# Patient Record
Sex: Male | Born: 1950
Health system: Southern US, Community
[De-identification: ages and names within clinical notes are randomized; demographics above are authoritative.]

## PROBLEM LIST (undated history)

## (undated) ENCOUNTER — Emergency Department: Discharge status: Absent without leave | Payer: Medicare (Managed Care)

## (undated) DIAGNOSIS — N189 Chronic kidney disease, unspecified: Secondary | ICD-10-CM

## (undated) DIAGNOSIS — L84 Corns and callosities: Secondary | ICD-10-CM

## (undated) DIAGNOSIS — E278 Other specified disorders of adrenal gland: Secondary | ICD-10-CM

## (undated) DIAGNOSIS — C61 Malignant neoplasm of prostate: Secondary | ICD-10-CM

## (undated) DIAGNOSIS — Z8619 Personal history of other infectious and parasitic diseases: Secondary | ICD-10-CM

## (undated) DIAGNOSIS — I10 Essential (primary) hypertension: Secondary | ICD-10-CM

## (undated) DIAGNOSIS — N182 Chronic kidney disease, stage 2 (mild): Secondary | ICD-10-CM

## (undated) DIAGNOSIS — E785 Hyperlipidemia, unspecified: Secondary | ICD-10-CM

## (undated) DIAGNOSIS — E119 Type 2 diabetes mellitus without complications: Secondary | ICD-10-CM

## (undated) DIAGNOSIS — E669 Obesity, unspecified: Secondary | ICD-10-CM

## (undated) DIAGNOSIS — E279 Disorder of adrenal gland, unspecified: Secondary | ICD-10-CM

## (undated) DIAGNOSIS — R319 Hematuria, unspecified: Secondary | ICD-10-CM

## (undated) DIAGNOSIS — R7303 Prediabetes: Secondary | ICD-10-CM

## (undated) DIAGNOSIS — R972 Elevated prostate specific antigen [PSA]: Secondary | ICD-10-CM

## (undated) DIAGNOSIS — R310 Gross hematuria: Secondary | ICD-10-CM

## (undated) HISTORY — DX: Other specified disorders of adrenal gland: E27.8

## (undated) HISTORY — DX: Gross hematuria: R31.0

## (undated) HISTORY — DX: Hyperlipidemia, unspecified: E78.5

## (undated) HISTORY — DX: Malignant neoplasm of prostate: C61

## (undated) HISTORY — PX: TONSILLECTOMY: SUR1361

## (undated) HISTORY — PX: ADENOIDECTOMY: SUR15

## (undated) HISTORY — DX: Prediabetes: R73.03

## (undated) HISTORY — DX: Elevated prostate specific antigen (PSA): R97.20

## (undated) HISTORY — DX: Chronic kidney disease, unspecified: N18.9

## (undated) HISTORY — DX: Essential (primary) hypertension: I10

## (undated) HISTORY — DX: Disorder of adrenal gland, unspecified: E27.9

## (undated) HISTORY — DX: Type 2 diabetes mellitus without complications: E11.9

## (undated) HISTORY — DX: Personal history of other infectious and parasitic diseases: Z86.19

## (undated) HISTORY — DX: Obesity, unspecified: E66.9

## (undated) HISTORY — DX: Chronic kidney disease, stage 2 (mild): N18.2

---
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
## 2004-01-01 ENCOUNTER — Other Ambulatory Visit: Payer: Self-pay

## 2005-01-21 ENCOUNTER — Emergency Department: Payer: Self-pay | Admitting: General Practice

## 2006-08-16 ENCOUNTER — Ambulatory Visit: Payer: Self-pay | Admitting: Gastroenterology

## 2014-04-19 HISTORY — PX: PROCTOSCOPY: SHX2266

## 2014-04-28 ENCOUNTER — Ambulatory Visit: Payer: Self-pay | Admitting: Obstetrics and Gynecology

## 2014-04-28 IMAGING — CT CT ABDOMEN AND PELVIS WITHOUT AND WITH CONTRAST
3 of 10 series · 13 of 46 positions shown, 19 images · IV contrast (isovue)
Comparison: None.

CLINICAL DATA: Gross hematuria.

EXAM:
CT ABDOMEN AND PELVIS WITHOUT AND WITH CONTRAST
TECHNIQUE: Multidetector CT imaging of the abdomen and pelvis was performed
following the standard protocol before and following the bolus
administration of intravenous contrast.
CONTRAST:  125 cc Isovue 370.

[Series 2: hematuria > 45 wo · axial · 0.80mm/px · z∈[-1002,-642]mm · 8 of 94 slices shown, 13 images]
[im 11/94  soft-tissue]
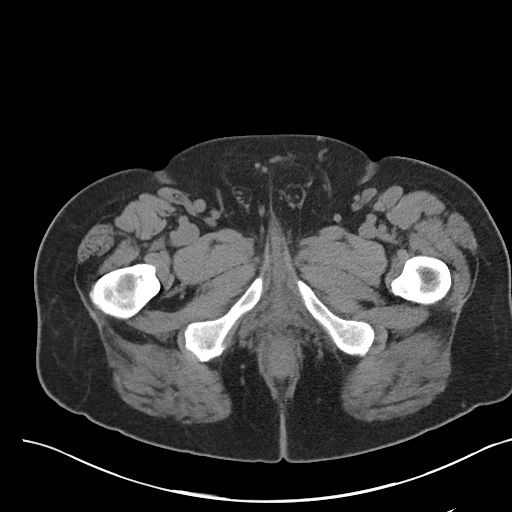
[im 11/94  bone]
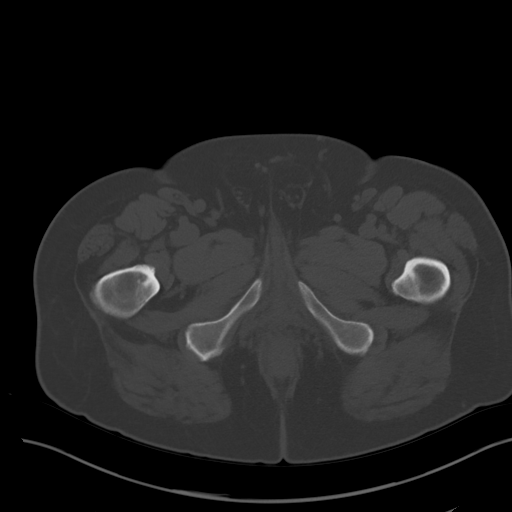
[im 21/94  soft-tissue]
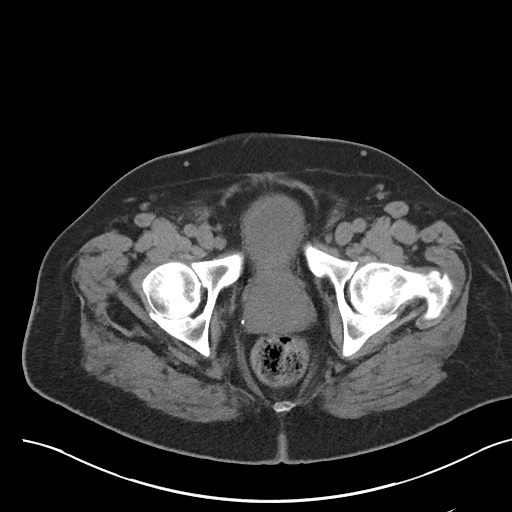
[im 32/94  soft-tissue]
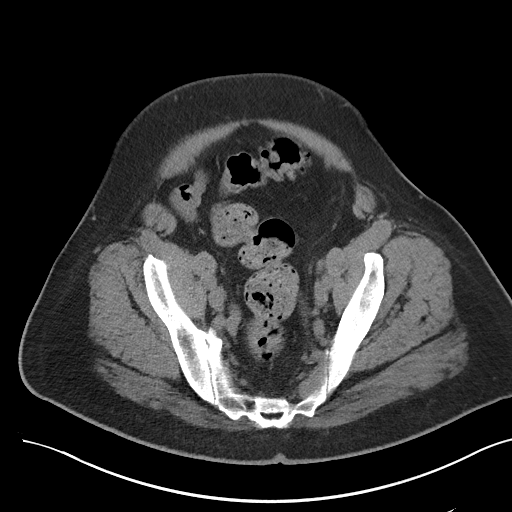
[im 42/94  soft-tissue]
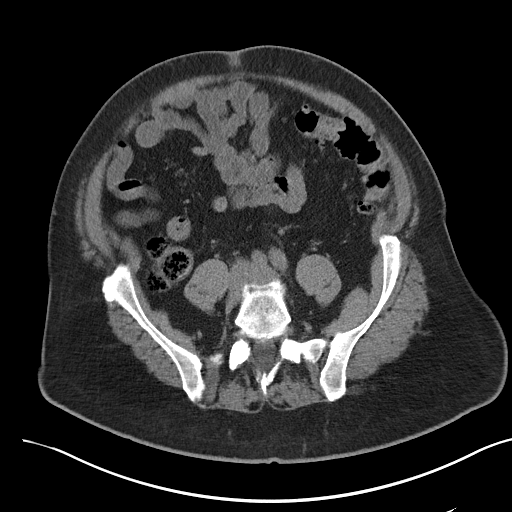
[im 52/94  soft-tissue]
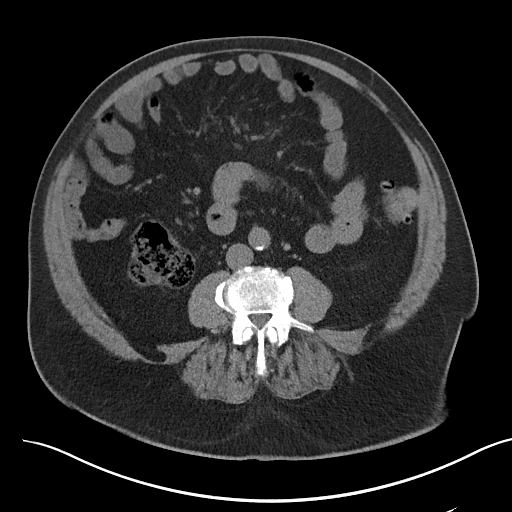
[im 52/94  lung]
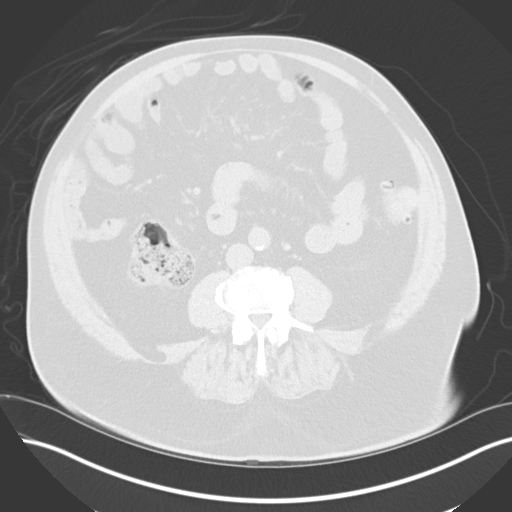
[im 63/94  soft-tissue]
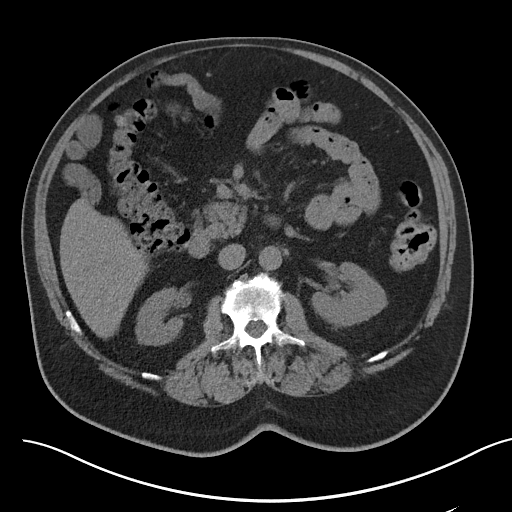
[im 63/94  lung]
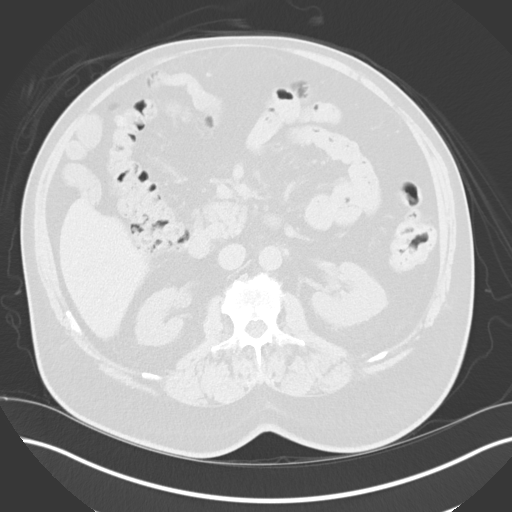
[im 73/94  soft-tissue]
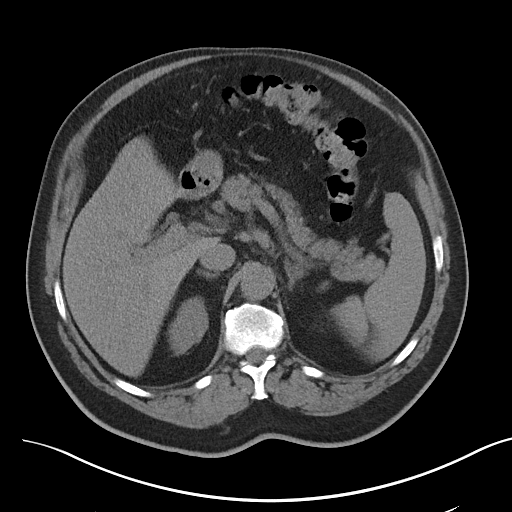
[im 73/94  lung]
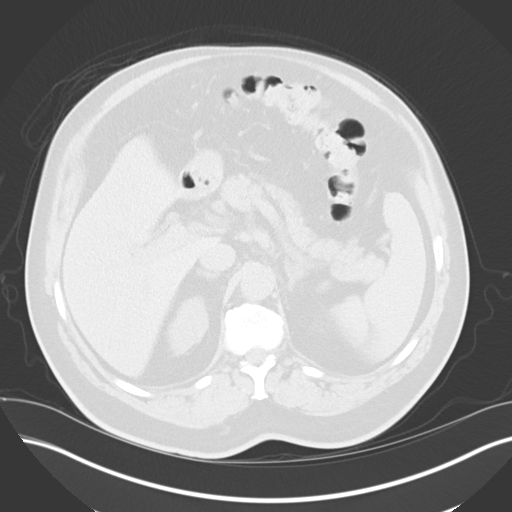
[im 83/94  soft-tissue]
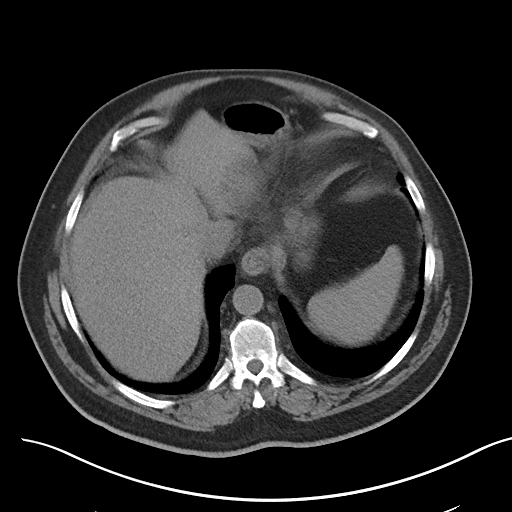
[im 83/94  lung]
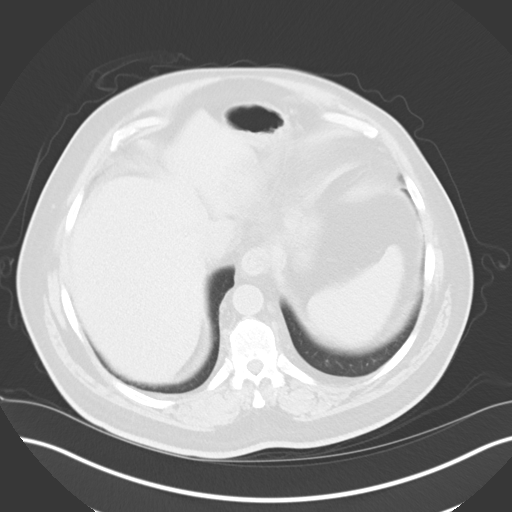

[Series 7: delay · axial · delayed · 0.98mm/px · z∈[-976,-866]mm · 3 of 100 slices shown]
[im 12/100  soft-tissue]
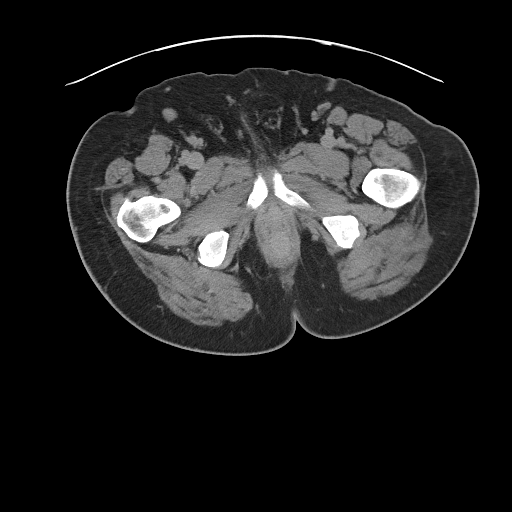
[im 23/100  soft-tissue]
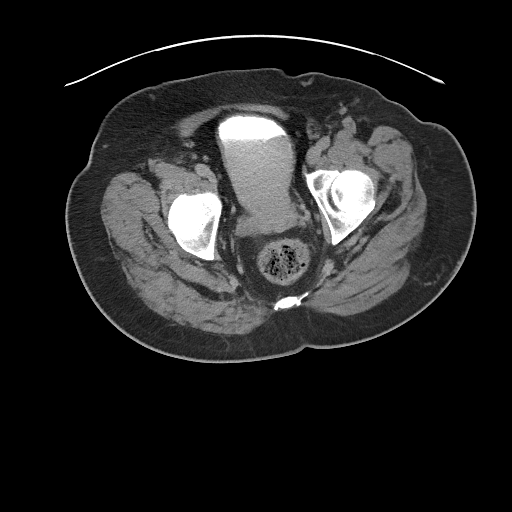
[im 34/100  soft-tissue]
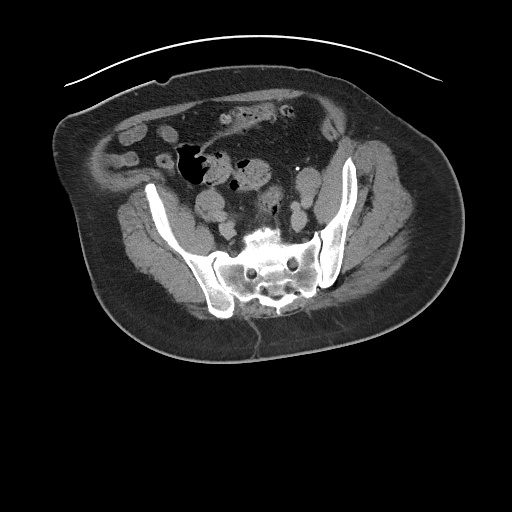

[Series 14: cor delay · coronal · delayed · 0.92mm/px · 2 of 191 slices shown, 3 images]
[im 64/191  soft-tissue]
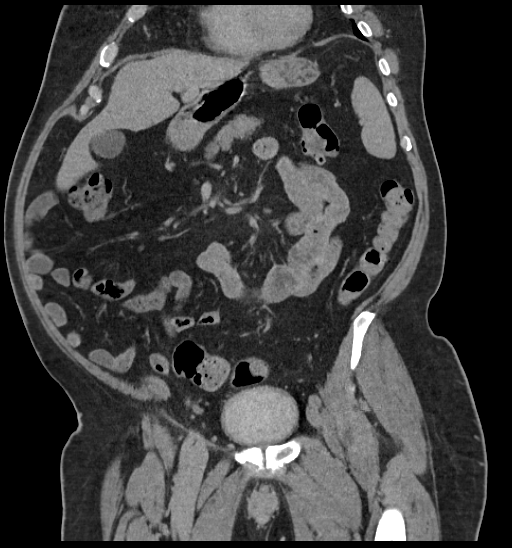
[im 64/191  bone]
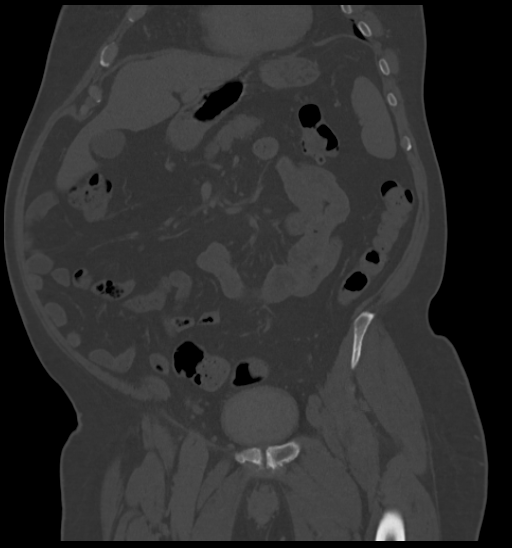
[im 127/191  soft-tissue]
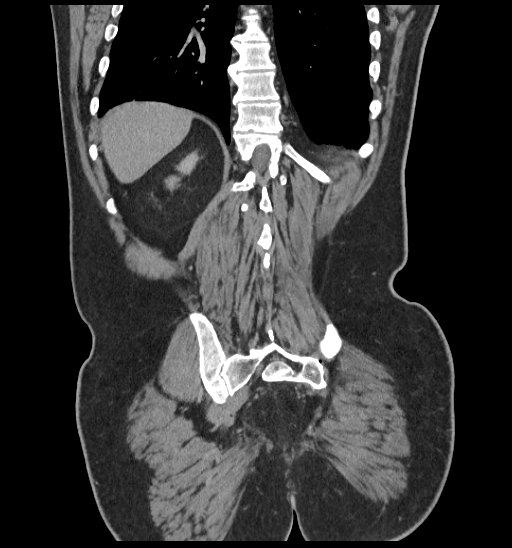

[13 of 46 positions shown; findings below may reference images not displayed]

FINDINGS: Lung bases: Lung bases show no acute findings. Heart size normal. No
pericardial or pleural effusion.

Kidneys / Ureters / Bladder: No urinary stones or hydronephrosis.
The majority of the right ureter and distal left ureter are poorly
opacified, limiting evaluation. Otherwise, no filling defects in the
renal collecting systems. Bladder is unremarkable.

Other: Millimetric low-attenuation lesions in the dome of the liver
too small to characterize but likely represent cysts. Liver is
otherwise unremarkable. Small stone layers in the gallbladder. Right
adrenal gland is unremarkable. A 0.8 x 1.2 cm nodule in the left
adrenal gland measures fluid in density on precontrast imaging.
Spleen, pancreas, stomach and bowel are unremarkable. Prostate is
mildly enlarged and indents the bladder base. Small bilateral
inguinal hernias contain fat. No pathologically enlarged lymph
nodes. No free fluid. Minimal scattered atherosclerotic
calcification of the arterial vasculature.

Bones / Musculoskeletal: No worrisome lytic or sclerotic lesions.
Degenerative changes are seen in the spine.
IMPRESSION: 1. Portions of the ureters are poorly opacified, limiting
evaluation. Otherwise, no findings to explain the patient's
hematuria.
2. Cholelithiasis.
3. Left adrenal adenoma.
4. Enlarged prostate.

## 2014-08-09 ENCOUNTER — Emergency Department: Payer: Self-pay | Admitting: Emergency Medicine

## 2014-08-09 LAB — CBC WITH DIFFERENTIAL/PLATELET
BASOS PCT: 0.4 %
Basophil #: 0.1 10*3/uL (ref 0.0–0.1)
Eosinophil #: 0.2 10*3/uL (ref 0.0–0.7)
Eosinophil %: 1.9 %
HCT: 41.7 % (ref 40.0–52.0)
HGB: 13.8 g/dL (ref 13.0–18.0)
Lymphocyte #: 2 10*3/uL (ref 1.0–3.6)
Lymphocyte %: 17.5 %
MCH: 29.5 pg (ref 26.0–34.0)
MCHC: 33.1 g/dL (ref 32.0–36.0)
MCV: 89 fL (ref 80–100)
MONOS PCT: 5.4 %
Monocyte #: 0.6 x10 3/mm (ref 0.2–1.0)
NEUTROS PCT: 74.8 %
Neutrophil #: 8.7 10*3/uL — ABNORMAL HIGH (ref 1.4–6.5)
PLATELETS: 222 10*3/uL (ref 150–440)
RBC: 4.68 10*6/uL (ref 4.40–5.90)
RDW: 13.5 % (ref 11.5–14.5)
WBC: 11.6 10*3/uL — AB (ref 3.8–10.6)

## 2014-08-09 LAB — URINALYSIS, COMPLETE
BACTERIA: NONE SEEN
RBC,UR: 84892 /HPF (ref 0–5)
SPECIFIC GRAVITY: 1.027 (ref 1.003–1.030)
SQUAMOUS EPITHELIAL: NONE SEEN
WBC UR: NONE SEEN /HPF (ref 0–5)

## 2014-11-18 ENCOUNTER — Ambulatory Visit: Payer: Self-pay | Admitting: Gastroenterology

## 2014-11-18 HISTORY — PX: COLONOSCOPY: SHX5424

## 2015-01-12 LAB — SURGICAL PATHOLOGY

## 2015-04-14 ENCOUNTER — Other Ambulatory Visit: Payer: Self-pay | Admitting: Family Medicine

## 2015-04-14 DIAGNOSIS — E119 Type 2 diabetes mellitus without complications: Secondary | ICD-10-CM

## 2015-05-06 ENCOUNTER — Encounter: Payer: Self-pay | Admitting: *Deleted

## 2015-05-08 ENCOUNTER — Ambulatory Visit (INDEPENDENT_AMBULATORY_CARE_PROVIDER_SITE_OTHER): Payer: Managed Care, Other (non HMO) | Admitting: Family Medicine

## 2015-05-08 ENCOUNTER — Encounter: Payer: Self-pay | Admitting: Family Medicine

## 2015-05-08 VITALS — BP 132/86 | HR 102 | Temp 97.8°F | Resp 18 | Ht 69.0 in | Wt 207.4 lb

## 2015-05-08 DIAGNOSIS — R972 Elevated prostate specific antigen [PSA]: Secondary | ICD-10-CM

## 2015-05-08 DIAGNOSIS — Z8619 Personal history of other infectious and parasitic diseases: Secondary | ICD-10-CM

## 2015-05-08 DIAGNOSIS — D3500 Benign neoplasm of unspecified adrenal gland: Secondary | ICD-10-CM | POA: Insufficient documentation

## 2015-05-08 DIAGNOSIS — N182 Chronic kidney disease, stage 2 (mild): Secondary | ICD-10-CM | POA: Insufficient documentation

## 2015-05-08 DIAGNOSIS — M25511 Pain in right shoulder: Secondary | ICD-10-CM | POA: Insufficient documentation

## 2015-05-08 DIAGNOSIS — R319 Hematuria, unspecified: Secondary | ICD-10-CM | POA: Insufficient documentation

## 2015-05-08 DIAGNOSIS — E785 Hyperlipidemia, unspecified: Secondary | ICD-10-CM

## 2015-05-08 DIAGNOSIS — E8881 Metabolic syndrome: Secondary | ICD-10-CM | POA: Diagnosis not present

## 2015-05-08 DIAGNOSIS — E669 Obesity, unspecified: Secondary | ICD-10-CM | POA: Diagnosis not present

## 2015-05-08 DIAGNOSIS — I10 Essential (primary) hypertension: Secondary | ICD-10-CM | POA: Diagnosis not present

## 2015-05-08 DIAGNOSIS — R809 Proteinuria, unspecified: Secondary | ICD-10-CM | POA: Insufficient documentation

## 2015-05-08 DIAGNOSIS — E663 Overweight: Secondary | ICD-10-CM | POA: Insufficient documentation

## 2015-05-08 DIAGNOSIS — E119 Type 2 diabetes mellitus without complications: Secondary | ICD-10-CM | POA: Diagnosis not present

## 2015-05-08 DIAGNOSIS — E66811 Obesity, class 1: Secondary | ICD-10-CM

## 2015-05-08 DIAGNOSIS — K625 Hemorrhage of anus and rectum: Secondary | ICD-10-CM | POA: Insufficient documentation

## 2015-05-08 HISTORY — DX: Hematuria, unspecified: R31.9

## 2015-05-08 HISTORY — DX: Personal history of other infectious and parasitic diseases: Z86.19

## 2015-05-08 LAB — POCT GLYCOSYLATED HEMOGLOBIN (HGB A1C): Hemoglobin A1C: 6.2

## 2015-05-08 MED ORDER — METFORMIN HCL 500 MG PO TABS: 500.0000 mg | ORAL_TABLET | Freq: Every day | ORAL | Status: DC

## 2015-05-08 NOTE — Progress Notes (Signed)
Name: SPIKE DESILETS   MRN: [id number]    DOB: 1951/02/28   Date:05/08/2015       Progress Note  Subjective  Chief Complaint  Chief Complaint  Patient presents with  . Diabetes    HPI   Patient is here for routine follow up of Diabetes Type II.  First diagnosed with DM Type II several years ago. Current diabetes medication regimen includes Metformin 500 mg one a day. Patient is taking medications as instructed. Overall the patient feels that their blood glucose is well controlled. Checking blood glucose zero times a day/week consistently/inconsistently. No symptoms of hypoglycemia are reported today. Fasting glucose range unknown. Meal time glucose range unknown. Lowest glucose result unknown. Related symptoms include none.  Related medical diagnosis include HLD, Obesity. The last retinal eye exam was unknown.  Hyperlipidemia: Patient presents with hyperlipidemia.  He was tested because co morbid conditions such as DM II, Obesity. There is a family history of hyperlipidemia. There is not a family history of early ischemia heart disease.  He is not on statin therapy as he is working on lifestyle changes.  Hypertension: Patient here for follow-up of elevated blood pressure. He is not exercising and is adherent to low salt diet.  Blood pressure is well controlled at home. Cardiac symptoms none. Patient denies chest pain, claudication, exertional chest pressure/discomfort, irregular heart beat, lower extremity edema and palpitations.  Cardiovascular risk factors: advanced age (older than 54 for men, 37 for women), diabetes mellitus, hypertension, male gender, microalbuminuria, obesity (BMI >= 30 kg/m2) and sedentary lifestyle. Use of agents associated with hypertension: NSAIDS. History of target organ damage: chronic kidney disease.   Patient is here for routine follow up of Obesity. The patient is appropriately concerned about their weight. Onset of weight issues started many years ago. Highest  recorded weight 210 lbs. Associated symptoms or diseases do not include pain in weight bearing joints, HLD, skin lesions, depression, poor self esteem, snoring, sleep apnea. Current efforts to reduce weight include low fat, low carbohydrate, low sugar diet along with daily exercise.  Patient is consistent with recommended lifestyle changes.     Past Medical History  Diagnosis Date  . Borderline diabetes   . Obesity   . HTN (hypertension)   . Elevated PSA   . Gross hematuria   . Prostate cancer   . Adrenal nodule   . Diabetes mellitus without complication   . Hyperlipidemia   . Chronic kidney disease   . History of hepatitis B   . CKD (chronic kidney disease), stage II   . HTN, goal below 150/90   . Hyperlipidemia with target LDL less than 100     Past Surgical History  Procedure Laterality Date  . Adenoidectomy    . Tonsillectomy    . Proctoscopy  04/2014  . Colonoscopy  11/18/2014    Family History  Problem Relation Age of Onset  . Parkinson's disease Brother   . Heart disease Mother   . Heart disease Father   . Diabetes Mellitus II Mother   . Coronary artery disease Mother     Social History   Social History  . Marital Status: Single    Spouse Name: N/A  . Number of Children: N/A  . Years of Education: N/A   Occupational History  . Not on file.   Social History Main Topics  . Smoking status: Never Smoker   . Smokeless tobacco: Not on file  . Alcohol Use: No  . Drug Use: Not  on file  . Sexual Activity: No   Other Topics Concern  . Not on file   Social History Narrative     Current outpatient prescriptions:  .  metFORMIN (GLUCOPHAGE) 500 MG tablet, Take 1 tablet (500 mg total) by mouth daily., Disp: 90 tablet, Rfl: 3  No Known Allergies   ROS  CONSTITUTIONAL: No significant weight changes, fever, chills, weakness or fatigue.  HEENT:  - Eyes: No visual changes.  - Ears: No auditory changes. No pain.  - Nose: No sneezing, congestion, runny  nose. - Throat: No sore throat. No changes in swallowing. SKIN: No rash or itching.  CARDIOVASCULAR: No chest pain, chest pressure or chest discomfort. No palpitations or edema.  RESPIRATORY: No shortness of breath, cough or sputum.  GASTROINTESTINAL: No anorexia, nausea, vomiting. No changes in bowel habits. No abdominal pain or blood with bowel movements.  GENITOURINARY: No dysuria. No frequency. No discharge.  NEUROLOGICAL: No headache, dizziness, syncope, paralysis, ataxia.  No numbness, pain or tingling in the extremities. No memory changes. No change in bowel or bladder control.  MUSCULOSKELETAL: No joint pain. No muscle pain. HEMATOLOGIC: No anemia, bleeding or bruising.  LYMPHATICS: No enlarged lymph nodes.  PSYCHIATRIC: No change in mood. No change in sleep pattern.  ENDOCRINOLOGIC: No reports of sweating, cold or heat intolerance. No polyuria or polydipsia.   Objective  Filed Vitals:   05/08/15 1553  BP: 132/86  Pulse: 102  Temp: 97.8 F (36.6 C)  TempSrc: Oral  Resp: 18  Height: _0  (1.753 m)  Weight: 207 lb 6.4 oz (94.076 kg)  SpO2: 97%   Body mass index is 30.61 kg/(m^2).  Physical Exam  Constitutional: Patient is obese and well-nourished. In no distress.  HEENT:  - Head: Normocephalic and atraumatic.  - Ears: Bilateral TMs gray, no erythema or effusion - Nose: Nasal mucosa moist - Mouth/Throat: Oropharynx is clear and moist. No tonsillar hypertrophy or erythema. No post nasal drainage. Poor dentition. - Eyes: Conjunctivae clear, EOM movements normal. PERRLA. No scleral icterus.  Neck: Normal range of motion. Neck supple. No JVD present. No thyromegaly present.  Cardiovascular: Normal rate, regular rhythm and normal heart sounds.  No murmur heard.  Pulmonary/Chest: Effort normal and breath sounds normal. No respiratory distress. Abdomen: Soft and obese with waist circumference over 40 inches (male), non tender, bowel sounds normal in all quadrants, difficult  examining internal organs due to body habitus Musculoskeletal: Normal range of motion bilateral UE and LE, no joint effusions. Peripheral vascular: Bilateral LE no edema. Neurological: CN II-XII grossly intact with no focal deficits. Alert and oriented to person, place, and time. Coordination, balance, strength, speech and gait are normal.  Skin: Skin is warm and dry. No rash noted. No erythema.  Psychiatric: Patient has a normal mood and affect. Behavior is normal in office today. Judgment and thought content normal in office today.   Diabetic Foot Exam - Simple   Simple Foot Form  Diabetic Foot exam was performed with the following findings:  Yes 05/08/2015  4:27 PM  Visual Inspection  See comments:  Yes  Sensation Testing  Intact to touch and monofilament testing bilaterally:  Yes  Pulse Check  Posterior Tibialis and Dorsalis pulse intact bilaterally:  Yes  Comments  Thick rigid long nails with lateral deviation of toes. No ulcerations or calluses.      Results for orders placed or performed in visit on 05/08/15 (from the past 48 hour(s))  POCT glycosylated hemoglobin (Hb A1C)  Status: Normal   Collection Time: 05/08/15  4:03 PM  Result Value Ref Range   Hemoglobin A1C 6.2     Assessment & Plan  1. Diabetes mellitus type 2, controlled, without complications Patient's EAT3V goal is <6.5% for stringent control, goal met today.  Encouraged patient to continue efforts on checking blood glucose on a daily basis. Fasting blood glucose control goal is 80-137m/dL and post prandial blood glucose control is <18107mdL.  Reviewed diet, exercise, lifestyle changes and current medication regimen pertaining to diabetes with the patient.   Reminded patient of the required annual dilated retinal exam. Unable to perform urine micro albumin today due to being out of stock.  - POCT glycosylated hemoglobin (Hb A1C) - Ambulatory referral to Ophthalmology - metFORMIN (GLUCOPHAGE) 500 MG  tablet; Take 1 tablet (500 mg total) by mouth daily.  Dispense: 90 tablet; Refill: 3  2. Hypertension, goal below 140/90 Clinically stable findings based on clinical exam and on review of any pertinent results. Recommended to patient that they continue their current regimen with regular follow ups.   3. Metabolic syndrome Metabolic syndrome as evidenced by meeting criteria outlined by NCEP ATP III definition, metabolic syndrome is present if three or more of the following five criteria are met: waist circumference over 40 inches (men) or 35 inches (women), blood pressure over 130/85 mmHg, fasting triglyceride (TG) level over 150 mg/dl, fasting high-density lipoprotein (HDL) cholesterol level less than 40 mg/dl (men) or 50 mg/dl (women) and fasting blood sugar over 100 mg/dl.   4. Abnormal prostate specific antigen Negative for malignancy thus far, due to financial restrictions patient has decided to not follow up with Urology but will surveillance PSA with PCP office and if there is a change will see specialist.   5. Obesity, Class I, BMI 30-34.9 The patient has been counseled on their higher than normal BMI.  They have verbally expressed understanding their increased risk for other diseases.  In efforts to meet a better target BMI goal the patient has been counseled on lifestyle, diet and exercise modification tactics. Start with moderate intensity aerobic exercise (walking, jogging, elliptical, swimming, group or individual sports, hiking) at least 306m a day at least 4 days a week and increase intensity, duration, frequency as tolerated. Diet should include well balance fresh fruits and vegetables avoiding processed foods, carbohydrates and sugars. Drink at least 8oz 10 glasses a day avoiding sodas, sugary fruit drinks, sweetened tea. Check weight on a reliable scale daily and monitor weight loss progress daily. Consider investing in mobile phone apps that will help keep track of weight loss  goals.   6. Hyperlipidemia LDL goal <100 Reviewed most recent fasting lipid profile blood work. Discussed cholesterol goals with patient. Discussed treatment options including diet, exercise, lifestyle changes, statin therapy, fenofibrate, bile acid resins, omega3 fatty acids. Patient educated on long term consequences of uncontrolled hyperlipidemia.

## 2015-05-08 NOTE — Progress Notes (Deleted)
Name: Scott Woods   MRN: [id number]    DOB: Feb 11, 1951   Date:05/08/2015       Progress Note  Subjective  Chief Complaint  No chief complaint on file.   HPI  ***  Patient Active Problem List   Diagnosis Date Noted  . Adrenal adenoma 05/08/2015  . Chronic kidney disease (CKD), stage II (mild) 05/08/2015  . Abnormal prostate specific antigen 05/08/2015  . Blood in the urine 05/08/2015  . H/O type B viral hepatitis 05/08/2015  . Hyperlipidemia LDL goal <100 05/08/2015  . Hypertension, goal below 140/90 05/08/2015  . Abnormal presence of protein in urine 05/08/2015  . Bleeding per rectum 05/08/2015  . Diabetes mellitus type 2, controlled, without complications [id number]  . Obesity, Class I, BMI 30-34.9 05/08/2015    Social History  Substance Use Topics  . Smoking status: Never Smoker   . Smokeless tobacco: Not on file  . Alcohol Use: No     Current outpatient prescriptions:  .  metFORMIN (GLUCOPHAGE) 500 MG tablet, TAKE 1 TABLET BY MOUTH EVERY DAY, Disp: 30 tablet, Rfl: 2  Past Surgical History  Procedure Laterality Date  . Adenoidectomy    . Tonsillectomy      Family History  Problem Relation Age of Onset  . Parkinson's disease Brother   . Heart disease Mother   . Heart disease Father   . Diabetes Mellitus II Mother   . Coronary artery disease Mother     Allergies no known allergies   Review of Systems  CONSTITUTIONAL: No significant weight changes, fever, chills, weakness or fatigue.  HEENT:  - Eyes: No visual changes.  - Ears: No auditory changes. No pain.  - Nose: No sneezing, congestion, runny nose. - Throat: No sore throat. No changes in swallowing. SKIN: No rash or itching.  CARDIOVASCULAR: No chest pain, chest pressure or chest discomfort. No palpitations or edema.  RESPIRATORY: No shortness of breath, cough or sputum.  GASTROINTESTINAL: No anorexia, nausea, vomiting. No changes in bowel habits. No abdominal pain or blood.  GENITOURINARY: No  dysuria. No frequency. No discharge. *** NEUROLOGICAL: No headache, dizziness, syncope, paralysis, ataxia, numbness or tingling in the extremities. No memory changes. No change in bowel or bladder control.  MUSCULOSKELETAL: No joint pain. No muscle pain. HEMATOLOGIC: No anemia, bleeding or bruising.  LYMPHATICS: No enlarged lymph nodes.  PSYCHIATRIC: No change in mood. No change in sleep pattern.  ENDOCRINOLOGIC: No reports of sweating, cold or heat intolerance. No polyuria or polydipsia.     Objective  There were no vitals taken for this visit. There is no height or weight on file to calculate BMI.  Physical Exam  Constitutional: Patient appears well-developed and well-nourished. In no distress.  HEENT:  - Head: Normocephalic and atraumatic.  - Ears: Bilateral TMs gray, no erythema or effusion - Nose: Nasal mucosa moist - Mouth/Throat: Oropharynx is clear and moist. No tonsillar hypertrophy or erythema. No post nasal drainage.  - Eyes: Conjunctivae clear, EOM movements normal. PERRLA. No scleral icterus.  Neck: Normal range of motion. Neck supple. No JVD present. No thyromegaly present.  Cardiovascular: Normal rate, regular rhythm and normal heart sounds.  No murmur heard.  Pulmonary/Chest: Effort normal and breath sounds normal. No respiratory distress. Musculoskeletal: Normal range of motion bilateral UE and LE, no joint effusions. Peripheral vascular: Bilateral LE no edema. Neurological: CN II-XII grossly intact with no focal deficits. Alert and oriented to person, place, and time. Coordination, balance, strength, speech and gait are normal.  Skin: Skin is warm and dry. No rash noted. No erythema.  Psychiatric: Patient has a normal mood and affect. Behavior is normal in office today. Judgment and thought content normal in office today.   No results found for this or any previous visit (from the past 2160 hour(s)).   Assessment & Plan

## 2015-05-11 ENCOUNTER — Other Ambulatory Visit: Payer: Self-pay

## 2015-05-14 ENCOUNTER — Ambulatory Visit: Payer: Self-pay | Admitting: Urology

## 2015-09-15 ENCOUNTER — Ambulatory Visit: Payer: Managed Care, Other (non HMO) | Admitting: Family Medicine

## 2015-10-09 ENCOUNTER — Ambulatory Visit (INDEPENDENT_AMBULATORY_CARE_PROVIDER_SITE_OTHER): Payer: Managed Care, Other (non HMO) | Admitting: Family Medicine

## 2015-10-09 ENCOUNTER — Encounter: Payer: Self-pay | Admitting: Family Medicine

## 2015-10-09 VITALS — BP 118/74 | HR 90 | Temp 98.3°F | Resp 16 | Ht 69.0 in | Wt 208.9 lb

## 2015-10-09 DIAGNOSIS — E8881 Metabolic syndrome: Secondary | ICD-10-CM | POA: Insufficient documentation

## 2015-10-09 DIAGNOSIS — E119 Type 2 diabetes mellitus without complications: Secondary | ICD-10-CM | POA: Diagnosis not present

## 2015-10-09 DIAGNOSIS — I10 Essential (primary) hypertension: Secondary | ICD-10-CM | POA: Diagnosis not present

## 2015-10-09 LAB — POCT UA - MICROALBUMIN: MICROALBUMIN (UR) POC: NEGATIVE mg/L

## 2015-10-09 LAB — POCT GLYCOSYLATED HEMOGLOBIN (HGB A1C): HEMOGLOBIN A1C: 6.2

## 2015-10-09 MED ORDER — METFORMIN HCL 500 MG PO TABS: 500.0000 mg | ORAL_TABLET | Freq: Every day | ORAL | Status: DC

## 2015-10-09 NOTE — Progress Notes (Signed)
Name: Scott Woods   MRN: [id number]    DOB: Feb 01, 1951   Date:10/09/2015       Progress Note  Subjective  Chief Complaint  Chief Complaint  Patient presents with  . Medication Refill  . Diabetes    HPI  Clell Schwaderer is a 65 year old male with HTN, HLD, DM II, elevated PSA, CKD stage II. Here for routine f/u of chronic conditions.  Patient is here for routine follow up of Diabetes Type II.  First diagnosed with DM Type II several years ago. Current diabetes medication regimen includes Metformin 500 mg one a day. Patient is taking medications as instructed. Overall the patient feels that their blood glucose is well controlled. Checking blood glucose zero times a day/week consistently/inconsistently. No symptoms of hypoglycemia are reported today. Fasting glucose range unknown. Meal time glucose range unknown. Lowest glucose result unknown. Related symptoms include none. Related medical diagnosis include HLD, Obesity. The last retinal eye exam was unknown.  Hyperlipidemia: Patient presents with hyperlipidemia. He was tested because co morbid conditions such as DM II, Obesity. There is a family history of hyperlipidemia. There is not a family history of early ischemia heart disease. He is not on statin therapy as he is working on lifestyle changes.  Hypertension: Patient here for follow-up of elevated blood pressure. He is not exercising and is adherent to low salt diet. Blood pressure is well controlled at home. Cardiac symptoms none. Patient denies chest pain, claudication, exertional chest pressure/discomfort, irregular heart beat, lower extremity edema and palpitations. Cardiovascular risk factors: advanced age (older than 74 for men, 65 for women), diabetes mellitus, hypertension, male gender, microalbuminuria, obesity (BMI >= 30 kg/m2) and sedentary lifestyle. Use of agents associated with hypertension: NSAIDS. History of target organ damage: chronic kidney disease.  Past Medical  History  Diagnosis Date  . Borderline diabetes   . Obesity   . HTN (hypertension)   . Elevated PSA   . Gross hematuria   . Prostate cancer ([place])   . Adrenal nodule ([place])   . Diabetes mellitus without complication ([place])   . Hyperlipidemia   . Chronic kidney disease   . History of hepatitis B   . CKD (chronic kidney disease), stage II   . HTN, goal below 150/90   . Hyperlipidemia with target LDL less than 100     Patient Active Problem List   Diagnosis Date Noted  . Adrenal adenoma 05/08/2015  . Chronic kidney disease (CKD), stage II (mild) 05/08/2015  . Abnormal prostate specific antigen 05/08/2015  . Blood in the urine 05/08/2015  . H/O type B viral hepatitis 05/08/2015  . Hyperlipidemia LDL goal <100 05/08/2015  . Hypertension, goal below 140/90 05/08/2015  . Abnormal presence of protein in urine 05/08/2015  . Bleeding per rectum 05/08/2015  . Diabetes mellitus type 2, controlled, without complications ([place]) [id number]  . Obesity, Class I, BMI 30-34.9 05/08/2015  . BP (high blood pressure) 05/08/2015  . Adiposity 05/08/2015  . Diabetes mellitus type II, controlled ([place]) 05/08/2015  . Right shoulder pain 05/08/2015    Social History  Substance Use Topics  . Smoking status: Never Smoker   . Smokeless tobacco: Not on file  . Alcohol Use: No     Current outpatient prescriptions:  .  metFORMIN (GLUCOPHAGE) 500 MG tablet, Take 1 tablet (500 mg total) by mouth daily., Disp: 90 tablet, Rfl: 3  Past Surgical History  Procedure Laterality Date  . Adenoidectomy    . Tonsillectomy    .  Proctoscopy  04/2014  . Colonoscopy  11/18/2014    Family History  Problem Relation Age of Onset  . Parkinson's disease Brother   . Heart disease Mother   . Heart disease Father   . Diabetes Mellitus II Mother   . Coronary artery disease Mother     No Known Allergies   Review of Systems  CONSTITUTIONAL: No significant weight changes, fever, chills, weakness or fatigue.   CARDIOVASCULAR: No chest pain, chest pressure or chest discomfort. No palpitations or edema.  RESPIRATORY: No shortness of breath, cough or sputum.  GASTROINTESTINAL: No anorexia, nausea, vomiting. No changes in bowel habits. No abdominal pain or blood.  GENITOURINARY: No dysuria. No frequency. No discharge.  NEUROLOGICAL: No headache, dizziness, syncope, paralysis, ataxia, numbness or tingling in the extremities. No memory changes. No change in bowel or bladder control.  MUSCULOSKELETAL: No joint pain. No muscle pain. HEMATOLOGIC: No anemia, bleeding or bruising.  LYMPHATICS: No enlarged lymph nodes.  PSYCHIATRIC: No change in mood. No change in sleep pattern.  ENDOCRINOLOGIC: No reports of sweating, cold or heat intolerance. No polyuria or polydipsia.     Objective  BP 118/74 mmHg  Pulse 90  Temp(Src) 98.3 F (36.8 C) (Oral)  Resp 16  Ht 5\' 9"  (1.753 m)  Wt 208 lb 14.4 oz (94.756 kg)  BMI 30.83 kg/m2  SpO2 97% Body mass index is 30.83 kg/(m^2).  Physical Exam  Constitutional: Patient appears overweight and well-nourished. In no distress.  HEENT:  - Head: Normocephalic and atraumatic.  - Ears: Bilateral TMs gray, no erythema or effusion - Nose: Nasal mucosa moist - Mouth/Throat: Oropharynx is clear and moist. No tonsillar hypertrophy or erythema. No post nasal drainage. Poor dentition.  - Eyes: Conjunctivae clear, EOM movements normal. PERRLA. No scleral icterus.  Neck: Normal range of motion. Neck supple. No JVD present. No thyromegaly present.  Cardiovascular: Normal rate, regular rhythm and normal heart sounds.  No murmur heard.  Pulmonary/Chest: Effort normal and breath sounds normal. No respiratory distress. Musculoskeletal: Normal range of motion bilateral UE and LE, no joint effusions. Peripheral vascular: Bilateral LE no edema. Neurological: CN II-XII grossly intact with no focal deficits. Alert and oriented to person, place, and time. Coordination, balance,  strength, speech and gait are normal.  Skin: Skin is warm and dry. No rash noted. No erythema.  Psychiatric: Patient has a normal mood and affect. Behavior is normal in office today. Judgment and thought content normal in office today.  Diabetic Foot Exam - Simple   Simple Foot Form  Diabetic Foot exam was performed with the following findings:  Yes 10/09/2015  4:32 PM  Visual Inspection  No deformities, no ulcerations, no other skin breakdown bilaterally:  Yes  Sensation Testing  Intact to touch and monofilament testing bilaterally:  Yes  Pulse Check  Posterior Tibialis and Dorsalis pulse intact bilaterally:  Yes  Comments       Results for orders placed or performed in visit on 10/09/15 (from the past 24 hour(s))  POCT UA - Microalbumin     Status: Normal   Collection Time: 10/09/15  4:20 PM  Result Value Ref Range   Microalbumin Ur, POC neg mg/L   Creatinine, POC  mg/dL   Albumin/Creatinine Ratio, Urine, POC    POCT HgB A1C     Status: Normal   Collection Time: 10/09/15  4:24 PM  Result Value Ref Range   Hemoglobin A1C 6.2     Assessment & Plan  1. Controlled type 2 diabetes  mellitus without complication, without long-term current use of insulin ([place]) At goal.   Patient's Hba1c goal is <6.5% for stringent control.  Encouraged patient to continue efforts with diet, exercise, weight management. Reviewed diet, exercise, lifestyle changes and current medication regimen pertaining to diabetes with the patient.   Reminded patient of the required annual dilated retinal exam.     - POCT UA - Microalbumin - POCT HgB A1C  2. Hypertension, goal below 140/90 Well controled with lifestyle changes.   3. Metabolic syndrome Continue working on weight loss.  - metFORMIN (GLUCOPHAGE) 500 MG tablet; Take 1 tablet (500 mg total) by mouth daily.  Dispense: 90 tablet; Refill: 3

## 2016-03-28 ENCOUNTER — Encounter: Payer: Self-pay | Admitting: Family Medicine

## 2016-03-28 ENCOUNTER — Ambulatory Visit (INDEPENDENT_AMBULATORY_CARE_PROVIDER_SITE_OTHER): Payer: Managed Care, Other (non HMO) | Admitting: Family Medicine

## 2016-03-28 VITALS — BP 122/72 | HR 92 | Temp 98.6°F | Resp 14 | Wt 213.4 lb

## 2016-03-28 DIAGNOSIS — E785 Hyperlipidemia, unspecified: Secondary | ICD-10-CM

## 2016-03-28 DIAGNOSIS — E669 Obesity, unspecified: Secondary | ICD-10-CM | POA: Diagnosis not present

## 2016-03-28 DIAGNOSIS — E119 Type 2 diabetes mellitus without complications: Secondary | ICD-10-CM

## 2016-03-28 DIAGNOSIS — Z5181 Encounter for therapeutic drug level monitoring: Secondary | ICD-10-CM | POA: Diagnosis not present

## 2016-03-28 DIAGNOSIS — L84 Corns and callosities: Secondary | ICD-10-CM

## 2016-03-28 DIAGNOSIS — N182 Chronic kidney disease, stage 2 (mild): Secondary | ICD-10-CM

## 2016-03-28 DIAGNOSIS — I10 Essential (primary) hypertension: Secondary | ICD-10-CM

## 2016-03-28 HISTORY — DX: Corns and callosities: L84

## 2016-03-28 NOTE — Patient Instructions (Addendum)
Return on Thursday afternoon for fasting labs Okay to have water and calorie-free beverages during the day on Thursday Try to limit saturated fats in your diet (bologna, hot dogs, barbeque, cheeseburgers, hamburgers, steak, bacon, sausage, cheese, etc.) and get more fresh fruits, vegetables, and whole grains Please do see your eye doctor regularly, and have your eyes examined every year (or more often per his or her recommendation) Check your feet every night and let me know right away of any sores, infections, numbness, etc. Try to limit sweets, white bread, white rice, white potatoes It is okay with me for you to not check your fingerstick blood sugars (per [employer] of Endocrinology Best Practices), unless you are interested and feel it would be helpful for you Start a baby 81 mg aspirin daily; make it says "coated" to help protect your stomach Try to lose 10 pounds over the next 3 months Check out the information at familydoctor.org entitled "Nutrition for Weight Loss: What You Need to Know about Fad Diets" Try to lose between 1-2 pounds per week by taking in fewer calories and burning off more calories You can succeed by limiting portions, limiting foods dense in calories and fat, becoming more active, and drinking 8 glasses of water a day (64 ounces) Don't skip meals, especially breakfast, as skipping meals may alter your metabolism Do not use over-the-counter weight loss pills or gimmicks that claim rapid weight loss A healthy BMI (or body mass index) is between 18.5 and 24.9 You can calculate your ideal BMI at the [employer] website [employer]

## 2016-03-28 NOTE — Progress Notes (Signed)
BP 122/72 mmHg  Pulse 92  Temp(Src) 98.6 F (37 C) (Oral)  Resp 14  Wt 213 lb 6.4 oz (96.798 kg)  SpO2 97%   Subjective:    Patient ID: Scott Woods, male    DOB: 1951-03-16, 66 y.o.   MRN: [id number]  HPI: Scott Woods is a 65 y.o. male  Chief Complaint  Patient presents with  . Follow-up    DM   Patient is here for follow-up today, but he is new to me; his usual provider left the practice He has Type 2 diabetes; taking metformin; not checking blood sugars; last eye exam was one year ago per his report; no reported problems with his feet except for calluses  He is not on a statin; eating bacon and sausage and egg and cheese biscuits for breakfast, not much oatmeal or cheerios  Obesity; highest weight was 232 pounds; not working out  Depression screen [facility] 2/9 03/28/2016 10/09/2015 05/08/2015  Decreased Interest 0 0 0  Down, Depressed, Hopeless 0 0 0  PHQ - 2 Score 0 0 0   Relevant past medical, surgical, family and social history reviewed Past Medical History  Diagnosis Date  . Borderline diabetes   . Obesity   . HTN (hypertension)   . Elevated PSA   . Gross hematuria   . Prostate cancer ([place])   . Adrenal nodule ([place])   . Diabetes mellitus without complication ([place])   . Hyperlipidemia   . Chronic kidney disease   . History of hepatitis B   . CKD (chronic kidney disease), stage II   . HTN, goal below 150/90   . Hyperlipidemia with target LDL less than 100    Past Surgical History  Procedure Laterality Date  . Adenoidectomy    . Tonsillectomy    . Proctoscopy  04/2014  . Colonoscopy  11/18/2014   Family History  Problem Relation Age of Onset  . Parkinson's disease Brother   . Heart disease Mother   . Heart disease Father   . Diabetes Mellitus II Mother   . Coronary artery disease Mother   Twin brother died of heart disease  Social History  Substance Use Topics  . Smoking status: Never Smoker   . Smokeless tobacco: None  . Alcohol Use: No    Interim medical history since last visit reviewed. Allergies and medications reviewed  Review of Systems Per HPI unless specifically indicated above     Objective:    BP 122/72 mmHg  Pulse 92  Temp(Src) 98.6 F (37 C) (Oral)  Resp 14  Wt 213 lb 6.4 oz (96.798 kg)  SpO2 97%  Wt Readings from Last 3 Encounters:  03/28/16 213 lb 6.4 oz (96.798 kg)  10/09/15 208 lb 14.4 oz (94.756 kg)  05/08/15 207 lb 6.4 oz (94.076 kg)   body mass index is 31.5 kg/(m^2).  Physical Exam  Constitutional: He appears well-developed and well-nourished. No distress.  HENT:  Head: Normocephalic and atraumatic.  Eyes: EOM are normal. No scleral icterus.  Neck: No thyromegaly present.  Cardiovascular: Normal rate and regular rhythm.   Pulmonary/Chest: Effort normal and breath sounds normal.  Abdominal: Soft. Bowel sounds are normal. He exhibits no distension.  Musculoskeletal: He exhibits no edema.  Neurological: Coordination normal.  Skin: Skin is warm and Woods. No pallor.  Thick hard callus left great toe, pared down with #10 blade  Psychiatric: He has a normal mood and affect. His behavior is normal. Judgment and thought content normal.  Diabetic Foot Form - Detailed   Diabetic Foot Exam - detailed  Diabetic Foot exam was performed with the following findings:  Yes 03/28/2016  4:17 PM  Visual Foot Exam completed.:  Yes    Pulse Foot Exam completed.:  Yes  Right Dorsalis Pedis:  Present Left Dorsalis Pedis:  Present  Semmes-Weinstein Monofilament Test  R Site 1-Great Toe:  Pos L Site 1-Great Toe:  Pos  R Site 4:  Pos L Site 4:  Pos  R Site 5:  Pos L Site 5:  Pos    Comments:  Callus great toe     Results for orders placed or performed in visit on 03/28/16  Hemoglobin A1c  Result Value Ref Range   Hgb A1c MFr Bld 6.4 (H) <5.7 %   Mean Plasma Glucose 137 mg/dL  Lipid panel  Result Value Ref Range   Cholesterol 179 125 - 200 mg/dL   Triglycerides 77 <150 mg/dL   HDL 50 >=40 mg/dL    Total CHOL/HDL Ratio 3.6 <=5.0 Ratio   VLDL 15 <30 mg/dL   LDL Cholesterol 114 <130 mg/dL  Comprehensive metabolic panel  Result Value Ref Range   Sodium 141 135 - 146 mmol/L   Potassium 4.7 3.5 - 5.3 mmol/L   Chloride 106 98 - 110 mmol/L   CO2 22 20 - 31 mmol/L   Glucose, Bld 111 (H) 65 - 99 mg/dL   BUN 26 (H) 7 - 25 mg/dL   Creat 1.39 (H) 0.70 - 1.25 mg/dL   Total Bilirubin 0.9 0.2 - 1.2 mg/dL   Alkaline Phosphatase 74 40 - 115 U/L   AST 18 10 - 35 U/L   ALT 16 9 - 46 U/L   Total Protein 6.6 6.1 - 8.1 g/dL   Albumin 4.0 3.6 - 5.1 g/dL   Calcium 8.8 8.6 - 10.3 mg/dL  CBC with Differential/Platelet  Result Value Ref Range   WBC 8.9 3.8 - 10.8 K/uL   RBC 4.68 4.20 - 5.80 MIL/uL   Hemoglobin 13.9 13.2 - 17.1 g/dL   HCT 41.0 38.5 - 50.0 %   MCV 87.6 80.0 - 100.0 fL   MCH 29.7 27.0 - 33.0 pg   MCHC 33.9 32.0 - 36.0 g/dL   RDW 13.9 11.0 - 15.0 %   Platelets 199 140 - 400 K/uL   MPV 9.5 7.5 - 12.5 fL   Neutro Abs 6052 1500 - 7800 cells/uL   Lymphs Abs 2136 850 - 3900 cells/uL   Monocytes Absolute 534 200 - 950 cells/uL   Eosinophils Absolute 178 15 - 500 cells/uL   Basophils Absolute 0 0 - 200 cells/uL   Neutrophils Relative % 68 %   Lymphocytes Relative 24 %   Monocytes Relative 6 %   Eosinophils Relative 2 %   Basophils Relative 0 %   Smear Review Criteria for review not met       Assessment & Plan:   Problem List Items Addressed This Visit      Cardiovascular and Mediastinum   Hypertension, goal below 140/90    Well-controlled today      Relevant Medications   aspirin EC 81 MG tablet   atorvastatin (LIPITOR) 10 MG tablet   lisinopril (ZESTRIL) 2.5 MG tablet     Endocrine   Diabetes mellitus type 2, controlled, without complications (HCC) - Primary    Start baby aspirin; check labs and then consider ACE-I and statin --> labs back, start both; foot exam by MD  Relevant Medications   aspirin EC 81 MG tablet   atorvastatin (LIPITOR) 10 MG tablet    lisinopril (ZESTRIL) 2.5 MG tablet   Other Relevant Orders   Hemoglobin A1c (Completed)   Lipid panel (Completed)     Musculoskeletal and Integument   Pre-ulcerative calluses    Pared down with #10 blade today; patient tolerated well        Genitourinary   Chronic kidney disease (CKD), stage II (mild)    Noted on previous labs; check labs today and then add ACE-I      Relevant Orders   BASIC METABOLIC PANEL WITH GFR     Other   Obesity, Class I, BMI 30-34.9    Work on weight loss      Hyperlipidemia LDL goal <100    Check labs and then start statin; limit saturated fats --> labs back, start statin      Relevant Medications   aspirin EC 81 MG tablet   atorvastatin (LIPITOR) 10 MG tablet   lisinopril (ZESTRIL) 2.5 MG tablet   Other Relevant Orders   Lipid panel    Other Visit Diagnoses    Medication monitoring encounter        Relevant Orders    Comprehensive metabolic panel (Completed)    CBC with Differential/Platelet (Completed)    BASIC METABOLIC PANEL WITH GFR    ALT       Follow up plan: Return in about 6 months (around 10/10/2016) for next visit with Dr. Sanda Klein, with fasting labs.  An after-visit summary was printed and given to the patient at [place].  Please see the patient instructions which may contain other information and recommendations beyond what is mentioned above in the assessment and plan.  Meds ordered this encounter  Medications  . aspirin EC 81 MG tablet    Sig: Take 1 tablet (81 mg total) by mouth daily.  Marland Kitchen atorvastatin (LIPITOR) 10 MG tablet    Sig: Take 1 tablet (10 mg total) by mouth at bedtime.    Dispense:  30 tablet    Refill:  1  . lisinopril (ZESTRIL) 2.5 MG tablet    Sig: Take 1 tablet (2.5 mg total) by mouth daily. (Check labs 2-3 weeks after starting this med)    Dispense:  30 tablet    Refill:  0   Orders Placed This Encounter  Procedures  . Hemoglobin A1c  . Lipid panel  . Comprehensive metabolic panel  . CBC with  Differential/Platelet  . BASIC METABOLIC PANEL WITH GFR  . Lipid panel  . ALT

## 2016-03-28 NOTE — Assessment & Plan Note (Addendum)
Start baby aspirin; check labs and then consider ACE-I and statin --> labs back, start both; foot exam by MD

## 2016-04-01 LAB — COMPREHENSIVE METABOLIC PANEL
ALBUMIN: 4 g/dL (ref 3.6–5.1)
ALK PHOS: 74 U/L (ref 40–115)
ALT: 16 U/L (ref 9–46)
AST: 18 U/L (ref 10–35)
BILIRUBIN TOTAL: 0.9 mg/dL (ref 0.2–1.2)
BUN: 26 mg/dL — ABNORMAL HIGH (ref 7–25)
CALCIUM: 8.8 mg/dL (ref 8.6–10.3)
CO2: 22 mmol/L (ref 20–31)
Chloride: 106 mmol/L (ref 98–110)
Creat: 1.39 mg/dL — ABNORMAL HIGH (ref 0.70–1.25)
GLUCOSE: 111 mg/dL — AB (ref 65–99)
POTASSIUM: 4.7 mmol/L (ref 3.5–5.3)
Sodium: 141 mmol/L (ref 135–146)
Total Protein: 6.6 g/dL (ref 6.1–8.1)

## 2016-04-01 LAB — CBC WITH DIFFERENTIAL/PLATELET
BASOS ABS: 0 {cells}/uL (ref 0–200)
BASOS PCT: 0 %
EOS ABS: 178 {cells}/uL (ref 15–500)
EOS PCT: 2 %
HCT: 41 % (ref 38.5–50.0)
HEMOGLOBIN: 13.9 g/dL (ref 13.2–17.1)
LYMPHS ABS: 2136 {cells}/uL (ref 850–3900)
Lymphocytes Relative: 24 %
MCH: 29.7 pg (ref 27.0–33.0)
MCHC: 33.9 g/dL (ref 32.0–36.0)
MCV: 87.6 fL (ref 80.0–100.0)
MONOS PCT: 6 %
MPV: 9.5 fL (ref 7.5–12.5)
Monocytes Absolute: 534 cells/uL (ref 200–950)
NEUTROS ABS: 6052 {cells}/uL (ref 1500–7800)
Neutrophils Relative %: 68 %
PLATELETS: 199 10*3/uL (ref 140–400)
RBC: 4.68 MIL/uL (ref 4.20–5.80)
RDW: 13.9 % (ref 11.0–15.0)
WBC: 8.9 10*3/uL (ref 3.8–10.8)

## 2016-04-01 LAB — LIPID PANEL
CHOL/HDL RATIO: 3.6 ratio (ref <=5.0)
CHOLESTEROL: 179 mg/dL (ref 125–200)
HDL: 50 mg/dL (ref >=40)
LDL Cholesterol: 114 mg/dL (ref <130)
Triglycerides: 77 mg/dL (ref <150)
VLDL: 15 mg/dL (ref <30)

## 2016-04-01 LAB — HEMOGLOBIN A1C
Hgb A1c MFr Bld: 6.4 % — ABNORMAL HIGH (ref <5.7)
MEAN PLASMA GLUCOSE: 137 mg/dL

## 2016-04-02 MED ORDER — ATORVASTATIN CALCIUM 10 MG PO TABS: 10.0000 mg | ORAL_TABLET | Freq: Every day | ORAL | Status: DC

## 2016-04-02 MED ORDER — LISINOPRIL 2.5 MG PO TABS: 2.5000 mg | ORAL_TABLET | Freq: Every day | ORAL | Status: DC

## 2016-04-02 NOTE — Assessment & Plan Note (Signed)
Well controlled today.

## 2016-04-02 NOTE — Assessment & Plan Note (Signed)
Noted on previous labs; check labs today and then add ACE-I

## 2016-04-02 NOTE — Assessment & Plan Note (Signed)
Work on weight loss.

## 2016-04-02 NOTE — Assessment & Plan Note (Addendum)
Pared down with #10 blade today; lesion larger than 1 cm x 1 cm x 5 mm; patient tolerated well

## 2016-04-02 NOTE — Assessment & Plan Note (Signed)
Check labs and then start statin; limit saturated fats --> labs back, start statin

## 2016-04-04 ENCOUNTER — Telehealth: Payer: Self-pay | Admitting: Family Medicine

## 2016-04-04 NOTE — Telephone Encounter (Signed)
Left voice mail

## 2016-04-04 NOTE — Telephone Encounter (Signed)
PT SAID THAT HE DOES NOT UNDERSTAND WHY HE WAS GIVEN A BLOOD PRESSURE MEDICATION AND HE IS NOT HAD OR HAVING ANY BLOOD RESSSURE PROBLEMS. HIS BP 122/77 LAST WEEK AT HIS APPT. PLEASE CALL AND EXPLAIN WHY HE IS HAVING TO DO THIS. HE HAS NOT HAD TO TAKE ANY BP MEDS IN 10 TO 15 YRS.

## 2016-05-02 ENCOUNTER — Other Ambulatory Visit: Payer: Self-pay | Admitting: Family Medicine

## 2016-05-03 NOTE — Telephone Encounter (Signed)
Labs overdue; gentle reminder with Rx

## 2016-06-01 ENCOUNTER — Other Ambulatory Visit: Payer: Self-pay

## 2016-06-01 DIAGNOSIS — Z5181 Encounter for therapeutic drug level monitoring: Secondary | ICD-10-CM

## 2016-06-01 DIAGNOSIS — E785 Hyperlipidemia, unspecified: Secondary | ICD-10-CM

## 2016-06-03 ENCOUNTER — Other Ambulatory Visit: Payer: Self-pay | Admitting: Family Medicine

## 2016-06-03 LAB — LIPID PANEL
Cholesterol: 148 mg/dL (ref 125–200)
HDL: 47 mg/dL (ref >=40)
LDL CALC: 80 mg/dL (ref <130)
TRIGLYCERIDES: 105 mg/dL (ref <150)
Total CHOL/HDL Ratio: 3.1 Ratio (ref <=5.0)
VLDL: 21 mg/dL (ref <30)

## 2016-06-03 LAB — ALT: ALT: 18 U/L (ref 9–46)

## 2016-06-03 MED ORDER — ATORVASTATIN CALCIUM 10 MG PO TABS: 10.0000 mg | ORAL_TABLET | Freq: Every day | ORAL | 5 refills | Status: DC

## 2016-10-10 ENCOUNTER — Encounter: Payer: Self-pay | Admitting: Family Medicine

## 2016-10-10 ENCOUNTER — Ambulatory Visit (INDEPENDENT_AMBULATORY_CARE_PROVIDER_SITE_OTHER): Payer: Managed Care, Other (non HMO) | Admitting: Family Medicine

## 2016-10-10 VITALS — BP 120/70 | HR 93 | Temp 98.2°F | Resp 14 | Wt 212.9 lb

## 2016-10-10 DIAGNOSIS — E119 Type 2 diabetes mellitus without complications: Secondary | ICD-10-CM | POA: Diagnosis not present

## 2016-10-10 DIAGNOSIS — R319 Hematuria, unspecified: Secondary | ICD-10-CM

## 2016-10-10 DIAGNOSIS — R972 Elevated prostate specific antigen [PSA]: Secondary | ICD-10-CM

## 2016-10-10 DIAGNOSIS — H6123 Impacted cerumen, bilateral: Secondary | ICD-10-CM | POA: Diagnosis not present

## 2016-10-10 DIAGNOSIS — Z5181 Encounter for therapeutic drug level monitoring: Secondary | ICD-10-CM

## 2016-10-10 DIAGNOSIS — E785 Hyperlipidemia, unspecified: Secondary | ICD-10-CM | POA: Diagnosis not present

## 2016-10-10 DIAGNOSIS — R809 Proteinuria, unspecified: Secondary | ICD-10-CM | POA: Diagnosis not present

## 2016-10-10 LAB — CBC WITH DIFFERENTIAL/PLATELET
BASOS ABS: 0 {cells}/uL (ref 0–200)
Basophils Relative: 0 %
EOS ABS: 261 {cells}/uL (ref 15–500)
Eosinophils Relative: 3 %
HEMATOCRIT: 40.5 % (ref 38.5–50.0)
Hemoglobin: 13.4 g/dL (ref 13.2–17.1)
LYMPHS PCT: 26 %
Lymphs Abs: 2262 cells/uL (ref 850–3900)
MCH: 29.2 pg (ref 27.0–33.0)
MCHC: 33.1 g/dL (ref 32.0–36.0)
MCV: 88.2 fL (ref 80.0–100.0)
MONO ABS: 609 {cells}/uL (ref 200–950)
MPV: 9.4 fL (ref 7.5–12.5)
Monocytes Relative: 7 %
NEUTROS PCT: 64 %
Neutro Abs: 5568 cells/uL (ref 1500–7800)
Platelets: 216 10*3/uL (ref 140–400)
RBC: 4.59 MIL/uL (ref 4.20–5.80)
RDW: 13.9 % (ref 11.0–15.0)
WBC: 8.7 10*3/uL (ref 3.8–10.8)

## 2016-10-10 LAB — URINALYSIS W MICROSCOPIC + REFLEX CULTURE
BACTERIA UA: NONE SEEN [HPF]
BILIRUBIN URINE: NEGATIVE
CRYSTALS: NONE SEEN [HPF]
Casts: NONE SEEN [LPF]
Glucose, UA: NEGATIVE
HGB URINE DIPSTICK: NEGATIVE
KETONES UR: NEGATIVE
Leukocytes, UA: NEGATIVE
Nitrite: NEGATIVE
Specific Gravity, Urine: 1.024 (ref 1.001–1.035)
Squamous Epithelial / LPF: NONE SEEN [HPF] (ref <=5)
Yeast: NONE SEEN [HPF]
pH: 6.5 (ref 5.0–8.0)

## 2016-10-10 LAB — COMPLETE METABOLIC PANEL WITH GFR
ALBUMIN: 3.9 g/dL (ref 3.6–5.1)
ALK PHOS: 75 U/L (ref 40–115)
ALT: 17 U/L (ref 9–46)
AST: 16 U/L (ref 10–35)
BILIRUBIN TOTAL: 0.5 mg/dL (ref 0.2–1.2)
BUN: 24 mg/dL (ref 7–25)
CO2: 25 mmol/L (ref 20–31)
CREATININE: 1.36 mg/dL — AB (ref 0.70–1.25)
Calcium: 9 mg/dL (ref 8.6–10.3)
Chloride: 107 mmol/L (ref 98–110)
GFR, EST AFRICAN AMERICAN: 63 mL/min (ref >=60)
GFR, EST NON AFRICAN AMERICAN: 55 mL/min — AB (ref >=60)
Glucose, Bld: 118 mg/dL — ABNORMAL HIGH (ref 65–99)
Potassium: 4.5 mmol/L (ref 3.5–5.3)
Sodium: 141 mmol/L (ref 135–146)
TOTAL PROTEIN: 6.6 g/dL (ref 6.1–8.1)

## 2016-10-10 LAB — LIPID PANEL
Cholesterol: 171 mg/dL (ref <200)
HDL: 36 mg/dL — AB (ref >40)
LDL Cholesterol: 101 mg/dL — ABNORMAL HIGH (ref <100)
Total CHOL/HDL Ratio: 4.8 Ratio (ref <5.0)
Triglycerides: 168 mg/dL — ABNORMAL HIGH (ref <150)
VLDL: 34 mg/dL — ABNORMAL HIGH (ref <30)

## 2016-10-10 LAB — HEMOGLOBIN A1C
Hgb A1c MFr Bld: 6.4 % — ABNORMAL HIGH (ref <5.7)
Mean Plasma Glucose: 137 mg/dL

## 2016-10-10 LAB — PSA: PSA: 8.6 ng/mL — AB (ref <=4.0)

## 2016-10-10 NOTE — Assessment & Plan Note (Signed)
Check urine.

## 2016-10-10 NOTE — Assessment & Plan Note (Addendum)
Labs; refer to eye doctor; foot exam by MD; urged patient to watch feet closely with his calluses; offered referral to podiatrist

## 2016-10-10 NOTE — Assessment & Plan Note (Signed)
Last lipids reviewed

## 2016-10-10 NOTE — Progress Notes (Signed)
BP 120/70   Pulse 93   Temp 98.2 F (36.8 C) (Oral)   Resp 14   Wt 212 lb 14.4 oz (96.6 kg)   SpO2 97%   BMI 31.44 kg/m    Subjective:    Patient ID: Scott [name], male    DOB: January 13, 1951, 66 y.o.   MRN: [id number]  HPI: Scott [name] is a 66 y.o. male  Chief Complaint  Patient presents with  . Follow-up   Type 2 diabetes; no problems with his feet; he get nails trimmed at place where he gets pedicures; taking metformin; no problems with stomach; last eye exam was a year ago  He has impacted cerumen in both ears; would like addressed today  Working [employer] a lot, long days; trying to eat well  Hypertension; not taking any medicine right now; no problems with it, just stopped taking it  High cholesterol; not taking the statin; they just stopped giving him the medicine at the pharmacy so he quit taking it; no problems with it  Previous problem list shows elevated PSA; he has not had this rechecked; note from August 2016 reviewed  Depression screen [facility] 2/9 10/10/2016 03/28/2016 10/09/2015 05/08/2015  Decreased Interest 0 0 0 0  Down, Depressed, Hopeless 0 0 0 0  PHQ - 2 Score 0 0 0 0   Relevant past medical, surgical, family and social history reviewed Past Medical History:  Diagnosis Date  . Adrenal nodule ([place])   . Borderline diabetes   . Chronic kidney disease   . CKD (chronic kidney disease), stage II   . Diabetes mellitus without complication ([place])   . Elevated PSA   . Gross hematuria   . History of hepatitis B   . HTN (hypertension)   . HTN, goal below 150/90   . Hyperlipidemia   . Hyperlipidemia with target LDL less than 100   . Obesity   . Prostate cancer [facility])    Past Surgical History:  Procedure Laterality Date  . ADENOIDECTOMY    . COLONOSCOPY  11/18/2014  . PROCTOSCOPY  04/2014  . TONSILLECTOMY     Family History  Problem Relation Age of Onset  . Parkinson's disease Brother   . Heart disease Mother   . Heart disease Father   . Diabetes  Mellitus II Mother   . Coronary artery disease Mother    Social History  Substance Use Topics  . Smoking status: Never Smoker  . Smokeless tobacco: Never Used  . Alcohol use No   Interim medical history since last visit reviewed. Allergies and medications reviewed  Review of Systems Per HPI unless specifically indicated above     Objective:    BP 120/70   Pulse 93   Temp 98.2 F (36.8 C) (Oral)   Resp 14   Wt 212 lb 14.4 oz (96.6 kg)   SpO2 97%   BMI 31.44 kg/m   Wt Readings from Last 3 Encounters:  10/10/16 212 lb 14.4 oz (96.6 kg)  03/28/16 213 lb 6.4 oz (96.8 kg)  10/09/15 208 lb 14.4 oz (94.8 kg)    Physical Exam  Constitutional: He appears well-developed and well-nourished. No distress.  HENT:  Head: Normocephalic and atraumatic.  Impacted cerumen bilaterally; large amounts of cerumen removed with irrigation with warm water, assisted by curette; patient tolerated well  Eyes: EOM are normal. No scleral icterus.  Neck: No thyromegaly present.  Cardiovascular: Normal rate and regular rhythm.   Pulmonary/Chest: Effort normal and breath  sounds normal.  Abdominal: Soft. Bowel sounds are normal. He exhibits no distension.  Musculoskeletal: He exhibits no edema.  Neurological: Coordination normal.  Skin: Skin is warm and [name]. No pallor.  Thick hard callus left great toe; no ulcer  Psychiatric: He has a normal mood and affect. His behavior is normal. Judgment and thought content normal. His mood appears not anxious. He does not exhibit a depressed mood.   Diabetic Foot Form - Detailed   Diabetic Foot Exam - detailed Diabetic Foot exam was performed with the following findings:  Yes 10/10/2016  8:27 AM  Visual Foot Exam completed.:  Yes  Is there a history of foot ulcer?:  No Are the toenails ingrown?:  No Normal Range of Motion:  Yes Pulse Foot Exam completed.:  Yes  Right Dorsalis Pedis:  Present Left Dorsalis Pedis:  Present  Sensory Foot Exam Completed.:   Yes Semmes-Weinstein Monofilament Test         Assessment & Plan:   Problem List Items Addressed This Visit      Endocrine   Diabetes mellitus type 2, controlled, without complications ([place]) - Primary    Labs; refer to eye doctor; foot exam by MD; urged patient to watch feet closely with his calluses; offered referral to podiatrist      Relevant Orders   Microalbumin / creatinine urine ratio (Completed)   Hemoglobin A1c (Completed)   Ambulatory referral to Ophthalmology     Other   Medication monitoring encounter    Check labs      Relevant Orders   CBC with Differential/Platelet (Completed)   COMPLETE METABOLIC PANEL WITH GFR (Completed)   Hyperlipidemia LDL goal <100    Last lipids reviewed      Relevant Orders   Lipid panel (Completed)   Blood in the urine    Check urine      Relevant Orders   Urinalysis w microscopic + reflex cultur (Completed)   Abnormal prostate specific antigen    Check PSA      Relevant Orders   PSA (Completed)   Abnormal presence of protein in urine    Check urine      Relevant Orders   Microalbumin / creatinine urine ratio (Completed)    Other Visit Diagnoses    Impacted cerumen of both ears       both ears irrigated, large amounts of impacted cerumen removed with good results, patient tolerated well      Follow up plan: Return in about 3 months (around 01/08/2017) for follow-up.  An after-visit summary was printed and given to the patient at [place].  Please see the patient instructions which may contain other information and recommendations beyond what is mentioned above in the assessment and plan.  No orders of the defined types were placed in this encounter.   Orders Placed This Encounter  Procedures  . Urinalysis w microscopic + reflex cultur  . Microalbumin / creatinine urine ratio  . CBC with Differential/Platelet  . Hemoglobin A1c  . COMPLETE METABOLIC PANEL WITH GFR  . Lipid panel  . PSA  . Ambulatory  referral to Ophthalmology

## 2016-10-10 NOTE — Assessment & Plan Note (Signed)
Check PSA. ?

## 2016-10-10 NOTE — Assessment & Plan Note (Signed)
Check labs 

## 2016-10-10 NOTE — Patient Instructions (Addendum)
You'll hear from [place] about your labs We'll have you see the eye doctor Please do see your eye doctor regularly, and have your eyes examined every year (or more often per his or her recommendation) Check your feet every night and let me know right away of any sores, infections, numbness, etc. Try to limit sweets, white bread, white rice, white potatoes It is okay with me for you to not check your fingerstick blood sugars (per [employer] of [employer]ocrinology Best Practices), unless you are interested and feel it would be helpful for you Just let me know if you'd like to see a podiatrist (foot doctor) If you have not heard anything from my staff in a week about any orders/referrals/studies from today, please contact us here to follow-up [phone] Try to lose five pounds before your next visit

## 2016-10-11 LAB — MICROALBUMIN / CREATININE URINE RATIO
CREATININE, URINE: 176 mg/dL (ref 20–370)
Microalb Creat Ratio: 77 mcg/mg creat — ABNORMAL HIGH (ref <30)
Microalb, Ur: 13.6 mg/dL

## 2016-10-17 ENCOUNTER — Other Ambulatory Visit: Payer: Self-pay | Admitting: Family Medicine

## 2016-10-17 ENCOUNTER — Telehealth: Payer: Self-pay

## 2016-10-17 DIAGNOSIS — R972 Elevated prostate specific antigen [PSA]: Secondary | ICD-10-CM

## 2016-10-17 DIAGNOSIS — N182 Chronic kidney disease, stage 2 (mild): Secondary | ICD-10-CM

## 2016-10-17 DIAGNOSIS — E8881 Metabolic syndrome: Secondary | ICD-10-CM

## 2016-10-17 NOTE — Telephone Encounter (Signed)
I left message; calling about lab results; sorry to have missed him; I'll try him tomorrow

## 2016-10-17 NOTE — Telephone Encounter (Signed)
Patient came in and asked about his lab results. Results are in but I see you haven't not reviewed them yet.

## 2016-10-18 MED ORDER — ATORVASTATIN CALCIUM 10 MG PO TABS: 10.0000 mg | ORAL_TABLET | Freq: Every day | ORAL | 5 refills | Status: DC

## 2016-10-18 MED ORDER — LISINOPRIL 2.5 MG PO TABS: 2.5000 mg | ORAL_TABLET | Freq: Every day | ORAL | 0 refills | Status: DC

## 2016-10-18 NOTE — Assessment & Plan Note (Signed)
Refer back to [facility]

## 2016-10-18 NOTE — Telephone Encounter (Signed)
Talked to patient in person about labns Avoid NSAIDs Encouraged hydration Refer to urologist Back on statin A1c 6.4, good control CBC normal Urine microalb:Cr elevated; eat less red meat Restart lisinopril b/c of slightly elev Cr and urine; recheck BMP in 2 weeks

## 2016-10-18 NOTE — Assessment & Plan Note (Signed)
Check BMP after ACE-I x 2 weeks

## 2016-10-26 ENCOUNTER — Other Ambulatory Visit: Payer: Self-pay

## 2016-10-31 ENCOUNTER — Other Ambulatory Visit: Payer: Self-pay | Admitting: Family Medicine

## 2016-10-31 DIAGNOSIS — E8881 Metabolic syndrome: Secondary | ICD-10-CM

## 2016-10-31 NOTE — Telephone Encounter (Signed)
Pt only have 4 pills left on metformin. Please send refill to [facility]

## 2016-11-01 MED ORDER — METFORMIN HCL 500 MG PO TABS: 500.0000 mg | ORAL_TABLET | Freq: Every day | ORAL | 0 refills | Status: DC

## 2016-11-01 NOTE — Telephone Encounter (Signed)
rx sent

## 2016-11-02 ENCOUNTER — Encounter: Payer: Self-pay | Admitting: Urology

## 2016-11-02 ENCOUNTER — Ambulatory Visit: Payer: Managed Care, Other (non HMO) | Admitting: Urology

## 2016-11-02 VITALS — BP 139/82 | HR 94 | Ht 69.0 in | Wt 207.2 lb

## 2016-11-02 DIAGNOSIS — R972 Elevated prostate specific antigen [PSA]: Secondary | ICD-10-CM

## 2016-11-02 DIAGNOSIS — R3129 Other microscopic hematuria: Secondary | ICD-10-CM | POA: Diagnosis not present

## 2016-11-02 DIAGNOSIS — N401 Enlarged prostate with lower urinary tract symptoms: Secondary | ICD-10-CM | POA: Diagnosis not present

## 2016-11-02 DIAGNOSIS — N529 Male erectile dysfunction, unspecified: Secondary | ICD-10-CM

## 2016-11-02 DIAGNOSIS — N138 Other obstructive and reflux uropathy: Secondary | ICD-10-CM

## 2016-11-02 LAB — URINALYSIS, COMPLETE
Bilirubin, UA: NEGATIVE
GLUCOSE, UA: NEGATIVE
Ketones, UA: NEGATIVE
LEUKOCYTES UA: NEGATIVE
Nitrite, UA: NEGATIVE
PH UA: 5.5 (ref 5.0–7.5)
Specific Gravity, UA: >1.03 — ABNORMAL HIGH (ref 1.005–1.030)
UUROB: 0.2 mg/dL (ref 0.2–1.0)

## 2016-11-02 LAB — MICROSCOPIC EXAMINATION: Bacteria, UA: NONE SEEN

## 2016-11-02 LAB — BLADDER SCAN AMB NON-IMAGING: SCAN RESULT: 53

## 2016-11-02 MED ORDER — FINASTERIDE 5 MG PO TABS: 5.0000 mg | ORAL_TABLET | Freq: Every day | ORAL | 3 refills | Status: DC

## 2016-11-02 NOTE — Progress Notes (Signed)
11/02/2016 10:37 AM   Scott Woods 10/06/1950 [id number]  Referring provider: Arnetha Courser, MD [address] [place], [place] [address]  Chief Complaint  Patient presents with  . Elevated PSA    last seen 11/15/2015    HPI: 66 yo WM who is referred by Dr. Sanda Klein for an elevated PSA.    PSA was found to be 8.6 ng/mL on 10/10/2016 during a routine screening.  Patient was seen by Carlyle Lipa, NP in 2015 for a PSA of 9.5.  He did undergo a biopsy at that time and the biopsy was negative.  His repeated PSA returned at 8.7 six months later.  He was to be followed on a biannual basis, but he did not return for follow up.    His IPSS score today is 12, which is moderate lower urinary tract symptomatology. He is mostly dissatisfied with his quality life due to his urinary symptoms.  His PVR is 53 mL.      His major complaint today frequency, nocturia and intermittency.  He has had these symptoms for the few weeks.  He denies any dysuria, hematuria or suprapubic pain.   He also denies any recent fevers, chills, nausea or vomiting.  He does not have a family history of PCa.      IPSS    Row Name 11/02/16 1000         International Prostate Symptom Score   How often have you had the sensation of not emptying your bladder? Less than half the time     How often have you had to urinate less than every two hours? More than half the time     How often have you found you stopped and started again several times when you urinated? Less than 1 in 5 times     How often have you found it difficult to postpone urination? Less than 1 in 5 times     How often have you had a weak urinary stream? Not at All     How often have you had to strain to start urination? Not at All     How many times did you typically get up at night to urinate? 4 Times     Total IPSS Score 12       Quality of Life due to urinary symptoms   If you were to spend the rest of your life with your urinary  condition just the way it is now how would you feel about that? Mostly Disatisfied        Score:  1-7 Mild 8-19 Moderate 20-35 Severe   He also had a hematuria workup for gross hematuria in 2015 with CT Urogram demonstrating a 1.2 cm left adrenal nodule.  Cystoscopy was negative.  His UA today 3-10 RBC's.     Erectile dysfunction His SHIM score is 7, which is severe ED.   He has been having difficulty with erections for several years.   His major complaint is not achieving an erection.  His libido is diminished.   His risk factors for ED are age, BPH, DM, HTN, HLD and blood pressure medications. He denies any painful erections or curvatures with his erections.   He is not interested in treatment at this time.      SHIM    Row Name 11/02/16 1003         SHIM: Over the last 6 months:   How do you rate your confidence that  you could get and keep an erection? Moderate     When you had erections with sexual stimulation, how often were your erections hard enough for penetration (entering your partner)? Almost Never or Never     During sexual intercourse, how often were you able to maintain your erection after you had penetrated (entered) your partner? Extremely Difficult     During sexual intercourse, how difficult was it to maintain your erection to completion of intercourse? Extremely Difficult     When you attempted sexual intercourse, how often was it satisfactory for you? Extremely Difficult       SHIM Total Score   SHIM 7        Score: 1-7 Severe ED 8-11 Moderate ED 12-16 Mild-Moderate ED 17-21 Mild ED 22-25 No ED    PMH: Past Medical History:  Diagnosis Date  . Adrenal nodule ([place])   . Borderline diabetes   . Chronic kidney disease   . CKD (chronic kidney disease), stage II   . Diabetes mellitus without complication ([place])   . Elevated PSA   . Gross hematuria   . History of hepatitis B   . HTN (hypertension)   . HTN, goal below 150/90   . Hyperlipidemia   .  Hyperlipidemia with target LDL less than 100   . Obesity   . Prostate cancer [facility])     Surgical History: Past Surgical History:  Procedure Laterality Date  . ADENOIDECTOMY    . COLONOSCOPY  11/18/2014  . PROCTOSCOPY  04/2014  . TONSILLECTOMY      Home Medications:  Allergies as of 11/02/2016   No Known Allergies     Medication List       Accurate as of 11/02/16 10:37 AM. Always use your most recent med list.          aspirin EC 81 MG tablet Take 1 tablet (81 mg total) by mouth daily.   atorvastatin 10 MG tablet Commonly known as:  LIPITOR Take 1 tablet (10 mg total) by mouth at bedtime.   finasteride 5 MG tablet Commonly known as:  PROSCAR Take 1 tablet (5 mg total) by mouth daily.   lisinopril 2.5 MG tablet Commonly known as:  PRINIVIL,ZESTRIL Take 1 tablet (2.5 mg total) by mouth daily. (labs due now please)   metFORMIN 500 MG tablet Commonly known as:  GLUCOPHAGE Take 1 tablet (500 mg total) by mouth daily.       Allergies: No Known Allergies  Family History: Family History  Problem Relation Age of Onset  . Parkinson's disease Brother   . Heart disease Mother   . Diabetes Mellitus II Mother   . Coronary artery disease Mother   . Heart disease Father   . Prostate cancer Neg Hx   . Kidney cancer Neg Hx   . Bladder Cancer Neg Hx     Social History:  reports that he has never smoked. He has never used smokeless tobacco. He reports that he does not drink alcohol or use drugs.  ROS: UROLOGY Frequent Urination?: Yes Hard to postpone urination?: No Burning/pain with urination?: No Get up at night to urinate?: Yes Leakage of urine?: No Urine stream starts and stops?: Yes Trouble starting stream?: No Do you have to strain to urinate?: No Blood in urine?: No Urinary tract infection?: No Sexually transmitted disease?: No Injury to kidneys or bladder?: No Painful intercourse?: No Weak stream?: No Erection problems?: No Penile pain?:  No  Gastrointestinal Nausea?: No Vomiting?: No Indigestion/heartburn?: No Diarrhea?:  No Constipation?: No  Constitutional Fever: No Night sweats?: No Weight loss?: No Fatigue?: No  Skin Skin rash/lesions?: No Itching?: No  Eyes Blurred vision?: No Double vision?: No  Ears/Nose/Throat Sore throat?: No Sinus problems?: No  Hematologic/Lymphatic Swollen glands?: No Easy bruising?: No  Cardiovascular Leg swelling?: No Chest pain?: No  Respiratory Cough?: Yes Shortness of breath?: No  Endocrine Excessive thirst?: No  Musculoskeletal Back pain?: No Joint pain?: No  Neurological Headaches?: No Dizziness?: No  Psychologic Depression?: No Anxiety?: No  Physical Exam: BP 139/82   Pulse 94   Ht 5\' 9"  (1.753 m)   Wt 207 lb 3.2 oz (94 kg)   BMI 30.60 kg/m   Constitutional: Well nourished. Alert and oriented, No acute distress. HEENT: [place] AT, moist mucus membranes. Trachea midline, no masses. Cardiovascular: No clubbing, cyanosis, or edema. Respiratory: Normal respiratory effort, no increased work of breathing. GI: Abdomen is soft, non tender, non distended, no abdominal masses. Liver and spleen not palpable.  No hernias appreciated.  Stool sample for occult testing is not indicated.   GU: No CVA tenderness.  No bladder fullness or masses.  Patient with circumcised phallus.  Urethral meatus is patent.  No penile discharge. No penile lesions or rashes. Scrotum without lesions, cysts, rashes and/or edema.  Testicles are located scrotally bilaterally. No masses are appreciated in the testicles. Left and right epididymis are normal. Rectal: Patient with  normal sphincter tone. Anus and perineum without scarring or rashes. No rectal masses are appreciated. Prostate is approximately 60 grams, no nodules are appreciated. Seminal vesicles are normal. Skin: No rashes, bruises or suspicious lesions. Lymph: No cervical or inguinal adenopathy. Neurologic: Grossly intact,  no focal deficits, moving all 4 extremities. Psychiatric: Normal mood and affect.  Laboratory Data: Lab Results  Component Value Date   WBC 8.7 10/10/2016   HGB 13.4 10/10/2016   HCT 40.5 10/10/2016   MCV 88.2 10/10/2016   PLT 216 10/10/2016    Lab Results  Component Value Date   CREATININE 1.36 (H) 10/10/2016    Lab Results  Component Value Date   PSA 8.6 (H) 10/10/2016    Lab Results  Component Value Date   HGBA1C 6.4 (H) 10/10/2016       Component Value Date/Time   CHOL 171 10/10/2016 1632   HDL 36 (L) 10/10/2016 1632   CHOLHDL 4.8 10/10/2016 1632   VLDL 34 (H) 10/10/2016 1632   LDLCALC 101 (H) 10/10/2016 1632    Lab Results  Component Value Date   AST 16 10/10/2016   Lab Results  Component Value Date   ALT 17 10/10/2016    Urinalysis 3-10 RBC's.  See EPIC.    Pertinent Imaging: Results for WENCESLAO, LOPER (MRN [id number]) as of 11/02/2016 10:25  Ref. Range 11/02/2016 10:05  Scan Result Unknown 53    Assessment & Plan:    1. Elevated PSA  - PSA appears to be stable at this time  - will obtain free and total PSA  - will contact patient with those results  2. Microhematuria  - hematuria workup in 2015 - no worrisome lesions discovered  - UA today demonstrates 3-10 RBC's - sent for culture  - BUN + creatinine  - repeat hematuria work up with CT Urogram and cystoscopy  3. BPH with LUTS  - IPSS score is 12/4  - Continue conservative management, avoiding bladder irritants and timed voiding's  - Initiate 5 alpha reductase inhibitor (finasteride 5 mg daily), discussed side effects  -  RTC pending free and total PSA results   4. Erectile dysfunction  - SHIM score is 7  - Not interested in sexual activity at this time  Return for CT Urogram report and cystoscopy.  These notes generated with voice recognition software. I apologize for typographical errors.  Zara Council, [facility] [address], [place], [place] [address] [phone]

## 2016-11-04 ENCOUNTER — Telehealth: Payer: Self-pay

## 2016-11-04 LAB — BUN+CREAT
BUN / CREAT RATIO: 19 (ref 10–24)
BUN: 24 mg/dL (ref 8–27)
CREATININE: 1.26 mg/dL (ref 0.76–1.27)
GFR calc Af Amer: 69 mL/min/{1.73_m2} (ref >59)
GFR calc non Af Amer: 60 mL/min/{1.73_m2} (ref >59)

## 2016-11-04 LAB — PLEASE NOTE

## 2016-11-04 LAB — PSA, TOTAL AND FREE
PROSTATE SPECIFIC AG, SERUM: 9.2 ng/mL — AB (ref 0.0–4.0)
PSA, Free Pct: 27.3 %
PSA, Free: 2.51 ng/mL

## 2016-11-04 NOTE — Telephone Encounter (Signed)
-----   Message from Nori Riis, PA-C sent at 11/04/2016  8:09 AM EST ----- Please notify the patient that his free and total PSA indicated a 5% probability of having prostate cancer.  I do not suggest a prostate biopsy at this time, but I do suggest he proceed with his CT Urogram and cystoscopy.

## 2016-11-04 NOTE — Telephone Encounter (Signed)
LMOM

## 2016-11-04 NOTE — Telephone Encounter (Signed)
I read message to pt from [facility].  He knows to get CT and come back in March for cysto.

## 2016-11-10 LAB — HM DIABETES EYE EXAM

## 2016-11-15 ENCOUNTER — Other Ambulatory Visit: Payer: Self-pay

## 2016-11-15 NOTE — Telephone Encounter (Signed)
error 

## 2016-11-16 ENCOUNTER — Telehealth: Payer: Self-pay

## 2016-11-16 ENCOUNTER — Ambulatory Visit
Admission: RE | Admit: 2016-11-16 | Discharge: 2016-11-16 | Disposition: A | Discharge status: Home / Self Care | Payer: 59 | Source: Ambulatory Visit | Attending: Urology | Admitting: Urology

## 2016-11-16 DIAGNOSIS — R3129 Other microscopic hematuria: Secondary | ICD-10-CM

## 2016-11-16 DIAGNOSIS — D3502 Benign neoplasm of left adrenal gland: Secondary | ICD-10-CM | POA: Insufficient documentation

## 2016-11-16 DIAGNOSIS — K8020 Calculus of gallbladder without cholecystitis without obstruction: Secondary | ICD-10-CM | POA: Diagnosis not present

## 2016-11-16 DIAGNOSIS — N400 Enlarged prostate without lower urinary tract symptoms: Secondary | ICD-10-CM | POA: Diagnosis not present

## 2016-11-16 DIAGNOSIS — K5730 Diverticulosis of large intestine without perforation or abscess without bleeding: Secondary | ICD-10-CM | POA: Insufficient documentation

## 2016-11-16 DIAGNOSIS — N320 Bladder-neck obstruction: Secondary | ICD-10-CM | POA: Insufficient documentation

## 2016-11-16 IMAGING — CT CT ABD-PEL WO/W CM
2 of 6 series · 13 of 32 positions shown, 18 images · IV contrast (APPLIED)
Comparison: [DATE]

CLINICAL DATA: Three-week history of urinary frequency and
microscopic hematuria. Intermittent episodes of gross hematuria.

EXAM:
CT ABDOMEN AND PELVIS WITHOUT AND WITH CONTRAST
TECHNIQUE: Multidetector CT imaging of the abdomen and pelvis was performed
following the standard protocol before and following the bolus
administration of intravenous contrast.
CONTRAST:  125mL [8F] IOPAMIDOL ([8F]) INJECTION 61%

[Series 2: axial pre · axial · non-contrast · 0.79mm/px · z∈[-834,-509]mm · 6 of 93 slices shown]
[im 14/93  soft-tissue]
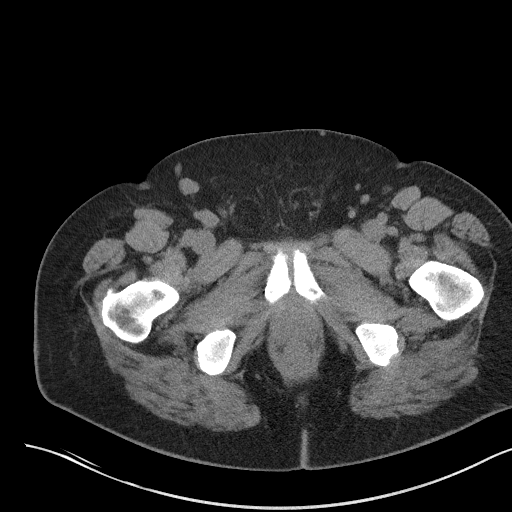
[im 27/93  soft-tissue]
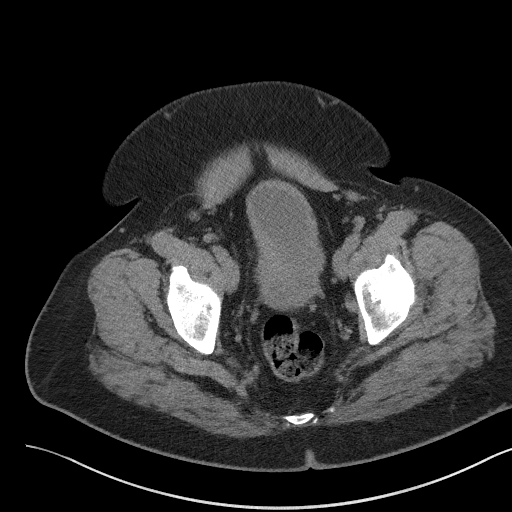
[im 40/93  soft-tissue]
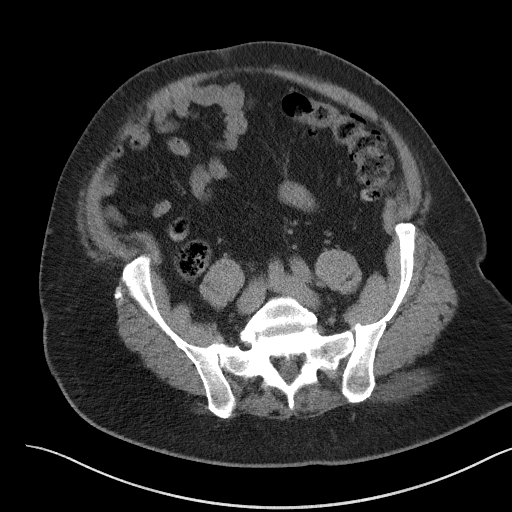
[im 53/93  soft-tissue]
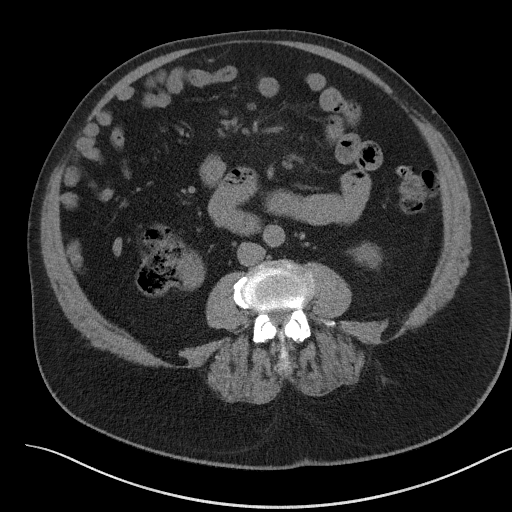
[im 66/93  soft-tissue]
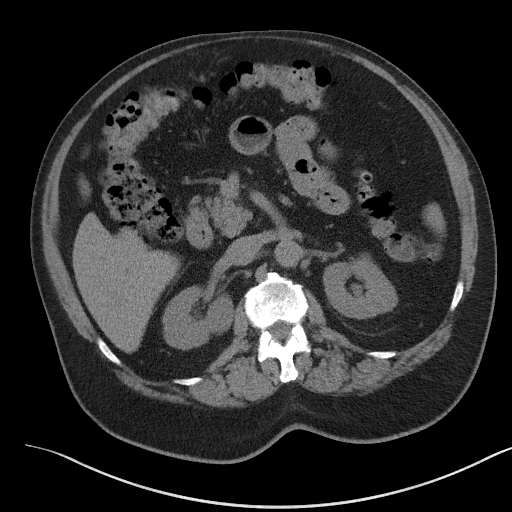
[im 79/93  soft-tissue]
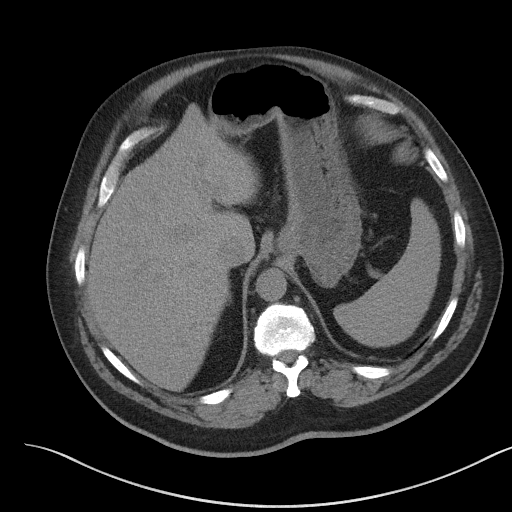

[Series 13: axial delay · axial · delayed · 0.79mm/px · z∈[-857,-482]mm · 7 of 101 slices shown, 12 images]
[im 13/101  soft-tissue]
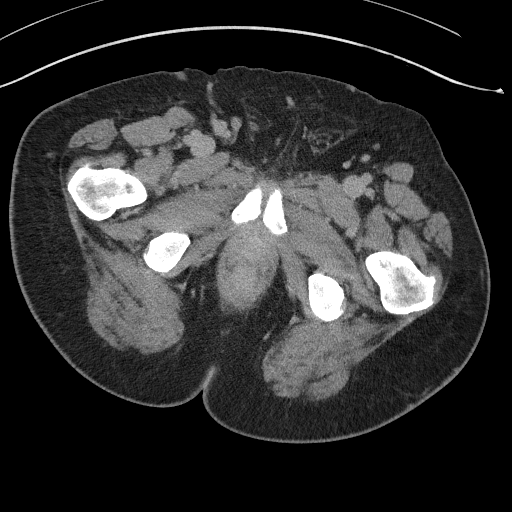
[im 13/101  bone]
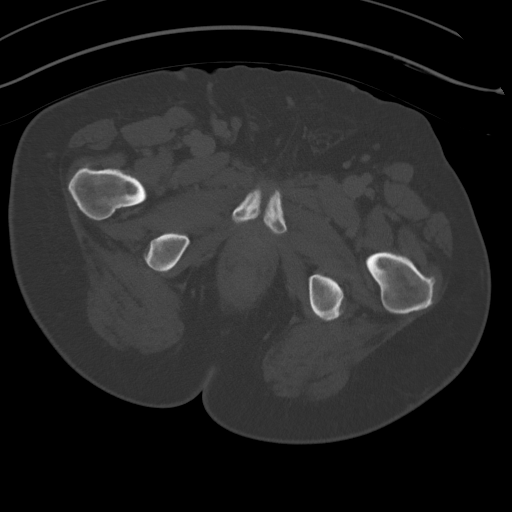
[im 26/101  soft-tissue]
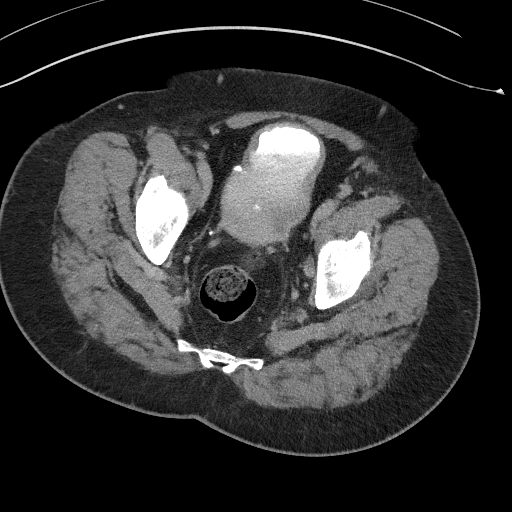
[im 38/101  soft-tissue]
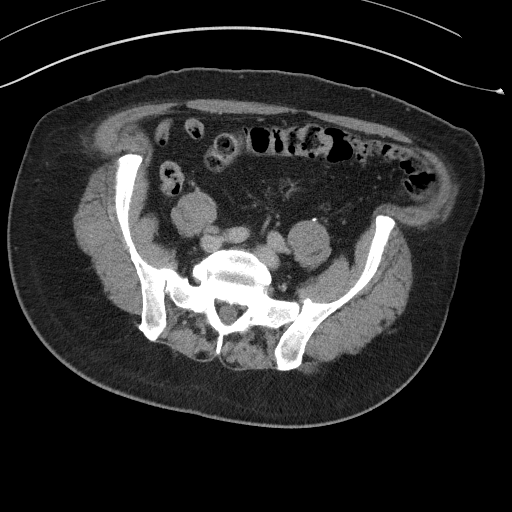
[im 51/101  soft-tissue]
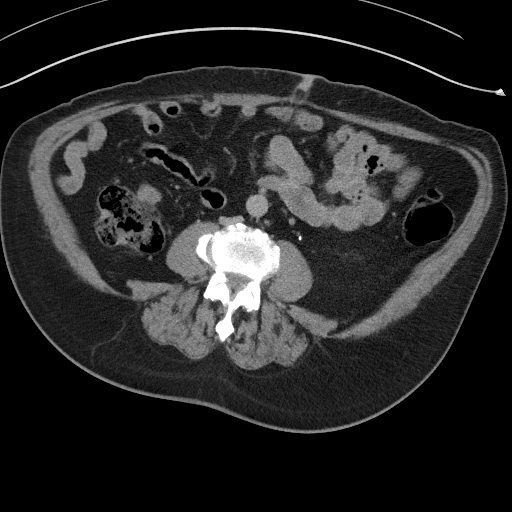
[im 51/101  lung]
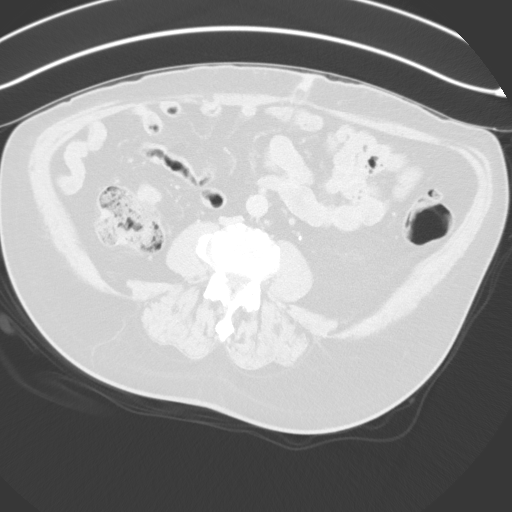
[im 63/101  soft-tissue]
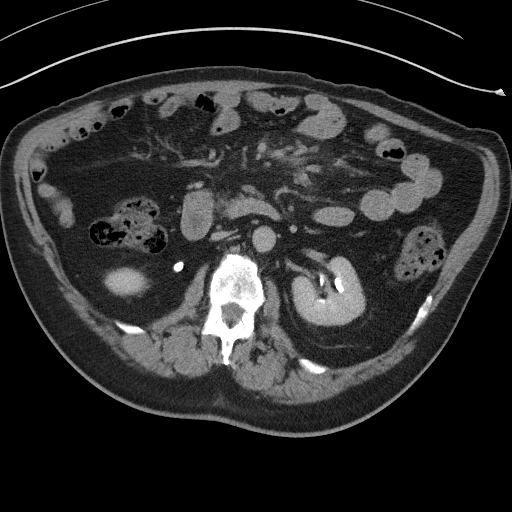
[im 63/101  lung]
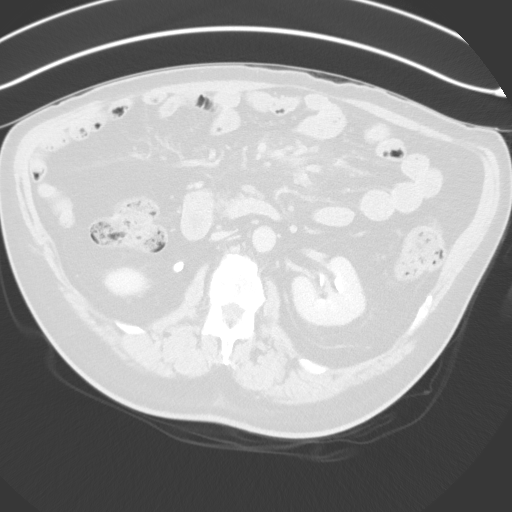
[im 76/101  soft-tissue]
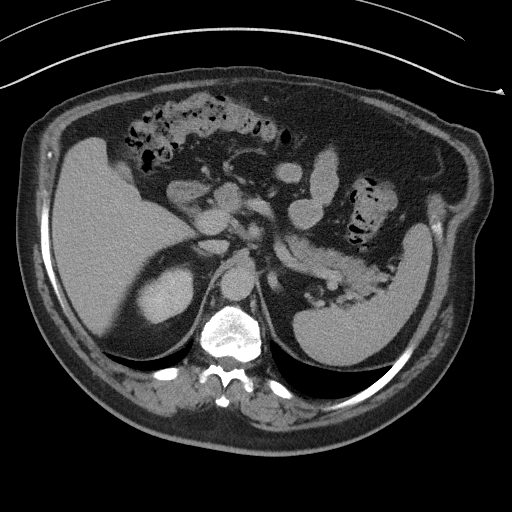
[im 76/101  lung]
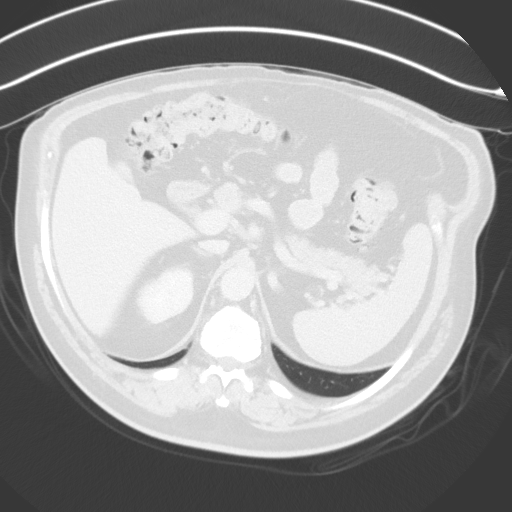
[im 88/101  soft-tissue]
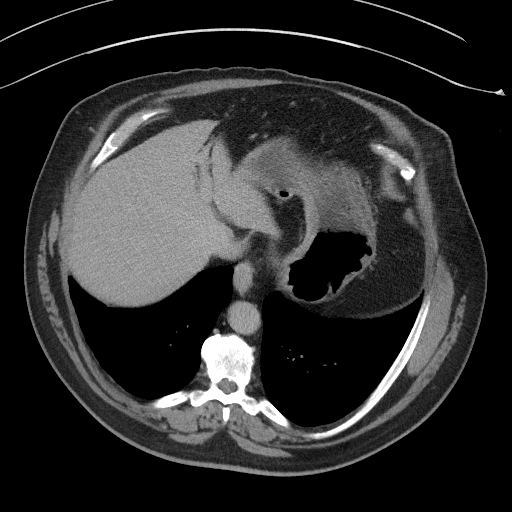
[im 88/101  lung]
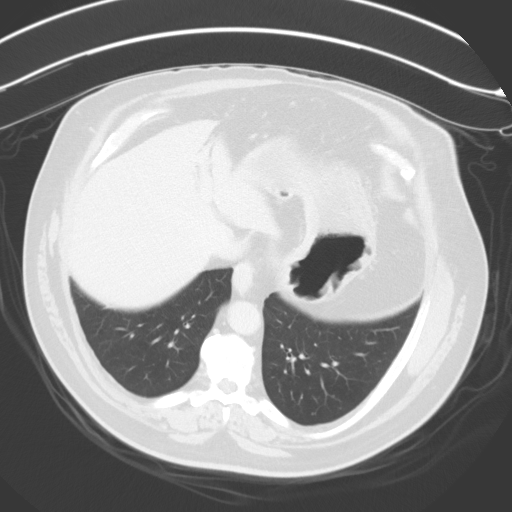

[13 of 32 positions shown; findings below may reference images not displayed]

FINDINGS: Lower chest: The lung bases are clear of acute process. No pleural
effusion or pulmonary lesions. The heart is normal in size. No
pericardial effusion. The distal esophagus and aorta are
unremarkable.

Hepatobiliary: No focal hepatic lesions or intrahepatic biliary
dilatation. Stable area of focal fatty change near the falciform
ligament. Stable calcified gallstone and fundal contraction which
could be a fundal adenoma myeloma. No common bile duct dilatation.

Pancreas: No mass, inflammation or ductal dilatation.

Spleen: Normal size.  No focal lesions.

Adrenals/Urinary Tract: Stable left adrenal gland nodule measuring
less than 10 Hounsfield units and consistent with a benign adenoma.
The right adrenal gland is normal.

No renal, ureteral or bladder calculi.

After contrast administration both kidneys demonstrate normal
enhancement/ perfusion. No worrisome renal lesions. The delayed
images do not demonstrate any significant collecting system
abnormalities. Both ureters are normal.

Mild diffuse bladder wall thickening, trabeculation and cellules
suggesting partial bladder outlet obstruction. Prostate gland is
enlarged with median lobe hypertrophy impressing on the base of
bladder. No bladder mass.

Stomach/Bowel: The stomach, duodenum, small bowel and colon are
grossly normal without oral contrast. No inflammatory changes, mass
lesions or obstructive findings. The terminal ileum and appendix are
normal. Significant descending and upper sigmoid colon
diverticulosis without definite findings for acute diverticulitis.

Vascular/Lymphatic: No significant vascular findings are present. No
enlarged abdominal or pelvic lymph nodes.

Reproductive: Enlarged prostate gland. The seminal vesicles are
normal.

Other: No pelvic mass or adenopathy. No free pelvic fluid
collections. No inguinal mass or adenopathy. No abdominal wall
hernia or subcutaneous lesions.

Musculoskeletal: No significant bony findings.
IMPRESSION: 1. No CT findings to account for the patient's hematuria. No renal,
ureteral or bladder calculi or mass.
2. Enlarged prostate gland with median lobe hypertrophy. There is
likely partial bladder outlet obstruction with a mildly thickened
bladder wall, trabeculation and cellules. No bladder mass.
3. Cholelithiasis.
4. No acute abdominal/pelvic findings, mass lesions or
lymphadenopathy.
5. Advanced descending and sigmoid diverticulosis.
6. Stable left adrenal gland adenoma.

## 2016-11-16 MED ORDER — IOPAMIDOL (ISOVUE-300) INJECTION 61%
125.0000 mL | Freq: Once | INTRAVENOUS | Status: AC | PRN
  Administered 2016-11-16: 125 mL via INTRAVENOUS

## 2016-11-16 NOTE — Telephone Encounter (Signed)
Pass due on labs. Left detail voicemail asking pt to come in for labs.

## 2016-11-24 ENCOUNTER — Encounter: Payer: Self-pay | Admitting: Urology

## 2016-11-24 ENCOUNTER — Ambulatory Visit (INDEPENDENT_AMBULATORY_CARE_PROVIDER_SITE_OTHER): Payer: 59 | Admitting: Urology

## 2016-11-24 VITALS — BP 159/82 | HR 89 | Ht 70.0 in | Wt 211.6 lb

## 2016-11-24 DIAGNOSIS — R3129 Other microscopic hematuria: Secondary | ICD-10-CM | POA: Diagnosis not present

## 2016-11-24 DIAGNOSIS — R972 Elevated prostate specific antigen [PSA]: Secondary | ICD-10-CM

## 2016-11-24 LAB — MICROSCOPIC EXAMINATION: BACTERIA UA: NONE SEEN

## 2016-11-24 LAB — URINALYSIS, COMPLETE
BILIRUBIN UA: NEGATIVE
KETONES UA: NEGATIVE
LEUKOCYTES UA: NEGATIVE
Nitrite, UA: NEGATIVE
PH UA: 7 (ref 5.0–7.5)
RBC UA: NEGATIVE
SPEC GRAV UA: 1.02 (ref 1.005–1.030)
UUROB: 0.2 mg/dL (ref 0.2–1.0)

## 2016-11-24 MED ORDER — CIPROFLOXACIN HCL 500 MG PO TABS
500.0000 mg | ORAL_TABLET | Freq: Once | ORAL | Status: AC
  Administered 2016-11-24: 500 mg via ORAL

## 2016-11-24 MED ORDER — LIDOCAINE HCL 2 % EX GEL
1.0000 "application " | Freq: Once | CUTANEOUS | Status: AC
  Administered 2016-11-24: 1 via URETHRAL

## 2016-11-24 NOTE — Progress Notes (Signed)
   11/24/16  CC: No chief complaint on file.   HPI:  The patient is a 66 year old gentleman presents today for follow-up of his microscopic hematuria.  1. Microscopic hematuria Incidental finding on urinalysis. CT urogram is normal. Due for cystoscopy today. Had previous negative hematuria work up in 2015  2. Elevated PSA PSA was found to be 8.6 ng/mL on 10/10/2016 during a routine screening.  Patient was seen by Carlyle Lipa, NP in 2015 for a PSA of 9.5.  He did undergo a biopsy at that time and the biopsy was negative.  His repeated PSA returned at 8.7 six months later. PSA was 9.2 wit hfree PSA of 27.3% in February 2018.  3. BPH Recent IPSS was 12/4. He was started on finasteride in February 2018.   There were no vitals taken for this visit. NED. A&Ox3.   No respiratory distress   Abd soft, NT, ND Normal phallus with bilateral descended testicles  Cystoscopy Procedure Note  Patient identification was confirmed, informed consent was obtained, and patient was prepped using Betadine solution.  Lidocaine jelly was administered per urethral meatus.    Preoperative abx where received prior to procedure.     Pre-Procedure: - Inspection reveals a normal caliber ureteral meatus.  Procedure: The flexible cystoscope was introduced without difficulty - No urethral strictures/lesions are present. - Enlarged prostate Visually obstructive, 6 cm in length, and hypervascular - Normal bladder neck - Bilateral ureteral orifices identified - Bladder mucosa  reveals no ulcers, tumors, or lesions - No bladder stones - Mild trabeculation. One small diverticulum noted in posterior wall of bladder  Retroflexion shows intravesical lobe   Post-Procedure: - Patient tolerated the procedure well  Assessment/ Plan:  1. Microscopic hematuria -likely from hypervascular prostate. No evidence of malignancy or stones. Finasteride for his BPH should help improve his microscopic  hematuria  2. Elevated PSA -Stable. Repeat PSA in 6 months. Will need to take into account that the patient was started on finasteride after his last PSA  3. BPH -continue finasteride

## 2016-11-27 ENCOUNTER — Encounter: Payer: Self-pay | Admitting: Family Medicine

## 2016-11-27 NOTE — Progress Notes (Signed)
Eye exam updated

## 2017-01-05 ENCOUNTER — Ambulatory Visit (INDEPENDENT_AMBULATORY_CARE_PROVIDER_SITE_OTHER): Payer: 59 | Admitting: Family Medicine

## 2017-01-05 ENCOUNTER — Encounter: Payer: Self-pay | Admitting: Family Medicine

## 2017-01-05 DIAGNOSIS — E66811 Obesity, class 1: Secondary | ICD-10-CM

## 2017-01-05 DIAGNOSIS — E669 Obesity, unspecified: Secondary | ICD-10-CM

## 2017-01-05 DIAGNOSIS — R809 Proteinuria, unspecified: Secondary | ICD-10-CM

## 2017-01-05 DIAGNOSIS — E119 Type 2 diabetes mellitus without complications: Secondary | ICD-10-CM | POA: Diagnosis not present

## 2017-01-05 DIAGNOSIS — E785 Hyperlipidemia, unspecified: Secondary | ICD-10-CM | POA: Diagnosis not present

## 2017-01-05 DIAGNOSIS — R3121 Asymptomatic microscopic hematuria: Secondary | ICD-10-CM

## 2017-01-05 DIAGNOSIS — L84 Corns and callosities: Secondary | ICD-10-CM

## 2017-01-05 DIAGNOSIS — E1129 Type 2 diabetes mellitus with other diabetic kidney complication: Secondary | ICD-10-CM | POA: Diagnosis not present

## 2017-01-05 DIAGNOSIS — I10 Essential (primary) hypertension: Secondary | ICD-10-CM | POA: Diagnosis not present

## 2017-01-05 MED ORDER — LISINOPRIL 2.5 MG PO TABS: 2.5000 mg | ORAL_TABLET | Freq: Every day | ORAL | 2 refills | Status: DC

## 2017-01-05 NOTE — Progress Notes (Signed)
BP (!) 146/88   Pulse 83   Temp 98.1 F (36.7 C) (Oral)   Resp 16   Wt 211 lb (95.7 kg)   SpO2 96%   BMI 30.28 kg/m   Today's Vitals   01/05/17 1525 01/05/17 1526 01/05/17 1548  BP: (!) 146/88    Pulse: (!) 120 (!) 112 83  Resp: 16    Temp: 98.1 F (36.7 C)    TempSrc: Oral    SpO2: 96%    Weight: 211 lb (95.7 kg)    Note: recheck pulse by MD, normal rate and rhythm; patient was asymptomatic, suspect the pulse ox machine not reliable  Subjective:    Patient ID: Scott Woods, male    DOB: 09/19/1951, 66 y.o.   MRN: [id number]  HPI: Scott Woods is a 66 y.o. male  Chief Complaint  Patient presents with  . Follow-up    3 months     HPI Patient is here for routine follow-up He has type 2 diabetes mellitus; on metformin; not checking sugars with my blessings; avoids sugary drinks Last a1c was 6.4 in January; prior A1c was July 2017  High cholesterol; on statin; not many eggs, maybe a biscuit with scrambled eggs on a weekend; eats hamburgers and hot dogs, baked chicken and fish; last steak was Christmas  He has seen the urologist about his elevated PSA; patient says it isn't cancer; on finasteride; had blood in urine and was evaluated  Upon review of his problem list, he says that he had blood in stools two years ago, went to see the specialist; they did colonoscopy (he has actually had colonoscopy x 2); no problems since, no further blood  Depression screen [facility] 2/9 01/05/2017 10/10/2016 03/28/2016 10/09/2015 05/08/2015  Decreased Interest 0 0 0 0 0  Down, Depressed, Hopeless 0 0 0 0 0  PHQ - 2 Score 0 0 0 0 0   Relevant past medical, surgical, family and social history reviewed Past Medical History:  Diagnosis Date  . Adrenal nodule ([place])   . Borderline diabetes   . Chronic kidney disease   . CKD (chronic kidney disease), stage II   . Diabetes mellitus without complication ([place])   . Elevated PSA   . Gross hematuria   . History of hepatitis B   . HTN  (hypertension)   . HTN, goal below 150/90   . Hyperlipidemia   . Hyperlipidemia with target LDL less than 100   . Obesity   . Prostate cancer [facility])    Past Surgical History:  Procedure Laterality Date  . ADENOIDECTOMY    . COLONOSCOPY  11/18/2014  . PROCTOSCOPY  04/2014  . TONSILLECTOMY     Family History  Problem Relation Age of Onset  . Parkinson's disease Brother   . Heart disease Mother   . Diabetes Mellitus II Mother   . Coronary artery disease Mother   . Heart disease Father   . Prostate cancer Neg Hx   . Kidney cancer Neg Hx   . Bladder Cancer Neg Hx    Social History  Substance Use Topics  . Smoking status: Never Smoker  . Smokeless tobacco: Never Used  . Alcohol use No   Interim medical history since last visit reviewed. Allergies and medications reviewed  Review of Systems  Constitutional: Negative for unexpected weight change.  Respiratory: Negative for shortness of breath.   Cardiovascular: Negative for chest pain and leg swelling.   Per HPI unless specifically indicated above  Objective:    BP (!) 146/88   Pulse 83   Temp 98.1 F (36.7 C) (Oral)   Resp 16   Wt 211 lb (95.7 kg)   SpO2 96%   BMI 30.28 kg/m   Wt Readings from Last 3 Encounters:  01/05/17 211 lb (95.7 kg)  11/24/16 211 lb 9.6 oz (96 kg)  11/02/16 207 lb 3.2 oz (94 kg)    Physical Exam  Constitutional: He appears well-developed and well-nourished. No distress.  HENT:  Head: Normocephalic and atraumatic.  Eyes: EOM are normal. No scleral icterus.  Neck: No thyromegaly present.  Cardiovascular: Normal rate and regular rhythm.   Pulmonary/Chest: Effort normal and breath sounds normal.  Abdominal: Soft. Bowel sounds are normal. He exhibits no distension.  Musculoskeletal: He exhibits no edema.  Neurological: Coordination normal.  Skin: Skin is warm and Woods. No pallor.  Psychiatric: He has a normal mood and affect. His behavior is normal. Judgment and thought content normal.    Diabetic Foot Form - Detailed   Diabetic Foot Exam - detailed Diabetic Foot exam was performed with the following findings:  Yes 01/05/2017  3:53 PM  Are the toenails long?:  Yes Are the toenails ingrown?:  No Normal Range of Motion:  Yes Pulse Foot Exam completed.:  Yes  Right Dorsalis Pedis:  Present Left Dorsalis Pedis:  Present  Sensory Foot Exam Completed.:  Yes Swelling:  No Semmes-Weinstein Monofilament Test R Site 1-Great Toe:  Pos L Site 1-Great Toe:  Pos  R Site 4:  Pos L Site 4:  Pos  R Site 5:  Pos L Site 5:  Pos    Comments:  Calluses on the flexor surface of the RIGHT great toe, LEFT great toe, sole of foot proximal to base of 4th toe on LEFT foot; pared calluses down with #10 blade with good results     Results for orders placed or performed in visit on 11/27/16  HM DIABETES EYE EXAM  Result Value Ref Range   HM Diabetic Eye Exam No Retinopathy No Retinopathy      Assessment & Plan:   Problem List Items Addressed This Visit      Cardiovascular and Mediastinum   Hypertension, goal below 140/90    Not controlled; start ACE-I; return in 2 weeks for recheck BP      Relevant Medications   lisinopril (PRINIVIL,ZESTRIL) 2.5 MG tablet     Endocrine   Microalbuminuria due to type 2 diabetes mellitus ([place])    Start back on ACE-I; recheck labs in 2 weeks      Relevant Medications   lisinopril (PRINIVIL,ZESTRIL) 2.5 MG tablet   Diabetes mellitus type 2, controlled, without complications ([facility])    Check labs; foot exam by MD today; avoid sugary drinks and white bread; eye exams every year, UTD; start back on ACE-I for renal protection      Relevant Medications   lisinopril (PRINIVIL,ZESTRIL) 2.5 MG tablet     Musculoskeletal and Integument   Pre-ulcerative calluses    Pared down both great toes and ball of foot left foot        Other   Obesity, Class I, BMI 30-34.9    Encouraged weight loss; see AVS      Hyperlipidemia LDL goal <100    Try to limit  saturated fats      Relevant Medications   lisinopril (PRINIVIL,ZESTRIL) 2.5 MG tablet   Blood in the urine    Resolved and evaluated by urologist  Follow up plan: Return in about 2 weeks (around 01/20/2017) for blood pressure recheck with CMA, fasting labs.  An after-visit summary was printed and given to the patient at [facility].  Please see the patient instructions which may contain other information and recommendations beyond what is mentioned above in the assessment and plan.  Meds ordered this encounter  Medications  . lisinopril (PRINIVIL,ZESTRIL) 2.5 MG tablet    Sig: Take 1 tablet (2.5 mg total) by mouth daily. (labs due now please)    Dispense:  30 tablet    Refill:  2    No orders of the defined types were placed in this encounter.

## 2017-01-05 NOTE — Assessment & Plan Note (Signed)
Pared down both great toes and ball of foot left foot

## 2017-01-05 NOTE — Assessment & Plan Note (Addendum)
Not controlled; start ACE-I; return in 2 weeks for recheck BP

## 2017-01-05 NOTE — Patient Instructions (Signed)
Stay well-hydrated to keep your urine really pale yellow Start the lisinopril Return in about two weeks for blood pressure recheck and fasting labs Try to limit saturated fats in your diet (bologna, hot dogs, barbeque, cheeseburgers, hamburgers, steak, bacon, sausage, cheese, etc.) and get more fresh fruits, vegetables, and whole grains Check out the information at familydoctor.org entitled "Nutrition for Weight Loss: What You Need to Know about Fad Diets" Try to lose between 1-2 pounds per week by taking in fewer calories and burning off more calories You can succeed by limiting portions, limiting foods dense in calories and fat, becoming more active, and drinking 8 glasses of water a day (64 ounces) Don't skip meals, especially breakfast, as skipping meals may alter your metabolism Do not use over-the-counter weight loss pills or gimmicks that claim rapid weight loss A healthy BMI (or body mass index) is between 18.5 and 24.9 You can calculate your ideal BMI at the [place] website [employer]fr Please do see your eye doctor regularly, and have your eyes examined every year (or more often per his or her recommendation) Check your feet every night and let me know right away of any sores, infections, numbness, etc. Try to limit sweets, white bread, white rice, white potatoes It is okay with me for you to not check your fingerstick blood sugars (per [employer] of Endocrinology Best Practices), unless you are interested and feel it would be helpful for you

## 2017-01-05 NOTE — Assessment & Plan Note (Signed)
Check labs; foot exam by MD today; avoid sugary drinks and white bread; eye exams every year, UTD; start back on ACE-I for renal protection

## 2017-01-05 NOTE — Assessment & Plan Note (Signed)
Resolved and evaluated by urologist

## 2017-01-05 NOTE — Assessment & Plan Note (Signed)
Try to limit saturated fats 

## 2017-01-05 NOTE — Assessment & Plan Note (Signed)
Start back on ACE-I; recheck labs in 2 weeks

## 2017-01-05 NOTE — Assessment & Plan Note (Signed)
Encouraged weight loss; see AVS 

## 2017-01-20 ENCOUNTER — Ambulatory Visit (INDEPENDENT_AMBULATORY_CARE_PROVIDER_SITE_OTHER): Payer: 59

## 2017-01-20 ENCOUNTER — Other Ambulatory Visit: Payer: Self-pay | Admitting: Family Medicine

## 2017-01-20 VITALS — BP 126/74 | HR 84

## 2017-01-20 DIAGNOSIS — E119 Type 2 diabetes mellitus without complications: Secondary | ICD-10-CM

## 2017-01-20 DIAGNOSIS — I10 Essential (primary) hypertension: Secondary | ICD-10-CM

## 2017-01-20 DIAGNOSIS — R809 Proteinuria, unspecified: Secondary | ICD-10-CM

## 2017-01-20 DIAGNOSIS — N182 Chronic kidney disease, stage 2 (mild): Secondary | ICD-10-CM

## 2017-01-20 DIAGNOSIS — E1129 Type 2 diabetes mellitus with other diabetic kidney complication: Secondary | ICD-10-CM

## 2017-01-20 DIAGNOSIS — Z5181 Encounter for therapeutic drug level monitoring: Secondary | ICD-10-CM

## 2017-01-20 NOTE — Progress Notes (Signed)
Order's released

## 2017-01-21 LAB — LIPID PANEL
CHOL/HDL RATIO: 3.1 ratio (ref <5.0)
CHOLESTEROL: 148 mg/dL (ref <200)
HDL: 47 mg/dL (ref >40)
LDL Cholesterol: 83 mg/dL (ref <100)
Triglycerides: 91 mg/dL (ref <150)
VLDL: 18 mg/dL (ref <30)

## 2017-01-21 LAB — MICROALBUMIN / CREATININE URINE RATIO
Creatinine, Urine: 166 mg/dL (ref 20–370)
MICROALB UR: 9.8 mg/dL
Microalb Creat Ratio: 59 mcg/mg creat — ABNORMAL HIGH (ref <30)

## 2017-01-21 LAB — COMPLETE METABOLIC PANEL WITH GFR
ALT: 18 U/L (ref 9–46)
AST: 15 U/L (ref 10–35)
Albumin: 4.1 g/dL (ref 3.6–5.1)
Alkaline Phosphatase: 76 U/L (ref 40–115)
BUN: 27 mg/dL — AB (ref 7–25)
CHLORIDE: 107 mmol/L (ref 98–110)
CO2: 28 mmol/L (ref 20–31)
Calcium: 9 mg/dL (ref 8.6–10.3)
Creat: 1.15 mg/dL (ref 0.70–1.25)
GFR, Est African American: 77 mL/min (ref >=60)
GFR, Est Non African American: 67 mL/min (ref >=60)
GLUCOSE: 110 mg/dL — AB (ref 65–99)
POTASSIUM: 4.9 mmol/L (ref 3.5–5.3)
SODIUM: 141 mmol/L (ref 135–146)
Total Bilirubin: 0.6 mg/dL (ref 0.2–1.2)
Total Protein: 6.7 g/dL (ref 6.1–8.1)

## 2017-01-21 LAB — HEMOGLOBIN A1C
HEMOGLOBIN A1C: 6.4 % — AB (ref <5.7)
Mean Plasma Glucose: 137 mg/dL

## 2017-01-30 ENCOUNTER — Other Ambulatory Visit: Payer: Self-pay

## 2017-01-30 MED ORDER — ATORVASTATIN CALCIUM 10 MG PO TABS: 10.0000 mg | ORAL_TABLET | Freq: Every day | ORAL | 1 refills | Status: DC

## 2017-01-30 NOTE — Telephone Encounter (Signed)
Requesting 90 day

## 2017-01-30 NOTE — Telephone Encounter (Signed)
Sgpt and lipids reviewed; Rx approved

## 2017-02-03 ENCOUNTER — Other Ambulatory Visit: Payer: Self-pay | Admitting: Family Medicine

## 2017-02-03 ENCOUNTER — Other Ambulatory Visit: Payer: Self-pay

## 2017-02-03 DIAGNOSIS — E8881 Metabolic syndrome: Secondary | ICD-10-CM

## 2017-02-03 DIAGNOSIS — N401 Enlarged prostate with lower urinary tract symptoms: Secondary | ICD-10-CM

## 2017-02-03 MED ORDER — FINASTERIDE 5 MG PO TABS: 5.0000 mg | ORAL_TABLET | Freq: Every day | ORAL | 3 refills | Status: DC

## 2017-02-03 NOTE — Telephone Encounter (Signed)
Cr reviewed Rx approved

## 2017-02-08 ENCOUNTER — Telehealth: Payer: Self-pay

## 2017-02-08 NOTE — Telephone Encounter (Addendum)
Patient called to check status of finasteride Rx.  Advised showing sent to pharmacy on 05/18.  Pt was unable to p/u on 05/21.  Called pharmacy med requires pre-cert.  Pt would like to pay for med out of pocket for this month Advised pt pharmacy aware. Pt would like to proceed w/ Prior auth for next month.

## 2017-02-09 NOTE — Telephone Encounter (Signed)
PA was completed and APPROVED!

## 2017-02-20 ENCOUNTER — Encounter: Payer: Self-pay | Admitting: Family Medicine

## 2017-02-20 ENCOUNTER — Ambulatory Visit (INDEPENDENT_AMBULATORY_CARE_PROVIDER_SITE_OTHER): Payer: 59 | Admitting: Family Medicine

## 2017-02-20 DIAGNOSIS — R350 Frequency of micturition: Secondary | ICD-10-CM | POA: Diagnosis not present

## 2017-02-20 DIAGNOSIS — M25552 Pain in left hip: Secondary | ICD-10-CM | POA: Diagnosis not present

## 2017-02-20 DIAGNOSIS — R351 Nocturia: Secondary | ICD-10-CM

## 2017-02-20 LAB — POCT URINALYSIS DIPSTICK
BILIRUBIN UA: NEGATIVE
Glucose, UA: NEGATIVE
KETONES UA: NEGATIVE
LEUKOCYTES UA: NEGATIVE
NITRITE UA: NEGATIVE
RBC UA: NEGATIVE
Spec Grav, UA: 1.015 (ref 1.010–1.025)
Urobilinogen, UA: 0.2 E.U./dL
pH, UA: 5 (ref 5.0–8.0)

## 2017-02-20 MED ORDER — MELOXICAM 15 MG PO TABS: 15.0000 mg | ORAL_TABLET | Freq: Every day | ORAL | 0 refills | Status: DC

## 2017-02-20 NOTE — Progress Notes (Addendum)
Name: Scott Woods   MRN: [id number]    DOB: 1951-04-10   Date:02/20/2017       Progress Note  Subjective  Chief Complaint  Chief Complaint  Patient presents with  . Back Pain    for 2 weeks radiates into groin area    HPI  Pt presents with 2 week history of left groin/quadricep muscle strain - saw urgent care 2 weeks ago and was given Rx's for Naproxen and Vicodin, only filled the Naproxen and is now out of this medication. Naproxen was helping his pain, but pain would worsen if he didn't take the Naproxen. Pain radiates into left anterior thigh from the groin and is worse when he wakes up in the morning and after standing for several hours on the job.  He works two jobs and does a lot of standing/lifting, pivoting, and bending over for his job which irritates the pain. No bulging or masses in groin area. No testicular pain or swelling, no pain on urination or blood in pee. Does endorse some increased frequency of urination x2-3 days.  Pt takes finasteride for enlarged prostate, and this has been helping otherwise.  No fevers/chills, no redness/swelling of the area.  Patient Active Problem List   Diagnosis Date Noted  . Microalbuminuria due to type 2 diabetes mellitus ([place]) 01/05/2017  . Medication monitoring encounter 10/10/2016  . Pre-ulcerative calluses 03/28/2016  . Metabolic syndrome 53/97/6734  . Adrenal adenoma 05/08/2015  . Chronic kidney disease (CKD), stage II (mild) 05/08/2015  . Abnormal prostate specific antigen 05/08/2015  . Blood in the urine 05/08/2015  . H/O type B viral hepatitis 05/08/2015  . Hyperlipidemia LDL goal <100 05/08/2015  . Hypertension, goal below 140/90 05/08/2015  . Abnormal presence of protein in urine 05/08/2015  . Diabetes mellitus type 2, controlled, without complications ([place]) 19/37/9024  . Obesity, Class I, BMI 30-34.9 05/08/2015  . Adiposity 05/08/2015  . Right shoulder pain 05/08/2015    Social History  Substance Use Topics  . Smoking  status: Never Smoker  . Smokeless tobacco: Never Used  . Alcohol use No     Current Outpatient Prescriptions:  .  aspirin EC 81 MG tablet, Take 1 tablet (81 mg total) by mouth daily., Disp: , Rfl:  .  atorvastatin (LIPITOR) 10 MG tablet, Take 1 tablet (10 mg total) by mouth at bedtime., Disp: 90 tablet, Rfl: 1 .  finasteride (PROSCAR) 5 MG tablet, Take 1 tablet (5 mg total) by mouth daily., Disp: 90 tablet, Rfl: 3 .  lisinopril (PRINIVIL,ZESTRIL) 2.5 MG tablet, Take 1 tablet (2.5 mg total) by mouth daily. (labs due now please), Disp: 30 tablet, Rfl: 2 .  metFORMIN (GLUCOPHAGE) 500 MG tablet, TAKE 1 TABLET (500 MG TOTAL) BY MOUTH DAILY., Disp: 90 tablet, Rfl: 1 .  meloxicam (MOBIC) 15 MG tablet, Take 1 tablet (15 mg total) by mouth daily., Disp: 30 tablet, Rfl: 0  No Known Allergies  ROS  Ten systems reviewed and is negative except as mentioned in HPI  Objective  There were no vitals filed for this visit.  There is no height or weight on file to calculate BMI.  Nursing Note and Vital Signs reviewed.  Physical Exam  Constitutional: Patient appears well-developed and well-nourished. Obese, No distress.  HEENT: head atraumatic, normocephalic Cardiovascular: Normal rate, regular rhythm, S1/S2 present.  No murmur or rub heard. No BLE edema. Pulmonary/Chest: Effort normal and breath sounds clear. No respiratory distress or retractions. Abdominal: Soft and non-tender, bowel sounds present x4  quadrants. Psychiatric: Patient has a normal mood and affect. behavior is normal. Judgment and thought content normal. Musculoskeletal: No joint effusions. No gross deformities. Neurological: he is alert and oriented to person, place, and time. No cranial nerve deficit. Coordination, balance, strength, speech and gait are normal.   Constitutional: Patient appears well-developed and well-nourished. No distress.  HENT: Head: Normocephalic and atraumatic. Ears: B TMs ok, no erythema or effusion;  Nose: Nose normal. Mouth/Throat: Oropharynx is clear and moist. No oropharyngeal exudate.  Eyes: Conjunctivae and EOM are normal. Pupils are equal, round, and reactive to light. No scleral icterus.  Neck: Normal range of motion. Neck supple. No JVD present. No thyromegaly present.  Cardiovascular: Normal rate, regular rhythm and normal heart sounds.  No murmur heard. No BLE edema. MSK: Unable to reproduce pain with palpation. Full AROM of left hip, femoral and popliteal pulses intact, limb is warm and dry, no erythema, no rash. No crepitus to hip on movement.  MALE GENITALIA: Normal descended testes bilaterally, no masses palpated, no hernias, no lesions, no discharge  Recent Results (from the past 2160 hour(s))  Urinalysis, Complete     Status: Abnormal   Collection Time: 11/24/16  9:23 AM  Result Value Ref Range   Specific Gravity, UA 1.020 1.005 - 1.030   pH, UA 7.0 5.0 - 7.5   Color, UA Yellow Yellow   Appearance Ur Clear Clear   Leukocytes, UA Negative Negative   Protein, UA 1+ (A) Negative/Trace   Glucose, UA 1+ (A) Negative   Ketones, UA Negative Negative   RBC, UA Negative Negative   Bilirubin, UA Negative Negative   Urobilinogen, Ur 0.2 0.2 - 1.0 mg/dL   Nitrite, UA Negative Negative   Microscopic Examination See below:   Microscopic Examination     Status: None   Collection Time: 11/24/16  9:23 AM  Result Value Ref Range   WBC, UA 0-5 0 - 5 /hpf   RBC, UA 0-2 0 - 2 /hpf   Epithelial Cells (non renal) 0-10 0 - 10 /hpf   Bacteria, UA None seen None seen/Few  COMPLETE METABOLIC PANEL WITH GFR     Status: Abnormal   Collection Time: 01/20/17  3:30 PM  Result Value Ref Range   Sodium 141 135 - 146 mmol/L   Potassium 4.9 3.5 - 5.3 mmol/L   Chloride 107 98 - 110 mmol/L   CO2 28 20 - 31 mmol/L   Glucose, Bld 110 (H) 65 - 99 mg/dL   BUN 27 (H) 7 - 25 mg/dL   Creat 1.15 0.70 - 1.25 mg/dL    Comment:   For patients > or = 65 years of age: The upper reference limit  for Creatinine is approximately 13% higher for people identified as African-American.      Total Bilirubin 0.6 0.2 - 1.2 mg/dL   Alkaline Phosphatase 76 40 - 115 U/L   AST 15 10 - 35 U/L   ALT 18 9 - 46 U/L   Total Protein 6.7 6.1 - 8.1 g/dL   Albumin 4.1 3.6 - 5.1 g/dL   Calcium 9.0 8.6 - 10.3 mg/dL   GFR, Est African American 77 >=60 mL/min   GFR, Est Non African American 67 >=60 mL/min  Hemoglobin A1c     Status: Abnormal   Collection Time: 01/20/17  3:30 PM  Result Value Ref Range   Hgb A1c MFr Bld 6.4 (H) <5.7 %    Comment:   For someone without known diabetes, a hemoglobin  A1c value between 5.7% and 6.4% is consistent with prediabetes and should be confirmed with a follow-up test.   For someone with known diabetes, a value <7% indicates that their diabetes is well controlled. A1c targets should be individualized based on duration of diabetes, age, co-morbid conditions and other considerations.   This assay result is consistent with an increased risk of diabetes.   Currently, no consensus exists regarding use of hemoglobin A1c for diagnosis of diabetes in children.      Mean Plasma Glucose 137 mg/dL  Lipid panel     Status: None   Collection Time: 01/20/17  3:30 PM  Result Value Ref Range   Cholesterol 148 <200 mg/dL   Triglycerides 91 <150 mg/dL   HDL 47 >40 mg/dL   Total CHOL/HDL Ratio 3.1 <5.0 Ratio   VLDL 18 <30 mg/dL   LDL Cholesterol 83 <100 mg/dL  Microalbumin / creatinine urine ratio     Status: Abnormal   Collection Time: 01/20/17  3:30 PM  Result Value Ref Range   Creatinine, Urine 166 20 - 370 mg/dL   Microalb, Ur 9.8 Not estab mg/dL   Microalb Creat Ratio 59 (H) <30 mcg/mg creat    Comment: The ADA has defined abnormalities in albumin excretion as follows:           Category           Result                            (mcg/mg creatinine)                 Normal:    <30       Microalbuminuria:    30 - 299   Clinical albuminuria:    > or = 300    The ADA recommends that at least two of three specimens collected within a 3 - 6 month period be abnormal before considering a patient to be within a diagnostic category.     POCT urinalysis dipstick     Status: None   Collection Time: 02/20/17  4:15 PM  Result Value Ref Range   Color, UA yellow    Clarity, UA clear    Glucose, UA negative    Bilirubin, UA negative    Ketones, UA negative    Spec Grav, UA 1.015 1.010 - 1.025   Blood, UA negative    pH, UA 5.0 5.0 - 8.0   Protein, UA trace    Urobilinogen, UA 0.2 0.2 or 1.0 E.U./dL   Nitrite, UA negative    Leukocytes, UA Negative Negative     Assessment & Plan  1. Frequent urination - POCT urinalysis dipstick - negative  2. Nocturia - POCT urinalysis dipstick - negative and this result is shared with patient in office today  3. Left hip pain - meloxicam (MOBIC) 15 MG tablet; Take 1 tablet (15 mg total) by mouth daily.  Dispense: 30 tablet; Refill: 0 -Advised to take Mobic As needed and always with food. - Discussed conservative measures including rest, ice and NSAID treatment. - Ambulatory referral to Orthopedic Surgery due to unclear etiology and ongoing symptoms  - Note for work provided for 2 days for patient to rest, take Mobic as prescribed, and ice as needed. -Red flags and when to present for emergency care or RTC including fever >101.23F, chest pain, shortness of breath, new/worsening/un-resolving symptoms, weakness, saddle anesthesia reviewed with patient at time of visit.  Follow up and care instructions discussed and provided in AVS.  --------------------------------------- I have reviewed this encounter including the documentation in this note and/or discussed this patient with the Johney Maine, FNP, NP-C. I am certifying that I agree with the content of this note as supervising physician.  Enid Derry, [facility] 02/22/2017, 2:33 PM

## 2017-03-19 ENCOUNTER — Other Ambulatory Visit: Payer: Self-pay | Admitting: Family Medicine

## 2017-03-19 DIAGNOSIS — M25552 Pain in left hip: Secondary | ICD-10-CM

## 2017-03-19 NOTE — Telephone Encounter (Signed)
Please call and clarify with patient - has he seen ortho already? If not, when is his appointment?  I can refill only up to his appointment as this is not a medication I want him on long-term. If he has seen ortho already, I cannot refill and he needs to ask them what they want him to do for pain. Thanks!

## 2017-04-04 ENCOUNTER — Other Ambulatory Visit: Payer: Self-pay | Admitting: Family Medicine

## 2017-05-24 ENCOUNTER — Other Ambulatory Visit: Payer: 59

## 2017-05-24 DIAGNOSIS — R972 Elevated prostate specific antigen [PSA]: Secondary | ICD-10-CM

## 2017-05-25 LAB — FPSA% REFLEX
% FREE PSA: 22.4 %
PSA, FREE: 1.14 ng/mL

## 2017-05-25 LAB — PSA TOTAL (REFLEX TO FREE): Prostate Specific Ag, Serum: 5.1 ng/mL — ABNORMAL HIGH (ref 0.0–4.0)

## 2017-05-26 ENCOUNTER — Ambulatory Visit: Payer: Managed Care, Other (non HMO)

## 2017-05-28 NOTE — Progress Notes (Signed)
05/29/2017 4:54 PM   Scott Woods Nov 04, 1950 [id number]  Referring provider: Arnetha Courser, MD [address] [place], [place] [address]  Chief Complaint  Patient presents with  . Elevated PSA    6 month follow up    HPI: 66 yo WM with a history of hematuria, elevated PSA and BPH with LUTS who presents today for 6 month follow-up.  Elevated PSA 66 yo WM who is referred by Scott Woods for an elevated PSA.  PSA was found to be 8.6 ng/mL on 10/10/2016 during a routine screening.  Patient was seen by Scott Lipa, NP in 2015 for a PSA of 9.5.  He did undergo a biopsy at that time and the biopsy was negative.  His repeated PSA returned at 8.7 six months later.  He was to be followed on a biannual basis, but he did not return for follow up.  Repeated PSA in 02/18 was 9.2.  Now it is 5.1.  His IPSS score today is 4, which is mild lower urinary tract symptomatology. He is pleased with his quality life due to his urinary symptoms.  His previous I PSS score was 12/4.  His previous PVR was 53 mL.  His major complaint today frequency, nocturia and intermittency.  He has had these symptoms for the few weeks.  He denies any dysuria, hematuria or suprapubic pain.   He also denies any recent fevers, chills, nausea or vomiting.   He does not have a family history of PCa.      IPSS    Row Name 05/29/17 1600         International Prostate Symptom Score   How often have you had the sensation of not emptying your bladder? Not at All     How often have you had to urinate less than every two hours? Not at All     How often have you found you stopped and started again several times when you urinated? Less than 1 in 5 times     How often have you found it difficult to postpone urination? Less than 1 in 5 times     How often have you had a weak urinary stream? Less than 1 in 5 times     How often have you had to strain to start urination? Not at All     How many times did you typically  get up at night to urinate? 1 Time     Total IPSS Score 4       Quality of Life due to urinary symptoms   If you were to spend the rest of your life with your urinary condition just the way it is now how would you feel about that? Pleased        Score:  1-7 Mild 8-19 Moderate 20-35 Severe  History of hematuria He also had a hematuria workup for gross hematuria in 2015 with CT Urogram demonstrating a 1.2 cm left adrenal nodule.  Cystoscopy was negative.  Repeated hematuria workup with CTU and cystoscopy noted Enlarged prostate Visually obstructive, 6 cm in length, and hypervascular and Mild trabeculation. One small diverticulum noted in posterior wall of bladder.     PMH: Past Medical History:  Diagnosis Date  . Adrenal nodule ([place])   . Borderline diabetes   . Chronic kidney disease   . CKD (chronic kidney disease), stage II   . Diabetes mellitus without complication ([place])   . Elevated PSA   . Johney Woods  hematuria   . History of hepatitis B   . HTN (hypertension)   . HTN, goal below 150/90   . Hyperlipidemia   . Hyperlipidemia with target LDL less than 100   . Obesity   . Prostate cancer [facility])     Surgical History: Past Surgical History:  Procedure Laterality Date  . ADENOIDECTOMY    . COLONOSCOPY  11/18/2014  . PROCTOSCOPY  04/2014  . TONSILLECTOMY      Home Medications:  Allergies as of 05/29/2017   No Known Allergies     Medication List       Accurate as of 05/29/17  4:54 PM. Always use your most recent med list.          aspirin EC 81 MG tablet Take 1 tablet (81 mg total) by mouth daily.   atorvastatin 10 MG tablet Commonly known as:  LIPITOR Take 1 tablet (10 mg total) by mouth at bedtime.   finasteride 5 MG tablet Commonly known as:  PROSCAR Take 1 tablet (5 mg total) by mouth daily.   HYDROcodone-acetaminophen 5-325 MG tablet Commonly known as:  NORCO/VICODIN Take by mouth.   lisinopril 2.5 MG tablet Commonly known as:  PRINIVIL,ZESTRIL Take 1  tablet (2.5 mg total) by mouth daily.   meloxicam 15 MG tablet Commonly known as:  MOBIC Take 1 tablet (15 mg total) by mouth daily.   metFORMIN 500 MG tablet Commonly known as:  GLUCOPHAGE TAKE 1 TABLET (500 MG TOTAL) BY MOUTH DAILY.            Discharge Care Instructions        Start     Ordered   05/29/17 0000  finasteride (PROSCAR) 5 MG tablet  Daily    Question:  Supervising Provider  Answer:  Scott Woods   05/29/17 1654      Allergies: No Known Allergies  Family History: Family History  Problem Relation Age of Onset  . Parkinson's disease Brother   . Heart disease Mother   . Diabetes Mellitus II Mother   . Coronary artery disease Mother   . Heart disease Father   . Prostate cancer Neg Hx   . Kidney cancer Neg Hx   . Bladder Cancer Neg Hx     Social History:  reports that he has never smoked. He has never used smokeless tobacco. He reports that he does not drink alcohol or use drugs.  ROS: UROLOGY Frequent Urination?: No Hard to postpone urination?: No Burning/pain with urination?: No Get up at night to urinate?: No Leakage of urine?: No Urine stream starts and stops?: No Trouble starting stream?: No Do you have to strain to urinate?: No Blood in urine?: No Urinary tract infection?: No Sexually transmitted disease?: No Injury to kidneys or bladder?: No Painful intercourse?: No Weak stream?: No Erection problems?: No Penile pain?: No  Gastrointestinal Nausea?: No Vomiting?: No Indigestion/heartburn?: No Diarrhea?: No Constipation?: No  Constitutional Fever: No Night sweats?: No Weight loss?: No Fatigue?: No  Skin Skin rash/lesions?: No Itching?: No  Eyes Blurred vision?: No Double vision?: No  Ears/Nose/Throat Sore throat?: No Sinus problems?: No  Hematologic/Lymphatic Swollen glands?: No Easy bruising?: No  Cardiovascular Leg swelling?: No Chest pain?: No  Respiratory Cough?: Yes Shortness of breath?:  No  Endocrine Excessive thirst?: No  Musculoskeletal Back pain?: No Joint pain?: No  Neurological Headaches?: No Dizziness?: No  Psychologic Depression?: No Anxiety?: No  Physical Exam: BP (!) 141/80   Pulse 91   Ht 5\' 9"  (  1.753 m)   Wt 211 lb 11.2 oz (96 kg)   BMI 31.26 kg/m   Constitutional: Well nourished. Alert and oriented, No acute distress. HEENT: [place] AT, moist mucus membranes. Trachea midline, no masses. Cardiovascular: No clubbing, cyanosis, or edema. Respiratory: Normal respiratory effort, no increased work of breathing. GI: Abdomen is soft, non tender, non distended, no abdominal masses. Liver and spleen not palpable.  No hernias appreciated.  Stool sample for occult testing is not indicated.   GU: No CVA tenderness.  No bladder fullness or masses.  Patient with circumcised phallus.  Urethral meatus is patent.  No penile discharge. No penile lesions or rashes. Scrotum without lesions, cysts, rashes and/or edema.  Testicles are located scrotally bilaterally. No masses are appreciated in the testicles. Left and right epididymis are normal. Rectal: Patient with  normal sphincter tone. Anus and perineum without scarring or rashes. No rectal masses are appreciated. Prostate is approximately 60 grams, no nodules are appreciated. Seminal vesicles are normal. Skin: No rashes, bruises or suspicious lesions. Lymph: No cervical or inguinal adenopathy. Neurologic: Grossly intact, no focal deficits, moving all 4 extremities. Psychiatric: Normal mood and affect.  Laboratory Data: Lab Results  Component Value Date   WBC 8.7 10/10/2016   HGB 13.4 10/10/2016   HCT 40.5 10/10/2016   MCV 88.2 10/10/2016   PLT 216 10/10/2016    Lab Results  Component Value Date   CREATININE 1.15 01/20/2017    Lab Results  Component Value Date   PSA 8.6 (H) 10/10/2016       PSA                        5.1                                                                   05/24/2017  Lab  Results  Component Value Date   HGBA1C 6.4 (H) 01/20/2017       Component Value Date/Time   CHOL 148 01/20/2017 1530   HDL 47 01/20/2017 1530   CHOLHDL 3.1 01/20/2017 1530   VLDL 18 01/20/2017 1530   LDLCALC 83 01/20/2017 1530    Lab Results  Component Value Date   AST 15 01/20/2017   Lab Results  Component Value Date   ALT 18 01/20/2017    I reviewed the labs   Assessment & Plan:    1. Elevated PSA  - PSA is reducing with finasteride  - RTC in 6 months with PSA and exam  2. History of hematuria  - hematuria workup in 2015 and 2018 - no worrisome lesions discovered  - RTC in one year for UA  3. BPH with LUTS  - IPSS score is 4/1, it is improving  - Continue conservative management, avoiding bladder irritants and timed voiding's  - continue finasteride 5 mg daily; refills given  - RTC in 6 months for I PSS, PSA and exam    Return in about 6 months (around 11/26/2017) for IPSS, PSA and exam.  These notes generated with voice recognition software. I apologize for typographical errors.  Zara Council, [facility] [address], [place], [place] [address] [phone]

## 2017-05-29 ENCOUNTER — Ambulatory Visit: Payer: 59 | Admitting: Urology

## 2017-05-29 ENCOUNTER — Encounter: Payer: Self-pay | Admitting: Urology

## 2017-05-29 VITALS — BP 141/80 | HR 91 | Ht 69.0 in | Wt 211.7 lb

## 2017-05-29 DIAGNOSIS — N401 Enlarged prostate with lower urinary tract symptoms: Secondary | ICD-10-CM | POA: Diagnosis not present

## 2017-05-29 DIAGNOSIS — R972 Elevated prostate specific antigen [PSA]: Secondary | ICD-10-CM | POA: Diagnosis not present

## 2017-05-29 DIAGNOSIS — Z87448 Personal history of other diseases of urinary system: Secondary | ICD-10-CM | POA: Diagnosis not present

## 2017-05-29 DIAGNOSIS — N138 Other obstructive and reflux uropathy: Secondary | ICD-10-CM

## 2017-05-29 MED ORDER — FINASTERIDE 5 MG PO TABS: 5.0000 mg | ORAL_TABLET | Freq: Every day | ORAL | 3 refills | Status: DC

## 2017-08-07 ENCOUNTER — Telehealth: Payer: Self-pay | Admitting: Family Medicine

## 2017-08-07 DIAGNOSIS — E8881 Metabolic syndrome: Secondary | ICD-10-CM

## 2017-08-07 NOTE — Telephone Encounter (Signed)
Last Cr reviewed; Rx approved ---------------------------------------- Please ask patient to schedule a visit in the next two weeks or so for his diabetes and cholesterol; please have him come fasting when he sees me I'll refill med Thank you

## 2017-08-08 NOTE — Telephone Encounter (Signed)
lvm informing that prescription has been sent to pharmacy and for him to give the office a call to schedule an appointment.

## 2017-08-14 ENCOUNTER — Other Ambulatory Visit: Payer: Self-pay

## 2017-08-14 DIAGNOSIS — E8881 Metabolic syndrome: Secondary | ICD-10-CM

## 2017-08-14 MED ORDER — METFORMIN HCL 500 MG PO TABS: 500.0000 mg | ORAL_TABLET | Freq: Every day | ORAL | 0 refills | Status: DC

## 2017-08-16 ENCOUNTER — Ambulatory Visit: Payer: 59 | Admitting: Family Medicine

## 2017-08-16 ENCOUNTER — Encounter: Payer: Self-pay | Admitting: Family Medicine

## 2017-08-16 VITALS — BP 138/82 | HR 113 | Temp 97.6°F | Ht 69.0 in | Wt 213.6 lb

## 2017-08-16 DIAGNOSIS — L84 Corns and callosities: Secondary | ICD-10-CM

## 2017-08-16 DIAGNOSIS — R000 Tachycardia, unspecified: Secondary | ICD-10-CM | POA: Diagnosis not present

## 2017-08-16 DIAGNOSIS — R809 Proteinuria, unspecified: Secondary | ICD-10-CM

## 2017-08-16 DIAGNOSIS — E785 Hyperlipidemia, unspecified: Secondary | ICD-10-CM

## 2017-08-16 DIAGNOSIS — E1129 Type 2 diabetes mellitus with other diabetic kidney complication: Secondary | ICD-10-CM

## 2017-08-16 DIAGNOSIS — E119 Type 2 diabetes mellitus without complications: Secondary | ICD-10-CM

## 2017-08-16 DIAGNOSIS — Z5181 Encounter for therapeutic drug level monitoring: Secondary | ICD-10-CM | POA: Diagnosis not present

## 2017-08-16 DIAGNOSIS — N182 Chronic kidney disease, stage 2 (mild): Secondary | ICD-10-CM | POA: Diagnosis not present

## 2017-08-16 DIAGNOSIS — E669 Obesity, unspecified: Secondary | ICD-10-CM

## 2017-08-16 DIAGNOSIS — E66811 Obesity, class 1: Secondary | ICD-10-CM

## 2017-08-16 DIAGNOSIS — I10 Essential (primary) hypertension: Secondary | ICD-10-CM

## 2017-08-16 NOTE — Assessment & Plan Note (Signed)
Left great toe; encouraged patient to keep an eye on this

## 2017-08-16 NOTE — Assessment & Plan Note (Signed)
Check liver and kidneys 

## 2017-08-16 NOTE — Assessment & Plan Note (Signed)
Try to avoid NSAIDs, stay well-hydrated

## 2017-08-16 NOTE — Assessment & Plan Note (Signed)
Check A1c today and urine microalbumin; try to limit sugars

## 2017-08-16 NOTE — Progress Notes (Signed)
BP 138/82 (BP Location: Left Arm, Patient Position: Sitting, Cuff Size: Normal)   Pulse (!) 113   Temp 97.6 F (36.4 C) (Oral)   Ht 5\' 9"  (1.753 m)   Wt 213 lb 9.6 oz (96.9 kg)   SpO2 95%   BMI 31.54 kg/m    Subjective:    Patient ID: Scott Woods, male    DOB: 01-Jul-1951, 66 y.o.   MRN: [id number]  HPI: Scott Woods is a 66 y.o. male  Chief Complaint  Patient presents with  . Medication Refill    HPI He is here for follow-up of several chronic issues  He has type 2 diabetes Lab Results  Component Value Date   HGBA1C 6.4 (H) 01/20/2017  not checking FSBS with my blessing No problems with his feet He has positive urine microalbumin and was started on an ACE-I  High PSA; he went to urologist; being followed and it came down the last check; he refuses to have another biopsy he tells me today  High cholesterol; does eat fatty meats, would be hard for him to stop that he says  He is obese; he does not think he can lose weight  He declined hepatitis C or HIV testing when offered He does not want a flu shot or pneumonia shot  Depression screen [facility] 2/9 08/16/2017 01/05/2017 10/10/2016 03/28/2016 10/09/2015  Decreased Interest 0 0 0 0 0  Down, Depressed, Hopeless 0 0 0 0 0  PHQ - 2 Score 0 0 0 0 0    Relevant past medical, surgical, family and social history reviewed Past Medical History:  Diagnosis Date  . Adrenal nodule ([place])   . Borderline diabetes   . Chronic kidney disease   . CKD (chronic kidney disease), stage II   . Diabetes mellitus without complication ([place])   . Elevated PSA   . Gross hematuria   . History of hepatitis B   . HTN (hypertension)   . Hyperlipidemia with target LDL less than 100   . Obesity   . Prostate cancer [facility])    Past Surgical History:  Procedure Laterality Date  . ADENOIDECTOMY    . COLONOSCOPY  11/18/2014  . PROCTOSCOPY  04/2014  . TONSILLECTOMY     Family History  Problem Relation Age of Onset  . Parkinson's disease  Brother   . Heart disease Mother   . Diabetes Mellitus II Mother   . Coronary artery disease Mother   . Heart disease Father   . Prostate cancer Neg Hx   . Kidney cancer Neg Hx   . Bladder Cancer Neg Hx    Social History   Tobacco Use  . Smoking status: Never Smoker  . Smokeless tobacco: Never Used  Substance Use Topics  . Alcohol use: No    Alcohol/week: 0.0 oz  . Drug use: No    Interim medical history since last visit reviewed. Allergies and medications reviewed  Review of Systems Per HPI unless specifically indicated above     Objective:    BP 138/82 (BP Location: Left Arm, Patient Position: Sitting, Cuff Size: Normal)   Pulse (!) 113   Temp 97.6 F (36.4 C) (Oral)   Ht 5\' 9"  (1.753 m)   Wt 213 lb 9.6 oz (96.9 kg)   SpO2 95%   BMI 31.54 kg/m   Wt Readings from Last 3 Encounters:  08/16/17 213 lb 9.6 oz (96.9 kg)  05/29/17 211 lb 11.2 oz (96 kg)  01/05/17 211 lb (95.7  kg)    Physical Exam  Constitutional: He appears well-developed and well-nourished. No distress.  HENT:  Head: Normocephalic and atraumatic.  Eyes: EOM are normal. No scleral icterus.  Neck: No thyromegaly present.  Cardiovascular: Normal rate and regular rhythm.  Pulmonary/Chest: Effort normal and breath sounds normal.  Abdominal: Soft. Bowel sounds are normal. He exhibits no distension.  Musculoskeletal: He exhibits no edema.  Neurological: He is alert.  Skin: Skin is warm and Woods. No pallor.  Psychiatric: He has a normal mood and affect. His behavior is normal. Judgment and thought content normal.   Diabetic Foot Form - Detailed   Diabetic Foot Exam - detailed Diabetic Foot exam was performed with the following findings:  Yes 08/16/2017  2:39 PM  Visual Foot Exam completed.:  Yes  Pulse Foot Exam completed.:  Yes  Right Dorsalis Pedis:  Present Left Dorsalis Pedis:  Present  Sensory Foot Exam Completed.:  Yes Semmes-Weinstein Monofilament Test R Site 1-Great Toe:  Pos L Site 1-Great  Toe:  Pos    Comments:  Callus LEFT great toe medially     Results for orders placed or performed in visit on 05/24/17  PSA Total (Reflex To Free)  Result Value Ref Range   Prostate Specific Ag, Serum 5.1 (H) 0.0 - 4.0 ng/mL   Reflex Criteria Comment   %fPSA Reflex  Result Value Ref Range   PSA, FREE 1.14 N/A ng/mL   % FREE PSA 22.4 %      Assessment & Plan:   Problem List Items Addressed This Visit      Cardiovascular and Mediastinum   Hypertension, goal below 140/90    Controlled with systolic under 124, as AAFP guidelines stand with old JNC-8 guidelines, not newer recommendations from 2017 by [facility]        Endocrine   Microalbuminuria due to type 2 diabetes mellitus ([place])    Check urine      Relevant Orders   Microalbumin / creatinine urine ratio   Diabetes mellitus type 2, controlled, without complications (HCC)    Check A1c today and urine microalbumin; try to limit sugars      Relevant Orders   Microalbumin / creatinine urine ratio   Hemoglobin A1c     Musculoskeletal and Integument   Pre-ulcerative calluses    Left great toe; encouraged patient to keep an eye on this        Genitourinary   Chronic kidney disease (CKD), stage II (mild)    Try to avoid NSAIDs, stay well-hydrated        Other   Obesity, Class I, BMI 30-34.9    Patient will try to lose some weight before next visit      Medication monitoring encounter    Check liver and kidneys      Relevant Orders   COMPLETE METABOLIC PANEL WITH GFR   Hyperlipidemia LDL goal <100    Check lipids (fasting); try to limit saturated fats      Relevant Orders   Lipid panel    Other Visit Diagnoses    Type 2 diabetes mellitus without complication, without long-term current use of insulin ([place])    -  Primary   Relevant Orders   Hemoglobin A1c   Lipid panel   Hemoglobin A1c   Tachycardia       Relevant Orders   CBC with Differential/Platelet   TSH       Follow up plan: Return in about 3  months (around 11/16/2017) for twenty  minute follow-up with fasting labs.  An after-visit summary was printed and given to the patient at [place].  Please see the patient instructions which may contain other information and recommendations beyond what is mentioned above in the assessment and plan.  No orders of the defined types were placed in this encounter.   Orders Placed This Encounter  Procedures  . Hemoglobin A1c  . Microalbumin / creatinine urine ratio  . Lipid panel  . Hemoglobin A1c  . COMPLETE METABOLIC PANEL WITH GFR  . CBC with Differential/Platelet  . TSH

## 2017-08-16 NOTE — Assessment & Plan Note (Signed)
Check urine.

## 2017-08-16 NOTE — Patient Instructions (Addendum)
I do recommend yearly flu shots; for individuals who don't want flu shots, try to practice excellent hand hygiene, and avoid nursing homes, day cares, and hospitals during peak flu season; taking additional vitamin C daily during flu/cold season may help boost your immune system too  We'll get labs today Keep an eye on the callus on your foot and let me know right away of any redness or infection or problem Try to lose 10-15 pounds before your next visit Try to follow the DASH guidelines  DASH Eating Plan DASH stands for "Dietary Approaches to Stop Hypertension." The DASH eating plan is a healthy eating plan that has been shown to reduce high blood pressure (hypertension). It may also reduce your risk for type 2 diabetes, heart disease, and stroke. The DASH eating plan may also help with weight loss. What are tips for following this plan? General guidelines  Avoid eating more than 2,300 mg (milligrams) of salt (sodium) a day. If you have hypertension, you may need to reduce your sodium intake to 1,500 mg a day.  Limit alcohol intake to no more than 1 drink a day for nonpregnant women and 2 drinks a day for men. One drink equals 12 oz of beer, 5 oz of wine, or 1 oz of hard liquor.  Work with your health care provider to maintain a healthy body weight or to lose weight. Ask what an ideal weight is for you.  Get at least 30 minutes of exercise that causes your heart to beat faster (aerobic exercise) most days of the week. Activities may include walking, swimming, or biking.  Work with your health care provider or diet and nutrition specialist (dietitian) to adjust your eating plan to your individual calorie needs. Reading food labels  Check food labels for the amount of sodium per serving. Choose foods with less than 5 percent of the Daily Value of sodium. Generally, foods with less than 300 mg of sodium per serving fit into this eating plan.  To find whole grains, look for the word "whole" as  the first word in the ingredient list. Shopping  Buy products labeled as "low-sodium" or "no salt added."  Buy fresh foods. Avoid canned foods and premade or frozen meals. Cooking  Avoid adding salt when cooking. Use salt-free seasonings or herbs instead of table salt or sea salt. Check with your health care provider or pharmacist before using salt substitutes.  Do not fry foods. Cook foods using healthy methods such as baking, boiling, grilling, and broiling instead.  Cook with heart-healthy oils, such as olive, canola, soybean, or sunflower oil. Meal planning   Eat a balanced diet that includes: ? 5 or more servings of fruits and vegetables each day. At each meal, try to fill half of your plate with fruits and vegetables. ? Up to 6-8 servings of whole grains each day. ? Less than 6 oz of lean meat, poultry, or fish each day. A 3-oz serving of meat is about the same size as a deck of cards. One egg equals 1 oz. ? 2 servings of low-fat dairy each day. ? A serving of nuts, seeds, or beans 5 times each week. ? Heart-healthy fats. Healthy fats called Omega-3 fatty acids are found in foods such as flaxseeds and coldwater fish, like sardines, salmon, and mackerel.  Limit how much you eat of the following: ? Canned or prepackaged foods. ? Food that is high in trans fat, such as fried foods. ? Food that is high in saturated  fat, such as fatty meat. ? Sweets, desserts, sugary drinks, and other foods with added sugar. ? Full-fat dairy products.  Do not salt foods before eating.  Try to eat at least 2 vegetarian meals each week.  Eat more home-cooked food and less restaurant, buffet, and fast food.  When eating at a restaurant, ask that your food be prepared with less salt or no salt, if possible. What foods are recommended? The items listed may not be a complete list. Talk with your dietitian about what dietary choices are best for you. Grains Whole-grain or whole-wheat bread.  Whole-grain or whole-wheat pasta. Brown rice. [place]. Bulgur. Whole-grain and low-sodium cereals. Pita bread. Low-fat, low-sodium crackers. Whole-wheat flour tortillas. Vegetables Fresh or frozen vegetables (raw, steamed, roasted, or grilled). Low-sodium or reduced-sodium tomato and vegetable juice. Low-sodium or reduced-sodium tomato sauce and tomato paste. Low-sodium or reduced-sodium canned vegetables. Fruits All fresh, dried, or frozen fruit. Canned fruit in natural juice (without added sugar). Meat and other protein foods Skinless chicken or [place]. Ground chicken or [place]. Pork with fat trimmed off. Fish and seafood. Egg whites. Dried beans, peas, or lentils. Unsalted nuts, nut butters, and seeds. Unsalted canned beans. Lean cuts of beef with fat trimmed off. Low-sodium, lean deli meat. Dairy Low-fat (1%) or fat-free (skim) milk. Fat-free, low-fat, or reduced-fat cheeses. Nonfat, low-sodium ricotta or cottage cheese. Low-fat or nonfat yogurt. Low-fat, low-sodium cheese. Fats and oils Soft margarine without trans fats. Vegetable oil. Low-fat, reduced-fat, or light mayonnaise and salad dressings (reduced-sodium). Canola, safflower, olive, soybean, and sunflower oils. Avocado. Seasoning and other foods Herbs. Spices. Seasoning mixes without salt. Unsalted popcorn and pretzels. Fat-free sweets. What foods are not recommended? The items listed may not be a complete list. Talk with your dietitian about what dietary choices are best for you. Grains Baked goods made with fat, such as croissants, muffins, or some breads. Dry pasta or rice meal packs. Vegetables Creamed or fried vegetables. Vegetables in a cheese sauce. Regular canned vegetables (not low-sodium or reduced-sodium). Regular canned tomato sauce and paste (not low-sodium or reduced-sodium). Regular tomato and vegetable juice (not low-sodium or reduced-sodium). Angie Fava. Olives. Fruits Canned fruit in a light or heavy syrup.  Fried fruit. Fruit in cream or butter sauce. Meat and other protein foods Fatty cuts of meat. Ribs. Fried meat. Berniece Salines. Sausage. Bologna and other processed lunch meats. Salami. Fatback. Hotdogs. Bratwurst. Salted nuts and seeds. Canned beans with added salt. Canned or smoked fish. Whole eggs or egg yolks. Chicken or [place] with skin. Dairy Whole or 2% milk, cream, and half-and-half. Whole or full-fat cream cheese. Whole-fat or sweetened yogurt. Full-fat cheese. Nondairy creamers. Whipped toppings. Processed cheese and cheese spreads. Fats and oils Butter. Stick margarine. Lard. Shortening. Ghee. Bacon fat. Tropical oils, such as coconut, palm kernel, or palm oil. Seasoning and other foods Salted popcorn and pretzels. Onion salt, garlic salt, seasoned salt, table salt, and sea salt. Worcestershire sauce. Tartar sauce. Barbecue sauce. Teriyaki sauce. Soy sauce, including reduced-sodium. Steak sauce. Canned and packaged gravies. Fish sauce. Oyster sauce. Cocktail sauce. Horseradish that you find on the shelf. Ketchup. Mustard. Meat flavorings and tenderizers. Bouillon cubes. Hot sauce and Tabasco sauce. Premade or packaged marinades. Premade or packaged taco seasonings. Relishes. Regular salad dressings. Where to find more information:  National Heart, Lung, and [place]: https://wilson-eaton.com/  American Heart Association: www.heart.org Summary  The DASH eating plan is a healthy eating plan that has been shown to reduce high blood pressure (hypertension). It may also reduce  your risk for type 2 diabetes, heart disease, and stroke.  With the DASH eating plan, you should limit salt (sodium) intake to 2,300 mg a day. If you have hypertension, you may need to reduce your sodium intake to 1,500 mg a day.  When on the DASH eating plan, aim to eat more fresh fruits and vegetables, whole grains, lean proteins, low-fat dairy, and heart-healthy fats.  Work with your health care provider or diet and  nutrition specialist (dietitian) to adjust your eating plan to your individual calorie needs. This information is not intended to replace advice given to you by your health care provider. Make sure you discuss any questions you have with your health care provider. Document Released: 08/25/2011 Document Revised: 08/29/2016 Document Reviewed: 08/29/2016 Elsevier Interactive Patient Education  2017 [place] are small areas of thickened skin that occur on the top, sides, or tip of a toe. They contain a cone-shaped core with a point that can press on a nerve below. This causes pain. Calluses are areas of thickened skin that can occur anywhere on the body including hands, fingers, palms, soles of the feet, and heels.Calluses are usually larger than corns. What are the causes? Corns and calluses are caused by rubbing (friction) or pressure, such as from shoes that are too tight or do not fit properly. What increases the risk? Corns are more likely to develop in people who have toe deformities, such as hammer toes. Since calluses can occur with friction to any area of the skin, calluses are more likely to develop in people who:  Work with their hands.  Wear shoes that fit poorly, shoes that are too tight, or shoes that are high-heeled.  Have toes deformities.  What are the signs or symptoms? Symptoms of a corn or callus include:  A hard growth on the skin.  Pain or tenderness under the skin.  Redness and swelling.  Increased discomfort while wearing tight-fitting shoes.  How is this diagnosed? Corns and calluses may be diagnosed with a medical history and physical exam. How is this treated? Corns and calluses may be treated with:  Removing the cause of the friction or pressure. This may include: ? Changing your shoes. ? Wearing shoe inserts (orthotics) or other protective layers in your shoes, such as a corn pad. ? Wearing gloves.  Medicines to help  soften skin in the hardened, thickened areas.  Reducing the size of the corn or callus by removing the dead layers of skin.  Antibiotic medicines to treat infection.  Surgery, if a toe deformity is the cause.  Follow these instructions at home:  Take medicines only as directed by your health care provider.  If you were prescribed an antibiotic, finish all of it even if you start to feel better.  Wear shoes that fit well. Avoid wearing high-heeled shoes and shoes that are too tight or too loose.  Wear any padding, protective layers, gloves, or orthotics as directed by your health care provider.  Soak your hands or feet and then use a file or pumice stone to soften your corn or callus. Do this as directed by your health care provider.  Check your corn or callus every day for signs of infection. Watch for: ? Redness, swelling, or pain. ? Fluid, blood, or pus. Contact a health care provider if:  Your symptoms do not improve with treatment.  You have increased redness, swelling, or pain at the site of your corn or callus.  You have fluid, blood, or pus coming from your corn or callus.  You have new symptoms. This information is not intended to replace advice given to you by your health care provider. Make sure you discuss any questions you have with your health care provider. Document Released: 06/11/2004 Document Revised: 03/25/2016 Document Reviewed: 09/01/2014 Elsevier Interactive Patient Education  2018 [employer].  Cholesterol Cholesterol is a fat. Your body needs a small amount of cholesterol. Cholesterol (plaque) may build up in your blood vessels (arteries). That makes you more likely to have a heart attack or stroke. You cannot feel your cholesterol level. Having a blood test is the only way to find out if your level is high. Keep your test results. Work with your doctor to keep your cholesterol at a good level. What do the results mean?  Total cholesterol is how much  cholesterol is in your blood.  LDL is bad cholesterol. This is the type that can build up. Try to have low LDL.  HDL is good cholesterol. It cleans your blood vessels and carries LDL away. Try to have high HDL.  Triglycerides are fat that the body can store or burn for energy. What are good levels of cholesterol?  Total cholesterol below 200.  LDL below 100 is good for people who have health risks. LDL below 70 is good for people who have very high risks.  HDL above 40 is good. It is best to have HDL of 60 or higher.  Triglycerides below 150. How can I lower my cholesterol? Diet Follow your diet program as told by your doctor.  Choose fish, white meat chicken, or [place] that is roasted or baked. Try not to eat red meat, fried foods, sausage, or lunch meats.  Eat lots of fresh fruits and vegetables.  Choose whole grains, beans, pasta, potatoes, and cereals.  Choose olive oil, corn oil, or canola oil. Only use small amounts.  Try not to eat butter, mayonnaise, shortening, or palm kernel oils.  Try not to eat foods with trans fats.  Choose low-fat or nonfat dairy foods. ? Drink skim or nonfat milk. ? Eat low-fat or nonfat yogurt and cheeses. ? Try not to drink whole milk or cream. ? Try not to eat ice cream, egg yolks, or full-fat cheeses.  Healthy desserts include angel food cake, ginger snaps, animal crackers, hard candy, popsicles, and low-fat or nonfat frozen yogurt. Try not to eat pastries, cakes, pies, and cookies.  Exercise Follow your exercise program as told by your doctor.  Be more active. Try gardening, walking, and taking the stairs.  Ask your doctor about ways that you can be more active.  Medicine  Take over-the-counter and prescription medicines only as told by your doctor.  This information is not intended to replace advice given to you by your health care provider. Make sure you discuss any questions you have with your health care provider. Document  Released: 12/02/2008 Document Revised: 04/06/2016 Document Reviewed: 03/17/2016 Elsevier Interactive Patient Education  2017 [employer].

## 2017-08-16 NOTE — Assessment & Plan Note (Signed)
Check lipids (fasting); try to limit saturated fats

## 2017-08-16 NOTE — Assessment & Plan Note (Signed)
Controlled with systolic under 312, as AAFP guidelines stand with old JNC-8 guidelines, not newer recommendations from 2017 by [facility]

## 2017-08-16 NOTE — Assessment & Plan Note (Signed)
Patient will try to lose some weight before next visit

## 2017-08-17 LAB — COMPLETE METABOLIC PANEL WITH GFR
AG Ratio: 1.3 (calc) (ref 1.0–2.5)
ALKALINE PHOSPHATASE (APISO): 102 U/L (ref 40–115)
ALT: 14 U/L (ref 9–46)
AST: 13 U/L (ref 10–35)
Albumin: 3.8 g/dL (ref 3.6–5.1)
BUN / CREAT RATIO: 23 (calc) — AB (ref 6–22)
BUN: 26 mg/dL — AB (ref 7–25)
CO2: 24 mmol/L (ref 20–32)
CREATININE: 1.11 mg/dL (ref 0.70–1.25)
Calcium: 8.7 mg/dL (ref 8.6–10.3)
Chloride: 105 mmol/L (ref 98–110)
GFR, EST NON AFRICAN AMERICAN: 69 mL/min/{1.73_m2} (ref >=60)
GFR, Est African American: 80 mL/min/{1.73_m2} (ref >=60)
GLOBULIN: 2.9 g/dL (ref 1.9–3.7)
Glucose, Bld: 203 mg/dL — ABNORMAL HIGH (ref 65–99)
Potassium: 4.4 mmol/L (ref 3.5–5.3)
SODIUM: 137 mmol/L (ref 135–146)
Total Bilirubin: 0.5 mg/dL (ref 0.2–1.2)
Total Protein: 6.7 g/dL (ref 6.1–8.1)

## 2017-08-17 LAB — CBC WITH DIFFERENTIAL/PLATELET
BASOS PCT: 0.3 %
Basophils Absolute: 27 cells/uL (ref 0–200)
EOS ABS: 198 {cells}/uL (ref 15–500)
Eosinophils Relative: 2.2 %
HEMATOCRIT: 38.4 % — AB (ref 38.5–50.0)
Hemoglobin: 13.4 g/dL (ref 13.2–17.1)
LYMPHS ABS: 1746 {cells}/uL (ref 850–3900)
MCH: 30.9 pg (ref 27.0–33.0)
MCHC: 34.9 g/dL (ref 32.0–36.0)
MCV: 88.7 fL (ref 80.0–100.0)
MPV: 10 fL (ref 7.5–12.5)
Monocytes Relative: 5.6 %
NEUTROS PCT: 72.5 %
Neutro Abs: 6525 cells/uL (ref 1500–7800)
Platelets: 215 10*3/uL (ref 140–400)
RBC: 4.33 10*6/uL (ref 4.20–5.80)
RDW: 12.3 % (ref 11.0–15.0)
TOTAL LYMPHOCYTE: 19.4 %
WBC: 9 10*3/uL (ref 3.8–10.8)
WBCMIX: 504 {cells}/uL (ref 200–950)

## 2017-08-17 LAB — MICROALBUMIN / CREATININE URINE RATIO
CREATININE, URINE: 153 mg/dL (ref 20–320)
Microalb Creat Ratio: 31 mcg/mg creat — ABNORMAL HIGH (ref <30)
Microalb, Ur: 4.8 mg/dL

## 2017-08-17 LAB — HEMOGLOBIN A1C
Hgb A1c MFr Bld: 6.8 % of total Hgb — ABNORMAL HIGH (ref <5.7)
Mean Plasma Glucose: 148 (calc)
eAG (mmol/L): 8.2 (calc)

## 2017-08-17 LAB — LIPID PANEL
CHOL/HDL RATIO: 3.9 (calc) (ref <5.0)
CHOLESTEROL: 151 mg/dL (ref <200)
HDL: 39 mg/dL — AB (ref >40)
LDL CHOLESTEROL (CALC): 75 mg/dL
Non-HDL Cholesterol (Calc): 112 mg/dL (calc) (ref <130)
TRIGLYCERIDES: 279 mg/dL — AB (ref <150)

## 2017-08-17 LAB — TSH: TSH: 2.09 mIU/L (ref 0.40–4.50)

## 2017-08-27 ENCOUNTER — Other Ambulatory Visit: Payer: Self-pay | Admitting: Family Medicine

## 2017-08-27 NOTE — Telephone Encounter (Signed)
Last LDL and sgpt reviewed; Rx approved

## 2017-09-09 ENCOUNTER — Other Ambulatory Visit: Payer: Self-pay | Admitting: Family Medicine

## 2017-09-09 NOTE — Telephone Encounter (Signed)
Reviewed last urine microalb:Cr and K+ and Cr Rx approved

## 2017-11-16 ENCOUNTER — Encounter: Payer: Self-pay | Admitting: Family Medicine

## 2017-11-16 ENCOUNTER — Ambulatory Visit: Payer: 59 | Admitting: Family Medicine

## 2017-11-16 DIAGNOSIS — E663 Overweight: Secondary | ICD-10-CM | POA: Diagnosis not present

## 2017-11-16 DIAGNOSIS — N182 Chronic kidney disease, stage 2 (mild): Secondary | ICD-10-CM | POA: Diagnosis not present

## 2017-11-16 DIAGNOSIS — I10 Essential (primary) hypertension: Secondary | ICD-10-CM | POA: Diagnosis not present

## 2017-11-16 DIAGNOSIS — E1129 Type 2 diabetes mellitus with other diabetic kidney complication: Secondary | ICD-10-CM | POA: Diagnosis not present

## 2017-11-16 DIAGNOSIS — R809 Proteinuria, unspecified: Secondary | ICD-10-CM | POA: Diagnosis not present

## 2017-11-16 DIAGNOSIS — E785 Hyperlipidemia, unspecified: Secondary | ICD-10-CM | POA: Diagnosis not present

## 2017-11-16 NOTE — Assessment & Plan Note (Addendum)
Foot exam; eye exam recommended and I showed him pictures of diabetic eye changes; check A1c every 6 months if A1c less than 7; patient did not want labs today anyway; reviewed his last 3 urine microalbumin:Creatinine

## 2017-11-16 NOTE — Assessment & Plan Note (Addendum)
Check lipids at next visit (patient did not want labs today); praise given for weight loss and healthier eating

## 2017-11-16 NOTE — Assessment & Plan Note (Signed)
Check Cr next visit; patient did not want any labs done today

## 2017-11-16 NOTE — Assessment & Plan Note (Signed)
Praise given for his hard work

## 2017-11-16 NOTE — Progress Notes (Signed)
BP 126/82 (BP Location: Right Arm, Patient Position: Sitting, Cuff Size: Normal)   Pulse 89   Temp (!) 97.5 F (36.4 C) (Oral)   Ht 5\' 9"  (1.753 m)   Wt 193 lb 14.4 oz (88 kg)   SpO2 99%   BMI 28.63 kg/m    Subjective:    Patient ID: Scott Woods, male    DOB: Sep 25, 1950, 67 y.o.   MRN: [id number]  HPI: Scott Woods is a 67 y.o. male  Chief Complaint  Patient presents with  . 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, will get to slow down a little in the coming months  Type 2 diabetes; no problems with his feet; eye exam UTD Taking metformin; last 3 urine microalbumin to creatinine reviewed and went from 77 to 59 to 31 Lab Results  Component Value Date   HGBA1C 6.8 (H) 08/16/2017   High cholesterol; limiting fatty meats; taking atorvastatin; no problems with that medicine Lab Results  Component Value Date   CHOL 151 08/16/2017   HDL 39 (L) 08/16/2017   LDLCALC 83 01/20/2017   TRIG 279 (H) 08/16/2017   CHOLHDL 3.9 08/16/2017   Hypertension; not checking BP away from the doctor; never adds salt  Prostate; seeing urologist Zara Council; "everything is wonderful"; urinating okay  Depression screen [facility] 2/9 11/16/2017 08/16/2017 01/05/2017 10/10/2016 03/28/2016  Decreased Interest 0 0 0 0 0  Down, Depressed, Hopeless 0 0 0 0 0  PHQ - 2 Score 0 0 0 0 0    Relevant past medical, surgical, family and social history reviewed Past Medical History:  Diagnosis Date  . Adrenal nodule ([place])   . Borderline diabetes   . Chronic kidney disease   . CKD (chronic kidney disease), stage II   . Diabetes mellitus without complication ([place])   . Elevated PSA   . Gross hematuria   . History of hepatitis B   . HTN  (hypertension)   . Hyperlipidemia with target LDL less than 100   . Obesity   . Prostate cancer [facility])    Past Surgical History:  Procedure Laterality Date  . ADENOIDECTOMY    . COLONOSCOPY  11/18/2014  . PROCTOSCOPY  04/2014  . TONSILLECTOMY     Family History  Problem Relation Age of Onset  . Parkinson's disease Brother   . Heart disease Mother   . Diabetes Mellitus II Mother   . Coronary artery disease Mother   . Heart disease Father   . Prostate cancer Neg Hx   . Kidney cancer Neg Hx   . Bladder Cancer Neg Hx    Social History   Tobacco Use  . Smoking status: Never Smoker  . Smokeless tobacco: Never Used  Substance Use Topics  . Alcohol use: No    Alcohol/week: 0.0 oz  . Drug use: No    Interim medical history since last visit reviewed. Allergies and medications reviewed  Review of Systems Per HPI unless specifically indicated above     Objective:    BP 126/82 (BP Location: Right Arm, Patient Position: Sitting, Cuff Size: Normal)   Pulse 89   Temp (!) 97.5 F (36.4 C) (Oral)   Ht  5\' 9"  (1.753 m)   Wt 193 lb 14.4 oz (88 kg)   SpO2 99%   BMI 28.63 kg/m   Wt Readings from Last 3 Encounters:  11/16/17 193 lb 14.4 oz (88 kg)  08/16/17 213 lb 9.6 oz (96.9 kg)  05/29/17 211 lb 11.2 oz (96 kg)    Physical Exam  Constitutional: He appears well-developed and well-nourished. No distress.  Intentional weight loss acknowledged  HENT:  Head: Normocephalic and atraumatic.  Eyes: EOM are normal. No scleral icterus.  Neck: No thyromegaly present.  Cardiovascular: Normal rate and regular rhythm.  Pulmonary/Chest: Effort normal and breath sounds normal.  Abdominal: Soft. Bowel sounds are normal. He exhibits no distension.  Musculoskeletal: He exhibits no edema.  Neurological: He is alert.  Skin: Skin is warm and Woods. No pallor.  Psychiatric: He has a normal mood and affect. His mood appears not anxious. He does not exhibit a depressed mood.   Diabetic Foot Form  - Detailed   Diabetic Foot Exam - detailed Diabetic Foot exam was performed with the following findings:  Yes 11/16/2017  1:45 PM  Visual Foot Exam completed.:  Yes  Pulse Foot Exam completed.:  Yes  Right Dorsalis Pedis:  Present Left Dorsalis Pedis:  Present  Sensory Foot Exam Completed.:  Yes Semmes-Weinstein Monofilament Test R Site 1-Great Toe:  Pos L Site 1-Great Toe:  Pos        Results for orders placed or performed in visit on 08/16/17  Microalbumin / creatinine urine ratio  Result Value Ref Range   Creatinine, Urine 153 20 - 320 mg/dL   Microalb, Ur 4.8 mg/dL   Microalb Creat Ratio 31 (H) <30 mcg/mg creat  Lipid panel  Result Value Ref Range   Cholesterol 151 <200 mg/dL   HDL 39 (L) >40 mg/dL   Triglycerides 279 (H) <150 mg/dL   LDL Cholesterol (Calc) 75 mg/dL (calc)   Total CHOL/HDL Ratio 3.9 <5.0 (calc)   Non-HDL Cholesterol (Calc) 112 <130 mg/dL (calc)  Hemoglobin A1c  Result Value Ref Range   Hgb A1c MFr Bld 6.8 (H) <5.7 % of total Hgb   Mean Plasma Glucose 148 (calc)   eAG (mmol/L) 8.2 (calc)  COMPLETE METABOLIC PANEL WITH GFR  Result Value Ref Range   Glucose, Bld 203 (H) 65 - 99 mg/dL   BUN 26 (H) 7 - 25 mg/dL   Creat 1.11 0.70 - 1.25 mg/dL   GFR, Est Non African American 69 > OR = 60 mL/min/1.66m2   GFR, Est African American 80 > OR = 60 mL/min/1.42m2   BUN/Creatinine Ratio 23 (H) 6 - 22 (calc)   Sodium 137 135 - 146 mmol/L   Potassium 4.4 3.5 - 5.3 mmol/L   Chloride 105 98 - 110 mmol/L   CO2 24 20 - 32 mmol/L   Calcium 8.7 8.6 - 10.3 mg/dL   Total Protein 6.7 6.1 - 8.1 g/dL   Albumin 3.8 3.6 - 5.1 g/dL   Globulin 2.9 1.9 - 3.7 g/dL (calc)   AG Ratio 1.3 1.0 - 2.5 (calc)   Total Bilirubin 0.5 0.2 - 1.2 mg/dL   Alkaline phosphatase (APISO) 102 40 - 115 U/L   AST 13 10 - 35 U/L   ALT 14 9 - 46 U/L  CBC with Differential/Platelet  Result Value Ref Range   WBC 9.0 3.8 - 10.8 Thousand/uL   RBC 4.33 4.20 - 5.80 Million/uL   Hemoglobin 13.4 13.2 -  17.1 g/dL   HCT 38.4 (L) 38.5 -  50.0 %   MCV 88.7 80.0 - 100.0 fL   MCH 30.9 27.0 - 33.0 pg   MCHC 34.9 32.0 - 36.0 g/dL   RDW 12.3 11.0 - 15.0 %   Platelets 215 140 - 400 Thousand/uL   MPV 10.0 7.5 - 12.5 fL   Neutro Abs 6,525 1,500 - 7,800 cells/uL   Lymphs Abs 1,746 850 - 3,900 cells/uL   WBC mixed population 504 200 - 950 cells/uL   Eosinophils Absolute 198 15 - 500 cells/uL   Basophils Absolute 27 0 - 200 cells/uL   Neutrophils Relative % 72.5 %   Total Lymphocyte 19.4 %   Monocytes Relative 5.6 %   Eosinophils Relative 2.2 %   Basophils Relative 0.3 %  TSH  Result Value Ref Range   TSH 2.09 0.40 - 4.50 mIU/L      Assessment & Plan:   Problem List Items Addressed This Visit      Cardiovascular and Mediastinum   Hypertension, goal below 140/90    Well-controlled; praise given for weight loss        Endocrine   Type 2 diabetes mellitus with microalbuminuria, without long-term current use of insulin (HCC)    Foot exam; eye exam recommended and I showed him pictures of diabetic eye changes; check A1c every 6 months if A1c less than 7; patient did not want labs today anyway; reviewed his last 3 urine microalbumin:Creatinine      Relevant Orders   Ambulatory referral to Ophthalmology     Genitourinary   Chronic kidney disease (CKD), stage II (mild)    Check Cr next visit; patient did not want any labs done today        Other   Overweight (BMI 25.0-29.9)    Praise given for his hard work      Hyperlipidemia LDL goal <100    Check lipids at next visit (patient did not want labs today); praise given for weight loss and healthier eating          Follow up plan: Return in about 3 months (around 02/15/2018) for twenty minute follow-up with fasting labs.  An after-visit summary was printed and given to the patient at [facility].  Please see the patient instructions which may contain other information and recommendations beyond what is mentioned above in the  assessment and plan.  No orders of the defined types were placed in this encounter.   Orders Placed This Encounter  Procedures  . Ambulatory referral to Ophthalmology

## 2017-11-16 NOTE — Assessment & Plan Note (Signed)
Well-controlled; praise given for weight loss

## 2017-11-16 NOTE — Patient Instructions (Addendum)
Please do see your eye doctor regularly, and have your eyes examined every year (or more often per his or her recommendation) Check your feet every night and let me know right away of any sores, infections, numbness, etc. Try to limit sweets, white bread, white rice, white potatoes It is okay with me for you to not check your fingerstick blood sugars (per [employer] of Endocrinology Best Practices), unless you are interested and feel it would be helpful for you We'll get labs the next time so please come fasting

## 2017-11-27 ENCOUNTER — Other Ambulatory Visit: Payer: Self-pay

## 2017-11-27 ENCOUNTER — Other Ambulatory Visit: Payer: 59

## 2017-11-27 DIAGNOSIS — R972 Elevated prostate specific antigen [PSA]: Secondary | ICD-10-CM

## 2017-11-28 LAB — PSA: Prostate Specific Ag, Serum: 5.7 ng/mL — ABNORMAL HIGH (ref 0.0–4.0)

## 2017-11-29 NOTE — Progress Notes (Signed)
11/30/2017 4:45 PM   Scott Woods 05-01-1951 [id number]  Referring provider: Arnetha Courser, MD [address] [place], [place] [address]  Chief Complaint  Patient presents with  . Benign Prostatic Hypertrophy  . Elevated PSA  . Hematuria    HPI: 67 yo WM with a history of hematuria, elevated PSA and BPH with LUTS who presents today for 6 month follow-up.  Elevated PSA 67 yo WM who is referred by Dr. Sanda Klein for an elevated PSA.  PSA was found to be 8.6 ng/mL on 10/10/2016 during a routine screening.  Patient was seen by Carlyle Lipa, NP in 2015 for a PSA of 9.5.  He did undergo a biopsy at that time and the biopsy was negative.  His repeated PSA returned at 8.7 six months later.  He was to be followed on a biannual basis, but he did not return for follow up.  Repeated PSA in 02/18 was 9.2.  Now it is 5.7. (11/2017)  His IPSS score today is 2, which is mild lower urinary tract symptomatology. He is delighted with his quality life due to his urinary symptoms.  His previous I PSS score was 4/1.  His previous PVR was 53 mL.  His has no major complaints at this time.  He denies any dysuria, hematuria or suprapubic pain.   He also denies any recent fevers, chills, nausea or vomiting.   He does not have a family history of PCa.  IPSS    Row Name 11/30/17 1600         International Prostate Symptom Score   How often have you had the sensation of not emptying your bladder?  Less than 1 in 5     How often have you had to urinate less than every two hours?  Not at All     How often have you found you stopped and started again several times when you urinated?  Not at All     How often have you found it difficult to postpone urination?  Not at All     How often have you had a weak urinary stream?  Not at All     How often have you had to strain to start urination?  Less than 1 in 5 times     How many times did you typically get up at night to urinate?  None     Total IPSS  Score  2       Quality of Life due to urinary symptoms   If you were to spend the rest of your life with your urinary condition just the way it is now how would you feel about that?  Delighted        Score:  1-7 Mild 8-19 Moderate 20-35 Severe  History of hematuria He also had a hematuria workup for gross hematuria in 2015 with CT Urogram demonstrating a 1.2 cm left adrenal nodule.  Cystoscopy was negative.  Repeated hematuria workup with CTU and cystoscopy noted enlarged prostate Visually obstructive, 6 cm in length, and hypervascular and Mild trabeculation. One small diverticulum noted in posterior wall of bladder in 11/24/2016.   He does not report any gross hematuria.  His UA today is positive for 0-5 WBC's and 0-2 RBC's.     PMH: Past Medical History:  Diagnosis Date  . Adrenal nodule ([place])   . Borderline diabetes   . Chronic kidney disease   . CKD (chronic kidney disease), stage II   .  Diabetes mellitus without complication ([place])   . Elevated PSA   . Gross hematuria   . History of hepatitis B   . HTN (hypertension)   . Hyperlipidemia with target LDL less than 100   . Obesity   . Prostate cancer [facility])     Surgical History: Past Surgical History:  Procedure Laterality Date  . ADENOIDECTOMY    . COLONOSCOPY  11/18/2014  . PROCTOSCOPY  04/2014  . TONSILLECTOMY      Home Medications:  Allergies as of 11/30/2017   No Known Allergies     Medication List        Accurate as of 11/30/17  4:45 PM. Always use your most recent med list.          aspirin EC 81 MG tablet Take 1 tablet (81 mg total) by mouth daily.   atorvastatin 10 MG tablet Commonly known as:  LIPITOR TAKE 1 TABLET BY MOUTH EVERYDAY AT BEDTIME   finasteride 5 MG tablet Commonly known as:  PROSCAR Take 1 tablet (5 mg total) by mouth daily.   lisinopril 2.5 MG tablet Commonly known as:  PRINIVIL,ZESTRIL TAKE 1 TABLET BY MOUTH EVERY DAY   metFORMIN 500 MG tablet Commonly known as:   GLUCOPHAGE Take 1 tablet (500 mg total) by mouth daily.       Allergies: No Known Allergies  Family History: Family History  Problem Relation Age of Onset  . Parkinson's disease Brother   . Heart disease Mother   . Diabetes Mellitus II Mother   . Coronary artery disease Mother   . Heart disease Father   . Prostate cancer Neg Hx   . Kidney cancer Neg Hx   . Bladder Cancer Neg Hx     Social History:  reports that  has never smoked. he has never used smokeless tobacco. He reports that he does not drink alcohol or use drugs.  ROS: UROLOGY Frequent Urination?: No Hard to postpone urination?: No Burning/pain with urination?: No Get up at night to urinate?: No Leakage of urine?: No Urine stream starts and stops?: No Trouble starting stream?: No Do you have to strain to urinate?: No Blood in urine?: No Urinary tract infection?: No Sexually transmitted disease?: No Injury to kidneys or bladder?: No Painful intercourse?: No Weak stream?: No Erection problems?: No Penile pain?: No  Gastrointestinal Nausea?: No Vomiting?: No Indigestion/heartburn?: No Diarrhea?: No Constipation?: No  Constitutional Fever: No Night sweats?: No Weight loss?: No Fatigue?: No  Skin Skin rash/lesions?: No Itching?: No  Eyes Blurred vision?: No Double vision?: No  Ears/Nose/Throat Sore throat?: No Sinus problems?: No  Hematologic/Lymphatic Swollen glands?: No Easy bruising?: No  Cardiovascular Leg swelling?: No Chest pain?: No  Respiratory Cough?: No Shortness of breath?: No  Endocrine Excessive thirst?: No  Musculoskeletal Back pain?: No Joint pain?: No  Neurological Headaches?: No Dizziness?: No  Psychologic Depression?: No Anxiety?: No  Physical Exam: BP 125/82 (BP Location: Right Arm, Patient Position: Sitting, Cuff Size: Normal)   Pulse 88   Ht 5\' 9"  (1.753 m)   Wt 192 lb 8 oz (87.3 kg)   BMI 28.43 kg/m   Constitutional: Well nourished. Alert  and oriented, No acute distress. HEENT: [place] AT, moist mucus membranes. Trachea midline, no masses. Cardiovascular: No clubbing, cyanosis, or edema. Respiratory: Normal respiratory effort, no increased work of breathing. GI: Abdomen is soft, non tender, non distended, no abdominal masses. Liver and spleen not palpable.  No hernias appreciated.  Stool sample for occult testing is  not indicated.   GU: No CVA tenderness.  No bladder fullness or masses.  Patient with circumcised phallus.  Urethral meatus is patent.  No penile discharge. No penile lesions or rashes. Scrotum without lesions, cysts, rashes and/or edema.  Testicles are located scrotally bilaterally. No masses are appreciated in the testicles. Left and right epididymis are normal. Rectal: Patient with  normal sphincter tone. Anus and perineum without scarring or rashes. No rectal masses are appreciated. Prostate is approximately 60 grams, no nodules are appreciated. Seminal vesicles are normal. Skin: No rashes, bruises or suspicious lesions. Lymph: No cervical or inguinal adenopathy. Neurologic: Grossly intact, no focal deficits, moving all 4 extremities. Psychiatric: Normal mood and affect.  Laboratory Data: Lab Results  Component Value Date   WBC 9.0 08/16/2017   HGB 13.4 08/16/2017   HCT 38.4 (L) 08/16/2017   MCV 88.7 08/16/2017   PLT 215 08/16/2017    Lab Results  Component Value Date   CREATININE 1.11 08/16/2017    Lab Results  Component Value Date   PSA 8.6 (H) 10/10/2016       PSA                        5.1                                                                   05/24/2017  Lab Results  Component Value Date   HGBA1C 6.8 (H) 08/16/2017       Component Value Date/Time   CHOL 151 08/16/2017 1429   HDL 39 (L) 08/16/2017 1429   CHOLHDL 3.9 08/16/2017 1429   VLDL 18 01/20/2017 1530   LDLCALC 75 08/16/2017 1429    Lab Results  Component Value Date   AST 13 08/16/2017   Lab Results  Component Value  Date   ALT 14 08/16/2017    I reviewed the labs   Assessment & Plan:    1. Elevated PSA  - PSA is stable with finasteride  - RTC in 6 months with PSA and exam  2. History of hematuria  - hematuria workup in 2015 and 2018 - no worrisome lesions discovered  - no report of gross hematuria  - UA today is negative for AMH  - RTC in one year for UA  3. BPH with LUTS  - IPSS score is 2/0, it is improving  - Continue conservative management, avoiding bladder irritants and timed voiding's  - continue finasteride 5 mg daily; refills given  - RTC in 6 months for I PSS, PSA and exam    Return in about 6 months (around 06/02/2018) for I PSS, PSA and exam.  These notes generated with voice recognition software. I apologize for typographical errors.  Zara Council, [facility] [address], [place], [place] [address] [phone]

## 2017-11-30 ENCOUNTER — Ambulatory Visit: Payer: 59 | Admitting: Urology

## 2017-11-30 ENCOUNTER — Encounter: Payer: Self-pay | Admitting: Urology

## 2017-11-30 VITALS — BP 125/82 | HR 88 | Ht 69.0 in | Wt 192.5 lb

## 2017-11-30 DIAGNOSIS — R972 Elevated prostate specific antigen [PSA]: Secondary | ICD-10-CM | POA: Diagnosis not present

## 2017-11-30 DIAGNOSIS — N401 Enlarged prostate with lower urinary tract symptoms: Secondary | ICD-10-CM | POA: Diagnosis not present

## 2017-11-30 DIAGNOSIS — Z87448 Personal history of other diseases of urinary system: Secondary | ICD-10-CM

## 2017-11-30 DIAGNOSIS — N138 Other obstructive and reflux uropathy: Secondary | ICD-10-CM | POA: Diagnosis not present

## 2017-11-30 LAB — URINALYSIS, COMPLETE
BILIRUBIN UA: NEGATIVE
GLUCOSE, UA: NEGATIVE
Ketones, UA: NEGATIVE
Leukocytes, UA: NEGATIVE
Nitrite, UA: NEGATIVE
PH UA: 6 (ref 5.0–7.5)
RBC UA: NEGATIVE
Specific Gravity, UA: >1.03 — ABNORMAL HIGH (ref 1.005–1.030)
UUROB: 1 mg/dL (ref 0.2–1.0)

## 2017-11-30 LAB — MICROSCOPIC EXAMINATION
Bacteria, UA: NONE SEEN
Epithelial Cells (non renal): NONE SEEN /hpf (ref 0–10)

## 2017-11-30 MED ORDER — FINASTERIDE 5 MG PO TABS: 5.0000 mg | ORAL_TABLET | Freq: Every day | ORAL | 3 refills | Status: DC

## 2018-01-07 ENCOUNTER — Other Ambulatory Visit: Payer: Self-pay | Admitting: Family Medicine

## 2018-02-04 ENCOUNTER — Other Ambulatory Visit: Payer: Self-pay | Admitting: Family Medicine

## 2018-02-04 DIAGNOSIS — E8881 Metabolic syndrome: Secondary | ICD-10-CM

## 2018-02-13 ENCOUNTER — Encounter: Payer: Self-pay | Admitting: Family Medicine

## 2018-02-13 ENCOUNTER — Ambulatory Visit: Payer: 59 | Admitting: Family Medicine

## 2018-02-13 VITALS — BP 116/72 | HR 98 | Temp 98.2°F | Resp 12 | Ht 69.0 in | Wt 186.8 lb

## 2018-02-13 DIAGNOSIS — R972 Elevated prostate specific antigen [PSA]: Secondary | ICD-10-CM | POA: Diagnosis not present

## 2018-02-13 DIAGNOSIS — Z5181 Encounter for therapeutic drug level monitoring: Secondary | ICD-10-CM

## 2018-02-13 DIAGNOSIS — E785 Hyperlipidemia, unspecified: Secondary | ICD-10-CM | POA: Diagnosis not present

## 2018-02-13 DIAGNOSIS — R809 Proteinuria, unspecified: Secondary | ICD-10-CM | POA: Diagnosis not present

## 2018-02-13 DIAGNOSIS — I10 Essential (primary) hypertension: Secondary | ICD-10-CM

## 2018-02-13 DIAGNOSIS — Z23 Encounter for immunization: Secondary | ICD-10-CM | POA: Diagnosis not present

## 2018-02-13 DIAGNOSIS — E1129 Type 2 diabetes mellitus with other diabetic kidney complication: Secondary | ICD-10-CM

## 2018-02-13 DIAGNOSIS — E8881 Metabolic syndrome: Secondary | ICD-10-CM | POA: Diagnosis not present

## 2018-02-13 DIAGNOSIS — E663 Overweight: Secondary | ICD-10-CM

## 2018-02-13 DIAGNOSIS — N182 Chronic kidney disease, stage 2 (mild): Secondary | ICD-10-CM

## 2018-02-13 NOTE — Assessment & Plan Note (Signed)
Encouraged ongoing weight loss

## 2018-02-13 NOTE — Patient Instructions (Addendum)
I strongly encourage you to consider getting the pneumonia vaccine If you decide that you want the pneumonia vaccine, please schedule a visit here with Roselyn Reef or another CMA to get that vaccine as soon as possible Please do make an appointment to see the eye doctor as soon as possible Keep up the great job with eating better and weight loss

## 2018-02-13 NOTE — Assessment & Plan Note (Signed)
Check renal function; avoid NSAIDs; stay  hydrated

## 2018-02-13 NOTE — Assessment & Plan Note (Signed)
Check liver and kidneys 

## 2018-02-13 NOTE — Assessment & Plan Note (Signed)
Check lipids; try to avoid bacon, sausage, bologna, cheese; continue statin

## 2018-02-13 NOTE — Assessment & Plan Note (Signed)
Will follow-up with urologist

## 2018-02-13 NOTE — Assessment & Plan Note (Signed)
Keep up the good work with weight loss

## 2018-02-13 NOTE — Assessment & Plan Note (Signed)
Urged patient to get the PCV-13 and he agrees today; explained citing risk of drug resistant organisms

## 2018-02-13 NOTE — Progress Notes (Signed)
BP 116/72   Pulse 98   Temp 98.2 F (36.8 C) (Oral)   Resp 12   Ht 5\' 9"  (1.753 m)   Wt 186 lb 12.8 oz (84.7 kg)   SpO2 99%   BMI 27.59 kg/m    Subjective:    Patient ID: Scott Woods, male    DOB: 21-Jan-1951, 67 y.o.   MRN: [id number]  HPI: Scott Woods is a 67 y.o. male  Chief Complaint  Patient presents with  . Follow-up    HPI Patient is here for follow-up  Type 2 diabetes; not checking sugars; no Woods mouth, no blurred vision; eye exam over a year ago; Dr. Thomasene Ripple Lab Results  Component Value Date   HGBA1C 6.6 (H) 02/13/2018    HTN; controlled  CKD; avoiding NSAIDs; does drink water, does drink diet powdered drinks, diet Mt Dew  [facility] today  High cholesterol; does eat a little fatty meat once in a while; no muscle aches  Metabolic syndrome  Patient talked several times about his new girlfriend; at one point, he offhandedly said that if he had a gun, he would get rid of him; he is talking about Donnie, her ex-boyfriend; he does not have a gun; he does not have any means of harming this man; he says he was just mad; he would never shoot anybody he says; if there is an event in which they are confronted, he will call 911 and not confront him personally  Hx of elevated PSA; saw urologist; everything is "fine"; goes back to f/u; urinating just fine; good stream most times  Depression screen [facility] 2/9 02/13/2018 11/16/2017 08/16/2017 01/05/2017 10/10/2016  Decreased Interest 0 0 0 0 0  Down, Depressed, Hopeless 0 0 0 0 0  PHQ - 2 Score 0 0 0 0 0    Relevant past medical, surgical, family and social history reviewed Past Medical History:  Diagnosis Date  . Adrenal nodule ([place])   . Borderline diabetes   . Chronic kidney disease   . CKD (chronic kidney disease), stage II   . Diabetes mellitus without complication ([place])   . Elevated PSA   . Gross hematuria   . History of hepatitis B   . HTN (hypertension)     . Hyperlipidemia with target LDL less than 100   . Obesity   . Prostate cancer [facility])    Past Surgical History:  Procedure Laterality Date  . ADENOIDECTOMY    . COLONOSCOPY  11/18/2014  . PROCTOSCOPY  04/2014  . TONSILLECTOMY     Family History  Problem Relation Age of Onset  . Parkinson's disease Brother   . Heart disease Mother   . Diabetes Mellitus II Mother   . Coronary artery disease Mother   . Heart disease Father   . Prostate cancer Neg Hx   . Kidney cancer Neg Hx   . Bladder Cancer Neg Hx    Social History   Tobacco Use  . Smoking status: Never Smoker  . Smokeless tobacco: Never Used  Substance Use Topics  . Alcohol use: No    Alcohol/week: 0.0 oz  . Drug use: No    Interim medical history since last visit reviewed. Allergies and medications reviewed  Review of Systems Per HPI unless specifically indicated above     Objective:    BP 116/72   Pulse 98   Temp 98.2 F (36.8 C) (Oral)   Resp 12  Ht 5\' 9"  (1.753 m)   Wt 186 lb 12.8 oz (84.7 kg)   SpO2 99%   BMI 27.59 kg/m   Wt Readings from Last 3 Encounters:  02/13/18 186 lb 12.8 oz (84.7 kg)  11/30/17 192 lb 8 oz (87.3 kg)  11/16/17 193 lb 14.4 oz (88 kg)    Physical Exam  Constitutional: He appears well-developed and well-nourished. No distress.  HENT:  Head: Normocephalic and atraumatic.  Eyes: EOM are normal. No scleral icterus.  Neck: No thyromegaly present.  Cardiovascular: Normal rate and regular rhythm.  Pulmonary/Chest: Effort normal and breath sounds normal.  Abdominal: Soft. Bowel sounds are normal. He exhibits no distension.  Musculoskeletal: He exhibits no edema.  Neurological: Coordination normal.  Skin: Skin is warm and Woods. No pallor.  Psychiatric: He has a normal mood and affect. His behavior is normal. Judgment and thought content normal.   Diabetic Foot Form - Detailed   Diabetic Foot Exam - detailed Diabetic Foot exam was performed with the following findings:  Yes  02/13/2018  3:07 PM  Visual Foot Exam completed.:  Yes  Pulse Foot Exam completed.:  Yes  Sensory Foot Exam Completed.:  Yes Semmes-Weinstein Monofilament Test         Assessment & Plan:   Problem List Items Addressed This Visit      Cardiovascular and Mediastinum   Hypertension, goal below 140/90    Well-controlled        Endocrine   Type 2 diabetes mellitus with microalbuminuria, without long-term current use of insulin (HCC) - Primary   Relevant Orders   Hemoglobin A1C (Completed)   Microalbumin / creatinine urine ratio (Completed)   Microalbuminuria due to type 2 diabetes mellitus (HCC)    Check urine today; limit red meats      Relevant Orders   Microalbumin / creatinine urine ratio (Completed)     Genitourinary   Chronic kidney disease (CKD), stage II (mild)    Check renal function; avoid NSAIDs; stay  hydrated        Other   Overweight (BMI 25.0-29.9)    Keep up the good work with weight loss      Need for vaccination with 13-polyvalent pneumococcal conjugate vaccine    Urged patient to get the PCV-13 and he agrees today; explained citing risk of drug resistant organisms      Relevant Orders   Pneumococcal conjugate vaccine 13-valent IM (Completed)   Metabolic syndrome    Encouraged ongoing weight loss      Medication monitoring encounter    Check liver and kidneys      Relevant Orders   COMPLETE METABOLIC PANEL WITH GFR (Completed)   Hyperlipidemia LDL goal <100    Check lipids; try to avoid bacon, sausage, bologna, cheese; continue statin      Relevant Orders   Lipid panel (Completed)   Abnormal prostate specific antigen    Will follow-up with urologist          Follow up plan: Return in about 3 months (around 05/16/2018).  An after-visit summary was printed and given to the patient at [facility].  Please see the patient instructions which may contain other information and recommendations beyond what is mentioned above in the assessment  and plan.  No orders of the defined types were placed in this encounter.   Orders Placed This Encounter  Procedures  . Pneumococcal conjugate vaccine 13-valent IM  . Hemoglobin A1C  . Microalbumin / creatinine urine ratio  . COMPLETE METABOLIC  PANEL WITH GFR  . Lipid panel   I spoke with patient and the office manager; I do not think patient has intent to hurt the individual he brought up; I think he was upset and does not have the means or desire to harm this person; I expressed my concern to the patient that he needs to watch what he thinks, what he posts on social media, and what he says; he verifies that this was not an actual threat

## 2018-02-13 NOTE — Assessment & Plan Note (Signed)
Check urine today; limit red meats

## 2018-02-13 NOTE — Assessment & Plan Note (Signed)
Well controlled 

## 2018-02-14 LAB — COMPLETE METABOLIC PANEL WITH GFR
AG RATIO: 1.5 (calc) (ref 1.0–2.5)
ALT: 12 U/L (ref 9–46)
AST: 15 U/L (ref 10–35)
Albumin: 4.1 g/dL (ref 3.6–5.1)
Alkaline phosphatase (APISO): 74 U/L (ref 40–115)
BUN/Creatinine Ratio: 25 (calc) — ABNORMAL HIGH (ref 6–22)
BUN: 30 mg/dL — AB (ref 7–25)
CALCIUM: 9.1 mg/dL (ref 8.6–10.3)
CO2: 23 mmol/L (ref 20–32)
CREATININE: 1.18 mg/dL (ref 0.70–1.25)
Chloride: 108 mmol/L (ref 98–110)
GFR, EST AFRICAN AMERICAN: 75 mL/min/{1.73_m2} (ref >=60)
GFR, EST NON AFRICAN AMERICAN: 64 mL/min/{1.73_m2} (ref >=60)
GLUCOSE: 114 mg/dL — AB (ref 65–99)
Globulin: 2.7 g/dL (calc) (ref 1.9–3.7)
POTASSIUM: 4.2 mmol/L (ref 3.5–5.3)
Sodium: 141 mmol/L (ref 135–146)
Total Bilirubin: 0.8 mg/dL (ref 0.2–1.2)
Total Protein: 6.8 g/dL (ref 6.1–8.1)

## 2018-02-14 LAB — HEMOGLOBIN A1C
Hgb A1c MFr Bld: 6.6 % of total Hgb — ABNORMAL HIGH (ref <5.7)
MEAN PLASMA GLUCOSE: 143 (calc)
eAG (mmol/L): 7.9 (calc)

## 2018-02-14 LAB — LIPID PANEL
CHOL/HDL RATIO: 3.2 (calc) (ref <5.0)
Cholesterol: 148 mg/dL (ref <200)
HDL: 46 mg/dL (ref >40)
LDL CHOLESTEROL (CALC): 83 mg/dL
NON-HDL CHOLESTEROL (CALC): 102 mg/dL (ref <130)
TRIGLYCERIDES: 94 mg/dL (ref <150)

## 2018-02-14 LAB — MICROALBUMIN / CREATININE URINE RATIO
Creatinine, Urine: 181 mg/dL (ref 20–320)
MICROALB UR: 5.5 mg/dL
MICROALB/CREAT RATIO: 30 ug/mg{creat} — AB (ref <30)

## 2018-04-08 ENCOUNTER — Other Ambulatory Visit: Payer: Self-pay | Admitting: Family Medicine

## 2018-04-09 NOTE — Telephone Encounter (Signed)
Last Cr and K+ reviewed; Rx approved 

## 2018-05-31 ENCOUNTER — Other Ambulatory Visit: Payer: 59

## 2018-05-31 ENCOUNTER — Other Ambulatory Visit: Payer: Self-pay | Admitting: Family Medicine

## 2018-05-31 DIAGNOSIS — R972 Elevated prostate specific antigen [PSA]: Secondary | ICD-10-CM

## 2018-06-01 LAB — PSA: Prostate Specific Ag, Serum: 4.9 ng/mL — ABNORMAL HIGH (ref 0.0–4.0)

## 2018-06-07 ENCOUNTER — Ambulatory Visit: Payer: 59 | Admitting: Urology

## 2018-06-11 ENCOUNTER — Encounter: Payer: Self-pay | Admitting: Urology

## 2018-06-11 ENCOUNTER — Ambulatory Visit (INDEPENDENT_AMBULATORY_CARE_PROVIDER_SITE_OTHER): Payer: 59 | Admitting: Urology

## 2018-06-11 VITALS — BP 124/74 | HR 125 | Ht 70.0 in | Wt 190.9 lb

## 2018-06-11 DIAGNOSIS — R972 Elevated prostate specific antigen [PSA]: Secondary | ICD-10-CM

## 2018-06-11 NOTE — Progress Notes (Signed)
06/11/2018 4:21 PM   Scott Woods 01/12/1951 [id number]  Referring provider: Arnetha Courser, MD [address] [place], [place] [address]  Chief Complaint  Patient presents with  . Follow-up    HPI: 67 yo WM with a history of hematuria, elevated PSA and BPH with LUTS who presents today for 6 month follow-up.  Elevated PSA 67 yo WM who is referred by Dr. Sanda Klein for an elevated PSA.  PSA was found to be 8.6 ng/mL on 10/10/2016 during a routine screening.  Patient was seen by Carlyle Lipa, NP in 2015 for a PSA of 9.5.  He did undergo a biopsy at that time and the biopsy was negative.  His repeated PSA returned at 8.7 six months later.  He was to be followed on a biannual basis, but he did not return for follow up.  Repeated PSA in 02/18 was 9.2.  Now it is 4.9 (9.8) 05/2018.    His IPSS score today is 3, which is mild lower urinary tract symptomatology. He is pleased  with his quality life due to his urinary symptoms.  His previous I PSS score was 2/1.  His previous PVR was 53 mL.  His has no major complaints at this time.  He denies any dysuria, hematuria or suprapubic pain.   He also denies any recent fevers, chills, nausea or vomiting.   He does not have a family history of PCa.  IPSS    Row Name 06/11/18 1500         International Prostate Symptom Score   How often have you had the sensation of not emptying your bladder?  Not at All     How often have you had to urinate less than every two hours?  Less than 1 in 5 times     How often have you found you stopped and started again several times when you urinated?  Not at All     How often have you found it difficult to postpone urination?  Not at All     How often have you had a weak urinary stream?  Not at All     How often have you had to strain to start urination?  Less than 1 in 5 times     How many times did you typically get up at night to urinate?  1 Time     Total IPSS Score  3       Quality of Life due to  urinary symptoms   If you were to spend the rest of your life with your urinary condition just the way it is now how would you feel about that?  Pleased        Score:  1-7 Mild 8-19 Moderate 20-35 Severe  History of hematuria He also had a hematuria workup for gross hematuria in 2015 with CT Urogram demonstrating a 1.2 cm left adrenal nodule.  Cystoscopy was negative.  Repeated hematuria workup with CTU and cystoscopy noted enlarged prostate Visually obstructive, 6 cm in length, and hypervascular and Mild trabeculation. One small diverticulum noted in posterior wall of bladder in 11/24/2016.   He does not report any gross hematuria.  His UA today is negative for hematuria.     PMH: Past Medical History:  Diagnosis Date  . Adrenal nodule ([place])   . Borderline diabetes   . Chronic kidney disease   . CKD (chronic kidney disease), stage II   . Diabetes mellitus without complication ([place])   .  Elevated PSA   . Gross hematuria   . History of hepatitis B   . HTN (hypertension)   . Hyperlipidemia with target LDL less than 100   . Obesity   . Prostate cancer [facility])     Surgical History: Past Surgical History:  Procedure Laterality Date  . ADENOIDECTOMY    . COLONOSCOPY  11/18/2014  . PROCTOSCOPY  04/2014  . TONSILLECTOMY      Home Medications:  Allergies as of 06/11/2018   No Known Allergies     Medication List        Accurate as of 06/11/18  4:21 PM. Always use your most recent med list.          aspirin EC 81 MG tablet Take 1 tablet (81 mg total) by mouth daily.   atorvastatin 10 MG tablet Commonly known as:  LIPITOR TAKE 1 TABLET BY MOUTH EVERYDAY AT BEDTIME   finasteride 5 MG tablet Commonly known as:  PROSCAR Take 1 tablet (5 mg total) by mouth daily.   lisinopril 2.5 MG tablet Commonly known as:  PRINIVIL,ZESTRIL TAKE 1 TABLET BY MOUTH EVERY DAY   metFORMIN 500 MG tablet Commonly known as:  GLUCOPHAGE TAKE 1 TABLET (500 MG TOTAL) BY MOUTH DAILY.        Allergies: No Known Allergies  Family History: Family History  Problem Relation Age of Onset  . Parkinson's disease Brother   . Heart disease Mother   . Diabetes Mellitus II Mother   . Coronary artery disease Mother   . Heart disease Father   . Prostate cancer Neg Hx   . Kidney cancer Neg Hx   . Bladder Cancer Neg Hx     Social History:  reports that he has never smoked. He has never used smokeless tobacco. He reports that he does not drink alcohol or use drugs.  ROS: UROLOGY Frequent Urination?: No Hard to postpone urination?: No Burning/pain with urination?: No Get up at night to urinate?: No Leakage of urine?: No Urine stream starts and stops?: No Trouble starting stream?: No Do you have to strain to urinate?: No Blood in urine?: No Urinary tract infection?: No Sexually transmitted disease?: No Injury to kidneys or bladder?: No Painful intercourse?: No Weak stream?: No Erection problems?: No Penile pain?: No  Gastrointestinal Nausea?: No Vomiting?: No Indigestion/heartburn?: No Diarrhea?: No Constipation?: No  Constitutional Fever: No Night sweats?: No Weight loss?: No Fatigue?: No  Skin Skin rash/lesions?: No Itching?: No  Eyes Blurred vision?: No Double vision?: No  Ears/Nose/Throat Sore throat?: No Sinus problems?: No  Hematologic/Lymphatic Swollen glands?: No Easy bruising?: No  Cardiovascular Leg swelling?: No Chest pain?: No  Respiratory Cough?: No Shortness of breath?: No  Endocrine Excessive thirst?: No  Musculoskeletal Back pain?: No Joint pain?: Yes  Neurological Headaches?: No Dizziness?: No  Psychologic Depression?: No Anxiety?: No  Physical Exam: BP 124/74 (BP Location: Left Arm, Patient Position: Sitting, Cuff Size: Normal)   Pulse (!) 125   Ht 5\' 10"  (1.778 m)   Wt 190 lb 14.4 oz (86.6 kg)   BMI 27.39 kg/m   Constitutional: Well nourished. Alert and oriented, No acute distress. HEENT: [place] AT, moist  mucus membranes. Trachea midline, no masses. Cardiovascular: No clubbing, cyanosis, or edema. Respiratory: Normal respiratory effort, no increased work of breathing. GI: Abdomen is soft, non tender, non distended, no abdominal masses. Liver and spleen not palpable.  No hernias appreciated.  Stool sample for occult testing is not indicated.   GU: No CVA  tenderness.  No bladder fullness or masses.  Patient with circumcised phallus.  Urethral meatus is patent.  No penile discharge. No penile lesions or rashes. Scrotum without lesions, cysts, rashes and/or edema.  Testicles are located scrotally bilaterally. No masses are appreciated in the testicles. Left and right epididymis are normal. Rectal: Patient with  normal sphincter tone. Anus and perineum without scarring or rashes. No rectal masses are appreciated. Prostate is approximately 60 grams, no nodules are appreciated. Seminal vesicles are normal. Skin: No rashes, bruises or suspicious lesions. Lymph: No cervical or inguinal adenopathy. Neurologic: Grossly intact, no focal deficits, moving all 4 extremities. Psychiatric: Normal mood and affect.  Laboratory Data: Lab Results  Component Value Date   WBC 9.0 08/16/2017   HGB 13.4 08/16/2017   HCT 38.4 (L) 08/16/2017   MCV 88.7 08/16/2017   PLT 215 08/16/2017    Lab Results  Component Value Date   CREATININE 1.18 02/13/2018    Lab Results  Component Value Date   PSA 8.6 (H) 10/10/2016       PSA                        5.1                                                                   05/24/2017  Lab Results  Component Value Date   HGBA1C 6.6 (H) 02/13/2018       Component Value Date/Time   CHOL 148 02/13/2018 1631   HDL 46 02/13/2018 1631   CHOLHDL 3.2 02/13/2018 1631   VLDL 18 01/20/2017 1530   LDLCALC 83 02/13/2018 1631    Lab Results  Component Value Date   AST 15 02/13/2018   Lab Results  Component Value Date   ALT 12 02/13/2018    I reviewed the  labs   Assessment & Plan:    1. Elevated PSA PSA is stable with finasteride RTC in 6 months with PSA and exam  2. History of hematuria  - hematuria workup in 2015 and 2018 - no worrisome lesions discovered  - no report of gross hematuria  - UA today is negative for AMH  - RTC in one year for UA  3. BPH with LUTS  - IPSS score is 3/1, it is slightly worse  - Continue conservative management, avoiding bladder irritants and timed voiding's  - continue finasteride 5 mg daily; refills given  - RTC in 6 months for I PSS, PSA and exam    Return in about 6 months (around 12/10/2018) for IPSS, PSA, UA and exam.  These notes generated with voice recognition software. I apologize for typographical errors.  Zara Council, PA-C  [facility] [address] [place], [place] [address] [phone]

## 2018-06-12 ENCOUNTER — Telehealth: Payer: Self-pay

## 2018-06-12 LAB — URINALYSIS, COMPLETE
BILIRUBIN UA: NEGATIVE
Glucose, UA: NEGATIVE
Ketones, UA: NEGATIVE
Leukocytes, UA: NEGATIVE
Nitrite, UA: NEGATIVE
PH UA: 6 (ref 5.0–7.5)
Protein, UA: NEGATIVE
RBC UA: NEGATIVE
Specific Gravity, UA: 1.025 (ref 1.005–1.030)
UUROB: 0.2 mg/dL (ref 0.2–1.0)

## 2018-06-12 LAB — MICROSCOPIC EXAMINATION
BACTERIA UA: NONE SEEN
RBC, UA: NONE SEEN /hpf (ref 0–2)

## 2018-06-12 NOTE — Telephone Encounter (Signed)
-----   Message from Nori Riis, PA-C sent at 06/12/2018  7:19 AM EDT ----- Please let Mr. Balla know that his urine did not have blood.  We will see him next year.

## 2018-06-12 NOTE — Telephone Encounter (Signed)
Called pt, no answer. LM for pt informing him of the information below.

## 2018-08-06 ENCOUNTER — Telehealth: Payer: Self-pay | Admitting: Family Medicine

## 2018-08-06 DIAGNOSIS — E8881 Metabolic syndrome: Secondary | ICD-10-CM

## 2018-08-07 NOTE — Telephone Encounter (Signed)
lvm [phone] @ 3:45 informing pt that prescription has been sent to pharmacy. Also informed him to schedule appt for the end of November or beginning of December. If Dr Sanda Klein is booked pt has the option of seeing Benjamine Mola

## 2018-08-07 NOTE — Telephone Encounter (Signed)
Last Cr reviewed; Rx approved However, patient's last visit for diabetes was late May He is due for an appt late November, or early December is okay Please call patient, schedule appt We look forward to seeing him soon

## 2018-09-12 ENCOUNTER — Telehealth: Payer: Self-pay | Admitting: Family Medicine

## 2018-09-12 NOTE — Telephone Encounter (Signed)
Please see note from November Patient needs an appointment First available for diabetes

## 2018-09-13 NOTE — Telephone Encounter (Signed)
Called [phone] lvm informing pt that a limited supply of his prescription has been called in however he is needing to schedule appt ASAP in order to receive additional refills

## 2018-09-28 ENCOUNTER — Encounter: Payer: Self-pay | Admitting: Family Medicine

## 2018-09-28 ENCOUNTER — Ambulatory Visit: Payer: 59 | Admitting: Family Medicine

## 2018-09-28 VITALS — BP 118/76 | HR 99 | Temp 98.0°F | Ht 70.0 in | Wt 206.3 lb

## 2018-09-28 DIAGNOSIS — E663 Overweight: Secondary | ICD-10-CM

## 2018-09-28 DIAGNOSIS — Z5181 Encounter for therapeutic drug level monitoring: Secondary | ICD-10-CM

## 2018-09-28 DIAGNOSIS — E1129 Type 2 diabetes mellitus with other diabetic kidney complication: Secondary | ICD-10-CM

## 2018-09-28 DIAGNOSIS — E785 Hyperlipidemia, unspecified: Secondary | ICD-10-CM | POA: Diagnosis not present

## 2018-09-28 DIAGNOSIS — I10 Essential (primary) hypertension: Secondary | ICD-10-CM | POA: Diagnosis not present

## 2018-09-28 DIAGNOSIS — R0989 Other specified symptoms and signs involving the circulatory and respiratory systems: Secondary | ICD-10-CM | POA: Diagnosis not present

## 2018-09-28 DIAGNOSIS — R635 Abnormal weight gain: Secondary | ICD-10-CM

## 2018-09-28 DIAGNOSIS — R972 Elevated prostate specific antigen [PSA]: Secondary | ICD-10-CM

## 2018-09-28 DIAGNOSIS — R809 Proteinuria, unspecified: Secondary | ICD-10-CM

## 2018-09-28 DIAGNOSIS — N182 Chronic kidney disease, stage 2 (mild): Secondary | ICD-10-CM

## 2018-09-28 NOTE — Patient Instructions (Addendum)
Check out the information at familydoctor.org entitled "Nutrition for Weight Loss: What You Need to Know about Fad Diets" Try to lose between 1-2 pounds per week by taking in fewer calories and burning off more calories You can succeed by limiting portions, limiting foods dense in calories and fat, becoming more active, and drinking 8 glasses of water a day (64 ounces) Don't skip meals, especially breakfast, as skipping meals may alter your metabolism Do not use over-the-counter weight loss pills or gimmicks that claim rapid weight loss A healthy BMI (or body mass index) is between 18.5 and 24.9 You can calculate your ideal BMI at the NIH website ClubMonetize.fr Try to limit saturated fats in your diet (bologna, hot dogs, barbeque, cheeseburgers, hamburgers, steak, bacon, sausage, cheese, etc.) and get more fresh fruits, vegetables, and whole grains  Obesity, Adult Obesity is the condition of having too much total body fat. Being overweight or obese means that your weight is greater than what is considered healthy for your body size. Obesity is determined by a measurement called BMI. BMI is an estimate of body fat and is calculated from height and weight. For adults, a BMI of 30 or higher is considered obese. Obesity can eventually lead to other health concerns and major illnesses, including:  Stroke.  Coronary artery disease (CAD).  Type 2 diabetes.  Some types of cancer, including cancers of the colon, breast, uterus, and gallbladder.  Osteoarthritis.  High blood pressure (hypertension).  High cholesterol.  Sleep apnea.  Gallbladder stones.  Infertility problems. What are the causes? The main cause of obesity is taking in (consuming) more calories than your body uses for energy. Other factors that contribute to this condition may include:  Being born with genes that make you more likely to become obese.  Having a medical  condition that causes obesity. These conditions include: ? Hypothyroidism. ? Polycystic ovarian syndrome (PCOS). ? Binge-eating disorder. ? Cushing syndrome.  Taking certain medicines, such as steroids, antidepressants, and seizure medicines.  Not being physically active (sedentary lifestyle).  Living where there are limited places to exercise safely or buy healthy foods.  Not getting enough sleep. What increases the risk? The following factors may increase your risk of this condition:  Having a family history of obesity.  Being a woman of African-American descent.  Being a man of Hispanic descent. What are the signs or symptoms? Having excessive body fat is the main symptom of this condition. How is this diagnosed? This condition may be diagnosed based on:  Your symptoms.  Your medical history.  A physical exam. Your health care provider may measure: ? Your BMI. If you are an adult with a BMI between 25 and less than 30, you are considered overweight. If you are an adult with a BMI of 30 or higher, you are considered obese. ? The distances around your hips and your waist (circumferences). These may be compared to each other to help diagnose your condition. ? Your skinfold thickness. Your health care provider may gently pinch a fold of your skin and measure it. How is this treated? Treatment for this condition often includes changing your lifestyle. Treatment may include some or all of the following:  Dietary changes. Work with your health care provider and a dietitian to set a weight-loss goal that is healthy and reasonable for you. Dietary changes may include eating: ? Smaller portions. A portion size is the amount of a particular food that is healthy for you to eat at one time. This  varies from person to person. ? Low-calorie or low-fat options. ? More whole grains, fruits, and vegetables.  Regular physical activity. This may include aerobic activity (cardio) and strength  training.  Medicine to help you lose weight. Your health care provider may prescribe medicine if you are unable to lose 1 pound a week after 6 weeks of eating more healthily and doing more physical activity.  Surgery. Surgical options may include gastric banding and gastric bypass. Surgery may be done if: ? Other treatments have not helped to improve your condition. ? You have a BMI of 40 or higher. ? You have life-threatening health problems related to obesity. Follow these instructions at home:  Eating and drinking   Follow recommendations from your health care provider about what you eat and drink. Your health care provider may advise you to: ? Limit fast foods, sweets, and processed snack foods. ? Choose low-fat options, such as low-fat milk instead of whole milk. ? Eat 5 or more servings of fruits or vegetables every day. ? Eat at home more often. This gives you more control over what you eat. ? Choose healthy foods when you eat out. ? Learn what a healthy portion size is. ? Keep low-fat snacks on hand. ? Avoid sugary drinks, such as soda, fruit juice, iced tea sweetened with sugar, and flavored milk. ? Eat a healthy breakfast.  Drink enough water to keep your urine clear or pale yellow.  Do not go without eating for long periods of time (do not fast) or follow a fad diet. Fasting and fad diets can be unhealthy and even dangerous. Physical Activity  Exercise regularly, as told by your health care provider. Ask your health care provider what types of exercise are safe for you and how often you should exercise.  Warm up and stretch before being active.  Cool down and stretch after being active.  Rest between periods of activity. Lifestyle  Limit the time that you spend in front of your TV, computer, or video game system.  Find ways to reward yourself that do not involve food.  Limit alcohol intake to no more than 1 drink a day for nonpregnant women and 2 drinks a day for  men. One drink equals 12 oz of beer, 5 oz of wine, or 1 oz of hard liquor. General instructions  Keep a weight loss journal to keep track of the food you eat and how much you exercise you get.  Take over-the-counter and prescription medicines only as told by your health care provider.  Take vitamins and supplements only as told by your health care provider.  Consider joining a support group. Your health care provider may be able to recommend a support group.  Keep all follow-up visits as told by your health care provider. This is important. Contact a health care provider if:  You are unable to meet your weight loss goal after 6 weeks of dietary and lifestyle changes. This information is not intended to replace advice given to you by your health care provider. Make sure you discuss any questions you have with your health care provider. Document Released: 10/13/2004 Document Revised: 02/08/2016 Document Reviewed: 06/24/2015 Elsevier Interactive Patient Education  2019 Elsevier Inc.  Preventing Unhealthy [employer], Adult Staying at a healthy weight is important to your overall health. When fat builds up in your body, you may become overweight or obese. Being overweight or obese increases your risk of developing certain health problems, such as heart disease, diabetes, sleeping problems,  joint problems, and some types of cancer. Unhealthy weight gain is often the result of making unhealthy food choices or not getting enough exercise. You can make changes to your lifestyle to prevent obesity and stay as healthy as possible. What nutrition changes can be made?   Eat only as much as your body needs. To do this: ? Pay attention to signs that you are hungry or full. Stop eating as soon as you feel full. ? If you feel hungry, try drinking water first before eating. Drink enough water so your urine is clear or pale yellow. ? Eat smaller portions. Pay attention to portion sizes when eating  out. ? Look at serving sizes on food labels. Most foods contain more than one serving per container. ? Eat the recommended number of calories for your gender and activity level. For most active people, a daily total of 2,000 calories is appropriate. If you are trying to lose weight or are not very active, you may need to eat fewer calories. Talk with your health care provider or a diet and nutrition specialist (dietitian) about how many calories you need each day.  Choose healthy foods, such as: ? Fruits and vegetables. At each meal, try to fill at least half of your plate with fruits and vegetables. ? Whole grains, such as whole-wheat bread, brown rice, and quinoa. ? Lean meats, such as chicken or fish. ? Other healthy proteins, such as beans, eggs, or tofu. ? Healthy fats, such as nuts, seeds, fatty fish, and olive oil. ? Low-fat or fat-free dairy products.  Check food labels, and avoid food and drinks that: ? Are high in calories. ? Have added sugar. ? Are high in sodium. ? Have saturated fats or trans fats.  Cook foods in healthier ways, such as by baking, broiling, or grilling.  Make a meal plan for the week, and shop with a grocery list to help you stay on track with your purchases. Try to avoid going to the grocery store when you are hungry.  When grocery shopping, try to shop around the outside of the store first, where the fresh foods are. Doing this helps you to avoid prepackaged foods, which can be high in sugar, salt (sodium), and fat. What lifestyle changes can be made?   Exercise for 30 or more minutes on 5 or more days each week. Exercising may include brisk walking, yard work, biking, running, swimming, and team sports like basketball and soccer. Ask your health care provider which exercises are safe for you.  Do muscle-strengthening activities, such as lifting weights or using resistance bands, on 2 or more days a week.  Do not use any products that contain nicotine or  tobacco, such as cigarettes and e-cigarettes. If you need help quitting, ask your health care provider.  Limit alcohol intake to no more than 1 drink a day for nonpregnant women and 2 drinks a day for men. One drink equals 12 oz of beer, 5 oz of wine, or 1 oz of hard liquor.  Try to get 7-9 hours of sleep each night. What other changes can be made?  Keep a food and activity journal to keep track of: ? What you ate and how many calories you had. Remember to count the calories in sauces, dressings, and side dishes. ? Whether you were active, and what exercises you did. ? Your calorie, weight, and activity goals.  Check your weight regularly. Track any changes. If you notice you have gained weight, make changes  to your diet or activity routine.  Avoid taking weight-loss medicines or supplements. Talk to your health care provider before starting any new medicine or supplement.  Talk to your health care provider before trying any new diet or exercise plan. Why are these changes important? Eating healthy, staying active, and having healthy habits can help you to prevent obesity. Those changes also:  Help you manage stress and emotions.  Help you connect with friends and family.  Improve your self-esteem.  Improve your sleep.  Prevent long-term health problems. What can happen if changes are not made? Being obese or overweight can cause you to develop joint or bone problems, which can make it hard for you to stay active or do activities you enjoy. Being obese or overweight also puts stress on your heart and lungs and can lead to health problems like diabetes, heart disease, and some cancers. Where to find more information Talk with your health care provider or a dietitian about healthy eating and healthy lifestyle choices. You may also find information from:  U.S. Department of Agriculture, MyPlate: FormerBoss.no  American Heart Association: www.heart.org  Centers for Disease  Control and Prevention: http://www.wolf.info/ Summary  Staying at a healthy weight is important to your overall health. It helps you to prevent certain diseases and health problems, such as heart disease, diabetes, joint problems, sleep disorders, and some types of cancer.  Being obese or overweight can cause you to develop joint or bone problems, which can make it hard for you to stay active or do activities you enjoy.  You can prevent unhealthy weight gain by eating a healthy diet, exercising regularly, not smoking, limiting alcohol, and getting enough sleep.  Talk with your health care provider or a dietitian for guidance about healthy eating and healthy lifestyle choices. This information is not intended to replace advice given to you by your health care provider. Make sure you discuss any questions you have with your health care provider. Document Released: 09/06/2016 Document Revised: 06/16/2017 Document Reviewed: 10/12/2016 Elsevier Interactive Patient Education  2019 [employer].

## 2018-09-28 NOTE — Assessment & Plan Note (Signed)
Check A1c and urine microalb:Cr; work on weight loss; foot exam by MD today; patient will go see the eye doctor soon after retirement

## 2018-09-28 NOTE — Assessment & Plan Note (Signed)
Check kidneys and liver 

## 2018-09-28 NOTE — Assessment & Plan Note (Signed)
Avoid NSAIDs, stay well hydrated

## 2018-09-28 NOTE — Assessment & Plan Note (Signed)
Ideally under 70 but right now under 100; check lipids today; limit saturated fats; continue statin

## 2018-09-28 NOTE — Assessment & Plan Note (Signed)
Seeing urologist.

## 2018-09-28 NOTE — Assessment & Plan Note (Signed)
Encouraged a little bit more weight loss; start activity and build up slowly and gradually

## 2018-09-28 NOTE — Assessment & Plan Note (Signed)
Excellent control; limit salt

## 2018-09-28 NOTE — Progress Notes (Signed)
BP 118/76   Pulse 99   Temp 98 F (36.7 C) (Oral)   Ht 5\' 10"  (1.778 m)   Wt 206 lb 4.8 oz (93.6 kg)   SpO2 99%   BMI 29.60 kg/m    Subjective:    Patient ID: Scott Woods, male    DOB: 12-29-1950, 68 y.o.   MRN: [id number]  HPI: Scott Woods is a 68 y.o. male  Chief Complaint  Patient presents with  . Follow-up    HPI  Here for f/u Since last visit, no medical excitement  Type 2 diabetes; not checking sugars with my blessing; some calluses on feet Wheat bread instead of white; not much rice or pasta He'll go see the eye doctor after he retires, just been too busy  High cholesterol; eats hot dogs; doesn't cook, goes to [employer] and gets some sausage biscuits, occasional cheese  Lab Results  Component Value Date   CHOL 148 02/13/2018   HDL 46 02/13/2018   LDLCALC 83 02/13/2018   TRIG 94 02/13/2018   CHOLHDL 3.2 02/13/2018   CKD; good drink more water; avoiding NSAIDs  He does not want the flu shot  Going to retire in two weeks; he is excited about this; working long days  Depression screen [facility] 2/9 09/28/2018 02/13/2018 11/16/2017 08/16/2017 01/05/2017  Decreased Interest 0 0 0 0 0  Down, Depressed, Hopeless 0 0 0 0 0  PHQ - 2 Score 0 0 0 0 0  Altered sleeping 0 - - - -  Tired, decreased energy 0 - - - -  Change in appetite 0 - - - -  Feeling bad or failure about yourself  0 - - - -  Trouble concentrating 0 - - - -  Moving slowly or fidgety/restless 0 - - - -  Suicidal thoughts 0 - - - -  PHQ-9 Score 0 - - - -  Difficult doing work/chores Not difficult at all - - - -   Fall Risk  09/28/2018 02/13/2018 11/16/2017 08/16/2017 01/05/2017  Falls in the past year? 0 No No No No  Number falls in past yr: 0 - - - -  Injury with Fall? 0 - - - -    Relevant past medical, surgical, family and social history reviewed Past Medical History:  Diagnosis Date  . Adrenal nodule ([place])   . Borderline diabetes   . Chronic kidney disease   . CKD (chronic kidney  disease), stage II   . Diabetes mellitus without complication ([place])   . Elevated PSA   . Gross hematuria   . History of hepatitis B   . HTN (hypertension)   . Hyperlipidemia with target LDL less than 100   . Obesity   . Prostate cancer [facility])    Past Surgical History:  Procedure Laterality Date  . ADENOIDECTOMY    . COLONOSCOPY  11/18/2014  . PROCTOSCOPY  04/2014  . TONSILLECTOMY     Family History  Problem Relation Age of Onset  . Parkinson's disease Brother   . Heart disease Mother   . Diabetes Mellitus II Mother   . Coronary artery disease Mother   . Heart disease Father   . Prostate cancer Neg Hx   . Kidney cancer Neg Hx   . Bladder Cancer Neg Hx    Social History   Tobacco Use  . Smoking status: Never Smoker  . Smokeless tobacco: Never Used  Substance Use Topics  . Alcohol use: No    Alcohol/week:  0.0 standard drinks  . Drug use: No     Office Visit from 09/28/2018 in [facility]  AUDIT-C Score  0      Interim medical history since last visit reviewed. Allergies and medications reviewed  Review of Systems Per HPI unless specifically indicated above     Objective:    BP 118/76   Pulse 99   Temp 98 F (36.7 C) (Oral)   Ht 5\' 10"  (1.778 m)   Wt 206 lb 4.8 oz (93.6 kg)   SpO2 99%   BMI 29.60 kg/m   Wt Readings from Last 3 Encounters:  09/28/18 206 lb 4.8 oz (93.6 kg)  06/11/18 190 lb 14.4 oz (86.6 kg)  02/13/18 186 lb 12.8 oz (84.7 kg)    Physical Exam Constitutional:      General: He is not in acute distress.    Appearance: He is well-developed.     Comments: Weight gain  HENT:     Head: Normocephalic and atraumatic.  Eyes:     General: No scleral icterus. Neck:     Thyroid: No thyromegaly.  Cardiovascular:     Rate and Rhythm: Normal rate and regular rhythm.  Pulmonary:     Effort: Pulmonary effort is normal.     Breath sounds: Normal breath sounds.  Abdominal:     General: Bowel sounds are normal. There is no  distension.     Palpations: Abdomen is soft.  Skin:    General: Skin is warm and Woods.     Coloration: Skin is not pale.  Neurological:     Mental Status: He is alert.     Coordination: Coordination normal.  Psychiatric:        Behavior: Behavior normal.        Thought Content: Thought content normal.        Judgment: Judgment normal.    Diabetic Foot Form - Detailed   Diabetic Foot Exam - detailed Diabetic Foot exam was performed with the following findings:  Yes 09/28/2018  4:50 PM  Visual Foot Exam completed.:  Yes  Pulse Foot Exam completed.:  Yes  Right Dorsalis Pedis:  Diminished, Present Left Dorsalis Pedis:  Present, Diminished  Sensory Foot Exam Completed.:  Yes Semmes-Weinstein Monofilament Test R Site 1-Great Toe:  Pos L Site 1-Great Toe:  Pos    Comments:  Few calluses; toes are cool and violaceous     Results for orders placed or performed in visit on 06/11/18  Microscopic Examination  Result Value Ref Range   WBC, UA 0-5 0 - 5 /hpf   RBC, UA None seen 0 - 2 /hpf   Epithelial Cells (non renal) 0-10 0 - 10 /hpf   Bacteria, UA None seen None seen/Few  Urinalysis, Complete  Result Value Ref Range   Specific Gravity, UA 1.025 1.005 - 1.030   pH, UA 6.0 5.0 - 7.5   Color, UA Yellow Yellow   Appearance Ur Clear Clear   Leukocytes, UA Negative Negative   Protein, UA Negative Negative/Trace   Glucose, UA Negative Negative   Ketones, UA Negative Negative   RBC, UA Negative Negative   Bilirubin, UA Negative Negative   Urobilinogen, Ur 0.2 0.2 - 1.0 mg/dL   Nitrite, UA Negative Negative   Microscopic Examination See below:       Assessment & Plan:   Problem List Items Addressed This Visit      Cardiovascular and Mediastinum   Hypertension, goal below 140/90  Excellent control; limit salt        Endocrine   Type 2 diabetes mellitus with microalbuminuria, without long-term current use of insulin (HCC) - Primary    Check A1c and urine microalb:Cr; work  on weight loss; foot exam by MD today; patient will go see the eye doctor soon after retirement      Relevant Orders   Hemoglobin A1C   US ARTERIAL SEG MULTIPLE LE (ABI, SEGMENTAL PRESSURES, PVR'S)   Microalbumin / creatinine urine ratio     Genitourinary   Chronic kidney disease (CKD), stage II (mild)    Avoid NSAIDs, stay well hydrated        Other   Overweight (BMI 25.0-29.9)    Encouraged a little bit more weight loss; start activity and build up slowly and gradually      Medication monitoring encounter    Check kidneys and liver      Relevant Orders   COMPLETE METABOLIC PANEL WITH GFR   Hyperlipidemia LDL goal <100    Ideally under 70 but right now under 100; check lipids today; limit saturated fats; continue statin      Relevant Orders   Lipid panel   Abnormal prostate specific antigen    Seeing urologist       Other Visit Diagnoses    Decreased circulation in fingers or toes       Relevant Orders   US ARTERIAL SEG MULTIPLE LE (ABI, SEGMENTAL PRESSURES, PVR'S)   Weight gain       Relevant Orders   TSH       Follow up plan: Return in about 6 months (around 03/29/2019) for follow-up visit with Dr. Sanda Klein (or just after).  An after-visit summary was printed and given to the patient at [place].  Please see the patient instructions which may contain other information and recommendations beyond what is mentioned above in the assessment and plan.  No orders of the defined types were placed in this encounter.   Orders Placed This Encounter  Procedures  . US ARTERIAL SEG MULTIPLE LE (ABI, SEGMENTAL PRESSURES, PVR'S)  . Hemoglobin A1C  . COMPLETE METABOLIC PANEL WITH GFR  . Lipid panel  . Microalbumin / creatinine urine ratio  . TSH

## 2018-10-01 ENCOUNTER — Other Ambulatory Visit: Payer: Self-pay | Admitting: Family Medicine

## 2018-10-01 DIAGNOSIS — R799 Abnormal finding of blood chemistry, unspecified: Secondary | ICD-10-CM

## 2018-10-01 DIAGNOSIS — E785 Hyperlipidemia, unspecified: Secondary | ICD-10-CM

## 2018-10-01 LAB — MICROALBUMIN / CREATININE URINE RATIO
CREATININE, URINE: 104 mg/dL (ref 20–320)
MICROALB UR: 2.7 mg/dL
Microalb Creat Ratio: 26 mcg/mg creat (ref <30)

## 2018-10-01 LAB — LIPID PANEL
Cholesterol: 151 mg/dL (ref <200)
HDL: 50 mg/dL (ref >40)
LDL Cholesterol (Calc): 78 mg/dL (calc)
NON-HDL CHOLESTEROL (CALC): 101 mg/dL (ref <130)
Total CHOL/HDL Ratio: 3 (calc) (ref <5.0)
Triglycerides: 126 mg/dL (ref <150)

## 2018-10-01 LAB — COMPLETE METABOLIC PANEL WITH GFR
AG RATIO: 1.5 (calc) (ref 1.0–2.5)
ALT: 16 U/L (ref 9–46)
AST: 13 U/L (ref 10–35)
Albumin: 4.1 g/dL (ref 3.6–5.1)
Alkaline phosphatase (APISO): 100 U/L (ref 40–115)
BILIRUBIN TOTAL: 0.4 mg/dL (ref 0.2–1.2)
BUN / CREAT RATIO: 25 (calc) — AB (ref 6–22)
BUN: 31 mg/dL — ABNORMAL HIGH (ref 7–25)
CALCIUM: 9.1 mg/dL (ref 8.6–10.3)
CHLORIDE: 107 mmol/L (ref 98–110)
CO2: 26 mmol/L (ref 20–32)
Creat: 1.22 mg/dL (ref 0.70–1.25)
GFR, EST AFRICAN AMERICAN: 71 mL/min/{1.73_m2} (ref >=60)
GFR, EST NON AFRICAN AMERICAN: 61 mL/min/{1.73_m2} (ref >=60)
GLOBULIN: 2.8 g/dL (ref 1.9–3.7)
Glucose, Bld: 128 mg/dL — ABNORMAL HIGH (ref 65–99)
POTASSIUM: 4.7 mmol/L (ref 3.5–5.3)
SODIUM: 140 mmol/L (ref 135–146)
TOTAL PROTEIN: 6.9 g/dL (ref 6.1–8.1)

## 2018-10-01 LAB — TEST AUTHORIZATION

## 2018-10-01 LAB — TSH: TSH: 2.85 mIU/L (ref 0.40–4.50)

## 2018-10-01 LAB — HEMOGLOBIN A1C
EAG (MMOL/L): 8.4 (calc)
HEMOGLOBIN A1C: 6.9 %{Hb} — AB (ref <5.7)
MEAN PLASMA GLUCOSE: 151 (calc)

## 2018-10-01 MED ORDER — ATORVASTATIN CALCIUM 20 MG PO TABS: 20.0000 mg | ORAL_TABLET | Freq: Every day | ORAL | 1 refills | Status: DC

## 2018-10-01 NOTE — Progress Notes (Signed)
Scott Woods, please let the patient know that his 3 month blood sugar average went up (got a little worse). Encourage him to lose weight and try to eat healthier. Hopefully, retirement will be a good opportunity for him to take good care of himself. Encourage him to stay well-hydrated. His BUN climbed so please ask him to do stool cards x 3 (he can pick those up or you can mail them). Please ADD ON CBC to blood in lab (I ordered it, in order review to release). His LDL is close to goal (less than 70), but he's not quite there. Let's increase statin to 20 mg and recheck lipids in 6 weeks (I ordered that). Urine microalbumin to creatinine ratio has improved -- good news. Thyroid test is normal.

## 2018-10-01 NOTE — Progress Notes (Signed)
Please get stool cards x 3 Add on CBC Increase statin Check lipids in 6 weeks

## 2018-10-25 ENCOUNTER — Other Ambulatory Visit: Payer: Self-pay | Admitting: Family Medicine

## 2018-11-04 ENCOUNTER — Other Ambulatory Visit: Payer: Self-pay | Admitting: Family Medicine

## 2018-11-04 DIAGNOSIS — E8881 Metabolic syndrome: Secondary | ICD-10-CM

## 2018-11-04 DIAGNOSIS — R799 Abnormal finding of blood chemistry, unspecified: Secondary | ICD-10-CM

## 2018-11-05 NOTE — Telephone Encounter (Signed)
Scott Woods, please see note from January labs: "Encourage him to stay well-hydrated. His BUN climbed so please ask him to do stool cards x 3 (he can pick those up or you can mail them). Please ADD ON CBC to blood in lab (I ordered it, in order review to release)."   I don't see the CBC results  Please ask him now to come in for the CBC since the blood would have been discarded by now Please make sure he gets set of stool cards x 3, diagnosis will be elevated BUN, [place] [phone], diagnostic hemoccult testing

## 2018-11-06 ENCOUNTER — Telehealth: Payer: Self-pay | Admitting: Family Medicine

## 2018-11-06 ENCOUNTER — Other Ambulatory Visit: Payer: Self-pay

## 2018-11-06 DIAGNOSIS — R635 Abnormal weight gain: Secondary | ICD-10-CM | POA: Diagnosis not present

## 2018-11-06 DIAGNOSIS — E785 Hyperlipidemia, unspecified: Secondary | ICD-10-CM | POA: Diagnosis not present

## 2018-11-06 DIAGNOSIS — R799 Abnormal finding of blood chemistry, unspecified: Secondary | ICD-10-CM

## 2018-11-06 LAB — CBC WITH DIFFERENTIAL/PLATELET
Absolute Monocytes: 555 cells/uL (ref 200–950)
Basophils Absolute: 38 cells/uL (ref 0–200)
Basophils Relative: 0.5 %
EOS PCT: 3.2 %
Eosinophils Absolute: 240 cells/uL (ref 15–500)
HEMATOCRIT: 40.1 % (ref 38.5–50.0)
Hemoglobin: 13.8 g/dL (ref 13.2–17.1)
Lymphs Abs: 1995 cells/uL (ref 850–3900)
MCH: 30.6 pg (ref 27.0–33.0)
MCHC: 34.4 g/dL (ref 32.0–36.0)
MCV: 88.9 fL (ref 80.0–100.0)
MPV: 9.9 fL (ref 7.5–12.5)
Monocytes Relative: 7.4 %
Neutro Abs: 4673 cells/uL (ref 1500–7800)
Neutrophils Relative %: 62.3 %
Platelets: 202 10*3/uL (ref 140–400)
RBC: 4.51 10*6/uL (ref 4.20–5.80)
RDW: 12.5 % (ref 11.0–15.0)
Total Lymphocyte: 26.6 %
WBC: 7.5 10*3/uL (ref 3.8–10.8)

## 2018-11-06 LAB — LIPID PANEL
Cholesterol: 139 mg/dL (ref <200)
HDL: 49 mg/dL (ref >=40)
LDL Cholesterol (Calc): 74 mg/dL (calc)
Non-HDL Cholesterol (Calc): 90 mg/dL (calc) (ref <130)
Total CHOL/HDL Ratio: 2.8 (calc) (ref <5.0)
Triglycerides: 75 mg/dL (ref <150)

## 2018-11-06 LAB — TSH: TSH: 3.16 mIU/L (ref 0.40–4.50)

## 2018-11-06 NOTE — Telephone Encounter (Signed)
Pt came in today for labs. I talked with him about the stool cards. He stated he already had some at home and would get to them when he had time.

## 2018-11-06 NOTE — Telephone Encounter (Signed)
Already done

## 2018-11-06 NOTE — Telephone Encounter (Signed)
Pt needs refill on Atorvastatin and Metformin. [facility] [address].

## 2018-11-07 ENCOUNTER — Other Ambulatory Visit: Payer: Self-pay | Admitting: Family Medicine

## 2018-11-07 MED ORDER — ATORVASTATIN CALCIUM 40 MG PO TABS: 40.0000 mg | ORAL_TABLET | Freq: Every day | ORAL | 1 refills | Status: DC

## 2018-11-07 NOTE — Progress Notes (Signed)
Scott Woods, please let the patient know that his cholesterol is a little better than last time, but we are still not quite where we want him to be. Increase atorvastatin from 20 mg to 40 mg and we'll recheck his labs at his next visit. Please make sure he is scheduled for an appointment on or just after July 10th. Other labs are normal.  Lab Results      Component                Value               Date                      HGBA1C                   6.9 (H)             09/28/2018

## 2018-11-29 ENCOUNTER — Other Ambulatory Visit: Payer: Self-pay | Admitting: Family Medicine

## 2018-12-11 ENCOUNTER — Other Ambulatory Visit: Payer: 59

## 2018-12-13 ENCOUNTER — Ambulatory Visit: Payer: 59 | Admitting: Urology

## 2019-01-07 ENCOUNTER — Other Ambulatory Visit: Payer: Self-pay

## 2019-01-07 DIAGNOSIS — R972 Elevated prostate specific antigen [PSA]: Secondary | ICD-10-CM

## 2019-01-08 ENCOUNTER — Other Ambulatory Visit: Payer: Medicare Other

## 2019-01-08 ENCOUNTER — Other Ambulatory Visit: Payer: Self-pay

## 2019-01-08 DIAGNOSIS — R972 Elevated prostate specific antigen [PSA]: Secondary | ICD-10-CM

## 2019-01-09 LAB — PSA: Prostate Specific Ag, Serum: 5 ng/mL — ABNORMAL HIGH (ref 0.0–4.0)

## 2019-01-10 ENCOUNTER — Encounter: Payer: Self-pay | Admitting: Family Medicine

## 2019-01-13 NOTE — Progress Notes (Signed)
01/14/2019 10:32 AM   Scott Woods [birth year] [id number]  Referring provider: Arnetha Courser, MD [address] [place], [place] [address]  Chief Complaint  Patient presents with  . Follow-up    HPI: 68 yo WM with an elevated PSA, history of hematuria and BPH with LUTS who presents today for 6 month follow-up.  Elevated PSA 68 yo WM who is referred by Dr. Sanda Woods for an elevated PSA.  PSA was found to be 8.6 ng/mL on 10/10/2016 during a routine screening.  Patient was seen by Scott Lipa, NP in 2015 for a PSA of 9.5.  He did undergo a biopsy at that time and the biopsy was negative.  He was started on finasteride 5 mg daily in 10/2016.    PSA Trend  9.5 in 2015 - bx negative  8.6 on 10/10/16  9.2 on 11/02/2016 - started on finasteride   5.1 (10.2) on 05/24/2017  5.7 (11.4) on 11/27/2017  4.9 (9.8) on 05/31/2018  5.0 (10.0) on 01/08/2019   BPH with LUTS His IPSS score today is 4, which is mild lower urinary tract symptomatology.   He is pleased with his quality life due to his urinary symptoms.  His previous I PSS score was 3/1.  His previous PVR was 53 mL.  He is complaining of nocturia x 2 occasionally at today's visit.  Patient denies any gross hematuria, dysuria or suprapubic/flank pain.  Patient denies any fevers, chills, nausea or vomiting.   IPSS    Row Name 01/14/19 1000         International Prostate Symptom Score   How often have you had the sensation of not emptying your bladder?  Not at All     How often have you had to urinate less than every two hours?  Less than 1 in 5 times     How often have you found you stopped and started again several times when you urinated?  Not at All     How often have you found it difficult to postpone urination?  Not at All     How often have you had a weak urinary stream?  Not at All     How often have you had to strain to start urination?  Less than 1 in 5 times     How many times did you typically get up at  night to urinate?  2 Times     Total IPSS Score  4       Quality of Life due to urinary symptoms   If you were to spend the rest of your life with your urinary condition just the way it is now how would you feel about that?  Pleased        Score:  1-7 Mild 8-19 Moderate 20-35 Severe  History of hematuria He also had a hematuria workup for gross hematuria in 2015 with CT Urogram demonstrating a 1.2 cm left adrenal nodule.  Cystoscopy was negative.  Repeated hematuria workup with CTU and cystoscopy noted enlarged prostate Visually obstructive, 6 cm in length, and hypervascular and Mild trabeculation. One small diverticulum noted in posterior wall of bladder in 11/24/2016.   Today's UA is negative.  No reports of gross hematuria.    Left adrenal adenoma 1.2 cm adrenal nodule seen on CTU in 04/2014 and seen again on CTU in 11/2016.  He has not seen an endocrinologist.     PMH: Past Medical History:  Diagnosis Date  .  Adrenal nodule ([place])   . Borderline diabetes   . Chronic kidney disease   . CKD (chronic kidney disease), stage II   . Diabetes mellitus without complication ([facility])   . Elevated PSA   . Gross hematuria   . History of hepatitis B   . HTN (hypertension)   . Hyperlipidemia with target LDL less than 100   . Obesity   . Prostate cancer [facility])     Surgical History: Past Surgical History:  Procedure Laterality Date  . ADENOIDECTOMY    . COLONOSCOPY  11/18/2014  . PROCTOSCOPY  04/2014  . TONSILLECTOMY      Home Medications:  Allergies as of 01/14/2019   No Known Allergies     Medication List       Accurate as of January 14, 2019 10:32 AM. Always use your most recent med list.        aspirin EC 81 MG tablet Take 1 tablet (81 mg total) by mouth daily.   atorvastatin 40 MG tablet Commonly known as:  LIPITOR TAKE 1 TABLET BY MOUTH EVERYDAY AT BEDTIME   finasteride 5 MG tablet Commonly known as:  Proscar Take 1 tablet (5 mg total) by mouth daily.    lisinopril 2.5 MG tablet Commonly known as:  ZESTRIL TAKE 1 TABLET BY MOUTH EVERY DAY   metFORMIN 500 MG tablet Commonly known as:  GLUCOPHAGE Take 1 tablet (500 mg total) by mouth daily.       Allergies: No Known Allergies  Family History: Family History  Problem Relation Age of Onset  . Parkinson's disease Brother   . Heart disease Mother   . Diabetes Mellitus II Mother   . Coronary artery disease Mother   . Heart disease Father   . Prostate cancer Neg Hx   . Kidney cancer Neg Hx   . Bladder Cancer Neg Hx     Social History:  reports that he has never smoked. He has never used smokeless tobacco. He reports that he does not drink alcohol or use drugs.  ROS: UROLOGY Frequent Urination?: No Hard to postpone urination?: No Burning/pain with urination?: No Get up at night to urinate?: Yes Leakage of urine?: No Urine stream starts and stops?: No Trouble starting stream?: No Do you have to strain to urinate?: No Blood in urine?: No Urinary tract infection?: No Sexually transmitted disease?: No Injury to kidneys or bladder?: No Painful intercourse?: No Weak stream?: No Erection problems?: No Penile pain?: No  Gastrointestinal Nausea?: No Vomiting?: No Indigestion/heartburn?: No Diarrhea?: No Constipation?: No  Constitutional Fever: No Night sweats?: No Weight loss?: No Fatigue?: No  Skin Skin rash/lesions?: No Itching?: No  Eyes Blurred vision?: No Double vision?: No  Ears/Nose/Throat Sore throat?: No Sinus problems?: No  Hematologic/Lymphatic Swollen glands?: No Easy bruising?: No  Cardiovascular Leg swelling?: No Chest pain?: No  Respiratory Cough?: Yes Shortness of breath?: No  Endocrine Excessive thirst?: No  Musculoskeletal Back pain?: No Joint pain?: No  Neurological Headaches?: No Dizziness?: No  Psychologic Depression?: No Anxiety?: No  Physical Exam: BP (!) 150/79 (BP Location: Left Arm, Patient Position: Sitting,  Cuff Size: Normal)   Pulse (!) 114   Ht 5\' 10"  (1.778 m)   Wt 213 lb 3.2 oz (96.7 kg)   BMI 30.59 kg/m   Constitutional:  Well nourished. Alert and oriented, No acute distress. HEENT: [place] AT, moist mucus membranes.  Trachea midline, no masses. Cardiovascular: No clubbing, cyanosis, or edema. Respiratory: Normal respiratory effort, no increased work of  breathing. GI: Abdomen is soft, non tender, non distended, no abdominal masses. Liver and spleen not palpable.  No hernias appreciated.  Stool sample for occult testing is not indicated.   GU: No CVA tenderness.  No bladder fullness or masses.  Patient with uncircumcised phallus.  Foreskin easily retracted Urethral meatus is patent.  No penile discharge. No penile lesions or rashes. Scaling plaques on inguinal folds with areas of clearing.   A 10 mm x 6 mm mole is seen in the left inguinal fold with assymetrical borders, mixture of brown and tan in color, with a smooth surface and new finding to patient.   Scrotum without lesions, cysts, rashes and/or edema.  Testicles are located scrotally bilaterally. No masses are appreciated in the testicles. Left and right epididymis are normal. Rectal: Patient with  normal sphincter tone. Anus and perineum without scarring or rashes. No rectal masses are appreciated. Prostate is approximately 50 grams, no nodules are appreciated. Seminal vesicles are normal. Skin: No rashes, bruises or suspicious lesions. Lymph: No cervical or inguinal adenopathy. Neurologic: Grossly intact, no focal deficits, moving all 4 extremities. Psychiatric: Normal mood and affect.  Laboratory Data: Urinalysis Negative.  See Epic.   Lab Results  Component Value Date   WBC 7.5 11/06/2018   HGB 13.8 11/06/2018   HCT 40.1 11/06/2018   MCV 88.9 11/06/2018   PLT 202 11/06/2018    Lab Results  Component Value Date   CREATININE 1.22 09/28/2018    Lab Results  Component Value Date   HGBA1C 6.9 (H) 09/28/2018        Component Value Date/Time   CHOL 139 11/06/2018 0928   HDL 49 11/06/2018 0928   CHOLHDL 2.8 11/06/2018 0928   VLDL 18 01/20/2017 1530   LDLCALC 74 11/06/2018 0928    Lab Results  Component Value Date   AST 13 09/28/2018   Lab Results  Component Value Date   ALT 16 09/28/2018    I reviewed the labs   Assessment & Plan:    1. Elevated PSA PSA is stable with finasteride although it is abnormal for his age group, we discussed the possibility of a prostate cancer being present and I recommended a prostate MRI.  He deferred and understands the risk of an undiagnosed prostate cancer.   RTC in 6 months with PSA and exam  2. History of hematuria Hematuria workup in 2015 and 2018 - no worrisome lesions discovered No report of gross hematuria UA today is negative for [facility] RTC in one year for UA  3. BPH with LUTS IPSS score is 4/1, it is slightly worse Continue conservative management, avoiding bladder irritants and timed voiding's Nocturia x 2 occasionally  Continue finasteride 5 mg daily; refills given RTC in 6 months for I PSS, PSA and exam  4. Adrenal adenoma Explained that adrenal adenomas are sometime functioning and that cannot be appreciated on imaging studies and offered referral to endocronology - patient deferred understanding that a functioning adenoma may be present which can cause endocrine issues and the gland may need to be removed  Explained that adrenal adenomas can increase in size and/or become cancerous over time and it is recommended to undergo imaging periodically for surveillance - he defers at this time understanding the risk of missing a cancer  5. Atypical mole Seen in the left groin area and new to patient Referred to dermatology to rule out melanoma  Return in about 6 months (around 07/16/2019) for IPSS, PSA, UA and exam.  These notes generated with voice recognition software. I apologize for typographical errors.  Zara Council, PA-C   [facility] [address] [place], [place] [address] [phone]

## 2019-01-14 ENCOUNTER — Encounter: Payer: Self-pay | Admitting: Urology

## 2019-01-14 ENCOUNTER — Other Ambulatory Visit: Payer: Self-pay

## 2019-01-14 ENCOUNTER — Ambulatory Visit (INDEPENDENT_AMBULATORY_CARE_PROVIDER_SITE_OTHER): Payer: Medicare Other | Admitting: Urology

## 2019-01-14 VITALS — BP 150/79 | HR 114 | Ht 70.0 in | Wt 213.2 lb

## 2019-01-14 DIAGNOSIS — N401 Enlarged prostate with lower urinary tract symptoms: Secondary | ICD-10-CM | POA: Diagnosis not present

## 2019-01-14 DIAGNOSIS — D229 Melanocytic nevi, unspecified: Secondary | ICD-10-CM

## 2019-01-14 DIAGNOSIS — Z87448 Personal history of other diseases of urinary system: Secondary | ICD-10-CM | POA: Diagnosis not present

## 2019-01-14 DIAGNOSIS — R972 Elevated prostate specific antigen [PSA]: Secondary | ICD-10-CM | POA: Diagnosis not present

## 2019-01-14 DIAGNOSIS — D3502 Benign neoplasm of left adrenal gland: Secondary | ICD-10-CM | POA: Diagnosis not present

## 2019-01-14 LAB — MICROSCOPIC EXAMINATION
Bacteria, UA: NONE SEEN
RBC, Urine: NONE SEEN /hpf (ref 0–2)

## 2019-01-14 LAB — URINALYSIS, COMPLETE
Bilirubin, UA: NEGATIVE
Glucose, UA: NEGATIVE
Ketones, UA: NEGATIVE
Leukocytes,UA: NEGATIVE
Nitrite, UA: NEGATIVE
Protein,UA: NEGATIVE
RBC, UA: NEGATIVE
Specific Gravity, UA: 1.025 (ref 1.005–1.030)
Urobilinogen, Ur: 0.2 mg/dL (ref 0.2–1.0)
pH, UA: 6 (ref 5.0–7.5)

## 2019-01-14 MED ORDER — FINASTERIDE 5 MG PO TABS: 5.0000 mg | ORAL_TABLET | Freq: Every day | ORAL | 3 refills | Status: DC

## 2019-01-15 ENCOUNTER — Ambulatory Visit: Payer: Self-pay | Admitting: Urology

## 2019-01-29 ENCOUNTER — Other Ambulatory Visit: Payer: Self-pay | Admitting: Family Medicine

## 2019-01-29 DIAGNOSIS — E8881 Metabolic syndrome: Secondary | ICD-10-CM

## 2019-03-25 ENCOUNTER — Telehealth: Payer: Self-pay | Admitting: Urology

## 2019-03-25 NOTE — Telephone Encounter (Signed)
Would you check on my referral to dermatology for Mr. Scott Woods?  If he has been seen, I would like the notes.

## 2019-03-29 ENCOUNTER — Ambulatory Visit: Payer: 59 | Admitting: Family Medicine

## 2019-04-01 ENCOUNTER — Ambulatory Visit (INDEPENDENT_AMBULATORY_CARE_PROVIDER_SITE_OTHER): Payer: Medicare Other | Admitting: Nurse Practitioner

## 2019-04-01 ENCOUNTER — Other Ambulatory Visit: Payer: Self-pay

## 2019-04-01 ENCOUNTER — Encounter: Payer: Self-pay | Admitting: Nurse Practitioner

## 2019-04-01 VITALS — BP 130/76 | HR 99 | Temp 97.3°F | Resp 14 | Ht 70.0 in | Wt 217.9 lb

## 2019-04-01 DIAGNOSIS — N182 Chronic kidney disease, stage 2 (mild): Secondary | ICD-10-CM

## 2019-04-01 DIAGNOSIS — N401 Enlarged prostate with lower urinary tract symptoms: Secondary | ICD-10-CM

## 2019-04-01 DIAGNOSIS — E1129 Type 2 diabetes mellitus with other diabetic kidney complication: Secondary | ICD-10-CM

## 2019-04-01 DIAGNOSIS — I129 Hypertensive chronic kidney disease with stage 1 through stage 4 chronic kidney disease, or unspecified chronic kidney disease: Secondary | ICD-10-CM | POA: Diagnosis not present

## 2019-04-01 DIAGNOSIS — I10 Essential (primary) hypertension: Secondary | ICD-10-CM | POA: Diagnosis not present

## 2019-04-01 DIAGNOSIS — Z23 Encounter for immunization: Secondary | ICD-10-CM

## 2019-04-01 DIAGNOSIS — E785 Hyperlipidemia, unspecified: Secondary | ICD-10-CM | POA: Diagnosis not present

## 2019-04-01 DIAGNOSIS — R3911 Hesitancy of micturition: Secondary | ICD-10-CM

## 2019-04-01 DIAGNOSIS — Z5181 Encounter for therapeutic drug level monitoring: Secondary | ICD-10-CM | POA: Diagnosis not present

## 2019-04-01 DIAGNOSIS — R809 Proteinuria, unspecified: Secondary | ICD-10-CM | POA: Diagnosis not present

## 2019-04-01 DIAGNOSIS — R972 Elevated prostate specific antigen [PSA]: Secondary | ICD-10-CM | POA: Diagnosis not present

## 2019-04-01 DIAGNOSIS — Z1159 Encounter for screening for other viral diseases: Secondary | ICD-10-CM | POA: Diagnosis not present

## 2019-04-01 DIAGNOSIS — N138 Other obstructive and reflux uropathy: Secondary | ICD-10-CM | POA: Insufficient documentation

## 2019-04-01 MED ORDER — METFORMIN HCL 500 MG PO TABS: 500.0000 mg | ORAL_TABLET | Freq: Every day | ORAL | 1 refills | Status: DC

## 2019-04-01 MED ORDER — LISINOPRIL 2.5 MG PO TABS: 2.5000 mg | ORAL_TABLET | Freq: Every day | ORAL | 1 refills | Status: DC

## 2019-04-01 MED ORDER — ATORVASTATIN CALCIUM 40 MG PO TABS: ORAL_TABLET | ORAL | 1 refills | Status: DC

## 2019-04-01 NOTE — Patient Instructions (Signed)
If you have not heard anything from my staff in a week about any orders/referrals/studies from today, please contact us here to follow-up [phone]  Foods and drinks to limit include  . fried foods and other foods high in saturated fat and trans fat  . foods high in salt, also called sodium  . sweets, such as baked goods, candy, and ice cream  . beverages with added sugars, such as juice, regular soda, and regular sports or energy drinks  Drink water instead of sweetened beverages. Consider using a sugar substitute in your coffee or tea.   Instead, eat carbohydrates from fruit, vegetables, whole grains, beans, and low-fat or nonfat milk. Choose healthy carbohydrates, such as fruit, vegetables, whole grains, beans, and low-fat milk, as part of your diabetes meal plan.

## 2019-04-01 NOTE — Progress Notes (Signed)
Name: Scott Woods   MRN: [id number]    DOB: [birth year]   Date:04/01/2019       Progress Note  Subjective  Chief Complaint  Chief Complaint  Patient presents with  . Follow-up    HPI  Diabetes Mellitus Patient is rx metformin. Takes medications as prescribed with no missed doses a month.  Diet: 2- 3 meals a day, usually egg biscuit, dinner is pasta, fish, chicken. Eats vegetables daily 1-2 servings.  Drinks water mostly. Plans to set up annual eye exam. Denies blurry vision or vision changes Takes Statin and ACEi.  Checks feet routinely.  Does not check sugars.  Denies polyphagia, polydipsia, polyuria.  Lab Results  Component Value Date   HGBA1C 6.9 (H) 09/28/2018   Hypertension Patient is on lisinopril 2.5mg  daily.  Takes medications as prescribed with no missed doses a month.  No compliant with low-salt diet.  Denies chest pain, headaches, blurry vision.   Hyperlipidemia Patient rx atorvastatin 40mg  nightly. Takes medications as prescribed with no missed doses a month.  Denies myalgias Lab Results  Component Value Date   CHOL 139 11/06/2018   HDL 49 11/06/2018   LDLCALC 74 11/06/2018   TRIG 75 11/06/2018   CHOLHDL 2.8 11/06/2018    BPH and elevated PSA  Follows up with urology- Ernestine Conrad for monitoring of elevated PSA levels and takes finasteride for BPH  PHQ2/9: Depression screen [facility] 2/9 04/01/2019 09/28/2018 02/13/2018 11/16/2017 08/16/2017  Decreased Interest 0 0 0 0 0  Down, Depressed, Hopeless 0 0 0 0 0  PHQ - 2 Score 0 0 0 0 0  Altered sleeping 0 0 - - -  Tired, decreased energy 0 0 - - -  Change in appetite 0 0 - - -  Feeling bad or failure about yourself  0 0 - - -  Trouble concentrating 0 0 - - -  Moving slowly or fidgety/restless 0 0 - - -  Suicidal thoughts 0 0 - - -  PHQ-9 Score 0 0 - - -  Difficult doing work/chores Not difficult at all Not difficult at all - - -     PHQ reviewed. Negative  Patient Active Problem List   Diagnosis Date  Noted  . Type 2 diabetes mellitus with microalbuminuria, without long-term current use of insulin ([place]) 11/16/2017  . Microalbuminuria due to type 2 diabetes mellitus ([place]) 01/05/2017  . Medication monitoring encounter 10/10/2016  . Pre-ulcerative calluses 03/28/2016  . Metabolic syndrome 97/98/9211  . Adrenal adenoma 05/08/2015  . Chronic kidney disease (CKD), stage II (mild) 05/08/2015  . Abnormal prostate specific antigen 05/08/2015  . Blood in the urine 05/08/2015  . H/O type B viral hepatitis 05/08/2015  . Hyperlipidemia LDL goal <100 05/08/2015  . Hypertension, goal below 140/90 05/08/2015  . Overweight (BMI 25.0-29.9) 05/08/2015  . Right shoulder pain 05/08/2015    Past Medical History:  Diagnosis Date  . Adrenal nodule ([place])   . Borderline diabetes   . Chronic kidney disease   . CKD (chronic kidney disease), stage II   . Diabetes mellitus without complication ([place])   . Elevated PSA   . Gross hematuria   . History of hepatitis B   . HTN (hypertension)   . Hyperlipidemia with target LDL less than 100   . Obesity   . Prostate cancer [facility])     Past Surgical History:  Procedure Laterality Date  . ADENOIDECTOMY    . COLONOSCOPY  11/18/2014  . PROCTOSCOPY  04/2014  . TONSILLECTOMY  Social History   Tobacco Use  . Smoking status: Never Smoker  . Smokeless tobacco: Never Used  Substance Use Topics  . Alcohol use: No    Alcohol/week: 0.0 standard drinks     Current Outpatient Medications:  .  aspirin EC 81 MG tablet, Take 1 tablet (81 mg total) by mouth daily., Disp: , Rfl:  .  atorvastatin (LIPITOR) 40 MG tablet, TAKE 1 TABLET BY MOUTH EVERYDAY AT BEDTIME (Patient taking differently: Take 40 mg by mouth daily. ), Disp: 90 tablet, Rfl: 1 .  finasteride (PROSCAR) 5 MG tablet, Take 1 tablet (5 mg total) by mouth daily., Disp: 90 tablet, Rfl: 3 .  lisinopril (PRINIVIL,ZESTRIL) 2.5 MG tablet, TAKE 1 TABLET BY MOUTH EVERY DAY, Disp: 90 tablet, Rfl: 3 .  metFORMIN  (GLUCOPHAGE) 500 MG tablet, TAKE 1 TABLET BY MOUTH EVERY DAY, Disp: 90 tablet, Rfl: 0  No Known Allergies  Review of Systems  Constitutional: Negative for chills, fever and malaise/fatigue.  HENT: Negative for congestion, sinus pain and sore throat.   Eyes: Negative for blurred vision.  Respiratory: Negative for cough and shortness of breath.   Cardiovascular: Negative for chest pain, palpitations and leg swelling.  Gastrointestinal: Negative for abdominal pain, constipation, diarrhea and nausea.  Genitourinary: Positive for urgency. Negative for dysuria and frequency.  Musculoskeletal: Negative for falls and joint pain.  Skin: Negative for rash.  Neurological: Negative for dizziness and headaches.  Endo/Heme/Allergies: Negative for polydipsia.  Psychiatric/Behavioral: The patient is not nervous/anxious and does not have insomnia.      No other specific complaints in a complete review of systems (except as listed in HPI above).  Objective  Vitals:   04/01/19 0951  BP: 130/76  Pulse: 99  Resp: 14  Temp: (!) 97.3 F (36.3 C)  TempSrc: Temporal  SpO2: 99%  Weight: 217 lb 14.4 oz (98.8 kg)  Height: 5\' 10"  (1.778 m)     Body mass index is 31.27 kg/m.  Nursing Note and Vital Signs reviewed.  Physical Exam Vitals signs reviewed.  Constitutional:      Appearance: He is well-developed.  HENT:     Head: Normocephalic and atraumatic.  Neck:     Musculoskeletal: Normal range of motion and neck supple.     Vascular: No carotid bruit.  Cardiovascular:     Heart sounds: Normal heart sounds.  Pulmonary:     Effort: Pulmonary effort is normal.     Breath sounds: Normal breath sounds.  Abdominal:     General: Bowel sounds are normal.     Palpations: Abdomen is soft.     Tenderness: There is no abdominal tenderness.  Musculoskeletal: Normal range of motion.  Skin:    General: Skin is warm and dry.     Capillary Refill: Capillary refill takes less than 2 seconds.   Neurological:     Mental Status: He is alert and oriented to person, place, and time.     GCS: GCS eye subscore is 4. GCS verbal subscore is 5. GCS motor subscore is 6.     Sensory: No sensory deficit.  Psychiatric:        Speech: Speech normal.        Behavior: Behavior normal.        Thought Content: Thought content normal.        Judgment: Judgment normal.      Diabetic Foot Exam - Simple   Simple Foot Form Visual Inspection No deformities, no ulcerations, no other skin breakdown bilaterally:  Yes Sensation Testing Intact to touch and monofilament testing bilaterally: Yes Pulse Check Posterior Tibialis and Dorsalis pulse intact bilaterally: Yes Comments Cap refill in bilateral toes is about 3 seconds.       No results found for this or any previous visit (from the past 48 hour(s)).  Assessment & Plan 1. Hypertension, goal below 140/90 Stable  - COMPLETE METABOLIC PANEL WITH GFR - lisinopril (ZESTRIL) 2.5 MG tablet; Take 1 tablet (2.5 mg total) by mouth daily.  Dispense: 90 tablet; Refill: 1  2. Type 2 diabetes mellitus with microalbuminuria, without long-term current use of insulin ([facility]) - HgB A1c - lisinopril (ZESTRIL) 2.5 MG tablet; Take 1 tablet (2.5 mg total) by mouth daily.  Dispense: 90 tablet; Refill: 1 - metFORMIN (GLUCOPHAGE) 500 MG tablet; Take 1 tablet (500 mg total) by mouth daily.  Dispense: 90 tablet; Refill: 1  3. Chronic kidney disease (CKD), stage II (mild) - COMPLETE METABOLIC PANEL WITH GFR - lisinopril (ZESTRIL) 2.5 MG tablet; Take 1 tablet (2.5 mg total) by mouth daily.  Dispense: 90 tablet; Refill: 1  4. Hyperlipidemia LDL goal <100 - Lipid Profile - atorvastatin (LIPITOR) 40 MG tablet; TAKE 1 TABLET BY MOUTH EVERYDAY AT BEDTIME  Dispense: 90 tablet; Refill: 1  5. Abnormal prostate specific antigen Follows up with urology   6. Medication monitoring encounter - COMPLETE METABOLIC PANEL WITH GFR  7. Need for pneumococcal vaccination -  Pneumococcal polysaccharide vaccine 23-valent greater than or equal to 2yo subcutaneous/IM  8. Encounter for hepatitis C screening test for low risk patient - Hepatitis C Antibody  9. Benign prostatic hyperplasia with urinary hesitancy Sees urology     -Red flags and when to present for emergency care or RTC including fever >101.42F, chest pain, shortness of breath, new/worsening/un-resolving symptoms,  reviewed with patient at time of visit. Follow up and care instructions discussed and provided in AVS. -Reviewed Health Maintenance: pneumo vaccine

## 2019-04-02 ENCOUNTER — Other Ambulatory Visit: Payer: Self-pay | Admitting: Nurse Practitioner

## 2019-04-02 DIAGNOSIS — E1129 Type 2 diabetes mellitus with other diabetic kidney complication: Secondary | ICD-10-CM

## 2019-04-02 LAB — LIPID PANEL
Cholesterol: 136 mg/dL (ref <200)
HDL: 41 mg/dL (ref >=40)
LDL Cholesterol (Calc): 68 mg/dL (calc)
Non-HDL Cholesterol (Calc): 95 mg/dL (calc) (ref <130)
Total CHOL/HDL Ratio: 3.3 (calc) (ref <5.0)
Triglycerides: 200 mg/dL — ABNORMAL HIGH (ref <150)

## 2019-04-02 LAB — COMPLETE METABOLIC PANEL WITH GFR
AG Ratio: 1.6 (calc) (ref 1.0–2.5)
ALT: 15 U/L (ref 9–46)
AST: 14 U/L (ref 10–35)
Albumin: 4.2 g/dL (ref 3.6–5.1)
Alkaline phosphatase (APISO): 99 U/L (ref 35–144)
BUN: 22 mg/dL (ref 7–25)
CO2: 21 mmol/L (ref 20–32)
Calcium: 9.5 mg/dL (ref 8.6–10.3)
Chloride: 104 mmol/L (ref 98–110)
Creat: 1.02 mg/dL (ref 0.70–1.25)
GFR, Est African American: 88 mL/min/{1.73_m2} (ref >=60)
GFR, Est Non African American: 76 mL/min/{1.73_m2} (ref >=60)
Globulin: 2.7 g/dL (calc) (ref 1.9–3.7)
Glucose, Bld: 188 mg/dL — ABNORMAL HIGH (ref 65–99)
Potassium: 4.3 mmol/L (ref 3.5–5.3)
Sodium: 138 mmol/L (ref 135–146)
Total Bilirubin: 0.7 mg/dL (ref 0.2–1.2)
Total Protein: 6.9 g/dL (ref 6.1–8.1)

## 2019-04-02 LAB — HEMOGLOBIN A1C
Hgb A1c MFr Bld: 7.5 % of total Hgb — ABNORMAL HIGH (ref <5.7)
Mean Plasma Glucose: 169 (calc)
eAG (mmol/L): 9.3 (calc)

## 2019-04-02 LAB — HEPATITIS C ANTIBODY
Hepatitis C Ab: NONREACTIVE
SIGNAL TO CUT-OFF: 0.06 (ref <1.00)

## 2019-04-02 MED ORDER — METFORMIN HCL 500 MG PO TABS: ORAL_TABLET | ORAL | 0 refills | Status: DC

## 2019-04-02 NOTE — Telephone Encounter (Signed)
Called the patient and he stated that he did not need an app with them and he was fine and was not going to go.   Scott Woods

## 2019-04-02 NOTE — Telephone Encounter (Signed)
The patient said he was going to call and schedule his own appointment. The number was given to him. I will check and see if he called.   Sharyn Lull

## 2019-05-13 ENCOUNTER — Other Ambulatory Visit: Payer: Self-pay

## 2019-05-13 DIAGNOSIS — E1129 Type 2 diabetes mellitus with other diabetic kidney complication: Secondary | ICD-10-CM

## 2019-05-13 MED ORDER — METFORMIN HCL 500 MG PO TABS: ORAL_TABLET | ORAL | 0 refills | Status: DC

## 2019-06-25 ENCOUNTER — Other Ambulatory Visit: Payer: Self-pay

## 2019-06-25 DIAGNOSIS — E1129 Type 2 diabetes mellitus with other diabetic kidney complication: Secondary | ICD-10-CM

## 2019-06-26 MED ORDER — METFORMIN HCL 500 MG PO TABS: ORAL_TABLET | ORAL | 0 refills | Status: DC

## 2019-07-04 ENCOUNTER — Ambulatory Visit: Payer: Self-pay | Admitting: Nurse Practitioner

## 2019-07-11 ENCOUNTER — Other Ambulatory Visit: Payer: Self-pay

## 2019-07-11 DIAGNOSIS — R972 Elevated prostate specific antigen [PSA]: Secondary | ICD-10-CM

## 2019-07-12 ENCOUNTER — Other Ambulatory Visit: Payer: Medicare Other

## 2019-07-15 NOTE — Progress Notes (Signed)
07/16/2019 2:52 PM   Scott Woods 05-10-1951 [id number]  Referring provider: Arnetha Courser, MD [address] [place],  [place] [address]  Chief Complaint  Patient presents with  . Follow-up    HPI: 68 yo male with an elevated PSA, history of hematuria and BPH with LUTS who presents today for 6 month follow-up.  Elevated PSA PSA was found to be 8.6 ng/mL on 10/10/2016 during a routine screening.  Patient was seen by Carlyle Lipa, NP in 2015 for a PSA of 9.5.  He did undergo a biopsy at that time and the biopsy was negative.  He was started on finasteride 5 mg daily in 10/2016.    PSA Trend  9.5 in 2015 - bx negative  8.6 on 10/10/16  9.2 on 11/02/2016 - started on finasteride   5.1 (10.2) on 05/24/2017  5.7 (11.4) on 11/27/2017  4.9 (9.8) on 05/31/2018  5.0 (10.0) on 01/08/2019   Today's PSA pending   BPH with LUTS His IPSS score today is 3, which is mild lower urinary tract symptomatology.  He is pleased with his quality life due to his urinary symptoms.  His previous I PSS score was 4/1.  His previous PVR was 53 mL.  He has no complaints at this time.  Patient denies any gross hematuria, dysuria or suprapubic/flank pain.  Patient denies any fevers, chills, nausea or vomiting.  Taking finasteride 5 mg daily.  No family history of prostate cancer.   IPSS    Row Name 07/16/19 0900         International Prostate Symptom Score   How often have you had the sensation of not emptying your bladder?  Less than 1 in 5     How often have you had to urinate less than every two hours?  Not at All     How often have you found you stopped and started again several times when you urinated?  Not at All     How often have you found it difficult to postpone urination?  Not at All     How often have you had a weak urinary stream?  Not at All     How often have you had to strain to start urination?  Less than 1 in 5 times     How many times did you typically get up at  night to urinate?  1 Time     Total IPSS Score  3       Quality of Life due to urinary symptoms   If you were to spend the rest of your life with your urinary condition just the way it is now how would you feel about that?  Pleased        Score:  1-7 Mild 8-19 Moderate 20-35 Severe  History of hematuria He also had a hematuria workup for gross hematuria in 2015 with CT Urogram demonstrating a 1.2 cm left adrenal nodule.  Cystoscopy was negative.  Repeated hematuria workup with CTU and cystoscopy noted enlarged prostate Visually obstructive, 6 cm in length, and hypervascular and Mild trabeculation. One small diverticulum noted in posterior wall of bladder in 11/24/2016.   He denies any gross hematuria.  His UA today is negative for microscopic hematuria.    Left adrenal adenoma 1.2 cm adrenal nodule seen on CTU in 04/2014 and seen again on CTU in 11/2016.  He has not seen an endocrinologist.     PMH: Past Medical History:  Diagnosis  Date  . Adrenal nodule ([place])   . Blood in the urine 05/08/2015  . Borderline diabetes   . Chronic kidney disease   . CKD (chronic kidney disease), stage II   . Diabetes mellitus without complication ([place])   . Elevated PSA   . Gross hematuria   . H/O type B viral hepatitis 05/08/2015   When he was a child   . History of hepatitis B   . HTN (hypertension)   . Hyperlipidemia with target LDL less than 100   . Obesity   . Pre-ulcerative calluses 03/28/2016   great toe   . Prostate cancer [facility])     Surgical History: Past Surgical History:  Procedure Laterality Date  . ADENOIDECTOMY    . COLONOSCOPY  11/18/2014  . PROCTOSCOPY  04/2014  . TONSILLECTOMY      Home Medications:  Allergies as of 07/16/2019   No Known Allergies     Medication List       Accurate as of July 16, 2019 11:59 PM. If you have any questions, ask your nurse or doctor.        aspirin EC 81 MG tablet Take 1 tablet (81 mg total) by mouth daily.   atorvastatin 40 MG  tablet Commonly known as: LIPITOR TAKE 1 TABLET BY MOUTH EVERYDAY AT BEDTIME   finasteride 5 MG tablet Commonly known as: Proscar Take 1 tablet (5 mg total) by mouth daily.   lisinopril 2.5 MG tablet Commonly known as: ZESTRIL Take 1 tablet (2.5 mg total) by mouth daily.   metFORMIN 500 MG tablet Commonly known as: GLUCOPHAGE Take 1 tablet with breakfast and 1 tablet with dinner for 2 weeks then increase to 2 tablets with breakfast and 1 tablet with dinner daily after thereafter.       Allergies: No Known Allergies  Family History: Family History  Problem Relation Age of Onset  . Parkinson's disease Brother   . Heart disease Mother   . Diabetes Mellitus II Mother   . Coronary artery disease Mother   . Heart disease Father   . Prostate cancer Neg Hx   . Kidney cancer Neg Hx   . Bladder Cancer Neg Hx     Social History:  reports that he has never smoked. He has never used smokeless tobacco. He reports that he does not drink alcohol or use drugs.  ROS: UROLOGY Frequent Urination?: No Hard to postpone urination?: No Burning/pain with urination?: No Get up at night to urinate?: No Leakage of urine?: No Urine stream starts and stops?: No Trouble starting stream?: No Do you have to strain to urinate?: No Blood in urine?: No Urinary tract infection?: No Sexually transmitted disease?: No Injury to kidneys or bladder?: No Painful intercourse?: No Weak stream?: No Erection problems?: No Penile pain?: No  Gastrointestinal Nausea?: No Vomiting?: No Indigestion/heartburn?: No Diarrhea?: No Constipation?: No  Constitutional Fever: No Night sweats?: No Weight loss?: No Fatigue?: No  Skin Skin rash/lesions?: No Itching?: No  Eyes Blurred vision?: No Double vision?: No  Ears/Nose/Throat Sore throat?: No Sinus problems?: No  Hematologic/Lymphatic Swollen glands?: No Easy bruising?: No  Cardiovascular Leg swelling?: No Chest pain?: No  Respiratory  Cough?: No Shortness of breath?: No  Endocrine Excessive thirst?: No  Musculoskeletal Back pain?: No Joint pain?: No  Neurological Headaches?: No Dizziness?: No  Psychologic Depression?: No Anxiety?: No  Physical Exam: BP (!) 148/80   Pulse 96   Ht 5\' 10"  (1.778 m)   Wt 213 lb (96.6  kg)   BMI 30.56 kg/m   Constitutional:  Well nourished. Alert and oriented, No acute distress. HEENT: [place] AT, moist mucus membranes.  Trachea midline, no masses. Cardiovascular: No clubbing, cyanosis, or edema. Respiratory: Normal respiratory effort, no increased work of breathing. GI: Abdomen is soft, non tender, non distended, no abdominal masses. Liver and spleen not palpable.  No hernias appreciated.  Stool sample for occult testing is not indicated.   GU: No CVA tenderness.  No bladder fullness or masses.  Patient with uncircumcised phallus.  Foreskin easily retracted.  Urethral meatus is patent.  No penile discharge. No penile lesions or rashes. A 10 mm x 6 mm mole is seen in the left inguinal fold with asymmetrical borders, mixture of brown and tan in color, with a smooth surface.  Scrotum without lesions, cysts, rashes and/or edema.  Testicles are located scrotally bilaterally. No masses are appreciated in the testicles. Left and right epididymis are normal. Rectal: Patient with  normal sphincter tone. Anus and perineum without scarring or rashes. No rectal masses are appreciated. Prostate is approximately 50 grams, no nodules are appreciated. Seminal vesicles could be palpated.   Skin: No rashes, bruises or suspicious lesions. Lymph: No inguinal adenopathy. Neurologic: Grossly intact, no focal deficits, moving all 4 extremities. Psychiatric: Normal mood and affect.  Laboratory Data: Urinalysis Component     Latest Ref Rng & Units 07/16/2019  Specific Gravity, UA     1.005 - 1.030 >1.030 (H)  pH, UA     5.0 - 7.5 5.5  Color, UA     Yellow Yellow  Appearance Ur     Clear Clear   Leukocytes,UA     Negative Negative  Protein,UA     Negative/Trace Trace (A)  Glucose, UA     Negative Negative  Ketones, UA     Negative Negative  RBC, UA     Negative Negative  Bilirubin, UA     Negative Negative  Urobilinogen, Ur     0.2 - 1.0 mg/dL 0.2  Nitrite, UA     Negative Negative  Microscopic Examination      See below:   Component     Latest Ref Rng & Units 07/16/2019  WBC, UA     0 - 5 /hpf 0-5  RBC     0 - 2 /hpf None seen  Epithelial Cells (non renal)     0 - 10 /hpf 0-10  Casts     None seen /lpf Present (A)  Cast Type     N/A Hyaline casts  Mucus, UA     Not Estab. Present (A)  Bacteria, UA     None seen/Few None seen    Lab Results  Component Value Date   WBC 7.5 11/06/2018   HGB 13.8 11/06/2018   HCT 40.1 11/06/2018   MCV 88.9 11/06/2018   PLT 202 11/06/2018    Lab Results  Component Value Date   CREATININE 1.02 04/01/2019    Lab Results  Component Value Date   HGBA1C 7.5 (H) 04/01/2019       Component Value Date/Time   CHOL 136 04/01/2019 1050   HDL 41 04/01/2019 1050   CHOLHDL 3.3 04/01/2019 1050   VLDL 18 01/20/2017 1530   LDLCALC 68 04/01/2019 1050    Lab Results  Component Value Date   AST 14 04/01/2019   Lab Results  Component Value Date   ALT 15 04/01/2019    I reviewed the labs   Assessment &  Plan:    1. Elevated PSA Today's PSA pending  2. History of hematuria Hematuria workup in 2015 and 2018 - no worrisome lesions discovered No report of gross hematuria UA today is negative for [facility] RTC in one year for UA  3. BPH with LUTS IPSS score is 3/1, it is slightly improved  Continue conservative management, avoiding bladder irritants and timed voiding's Continue finasteride 5 mg daily; refills given RTC in 6 months for I PSS, PSA and exam  4. Adrenal adenoma Offered a referral to endocrinology again and patient is now willing to go for the consultation Referral placed   5. Atypical mole Willing to  see dermatology at this time - stressed the importance of following through with the referral to rule out melanoma as this type of cancer is very aggressive   Return in about 6 months (around 01/14/2020) for IPSS, PSA, UA and exam.  These notes generated with voice recognition software. I apologize for typographical errors.  Zara Council, PA-C  [facility] [address] [place], [place] [address] [phone]

## 2019-07-16 ENCOUNTER — Encounter: Payer: Self-pay | Admitting: Urology

## 2019-07-16 ENCOUNTER — Other Ambulatory Visit: Payer: Self-pay

## 2019-07-16 ENCOUNTER — Other Ambulatory Visit: Payer: Self-pay | Admitting: Urology

## 2019-07-16 ENCOUNTER — Ambulatory Visit (INDEPENDENT_AMBULATORY_CARE_PROVIDER_SITE_OTHER): Payer: Medicare HMO | Admitting: Urology

## 2019-07-16 VITALS — BP 148/80 | HR 96 | Ht 70.0 in | Wt 213.0 lb

## 2019-07-16 DIAGNOSIS — Z87448 Personal history of other diseases of urinary system: Secondary | ICD-10-CM

## 2019-07-16 DIAGNOSIS — R972 Elevated prostate specific antigen [PSA]: Secondary | ICD-10-CM

## 2019-07-16 DIAGNOSIS — D3502 Benign neoplasm of left adrenal gland: Secondary | ICD-10-CM

## 2019-07-16 DIAGNOSIS — N401 Enlarged prostate with lower urinary tract symptoms: Secondary | ICD-10-CM

## 2019-07-16 DIAGNOSIS — D229 Melanocytic nevi, unspecified: Secondary | ICD-10-CM | POA: Diagnosis not present

## 2019-07-16 LAB — URINALYSIS, COMPLETE
Bilirubin, UA: NEGATIVE
Glucose, UA: NEGATIVE
Ketones, UA: NEGATIVE
Leukocytes,UA: NEGATIVE
Nitrite, UA: NEGATIVE
RBC, UA: NEGATIVE
Specific Gravity, UA: >1.03 — ABNORMAL HIGH (ref 1.005–1.030)
Urobilinogen, Ur: 0.2 mg/dL (ref 0.2–1.0)
pH, UA: 5.5 (ref 5.0–7.5)

## 2019-07-16 LAB — MICROSCOPIC EXAMINATION
Bacteria, UA: NONE SEEN
RBC, Urine: NONE SEEN /hpf (ref 0–2)

## 2019-07-16 MED ORDER — FINASTERIDE 5 MG PO TABS: 5.0000 mg | ORAL_TABLET | Freq: Every day | ORAL | 3 refills | Status: DC

## 2019-07-17 ENCOUNTER — Telehealth: Payer: Self-pay

## 2019-07-17 LAB — PSA: Prostate Specific Ag, Serum: 4.1 ng/mL — ABNORMAL HIGH (ref 0.0–4.0)

## 2019-07-17 NOTE — Telephone Encounter (Signed)
-----   Message from Nori Riis, PA-C sent at 07/17/2019  8:22 AM EDT ----- Please let Mr. Tootle know that his PSA remains stable at 4.1.  We will see him in 6 months.

## 2019-07-17 NOTE — Telephone Encounter (Signed)
Left detailed message with results and recommendations.

## 2019-07-31 DIAGNOSIS — K644 Residual hemorrhoidal skin tags: Secondary | ICD-10-CM | POA: Diagnosis not present

## 2019-10-03 ENCOUNTER — Ambulatory Visit (INDEPENDENT_AMBULATORY_CARE_PROVIDER_SITE_OTHER): Payer: Medicare HMO | Admitting: Family Medicine

## 2019-10-03 ENCOUNTER — Encounter: Payer: Self-pay | Admitting: Family Medicine

## 2019-10-03 ENCOUNTER — Other Ambulatory Visit: Payer: Self-pay

## 2019-10-03 VITALS — BP 110/72 | HR 77 | Temp 97.6°F | Resp 18 | Ht 70.0 in | Wt 207.8 lb

## 2019-10-03 DIAGNOSIS — I10 Essential (primary) hypertension: Secondary | ICD-10-CM | POA: Diagnosis not present

## 2019-10-03 DIAGNOSIS — E785 Hyperlipidemia, unspecified: Secondary | ICD-10-CM

## 2019-10-03 DIAGNOSIS — N182 Chronic kidney disease, stage 2 (mild): Secondary | ICD-10-CM

## 2019-10-03 DIAGNOSIS — N401 Enlarged prostate with lower urinary tract symptoms: Secondary | ICD-10-CM | POA: Diagnosis not present

## 2019-10-03 DIAGNOSIS — R809 Proteinuria, unspecified: Secondary | ICD-10-CM | POA: Diagnosis not present

## 2019-10-03 DIAGNOSIS — E1129 Type 2 diabetes mellitus with other diabetic kidney complication: Secondary | ICD-10-CM | POA: Diagnosis not present

## 2019-10-03 DIAGNOSIS — R3911 Hesitancy of micturition: Secondary | ICD-10-CM | POA: Diagnosis not present

## 2019-10-03 MED ORDER — METFORMIN HCL 500 MG PO TABS: ORAL_TABLET | ORAL | 0 refills | Status: DC

## 2019-10-03 MED ORDER — ATORVASTATIN CALCIUM 40 MG PO TABS: ORAL_TABLET | ORAL | 1 refills | Status: DC

## 2019-10-03 MED ORDER — LISINOPRIL 2.5 MG PO TABS: 2.5000 mg | ORAL_TABLET | Freq: Every day | ORAL | 1 refills | Status: DC

## 2019-10-03 NOTE — Progress Notes (Signed)
Name: Scott Woods   MRN: [id number]    DOB: July 13, 1951   Date:10/03/2019       Progress Note  Subjective  Chief Complaint  Chief Complaint  Patient presents with  . Diabetes    6 month follow up  . Hypertension  . Hyperlipidemia    HPI  Diabetes Mellitus Patient is rx metformin. Takes medications as prescribed with no missed doses a month.  Diet: Low fat and somewhat low carbohydrate diet. Plans to set up annual eye exam. Denies blurry vision or vision changes. Takes Statin and ACEi.  Checks feet routinely.  Does not check sugars at home. Denies polyphagia, polydipsia, polyuria.   Hypertension w/ CKD Patient is on lisinopril 2.5mg  daily.  Due for labs Takes medications as prescribed with no missed doses a month.  No compliant with low-salt diet.  Denies chest pain, headaches, blurry vision, lightheadedness/dizziness, BLE edema, shortness of breath.  Hyperlipidemia Patient rx atorvastatin 40mg  nightly and ASA 81mg  daily. Takes medications as prescribed with no missed doses a month.  Denies myalgias or chest pain. Diet: Does not eat pork, very rarely has red meat.   BPH and elevated PSA  Follows up with urology- [facility] for monitoring of elevated PSA levels and takes finasteride for BPH.  No changes today.  Patient Active Problem List   Diagnosis Date Noted  . Benign prostatic hyperplasia with urinary hesitancy 04/01/2019  . Type 2 diabetes mellitus with microalbuminuria, without long-term current use of insulin ([place]) 11/16/2017  . Adrenal adenoma 05/08/2015  . Chronic kidney disease (CKD), stage II (mild) 05/08/2015  . Abnormal prostate specific antigen 05/08/2015  . Hyperlipidemia LDL goal <100 05/08/2015  . Hypertension, goal below 140/90 05/08/2015  . Overweight (BMI 25.0-29.9) 05/08/2015    Past Surgical History:  Procedure Laterality Date  . ADENOIDECTOMY    . COLONOSCOPY  11/18/2014  . PROCTOSCOPY  04/2014  . TONSILLECTOMY      Family History   Problem Relation Age of Onset  . Parkinson's disease Brother   . Heart disease Mother   . Diabetes Mellitus II Mother   . Coronary artery disease Mother   . Heart disease Father   . Prostate cancer Neg Hx   . Kidney cancer Neg Hx   . Bladder Cancer Neg Hx     Social History   Socioeconomic History  . Marital status: Single    Spouse name: Not on file  . Number of children: Not on file  . Years of education: Not on file  . Highest education level: Not on file  Occupational History  . Not on file  Tobacco Use  . Smoking status: Never Smoker  . Smokeless tobacco: Never Used  Substance and Sexual Activity  . Alcohol use: No    Alcohol/week: 0.0 standard drinks  . Drug use: No  . Sexual activity: Not Currently  Other Topics Concern  . Not on file  Social History Narrative  . Not on file   Social Determinants of Health   Financial Resource Strain:   . Difficulty of Paying Living Expenses: Not on file  Food Insecurity:   . Worried About Charity fundraiser in the Last Year: Not on file  . Ran Out of Food in the Last Year: Not on file  Transportation Needs:   . Lack of Transportation (Medical): Not on file  . Lack of Transportation (Non-Medical): Not on file  Physical Activity:   . Days of Exercise per Week: Not on file  .  Minutes of Exercise per Session: Not on file  Stress:   . Feeling of Stress : Not on file  Social Connections:   . Frequency of Communication with Friends and Family: Not on file  . Frequency of Social Gatherings with Friends and Family: Not on file  . Attends Religious Services: Not on file  . Active Member of Clubs or Organizations: Not on file  . Attends Archivist Meetings: Not on file  . Marital Status: Not on file  Intimate Partner Violence:   . Fear of Current or Ex-Partner: Not on file  . Emotionally Abused: Not on file  . Physically Abused: Not on file  . Sexually Abused: Not on file     Current Outpatient Medications:  .   aspirin EC 81 MG tablet, Take 1 tablet (81 mg total) by mouth daily., Disp: , Rfl:  .  atorvastatin (LIPITOR) 40 MG tablet, TAKE 1 TABLET BY MOUTH EVERYDAY AT BEDTIME, Disp: 90 tablet, Rfl: 1 .  finasteride (PROSCAR) 5 MG tablet, Take 1 tablet (5 mg total) by mouth daily., Disp: 90 tablet, Rfl: 3 .  lisinopril (ZESTRIL) 2.5 MG tablet, Take 1 tablet (2.5 mg total) by mouth daily., Disp: 90 tablet, Rfl: 1 .  metFORMIN (GLUCOPHAGE) 500 MG tablet, Take 1 tablet with breakfast and 1 tablet with dinner for 2 weeks then increase to 2 tablets with breakfast and 1 tablet with dinner daily after thereafter., Disp: 270 tablet, Rfl: 0  No Known Allergies  I personally reviewed active problem list, medication list, allergies, health maintenance, notes from last encounter, lab results with the patient/caregiver today.   ROS  Constitutional: Negative for fever or weight change.  Respiratory: Negative for cough and shortness of breath.   Cardiovascular: Negative for chest pain or palpitations.  Gastrointestinal: Negative for abdominal pain, no bowel changes.  Musculoskeletal: Negative for gait problem or joint swelling.  Skin: Negative for rash.  Neurological: Negative for dizziness or headache.  No other specific complaints in a complete review of systems (except as listed in HPI above).  Objective  Vitals:   10/03/19 0928  BP: 110/72  Pulse: 77  Resp: 18  Temp: 97.6 F (36.4 C)  TempSrc: Temporal  SpO2: 97%  Weight: 207 lb 12.8 oz (94.3 kg)  Height: 5\' 10"  (1.778 m)    Body mass index is 29.82 kg/m.  Physical Exam  Constitutional: Patient appears well-developed and well-nourished. No distress.  HENT: Head: Normocephalic and atraumatic.  Eyes: Conjunctivae and EOM are normal. No scleral icterus. Neck: Normal range of motion. Neck supple. No JVD present.  Cardiovascular: Normal rate, regular rhythm and normal heart sounds.  No murmur heard. No BLE edema. Pulmonary/Chest: Effort normal  and breath sounds normal. No respiratory distress. Musculoskeletal: Normal range of motion, no joint effusions. No gross deformities Neurological: Pt is alert and oriented to person, place, and time. No cranial nerve deficit. Coordination, balance, strength, speech and gait are normal.  Skin: Skin is warm and dry. No rash noted. No erythema.  Psychiatric: Patient has a normal mood and affect. behavior is normal. Judgment and thought content normal.   No results found for this or any previous visit (from the past 72 hour(s)).  Diabetic Foot Exam: Diabetic Foot Exam - Simple   Simple Foot Form Diabetic Foot exam was performed with the following findings: Yes 10/03/2019 10:00 AM  Visual Inspection No deformities, no ulcerations, no other skin breakdown bilaterally: Yes Sensation Testing Intact to touch and monofilament testing bilaterally:  Yes Pulse Check Posterior Tibialis and Dorsalis pulse intact bilaterally: Yes Comments     PHQ2/9: Depression screen [facility] 2/9 10/03/2019 04/01/2019 09/28/2018 02/13/2018 11/16/2017  Decreased Interest 0 0 0 0 0  Down, Depressed, Hopeless 0 0 0 0 0  PHQ - 2 Score 0 0 0 0 0  Altered sleeping 0 0 0 - -  Tired, decreased energy 0 0 0 - -  Change in appetite 0 0 0 - -  Feeling bad or failure about yourself  0 0 0 - -  Trouble concentrating 0 0 0 - -  Moving slowly or fidgety/restless 0 0 0 - -  Suicidal thoughts 0 0 0 - -  PHQ-9 Score 0 0 0 - -  Difficult doing work/chores Not difficult at all Not difficult at all Not difficult at all - -   PHQ-2/9 Result is negative.    Fall Risk: Fall Risk  10/03/2019 04/01/2019 09/28/2018 02/13/2018 11/16/2017  Falls in the past year? 0 0 0 No No  Number falls in past yr: 0 0 0 - -  Injury with Fall? 0 0 0 - -  Follow up Falls evaluation completed - - - -   Assessment & Plan  1. Type 2 diabetes mellitus with microalbuminuria, without long-term current use of insulin ([facility]) - metFORMIN (GLUCOPHAGE) 500 MG tablet;  Take 1 tablet with breakfast and 1 tablet with dinner for 2 weeks then increase to 2 tablets with breakfast and 1 tablet with dinner daily after thereafter.  Dispense: 270 tablet; Refill: 0 - Ambulatory referral to Ophthalmology - lisinopril (ZESTRIL) 2.5 MG tablet; Take 1 tablet (2.5 mg total) by mouth daily.  Dispense: 90 tablet; Refill: 1 - Hemoglobin A1c - COMPLETE METABOLIC PANEL WITH GFR  2. Hypertension, goal below 140/90 - At goal today; discussed DASH diet - lisinopril (ZESTRIL) 2.5 MG tablet; Take 1 tablet (2.5 mg total) by mouth daily.  Dispense: 90 tablet; Refill: 1 - COMPLETE METABOLIC PANEL WITH GFR  3. Hyperlipidemia LDL goal <10 - atorvastatin (LIPITOR) 40 MG tablet; TAKE 1 TABLET BY MOUTH EVERYDAY AT BEDTIME  Dispense: 90 tablet; Refill: 1 - Lipid panel  4. Benign prostatic hyperplasia with urinary hesitancy - Seeing urology  5. Chronic kidney disease (CKD), stage II (mild) - CMP is stable - lisinopril (ZESTRIL) 2.5 MG tablet; Take 1 tablet (2.5 mg total) by mouth daily.  Dispense: 90 tablet; Refill: 1

## 2019-10-04 ENCOUNTER — Other Ambulatory Visit: Payer: Self-pay | Admitting: Family Medicine

## 2019-10-04 DIAGNOSIS — E1129 Type 2 diabetes mellitus with other diabetic kidney complication: Secondary | ICD-10-CM

## 2019-10-04 LAB — LIPID PANEL
Cholesterol: 140 mg/dL (ref <200)
HDL: 40 mg/dL (ref >=40)
LDL Cholesterol (Calc): 76 mg/dL (calc)
Non-HDL Cholesterol (Calc): 100 mg/dL (calc) (ref <130)
Total CHOL/HDL Ratio: 3.5 (calc) (ref <5.0)
Triglycerides: 138 mg/dL (ref <150)

## 2019-10-04 LAB — COMPLETE METABOLIC PANEL WITH GFR
AG Ratio: 1.5 (calc) (ref 1.0–2.5)
ALT: 20 U/L (ref 9–46)
AST: 17 U/L (ref 10–35)
Albumin: 4.1 g/dL (ref 3.6–5.1)
Alkaline phosphatase (APISO): 87 U/L (ref 35–144)
BUN/Creatinine Ratio: 19 (calc) (ref 6–22)
BUN: 24 mg/dL (ref 7–25)
CO2: 27 mmol/L (ref 20–32)
Calcium: 9.1 mg/dL (ref 8.6–10.3)
Chloride: 104 mmol/L (ref 98–110)
Creat: 1.27 mg/dL — ABNORMAL HIGH (ref 0.70–1.25)
GFR, Est African American: 67 mL/min/{1.73_m2} (ref >=60)
GFR, Est Non African American: 58 mL/min/{1.73_m2} — ABNORMAL LOW (ref >=60)
Globulin: 2.7 g/dL (calc) (ref 1.9–3.7)
Glucose, Bld: 154 mg/dL — ABNORMAL HIGH (ref 65–99)
Potassium: 4.7 mmol/L (ref 3.5–5.3)
Sodium: 139 mmol/L (ref 135–146)
Total Bilirubin: 0.7 mg/dL (ref 0.2–1.2)
Total Protein: 6.8 g/dL (ref 6.1–8.1)

## 2019-10-04 LAB — HEMOGLOBIN A1C
Hgb A1c MFr Bld: 7.1 % of total Hgb — ABNORMAL HIGH (ref <5.7)
Mean Plasma Glucose: 157 (calc)
eAG (mmol/L): 8.7 (calc)

## 2019-10-04 MED ORDER — METFORMIN HCL 500 MG PO TABS: ORAL_TABLET | ORAL | 1 refills | Status: DC

## 2019-10-10 ENCOUNTER — Encounter: Payer: Self-pay | Admitting: Emergency Medicine

## 2019-10-10 ENCOUNTER — Emergency Department
Admission: EM | Admit: 2019-10-10 | Discharge: 2019-10-10 | Disposition: A | Discharge status: Home / Self Care | Payer: Medicare HMO | Attending: Student in an Organized Health Care Education/Training Program | Admitting: Student in an Organized Health Care Education/Training Program

## 2019-10-10 ENCOUNTER — Emergency Department: Payer: Medicare HMO

## 2019-10-10 ENCOUNTER — Other Ambulatory Visit: Payer: Self-pay

## 2019-10-10 DIAGNOSIS — S20212A Contusion of left front wall of thorax, initial encounter: Secondary | ICD-10-CM | POA: Insufficient documentation

## 2019-10-10 DIAGNOSIS — R0781 Pleurodynia: Secondary | ICD-10-CM | POA: Diagnosis not present

## 2019-10-10 DIAGNOSIS — N182 Chronic kidney disease, stage 2 (mild): Secondary | ICD-10-CM | POA: Diagnosis not present

## 2019-10-10 DIAGNOSIS — W01198A Fall on same level from slipping, tripping and stumbling with subsequent striking against other object, initial encounter: Secondary | ICD-10-CM | POA: Diagnosis not present

## 2019-10-10 DIAGNOSIS — Y9389 Activity, other specified: Secondary | ICD-10-CM | POA: Diagnosis not present

## 2019-10-10 DIAGNOSIS — I129 Hypertensive chronic kidney disease with stage 1 through stage 4 chronic kidney disease, or unspecified chronic kidney disease: Secondary | ICD-10-CM | POA: Insufficient documentation

## 2019-10-10 DIAGNOSIS — Z7984 Long term (current) use of oral hypoglycemic drugs: Secondary | ICD-10-CM | POA: Diagnosis not present

## 2019-10-10 DIAGNOSIS — S0181XA Laceration without foreign body of other part of head, initial encounter: Secondary | ICD-10-CM

## 2019-10-10 DIAGNOSIS — Z7982 Long term (current) use of aspirin: Secondary | ICD-10-CM | POA: Insufficient documentation

## 2019-10-10 DIAGNOSIS — Z79899 Other long term (current) drug therapy: Secondary | ICD-10-CM | POA: Insufficient documentation

## 2019-10-10 DIAGNOSIS — E1122 Type 2 diabetes mellitus with diabetic chronic kidney disease: Secondary | ICD-10-CM | POA: Diagnosis not present

## 2019-10-10 DIAGNOSIS — S299XXA Unspecified injury of thorax, initial encounter: Secondary | ICD-10-CM | POA: Insufficient documentation

## 2019-10-10 DIAGNOSIS — M542 Cervicalgia: Secondary | ICD-10-CM | POA: Diagnosis not present

## 2019-10-10 DIAGNOSIS — Y92480 Sidewalk as the place of occurrence of the external cause: Secondary | ICD-10-CM | POA: Insufficient documentation

## 2019-10-10 DIAGNOSIS — S0990XA Unspecified injury of head, initial encounter: Secondary | ICD-10-CM | POA: Diagnosis not present

## 2019-10-10 DIAGNOSIS — Y999 Unspecified external cause status: Secondary | ICD-10-CM | POA: Insufficient documentation

## 2019-10-10 DIAGNOSIS — S0083XA Contusion of other part of head, initial encounter: Secondary | ICD-10-CM

## 2019-10-10 DIAGNOSIS — S01112A Laceration without foreign body of left eyelid and periocular area, initial encounter: Secondary | ICD-10-CM | POA: Insufficient documentation

## 2019-10-10 DIAGNOSIS — W19XXXA Unspecified fall, initial encounter: Secondary | ICD-10-CM

## 2019-10-10 IMAGING — CT CT HEAD W/O CM
4 series · 16 of 47 positions shown, 18 images · non-contrast
Comparison: None.

CLINICAL DATA: Fall, left eyebrow laceration

EXAM:
CT HEAD WITHOUT CONTRAST
TECHNIQUE: Contiguous axial images were obtained from the base of the skull
through the vertex without intravenous contrast.

[Series 2: head wo · axial · 0.42mm/px · z∈[-86,+14]mm · 7 of 28 slices shown, 9 images]
[im 4/28  brain]
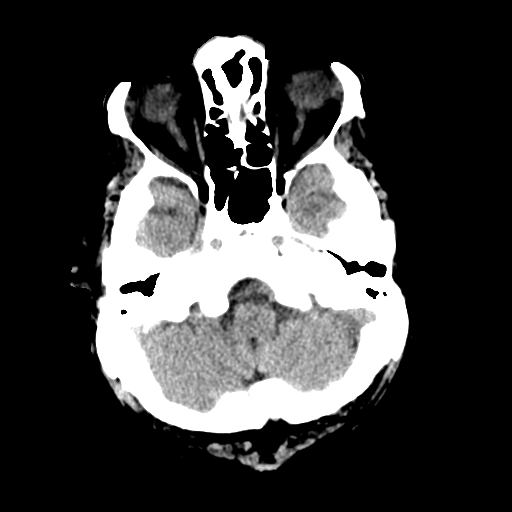
[im 4/28  bone]
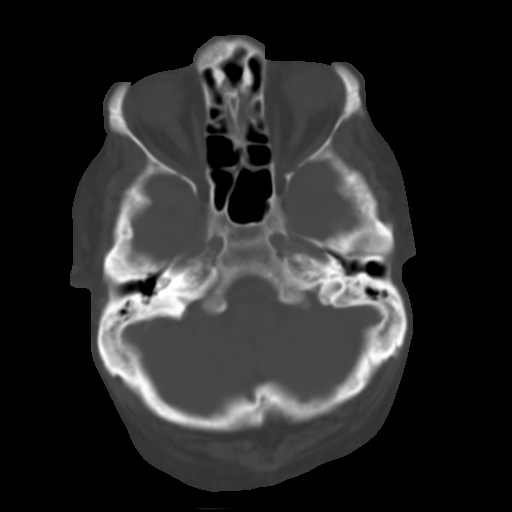
[im 7/28  brain]
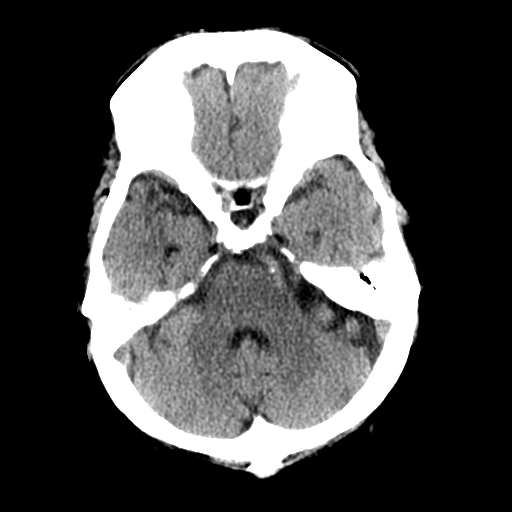
[im 11/28  brain]
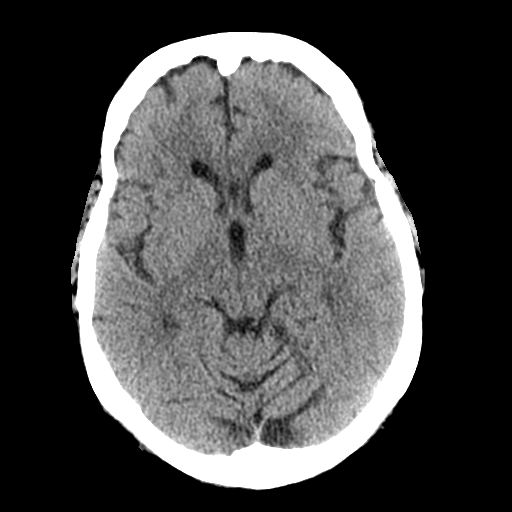
[im 14/28  brain]
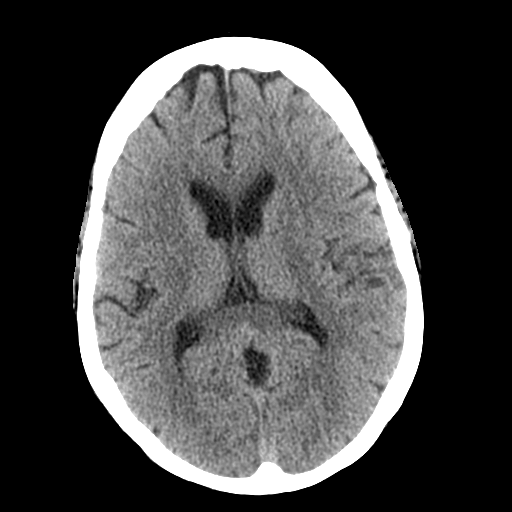
[im 17/28  brain]
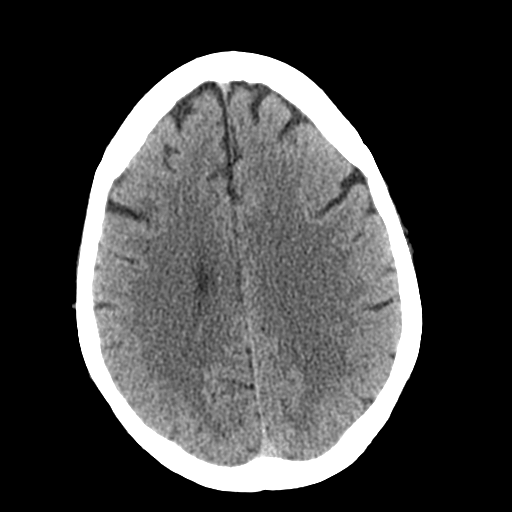
[im 17/28  bone]
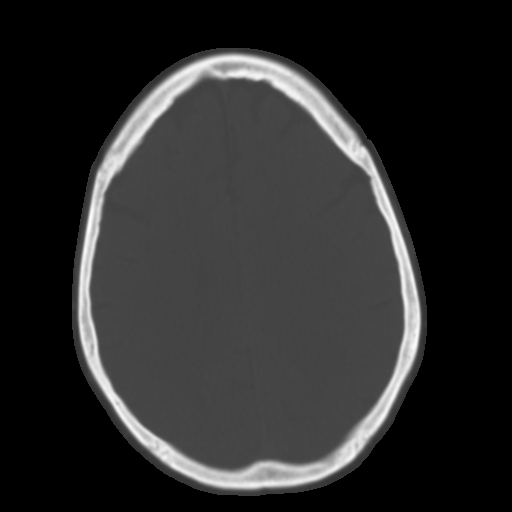
[im 21/28  brain]
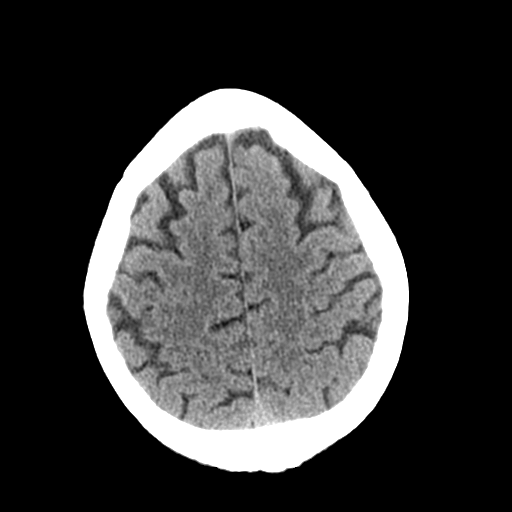
[im 24/28  brain]
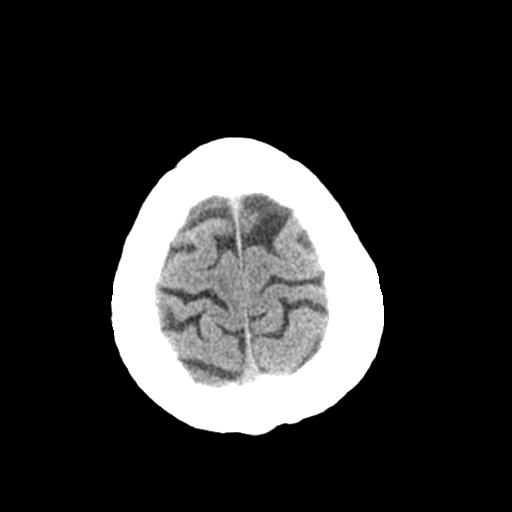

[Series 3: head bone · axial · 0.42mm/px · z∈[-89,-61]mm · 3 of 70 slices shown]
[im 7/70  bone]
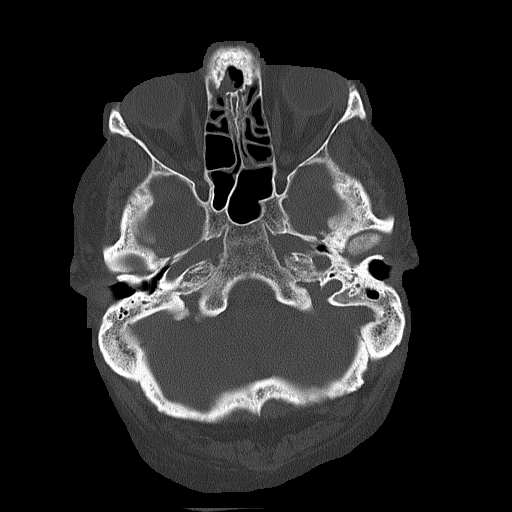
[im 14/70  bone]
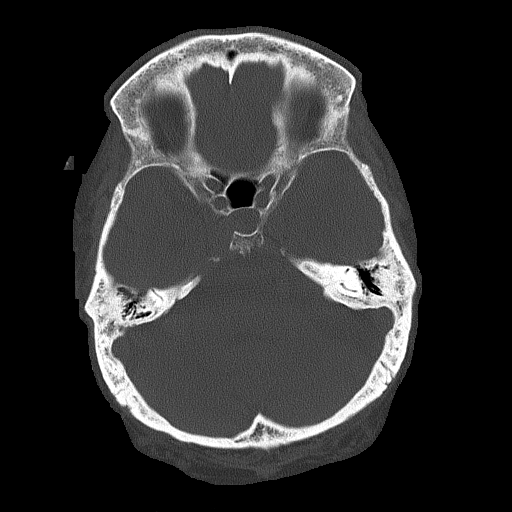
[im 21/70  bone]
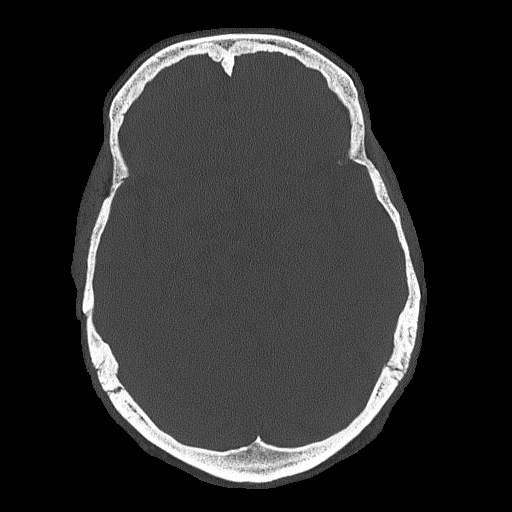

[Series 4: coronal soft tissue · coronal · 0.29mm/px · 3 of 68 slices shown]
[im 23/68  brain]
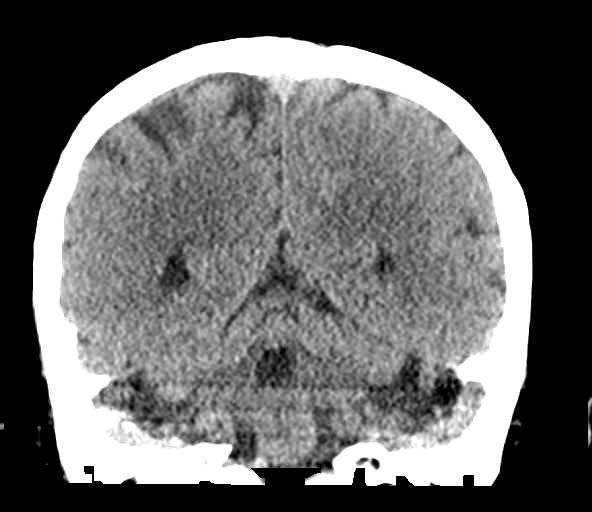
[im 30/68  brain]
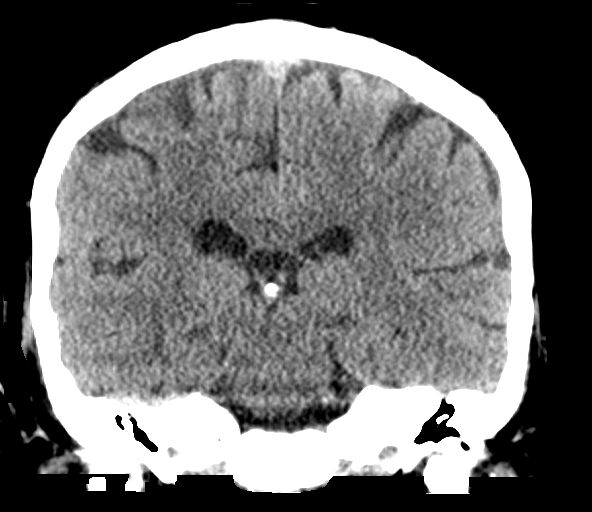
[im 38/68  brain]
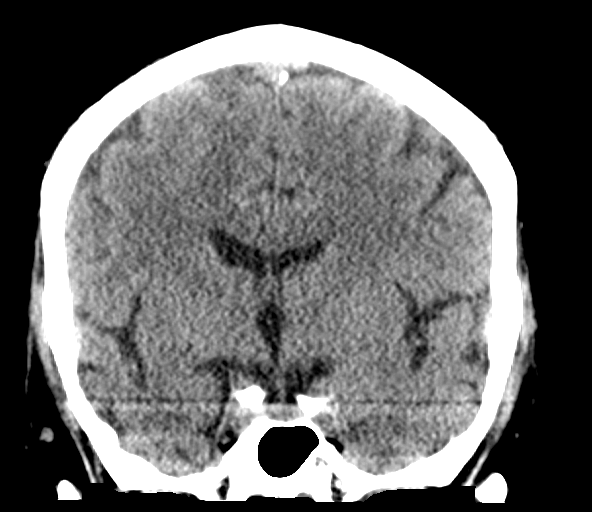

[Series 5: sagittal soft tissue · sagittal · 0.29mm/px · 3 of 57 slices shown]
[im 19/57  brain]
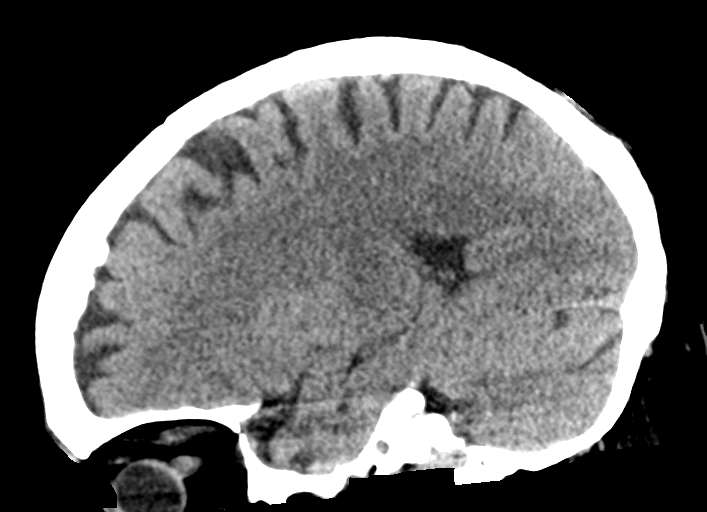
[im 29/57  brain]
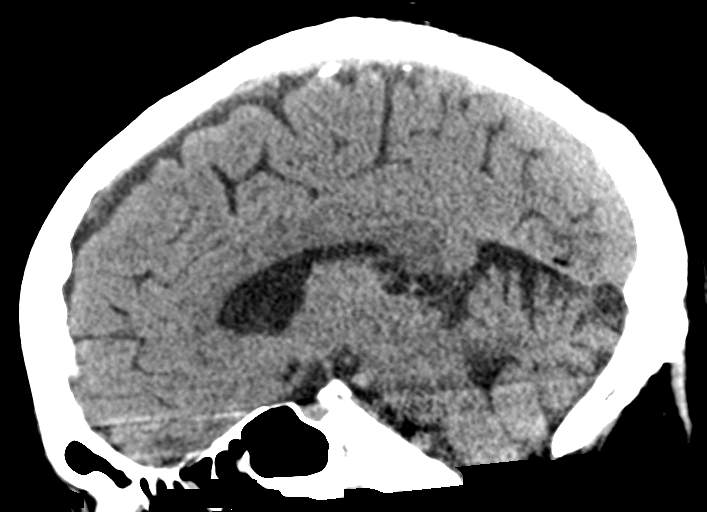
[im 38/57  brain]
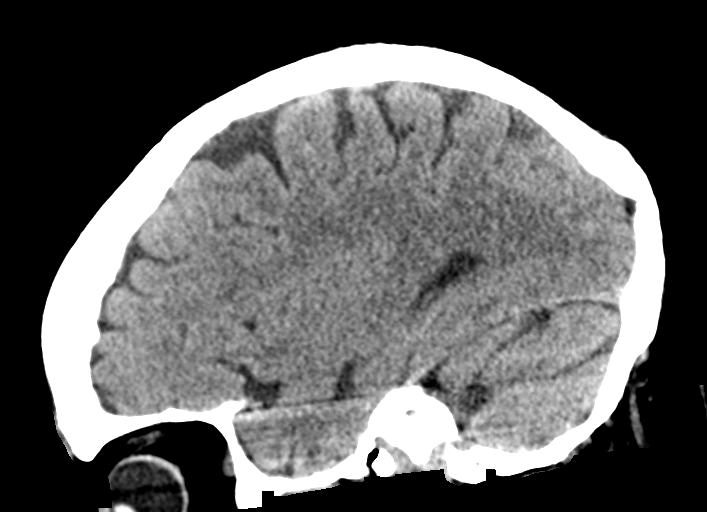

[16 of 47 positions shown; findings below may reference images not displayed]

FINDINGS: Brain: No evidence of acute infarction, hemorrhage, hydrocephalus,
extra-axial collection or mass lesion/mass effect.

Vascular: No hyperdense vessel or unexpected calcification.

Skull: Normal. Negative for fracture or focal lesion.

Sinuses/Orbits: No acute finding.

Other: There are a few small 1-2 mm hyperdense foci within the
cutaneous and subcutaneous soft tissues overlying the glabella,
which could reflect nonspecific soft tissue calcifications or small
foreign bodies. No focal soft tissue hematoma.
IMPRESSION: 1. No acute intracranial abnormality.
2. Few small 1-2 mm hyperdense foci within the cutaneous and
subcutaneous soft tissues overlying the glabella, which could
reflect nonspecific soft tissue calcifications or small foreign
bodies. No focal soft tissue hematoma.

## 2019-10-10 IMAGING — CR DG RIBS W/ CHEST 3+V*L*
1 series · 3 of 3 positions shown · non-contrast
Comparison: None.

CLINICAL DATA: Left-sided rib pain after fall

EXAM:
LEFT RIBS AND CHEST - 3+ VIEW

[Series 1: dg ribs unilateral w/chest left · 0.14mm/px · 3 of 3 slices shown]
[im 1/3]
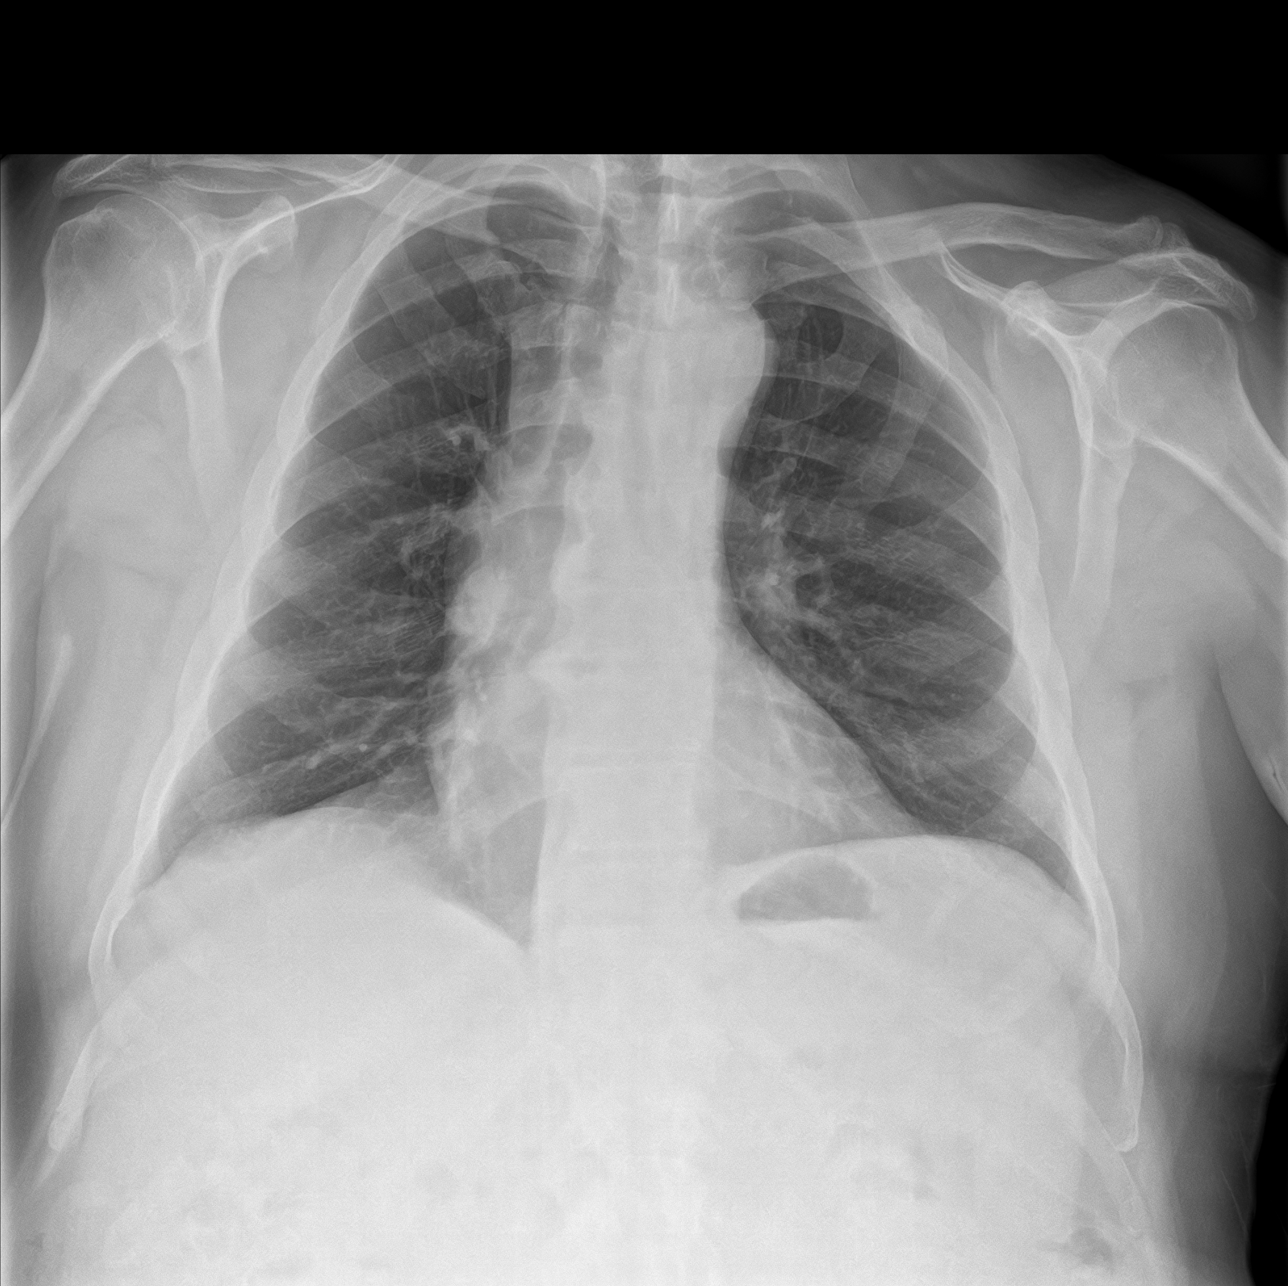
[im 2/3]
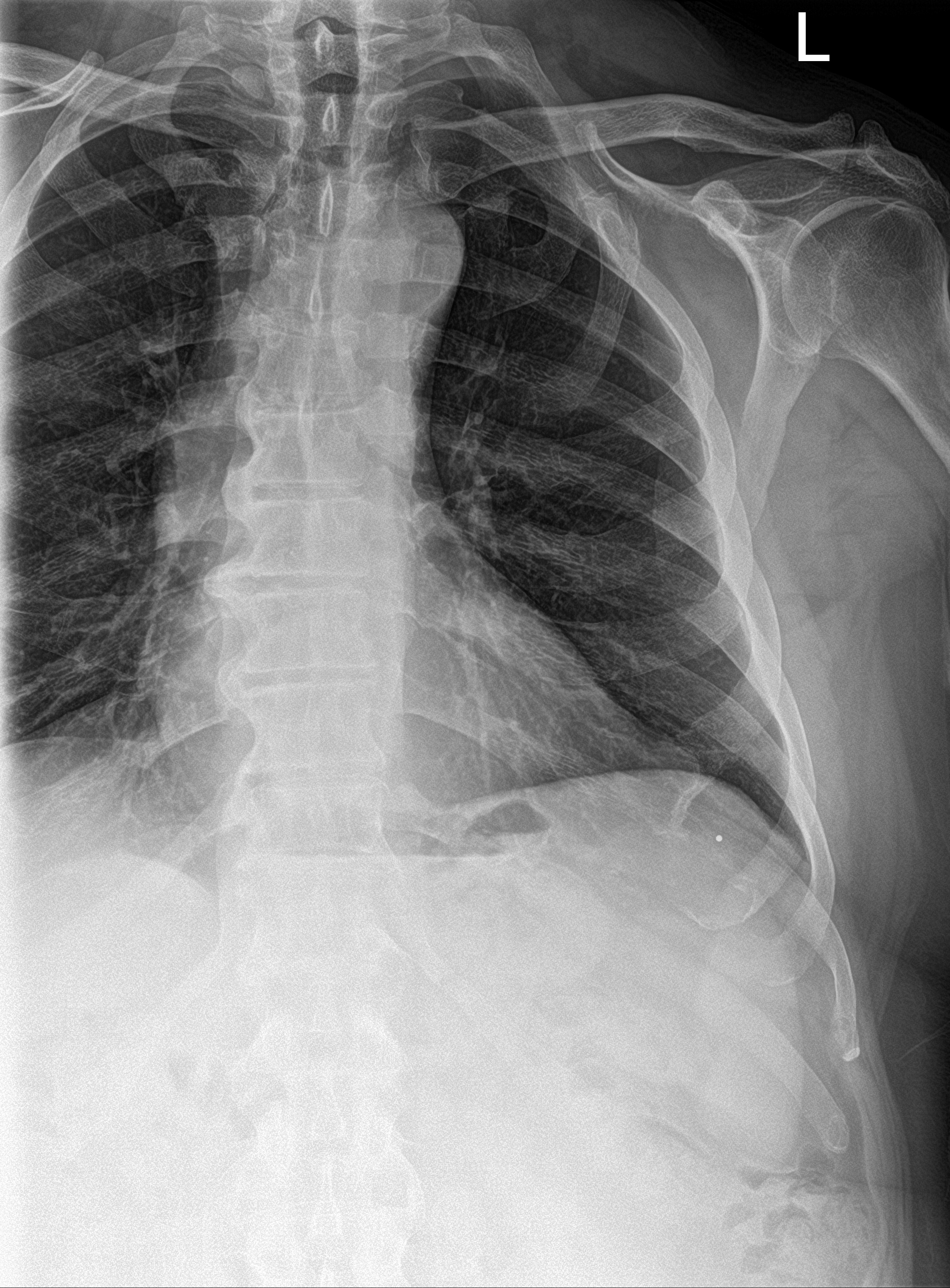
[im 3/3]
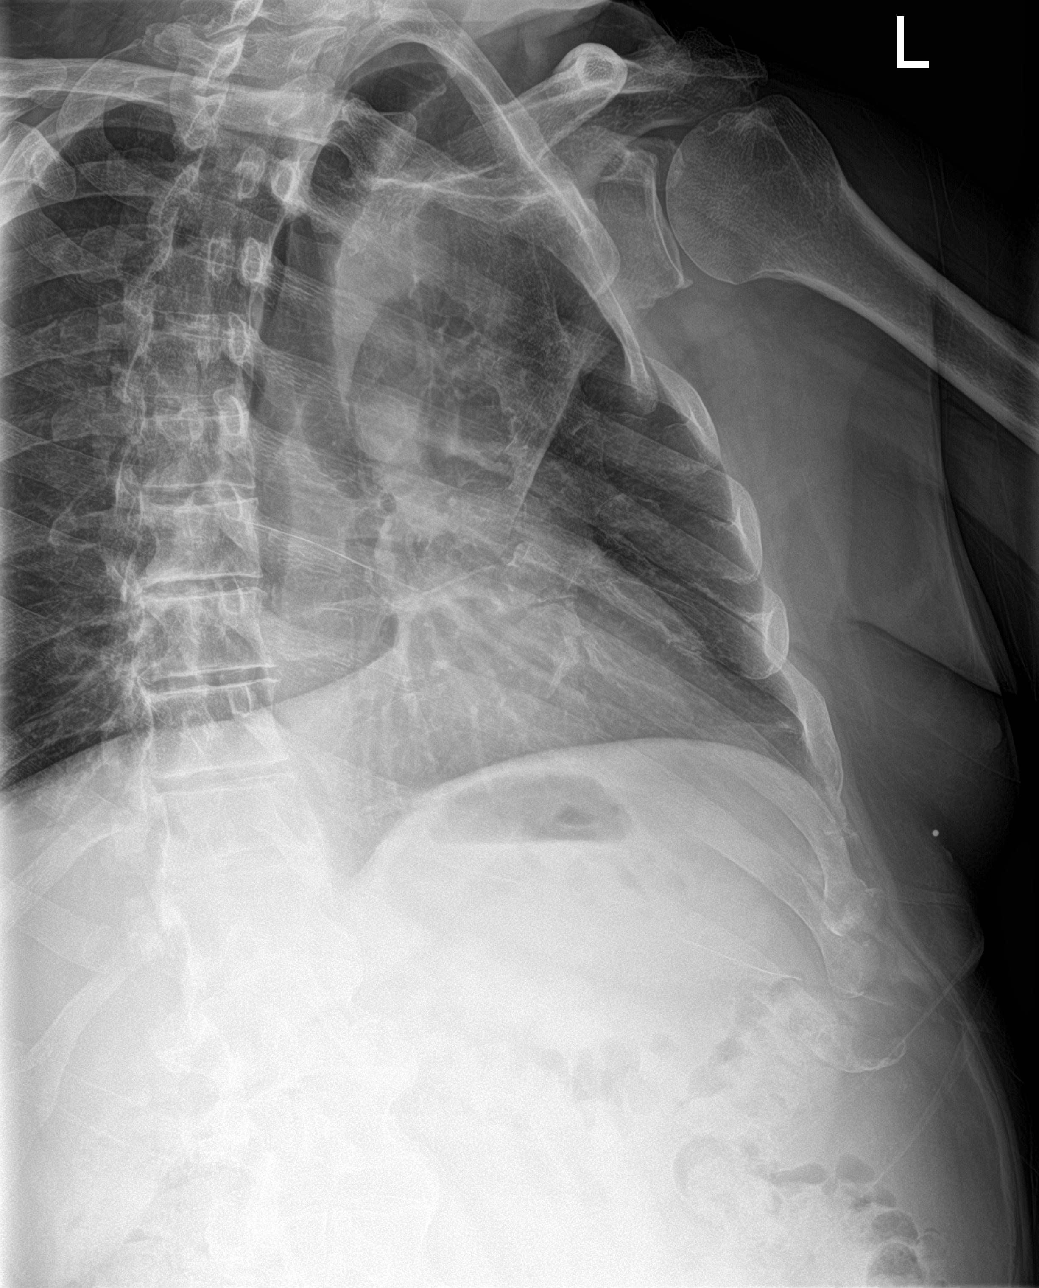

[3 of 3 positions shown; findings below may reference images not displayed]

FINDINGS: There is a fracture involving the lateral aspect of the left second
rib which appears to have corticated margins suggesting sequela of
remote trauma. Otherwise, no acute fractures are identified. The
left lung is clear. No pneumothorax.
IMPRESSION: Chronic appearing fracture involving the lateral aspect of the left
second rib which appears to have corticated margins suggesting
sequela of remote trauma. Otherwise, no acute fractures identified.

## 2019-10-10 IMAGING — CT CT CERVICAL SPINE W/O CM
3 of 4 series · 12 of 33 positions shown, 14 images · non-contrast
Comparison: None.

CLINICAL DATA: Fall, neck pain

EXAM:
CT CERVICAL SPINE WITHOUT CONTRAST
TECHNIQUE: Multidetector CT imaging of the cervical spine was performed without
intravenous contrast. Multiplanar CT image reconstructions were also
generated.

[Series 6: sagittal bone · sagittal · 0.26mm/px · 5 of 61 slices shown, 6 images]
[im 21/61  bone]
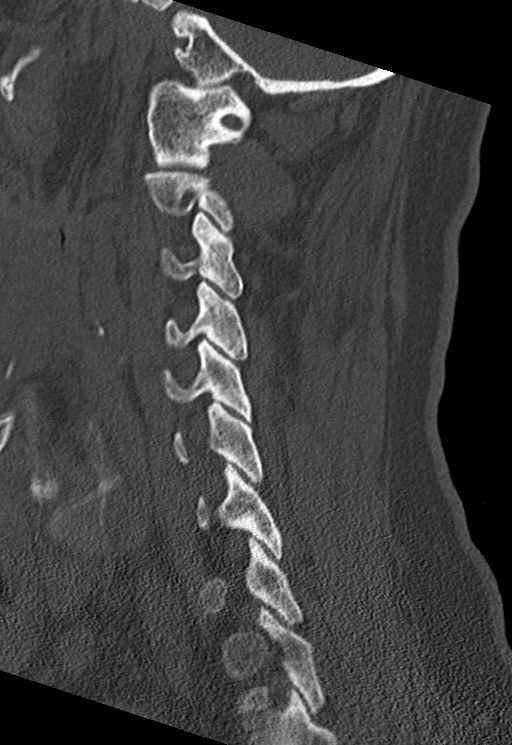
[im 26/61  bone]
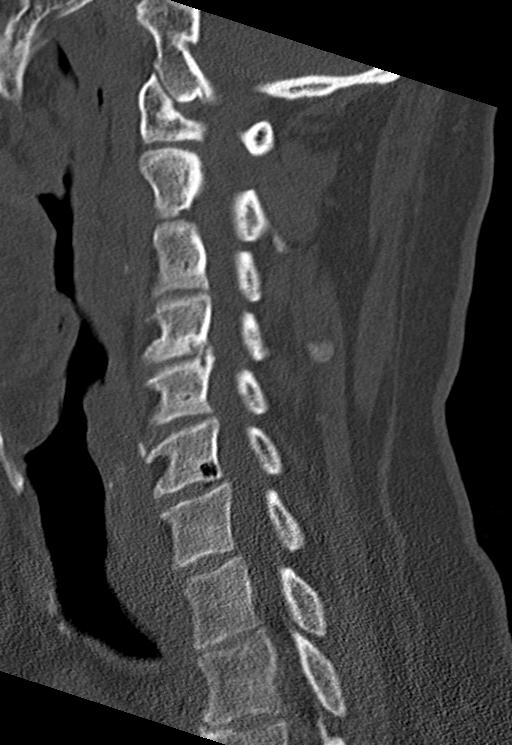
[im 31/61  soft-tissue]
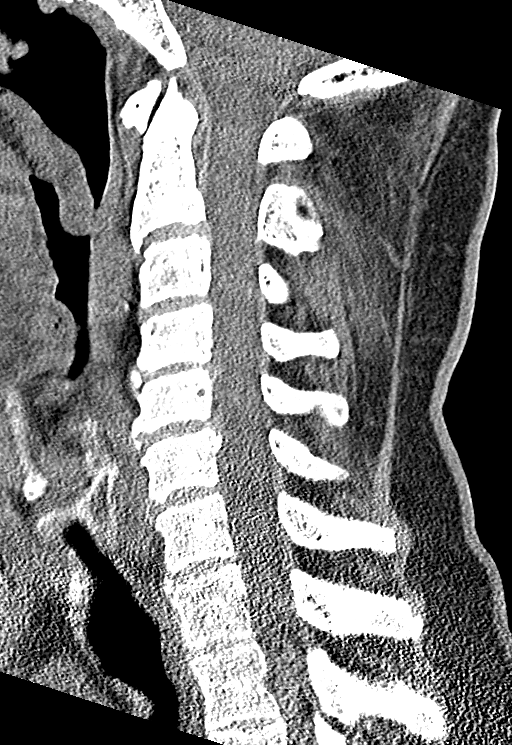
[im 31/61  bone]
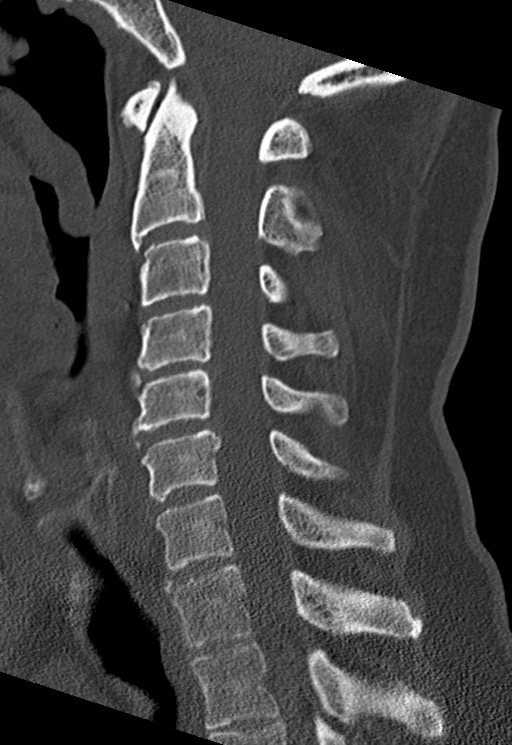
[im 36/61  bone]
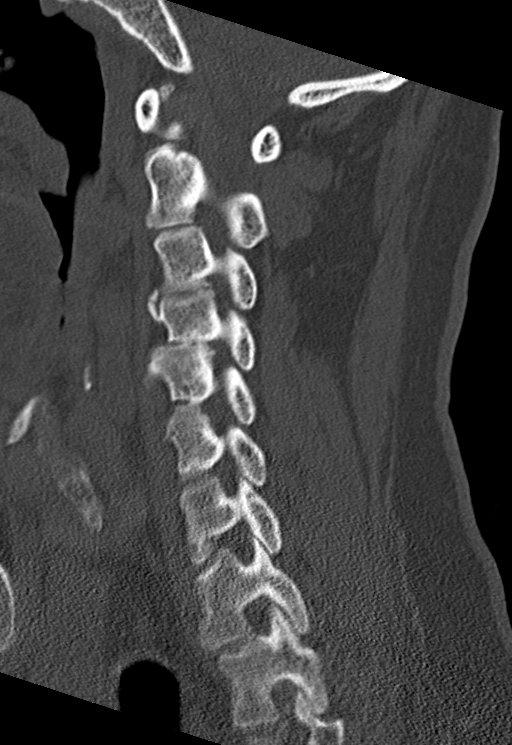
[im 41/61  bone]
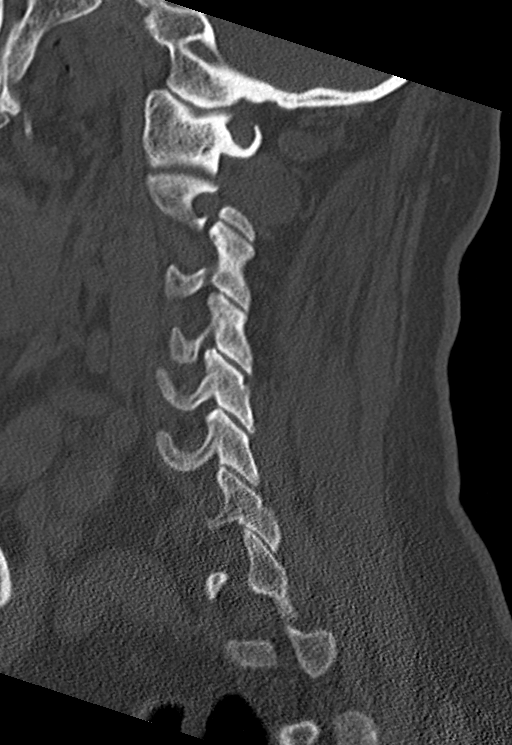

[Series 7: coronal bone · coronal · 0.23mm/px · 3 of 67 slices shown]
[im 14/67  bone]
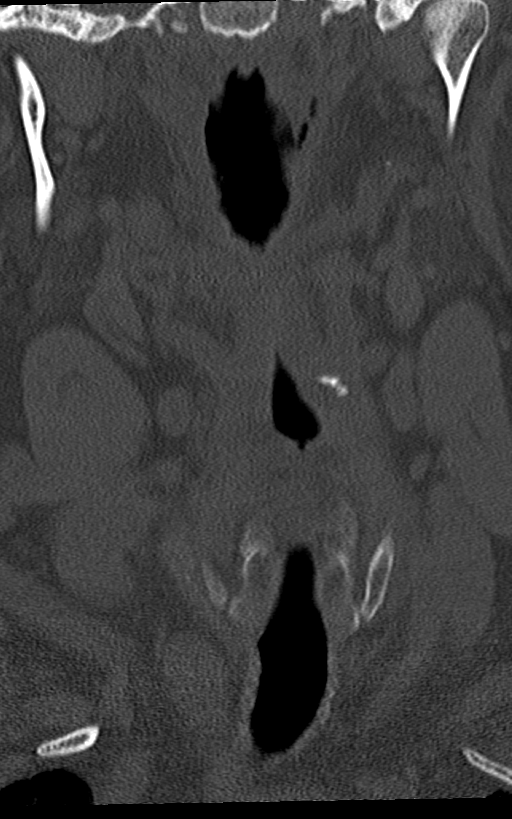
[im 27/67  bone]
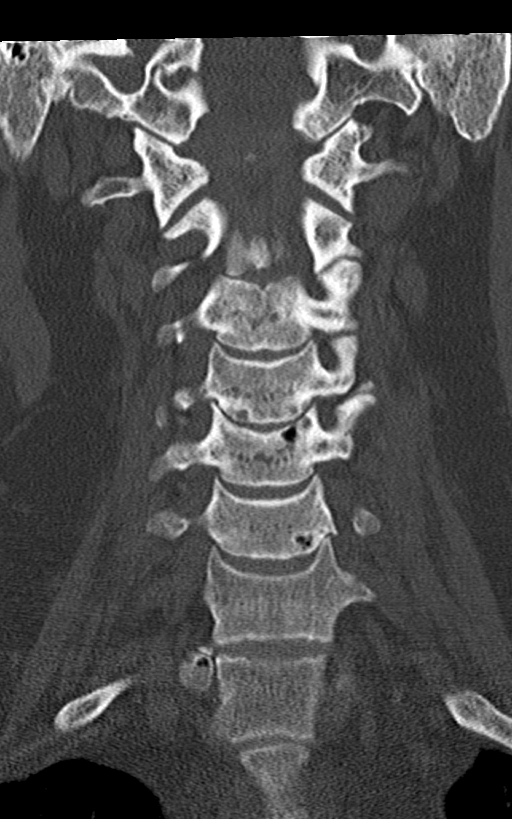
[im 40/67  bone]
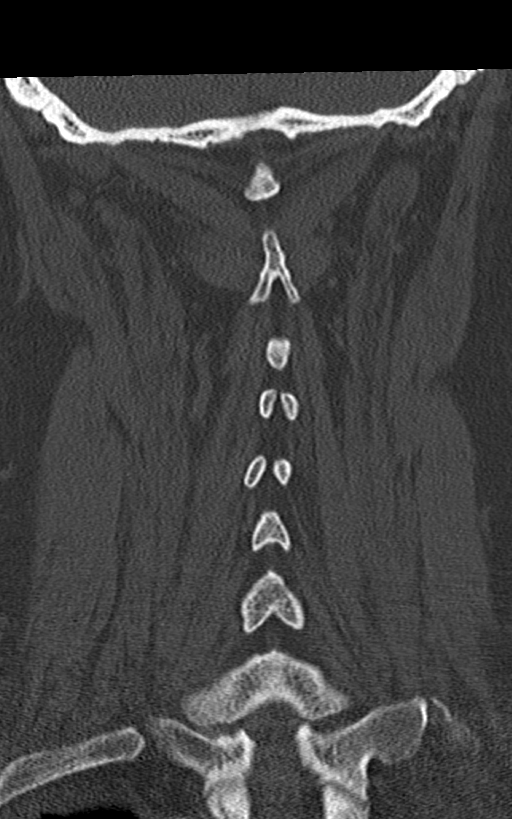

[Series 8: orthogonal bone · axial · 0.23mm/px · z∈[-267,-145]mm · 4 of 97 slices shown, 5 images]
[im 17/97  soft-tissue]
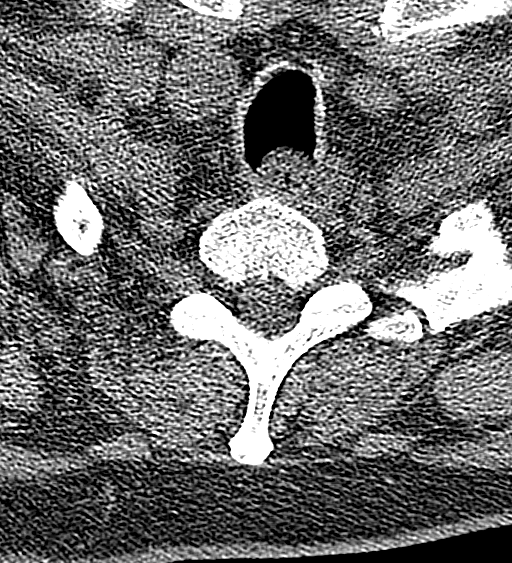
[im 17/97  bone]
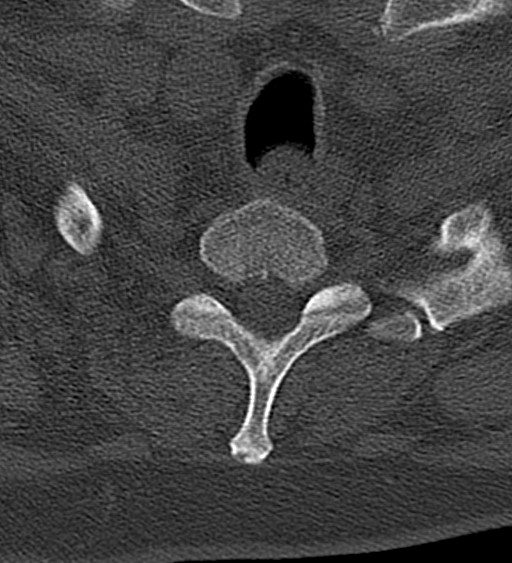
[im 33/97  bone]
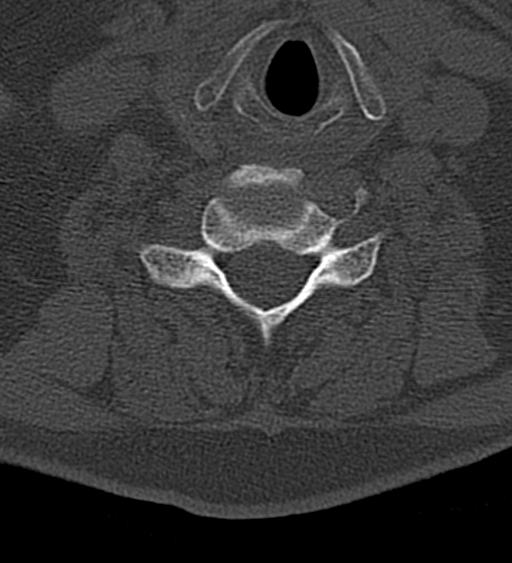
[im 65/97  bone]
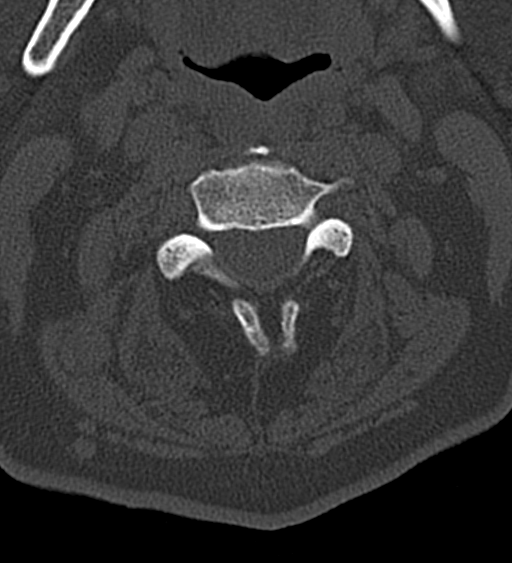
[im 81/97  bone]
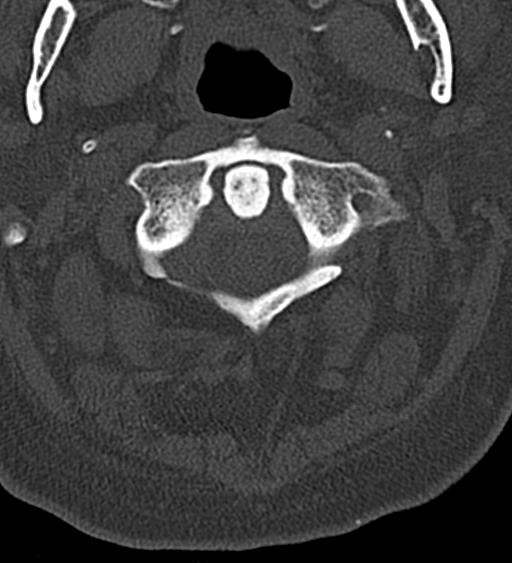

[12 of 33 positions shown; findings below may reference images not displayed]

FINDINGS: Alignment: No traumatic listhesis. Facet joints are aligned without
dislocation. Dens and lateral masses are aligned.

Skull base and vertebrae: No acute fracture. No primary bone lesion
or focal pathologic process.

Soft tissues and spinal canal: No prevertebral fluid or swelling. No
visible canal hematoma.

Disc levels: Minimal disc height loss at C4-5 where there is
degenerative endplate spurring and uncovertebral arthropathy.
Relatively mild facet joint arthropathy within the cervical spine.
No evidence of high-grade canal stenosis by CT.

Upper chest: Visualized lung apices clear.

Other: Medial course of the bilateral internal carotid arteries at
the level of the posterior oropharynx and hypopharynx.
IMPRESSION: 1. No evidence of acute fracture or traumatic listhesis.
2. Mild cervical spondylosis most pronounced at C4-5.
3. Medial course of the bilateral internal carotid arteries within
the retropharyngeal soft tissues.

## 2019-10-10 MED ORDER — LIDOCAINE-EPINEPHRINE-TETRACAINE (LET) TOPICAL GEL
3.0000 mL | Freq: Once | TOPICAL | Status: AC
  Administered 2019-10-10: 11:00:00 3 mL via TOPICAL

## 2019-10-10 NOTE — Discharge Instructions (Addendum)
Follow-up with your primary care provider if any continued problems.  Continue taking your medication that you regularly take as scheduled.  You may take Tylenol as needed for pain.  Keep the area clean and dry that was glued today.  The glue should start peeling off in approximately 5 to 7 days like dead skin.  Allow this to happen on its own and do not pull at it.  Return to the emergency department if any severe worsening of your symptoms or urgent concerns.  Also apply ice to your ribs as needed for discomfort.

## 2019-10-10 NOTE — ED Triage Notes (Signed)
Patient presents to the ED with an abrasion above his left eye and an abrasion to his left hand after a mechanical fall.  Patient states he tripped on a curb.  Patient denies loss of consciousness.  Patient ambulatory with steady gait.

## 2019-10-10 NOTE — ED Notes (Signed)
See triage note  Presents s/p fall  States he tripped over curb    abrasion noted to palm of left hand and above left eye    Denies any LOC  Also having some pain to left lateral chest wall

## 2019-10-10 NOTE — ED Provider Notes (Signed)
[facility] Emergency Department Provider Note  ____________________________________________   First MD Initiated Contact with Patient 10/10/19 [phone]     (approximate)  I have reviewed the triage vital signs and the nursing notes.   HISTORY  Chief Complaint Fall   HPI Scott Woods is a 69 y.o. male presents to the ED after falling and hitting his forehead on a curb.  Patient states that this was a mechanical fall as he tripped.  He has an abrasion/laceration above his left eye and abrasions to his left hand.  Patient denies any LOC, nausea, vomiting or change in vision.  He states that his tetanus is up-to-date.  Patient also landed on the left side of his chest but states that it "feels okay".  He routinely takes an aspirin a day.  He rates his pain as a 6/10.       Past Medical History:  Diagnosis Date  . Adrenal nodule ([place])   . Blood in the urine 05/08/2015  . Borderline diabetes   . Chronic kidney disease   . CKD (chronic kidney disease), stage II   . Diabetes mellitus without complication ([place])   . Elevated PSA   . Gross hematuria   . H/O type B viral hepatitis 05/08/2015   When he was a child   . History of hepatitis B   . HTN (hypertension)   . Hyperlipidemia with target LDL less than 100   . Obesity   . Pre-ulcerative calluses 03/28/2016   great toe   . Prostate cancer [facility])     Patient Active Problem List   Diagnosis Date Noted  . Benign prostatic hyperplasia with urinary hesitancy 04/01/2019  . Type 2 diabetes mellitus with microalbuminuria, without long-term current use of insulin ([place]) 11/16/2017  . Adrenal adenoma 05/08/2015  . Chronic kidney disease (CKD), stage II (mild) 05/08/2015  . Abnormal prostate specific antigen 05/08/2015  . Hyperlipidemia LDL goal <100 05/08/2015  . Hypertension, goal below 140/90 05/08/2015  . Overweight (BMI 25.0-29.9) 05/08/2015    Past Surgical History:  Procedure Laterality Date  .  ADENOIDECTOMY    . COLONOSCOPY  11/18/2014  . PROCTOSCOPY  04/2014  . TONSILLECTOMY      Prior to Admission medications   Medication Sig Start Date End Date Taking? Authorizing Provider  aspirin EC 81 MG tablet Take 1 tablet (81 mg total) by mouth daily. 04/02/16   Arnetha Courser, MD  atorvastatin (LIPITOR) 40 MG tablet TAKE 1 TABLET BY MOUTH EVERYDAY AT BEDTIME 10/03/19   Hubbard Hartshorn, FNP  finasteride (PROSCAR) 5 MG tablet Take 1 tablet (5 mg total) by mouth daily. 07/16/19   Zara Council A, PA-C  lisinopril (ZESTRIL) 2.5 MG tablet Take 1 tablet (2.5 mg total) by mouth daily. 10/03/19   Hubbard Hartshorn, FNP  metFORMIN (GLUCOPHAGE) 500 MG tablet Take 2 tablets twice daily with meals 10/04/19   Hubbard Hartshorn, FNP    Allergies Patient has no known allergies.  Family History  Problem Relation Age of Onset  . Parkinson's disease Brother   . Heart disease Mother   . Diabetes Mellitus II Mother   . Coronary artery disease Mother   . Heart disease Father   . Prostate cancer Neg Hx   . Kidney cancer Neg Hx   . Bladder Cancer Neg Hx     Social History Social History   Tobacco Use  . Smoking status: Never Smoker  . Smokeless tobacco: Never Used  Substance  Use Topics  . Alcohol use: No    Alcohol/week: 0.0 standard drinks  . Drug use: No    Review of Systems Constitutional: No fever/chills Eyes: No visual changes. ENT: No trauma. Cardiovascular: Denies chest pain. Respiratory: Denies shortness of breath. Gastrointestinal: No abdominal pain.  No nausea, no vomiting.  Musculoskeletal: Positive for left anterior chest wall pain. Skin: Positive for laceration left eyebrow. Neurological: Negative for headaches, focal weakness or numbness. ____________________________________________   PHYSICAL EXAM:  VITAL SIGNS: ED Triage Vitals  Enc Vitals Group     BP 10/10/19 0922 (!) 153/103     Pulse Rate 10/10/19 0922 (!) 116     Resp 10/10/19 0922 18     Temp 10/10/19 0922  98.9 F (37.2 C)     Temp Source 10/10/19 0922 Oral     SpO2 10/10/19 0922 98 %     Weight --      Height --      Head Circumference --      Peak Flow --      Pain Score 10/10/19 0924 6     Pain Loc --      Pain Edu? --      Excl. in [place]? --    Constitutional: Alert and oriented. Well appearing and in no acute distress. Eyes: Conjunctivae are normal. PERRL. EOMI. Head: Atraumatic. Nose: No trauma. Mouth/Throat: Mucous membranes are moist.  Oropharynx non-erythematous. Neck: No stridor.  No point tenderness on palpation of the cervical spine posteriorly. Cardiovascular: Normal rate, regular rhythm. Grossly normal heart sounds.  Good peripheral circulation. Respiratory: Normal respiratory effort.  No retractions. Lungs CTAB.  On second survey after using Dermabond on his laceration patient began complaining of left sided rib pain.  Patient was tender to palpation at approximately the seventh anterior rib without deformity noted. Gastrointestinal: Soft and nontender. No distention. Musculoskeletal: Moves upper and lower extremities with any difficulty.  Normal gait was noted. Neurologic:  Normal speech and language. No gross focal neurologic deficits are appreciated. No gait instability. Skin:  Skin is warm, dry.  There is superficial abrasion noted to the left hand.  There is laceration that appears to be superficial above the left eyebrow without noted foreign body.  Bleeding is controlled at this time. Psychiatric: Mood and affect are normal. Speech and behavior are normal.  ____________________________________________   LABS (all labs ordered are listed, but only abnormal results are displayed)  Labs Reviewed - No data to display  RADIOLOGY   Official radiology report(s): DG Ribs Unilateral W/Chest Left  Result Date: 10/10/2019 CLINICAL DATA:  Left-sided rib pain after fall EXAM: LEFT RIBS AND CHEST - 3+ VIEW COMPARISON:  None. FINDINGS: There is a fracture involving the  lateral aspect of the left second rib which appears to have corticated margins suggesting sequela of remote trauma. Otherwise, no acute fractures are identified. The left lung is clear. No pneumothorax. IMPRESSION: Chronic appearing fracture involving the lateral aspect of the left second rib which appears to have corticated margins suggesting sequela of remote trauma. Otherwise, no acute fractures identified. Electronically Signed   By: Davina Poke D.O.   On: 10/10/2019 12:23   CT Head Wo Contrast  Result Date: 10/10/2019 CLINICAL DATA:  Fall, left eyebrow laceration EXAM: CT HEAD WITHOUT CONTRAST TECHNIQUE: Contiguous axial images were obtained from the base of the skull through the vertex without intravenous contrast. COMPARISON:  None. FINDINGS: Brain: No evidence of acute infarction, hemorrhage, hydrocephalus, extra-axial collection or mass lesion/mass effect. Vascular:  No hyperdense vessel or unexpected calcification. Skull: Normal. Negative for fracture or focal lesion. Sinuses/Orbits: No acute finding. Other: There are a few small 1-2 mm hyperdense foci within the cutaneous and subcutaneous soft tissues overlying the glabella, which could reflect nonspecific soft tissue calcifications or small foreign bodies. No focal soft tissue hematoma. IMPRESSION: 1. No acute intracranial abnormality. 2. Few small 1-2 mm hyperdense foci within the cutaneous and subcutaneous soft tissues overlying the glabella, which could reflect nonspecific soft tissue calcifications or small foreign bodies. No focal soft tissue hematoma. Electronically Signed   By: Davina Poke D.O.   On: 10/10/2019 10:31   CT Cervical Spine Wo Contrast  Result Date: 10/10/2019 CLINICAL DATA:  Fall, neck pain EXAM: CT CERVICAL SPINE WITHOUT CONTRAST TECHNIQUE: Multidetector CT imaging of the cervical spine was performed without intravenous contrast. Multiplanar CT image reconstructions were also generated. COMPARISON:  None. FINDINGS:  Alignment: No traumatic listhesis. Facet joints are aligned without dislocation. Dens and lateral masses are aligned. Skull base and vertebrae: No acute fracture. No primary bone lesion or focal pathologic process. Soft tissues and spinal canal: No prevertebral fluid or swelling. No visible canal hematoma. Disc levels: Minimal disc height loss at C4-5 where there is degenerative endplate spurring and uncovertebral arthropathy. Relatively mild facet joint arthropathy within the cervical spine. No evidence of high-grade canal stenosis by CT. Upper chest: Visualized lung apices clear. Other: Medial course of the bilateral internal carotid arteries at the level of the posterior oropharynx and hypopharynx. IMPRESSION: 1. No evidence of acute fracture or traumatic listhesis. 2. Mild cervical spondylosis most pronounced at C4-5. 3. Medial course of the bilateral internal carotid arteries within the retropharyngeal soft tissues. Electronically Signed   By: Davina Poke D.O.   On: 10/10/2019 10:36    ____________________________________________   PROCEDURES  Procedure(s) performed (including Critical Care):  Marland KitchenMarland KitchenLaceration Repair  Date/Time: 10/10/2019 12:44 PM Performed by: Johnn Hai, PA-C Authorized by: Johnn Hai, PA-C   Consent:    Consent obtained:  Verbal   Consent given by:  Patient   Risks discussed:  Poor cosmetic result, infection and poor wound healing Anesthesia (see MAR for exact dosages):    Anesthesia method:  Topical application   Topical anesthetic:  LET Laceration details:    Location:  Face   Face location:  L eyebrow   Length (cm):  1.5 Pre-procedure details:    Preparation:  Imaging obtained to evaluate for foreign bodies Exploration:    Hemostasis achieved with:  LET   Contaminated: no   Treatment:    Area cleansed with:  Saline   Amount of cleaning:  Standard   Visualized foreign bodies/material removed: no   Skin repair:    Repair method:  Tissue  adhesive Approximation:    Approximation:  Close Post-procedure details:    Dressing:  Open (no dressing)   Patient tolerance of procedure:  Tolerated well, no immediate complications     ____________________________________________   INITIAL IMPRESSION / ASSESSMENT AND PLAN / ED COURSE  As part of my medical decision making, I reviewed the following data within the electronic MEDICAL RECORD NUMBER Notes from prior ED visits and [place] Controlled Substance Database   69 year old male presents to the ED after falling when he tripped on the curb going into a restaurant to eat.  Patient denies any loss of consciousness, nausea or vomiting.  Patient does have a laceration to his forehead over the left eyebrow and also continues to take aspirin therapy each  day.  Tetanus was up-to-date.  Patient CT head and cervical spine was negative.  He also later began complaining of left anterior rib pain.  Rib x-ray was suspicious for a previous second rib fracture but no acute changes were noted.  Patient was made aware.  He is instructed to keep the laceration clean and dry.  He will take Tylenol as needed for pain at this time and if any continued problems or concerns return to the emergency department or see his PCP.  ____________________________________________   FINAL CLINICAL IMPRESSION(S) / ED DIAGNOSES  Final diagnoses:  Facial laceration, initial encounter  Facial contusion, initial encounter  Fall, initial encounter  Contusion of left chest wall, initial encounter     ED Discharge Orders    None       Note:  This document was prepared using Dragon voice recognition software and may include unintentional dictation errors.    Johnn Hai, PA-C 10/10/19 1249    Merlyn Lot, MD 10/10/19 1539

## 2019-11-19 ENCOUNTER — Other Ambulatory Visit: Payer: Self-pay | Admitting: Emergency Medicine

## 2019-11-19 ENCOUNTER — Telehealth: Payer: Self-pay | Admitting: Family Medicine

## 2019-11-19 DIAGNOSIS — I10 Essential (primary) hypertension: Secondary | ICD-10-CM

## 2019-11-19 DIAGNOSIS — N182 Chronic kidney disease, stage 2 (mild): Secondary | ICD-10-CM

## 2019-11-19 DIAGNOSIS — E1129 Type 2 diabetes mellitus with other diabetic kidney complication: Secondary | ICD-10-CM

## 2019-11-19 NOTE — Telephone Encounter (Signed)
Needs lisinopril. He is out.

## 2019-11-19 NOTE — Telephone Encounter (Signed)
Would like a 30 day supply of BP medication Lisinopril called to [facility] [address]. He has misplaced medication and need enough to last until he can a refill from [facility]

## 2019-11-20 MED ORDER — LISINOPRIL 2.5 MG PO TABS: 2.5000 mg | ORAL_TABLET | Freq: Every day | ORAL | 0 refills | Status: DC

## 2019-12-04 DIAGNOSIS — E119 Type 2 diabetes mellitus without complications: Secondary | ICD-10-CM | POA: Diagnosis not present

## 2019-12-04 LAB — HM DIABETES EYE EXAM

## 2020-01-14 ENCOUNTER — Other Ambulatory Visit: Payer: Medicare HMO

## 2020-01-17 ENCOUNTER — Other Ambulatory Visit: Payer: Self-pay

## 2020-01-17 ENCOUNTER — Other Ambulatory Visit: Payer: Self-pay | Admitting: Family Medicine

## 2020-01-17 ENCOUNTER — Ambulatory Visit (INDEPENDENT_AMBULATORY_CARE_PROVIDER_SITE_OTHER): Payer: Medicare HMO | Admitting: Urology

## 2020-01-17 ENCOUNTER — Encounter: Payer: Self-pay | Admitting: Urology

## 2020-01-17 VITALS — BP 148/84 | HR 98 | Ht 70.0 in | Wt 198.8 lb

## 2020-01-17 DIAGNOSIS — R972 Elevated prostate specific antigen [PSA]: Secondary | ICD-10-CM | POA: Diagnosis not present

## 2020-01-17 DIAGNOSIS — D3502 Benign neoplasm of left adrenal gland: Secondary | ICD-10-CM | POA: Diagnosis not present

## 2020-01-17 DIAGNOSIS — Z87448 Personal history of other diseases of urinary system: Secondary | ICD-10-CM | POA: Diagnosis not present

## 2020-01-17 DIAGNOSIS — D229 Melanocytic nevi, unspecified: Secondary | ICD-10-CM | POA: Diagnosis not present

## 2020-01-17 DIAGNOSIS — N401 Enlarged prostate with lower urinary tract symptoms: Secondary | ICD-10-CM

## 2020-01-17 LAB — BLADDER SCAN AMB NON-IMAGING: Scan Result: 31

## 2020-01-17 NOTE — Progress Notes (Signed)
01/17/2020 12:11 PM   Scott Woods 1951-05-14 [id number]  Referring provider: Arnetha Courser, MD No address on file  Chief Complaint  Patient presents with  . Elevated PSA  . Benign Prostatic Hypertrophy    HPI: 69 yo male with an elevated PSA, history of hematuria and BPH with LUTS who presents today for 6 month follow-up.  Elevated PSA PSA was found to be 8.6 ng/mL on 10/10/2016 during a routine screening.  Patient was seen by Carlyle Lipa, NP in 2015 for a PSA of 9.5.  He did undergo a biopsy at that time and the biopsy was negative.  He was started on finasteride 5 mg daily in 10/2016.    PSA Trend  9.5 in 2015 - bx negative  8.6 on 10/10/16  9.2 on 11/02/2016 - started on finasteride   5.1 (10.2) on 05/24/2017  5.7 (11.4) on 11/27/2017  4.9 (9.8) on 05/31/2018  5.0 (10.0) on 01/08/2019   4.1 (8.2) on 07/16/2019  4.3 (8.6) on 01/17/2020  BPH with LUTS His IPSS score today is 2, which is mild lower urinary tract symptomatology.  He is pleased with his quality life due to his urinary symptoms.  His PVR is 31 mL.  His previous I PSS score was 2/1.  His previous PVR was 53 mL.  No urinary complaints at this visit.  Patient denies any modifying or aggravating factors.  Patient denies any gross hematuria, dysuria or suprapubic/flank pain.  Patient denies any fevers, chills, nausea or vomiting.   Patient is taking finasteride 5 mg daily.    IPSS    Row Name 01/17/20 0900         International Prostate Symptom Score   How often have you had the sensation of not emptying your bladder?  Not at All     How often have you had to urinate less than every two hours?  Less than 1 in 5 times     How often have you found you stopped and started again several times when you urinated?  Less than 1 in 5 times     How often have you found it difficult to postpone urination?  Not at All     How often have you had a weak urinary stream?  Not at All     How often have you had to  strain to start urination?  Not at All     How many times did you typically get up at night to urinate?  None     Total IPSS Score  2       Quality of Life due to urinary symptoms   If you were to spend the rest of your life with your urinary condition just the way it is now how would you feel about that?  Pleased        Score:  1-7 Mild 8-19 Moderate 20-35 Severe  History of hematuria He also had a hematuria workup for gross hematuria in 2015 with CT Urogram demonstrating a 1.2 cm left adrenal nodule.  Cystoscopy was negative.  Repeated hematuria workup with CTU and cystoscopy noted enlarged prostate Visually obstructive, 6 cm in length, and hypervascular and Mild trabeculation. One small diverticulum noted in posterior wall of bladder in 11/24/2016.   He does not report any gross hematuria.  Today's UA is negative for micro heme.  Left adrenal adenoma 1.2 cm adrenal nodule seen on CTU in 04/2014 and seen again on CTU in 11/2016.  He  was referred to endocrinology, but they were unable to get in contact with the patient.   PMH: Past Medical History:  Diagnosis Date  . Adrenal nodule ([place])   . Blood in the urine 05/08/2015  . Borderline diabetes   . Chronic kidney disease   . CKD (chronic kidney disease), stage II   . Diabetes mellitus without complication ([place])   . Elevated PSA   . Gross hematuria   . H/O type B viral hepatitis 05/08/2015   When he was a child   . History of hepatitis B   . HTN (hypertension)   . Hyperlipidemia with target LDL less than 100   . Obesity   . Pre-ulcerative calluses 03/28/2016   great toe   . Prostate cancer [facility])     Surgical History: Past Surgical History:  Procedure Laterality Date  . ADENOIDECTOMY    . COLONOSCOPY  11/18/2014  . PROCTOSCOPY  04/2014  . TONSILLECTOMY      Home Medications:  Allergies as of 01/17/2020   No Known Allergies     Medication List       Accurate as of January 17, 2020 11:59 PM. If you have any questions,  ask your nurse or doctor.        aspirin EC 81 MG tablet Take 1 tablet (81 mg total) by mouth daily.   atorvastatin 40 MG tablet Commonly known as: LIPITOR TAKE 1 TABLET BY MOUTH EVERYDAY AT BEDTIME   finasteride 5 MG tablet Commonly known as: Proscar Take 1 tablet (5 mg total) by mouth daily.   lisinopril 2.5 MG tablet Commonly known as: ZESTRIL Take 1 tablet (2.5 mg total) by mouth daily.   metFORMIN 500 MG tablet Commonly known as: GLUCOPHAGE Take 2 tablets twice daily with meals       Allergies: No Known Allergies  Family History: Family History  Problem Relation Age of Onset  . Parkinson's disease Brother   . Heart disease Mother   . Diabetes Mellitus II Mother   . Coronary artery disease Mother   . Heart disease Father   . Prostate cancer Neg Hx   . Kidney cancer Neg Hx   . Bladder Cancer Neg Hx     Social History:  reports that he has never smoked. He has never used smokeless tobacco. He reports that he does not drink alcohol or use drugs.  ROS: For pertinent review of systems please refer to history of present illness  Physical Exam: BP (!) 148/84   Pulse 98   Ht 5\' 10"  (1.778 m)   Wt 198 lb 12.8 oz (90.2 kg)   BMI 28.52 kg/m   Constitutional:  Well nourished. Alert and oriented, No acute distress. HEENT: [place] AT, mask in place.  Trachea midline. Cardiovascular: No clubbing, cyanosis, or edema. Respiratory: Normal respiratory effort, no increased work of breathing. GI: Abdomen is soft, non tender, non distended, no abdominal masses.  GU: No CVA tenderness.  No bladder fullness or masses.  Patient with uncircumcised phallus. Foreskin easily retracted  Urethral meatus is patent.  No penile discharge. No penile lesions or rashes. Scrotum without lesions, cysts, rashes and/or edema.  Testicles are located scrotally bilaterally. No masses are appreciated in the testicles. Left and right epididymis are normal. Rectal: Patient with  normal sphincter tone.  Anus and perineum without scarring or rashes. No rectal masses are appreciated. Prostate is approximately 50 grams, no nodules are appreciated. Seminal vesicles could not be palpated Skin: No rashes, bruises. A 10  mm x 6 mm mole is seen in the left groin with asymmetrical borders, mixture of brown and tan in color, with a smooth surface.   Lymph: No inguinal adenopathy. Neurologic: Grossly intact, no focal deficits, moving all 4 extremities. Psychiatric: Normal mood and affect.    Laboratory Data: Urinalysis Component     Latest Ref Rng & Units 01/17/2020  Specific Gravity, UA     1.005 - 1.030 1.025  pH, UA     5.0 - 7.5 5.0  Color, UA     Yellow Yellow  Appearance Ur     Clear Clear  Leukocytes,UA     Negative Negative  Protein,UA     Negative/Trace Trace (A)  Glucose, UA     Negative Trace (A)  Ketones, UA     Negative Trace (A)  RBC, UA     Negative Negative  Bilirubin, UA     Negative Negative  Urobilinogen, Ur     0.2 - 1.0 mg/dL 0.2  Nitrite, UA     Negative Negative  Microscopic Examination      See below:   Component     Latest Ref Rng & Units 01/17/2020  WBC, UA     0 - 5 /hpf 0-5  RBC     0 - 2 /hpf None seen  Epithelial Cells (non renal)     0 - 10 /hpf 0-10  Casts     None seen /lpf Present (A)  Cast Type     N/A Hyaline casts  Bacteria, UA     None seen/Few None seen    Lab Results  Component Value Date   WBC 7.5 11/06/2018   HGB 13.8 11/06/2018   HCT 40.1 11/06/2018   MCV 88.9 11/06/2018   PLT 202 11/06/2018    Lab Results  Component Value Date   CREATININE 1.27 (H) 10/03/2019    Lab Results  Component Value Date   HGBA1C 7.1 (H) 10/03/2019       Component Value Date/Time   CHOL 140 10/03/2019 1012   HDL 40 10/03/2019 1012   CHOLHDL 3.5 10/03/2019 1012   VLDL 18 01/20/2017 1530   LDLCALC 76 10/03/2019 1012    Lab Results  Component Value Date   AST 17 10/03/2019   Lab Results  Component Value Date   ALT 20  10/03/2019   I reviewed the labs   Assessment & Plan:    1. Elevated PSA Today's PSA pending  2. History of hematuria Hematuria workup in 2015 and 2018 - no worrisome lesions discovered No report of gross hematuria UA today is negative for [facility] RTC in one year for UA  3. BPH with LUTS IPSS score is 2/1, it is slightly improved  Continue conservative management, avoiding bladder irritants and timed voiding's Continue finasteride 5 mg daily RTC in 6 months for I PSS, PSA and exam - if PSA stable   4. Adrenal adenoma Patient has not yet seen endocrinology Refer again to endocrinology   5. Atypical mole Was seen by dermatology, but a skin tag near the anus was addressed not the mole in question Refer again to dermatology  Return in about 6 months (around 07/18/2020) for IPSS, PSA and exam.  These notes generated with voice recognition software. I apologize for typographical errors.  Zara Council, PA-C  [facility] [address] [place], [place] [address] [phone]

## 2020-01-18 LAB — PSA: Prostate Specific Ag, Serum: 4.3 ng/mL — ABNORMAL HIGH (ref 0.0–4.0)

## 2020-01-20 ENCOUNTER — Telehealth: Payer: Self-pay | Admitting: Family Medicine

## 2020-01-20 LAB — URINALYSIS, COMPLETE
Bilirubin, UA: NEGATIVE
Leukocytes,UA: NEGATIVE
Nitrite, UA: NEGATIVE
RBC, UA: NEGATIVE
Specific Gravity, UA: 1.025 (ref 1.005–1.030)
Urobilinogen, Ur: 0.2 mg/dL (ref 0.2–1.0)
pH, UA: 5 (ref 5.0–7.5)

## 2020-01-20 LAB — MICROSCOPIC EXAMINATION
Bacteria, UA: NONE SEEN
RBC, Urine: NONE SEEN /hpf (ref 0–2)

## 2020-01-20 NOTE — Telephone Encounter (Signed)
Patient notified and voiced understanding.

## 2020-01-20 NOTE — Telephone Encounter (Signed)
-----   Message from Nori Riis, PA-C sent at 01/20/2020  8:11 AM EDT ----- Please let Mr. Kern know that his PSA is stable at 4.3.

## 2020-02-05 ENCOUNTER — Telehealth: Payer: Self-pay | Admitting: Urology

## 2020-02-05 NOTE — Telephone Encounter (Signed)
Would you please let Scott Woods know that we need to refer him back to dermatology to make sure they look at his mole in his left groin and we need to refer him to endocrinology for the adrenal nodule?

## 2020-02-05 NOTE — Telephone Encounter (Signed)
LMOM for patient to return call.

## 2020-02-06 NOTE — Telephone Encounter (Signed)
Patient notified

## 2020-02-16 ENCOUNTER — Other Ambulatory Visit: Payer: Self-pay | Admitting: Family Medicine

## 2020-02-16 DIAGNOSIS — I10 Essential (primary) hypertension: Secondary | ICD-10-CM

## 2020-02-16 DIAGNOSIS — N182 Chronic kidney disease, stage 2 (mild): Secondary | ICD-10-CM

## 2020-02-16 DIAGNOSIS — E1129 Type 2 diabetes mellitus with other diabetic kidney complication: Secondary | ICD-10-CM

## 2020-02-16 NOTE — Telephone Encounter (Signed)
Requested Prescriptions  Pending Prescriptions Disp Refills  . lisinopril (ZESTRIL) 2.5 MG tablet [Pharmacy Med Name: LISINOPRIL 2.5 MG TABLET] 90 tablet 0    Sig: TAKE 1 TABLET BY MOUTH EVERY DAY     Cardiovascular:  ACE Inhibitors Failed - 02/16/2020 10:05 AM      Failed - Cr in normal range and within 180 days    Creat  Date Value Ref Range Status  10/03/2019 1.27 (H) 0.70 - 1.25 mg/dL Final    Comment:    For patients >68 years of age, the reference limit for Creatinine is approximately 13% higher for people identified as African-American. .    Creatinine, Urine  Date Value Ref Range Status  09/28/2018 104 20 - 320 mg/dL Final         Failed - Last BP in normal range    BP Readings from Last 1 Encounters:  01/17/20 (!) 148/84         Passed - K in normal range and within 180 days    Potassium  Date Value Ref Range Status  10/03/2019 4.7 3.5 - 5.3 mmol/L Final         Passed - Patient is not pregnant      Passed - Valid encounter within last 6 months    Recent Outpatient Visits          4 months ago Type 2 diabetes mellitus with microalbuminuria, without long-term current use of insulin [facility])   [place], Astrid Divine, FNP   10 months ago Hypertension, goal below 140/90   [facility], [place], NP   1 year ago Type 2 diabetes mellitus with microalbuminuria, without long-term current use of insulin [facility])   [place], Satira Anis, MD   2 years ago Type 2 diabetes mellitus with microalbuminuria, without long-term current use of insulin [facility])   [place], Satira Anis, MD   2 years ago Type 2 diabetes mellitus with microalbuminuria, without long-term current use of insulin [facility])   [place], Satira Anis, MD      Future Appointments            In 1 month [name], PA-C [facility], [place]   In 5 months [place], [facility]

## 2020-02-18 DIAGNOSIS — B356 Tinea cruris: Secondary | ICD-10-CM | POA: Diagnosis not present

## 2020-03-10 ENCOUNTER — Telehealth: Payer: Self-pay | Admitting: Family Medicine

## 2020-03-10 ENCOUNTER — Other Ambulatory Visit: Payer: Self-pay | Admitting: Urology

## 2020-03-10 DIAGNOSIS — R972 Elevated prostate specific antigen [PSA]: Secondary | ICD-10-CM

## 2020-03-10 NOTE — Chronic Care Management (AMB) (Signed)
  Chronic Care Management   Outreach Note  03/10/2020 Name: Scott Woods MRN: [id number] DOB: 1951/05/05  Scott Woods is a 69 y.o. year old male who is a primary care patient of Hubbard Hartshorn, FNP. I reached out to [name] by phone today in response to a referral sent by [name] health plan.     An unsuccessful telephone outreach was attempted today. The patient was referred to the case management team for assistance with care management and care coordination.   Follow Up Plan: A HIPPA compliant phone message was left for the patient providing contact information and requesting a return call.  The care management team will reach out to the patient again over the next 7 days.  If patient returns call to provider office, please advise to call [place] at [place], [place], [place], [place] [address] Direct Dial: [phone] [email or web address] Website: [email or web address]

## 2020-03-17 ENCOUNTER — Other Ambulatory Visit: Payer: Self-pay

## 2020-03-17 ENCOUNTER — Telehealth: Payer: Self-pay | Admitting: Family Medicine

## 2020-03-17 DIAGNOSIS — E785 Hyperlipidemia, unspecified: Secondary | ICD-10-CM

## 2020-03-17 MED ORDER — ATORVASTATIN CALCIUM 40 MG PO TABS: ORAL_TABLET | ORAL | 0 refills | Status: DC

## 2020-03-17 NOTE — Telephone Encounter (Signed)
Pt needs refill on

## 2020-03-17 NOTE — Telephone Encounter (Signed)
Pt is completely out of his Atorvastatinn 40mg  to be sent to [facility] on [address]. Pt has an appt on 04/01/2020

## 2020-03-24 NOTE — Chronic Care Management (AMB) (Signed)
  Chronic Care Management   Outreach Note  03/24/2020 Name: Scott Woods MRN: [id number] DOB: 1951-01-06  Scott Woods is a 69 y.o. year old male who is a primary care patient of [name], [place]. I reached out to Scott Woods by phone today in response to a referral sent by Scott Woods [facility] health plan.     A second unsuccessful telephone outreach was attempted today. The patient was referred to the case management team for assistance with care management and care coordination.   Follow Up Plan: A HIPPA compliant phone message was left for the patient providing contact information and requesting a return call.  The care management team will reach out to the patient again over the next 7 days.  If patient returns call to provider office, please advise to call [place] at [place], [place], [place], [place] [address] Direct Dial: [phone] [email or web address] Website: [email or web address]

## 2020-03-30 NOTE — Chronic Care Management (AMB) (Signed)
  Chronic Care Management   Outreach Note  03/30/2020 Name: Scott Woods MRN: [id number] DOB: August 08, 1951  Scott Woods is a 69 y.o. year old male who is a primary care patient of [name], [place]. I reached out to [name] by phone today in response to a referral sent by Mr. Yulian Gosney [facility] health plan.     Third unsuccessful telephone outreach was attempted today. The patient was referred to the case management team for assistance with care management and care coordination. The patient's primary care provider has been notified of our unsuccessful attempts to make or maintain contact with the patient. The care management team is pleased to engage with this patient at any time in the future should he/she be interested in assistance from the care management team.   Follow Up Plan: The care management team is available to follow up with the patient after provider conversation with the patient regarding recommendation for care management engagement and subsequent re-referral to the care management team.   Noreene Larsson, [place], [place], [place] [address] Direct Dial: [phone] [email or web address] Website: [email or web address]

## 2020-04-01 ENCOUNTER — Other Ambulatory Visit: Payer: Self-pay

## 2020-04-01 ENCOUNTER — Encounter: Payer: Self-pay | Admitting: Family Medicine

## 2020-04-01 ENCOUNTER — Ambulatory Visit (INDEPENDENT_AMBULATORY_CARE_PROVIDER_SITE_OTHER): Payer: Medicare HMO | Admitting: Family Medicine

## 2020-04-01 VITALS — BP 136/88 | HR 100 | Temp 98.1°F | Resp 18 | Ht 70.0 in | Wt 195.2 lb

## 2020-04-01 DIAGNOSIS — Z5181 Encounter for therapeutic drug level monitoring: Secondary | ICD-10-CM

## 2020-04-01 DIAGNOSIS — I10 Essential (primary) hypertension: Secondary | ICD-10-CM

## 2020-04-01 DIAGNOSIS — R635 Abnormal weight gain: Secondary | ICD-10-CM

## 2020-04-01 DIAGNOSIS — R809 Proteinuria, unspecified: Secondary | ICD-10-CM | POA: Diagnosis not present

## 2020-04-01 DIAGNOSIS — E1129 Type 2 diabetes mellitus with other diabetic kidney complication: Secondary | ICD-10-CM | POA: Diagnosis not present

## 2020-04-01 DIAGNOSIS — E785 Hyperlipidemia, unspecified: Secondary | ICD-10-CM

## 2020-04-01 DIAGNOSIS — N182 Chronic kidney disease, stage 2 (mild): Secondary | ICD-10-CM | POA: Diagnosis not present

## 2020-04-01 DIAGNOSIS — E663 Overweight: Secondary | ICD-10-CM | POA: Diagnosis not present

## 2020-04-01 DIAGNOSIS — E8881 Metabolic syndrome: Secondary | ICD-10-CM

## 2020-04-01 MED ORDER — LISINOPRIL 10 MG PO TABS: 10.0000 mg | ORAL_TABLET | Freq: Every day | ORAL | 3 refills | Status: DC

## 2020-04-01 MED ORDER — METFORMIN HCL 500 MG PO TABS: ORAL_TABLET | ORAL | 3 refills | Status: DC

## 2020-04-01 MED ORDER — ATORVASTATIN CALCIUM 40 MG PO TABS: ORAL_TABLET | ORAL | 3 refills | Status: DC

## 2020-04-01 NOTE — Patient Instructions (Signed)

## 2020-04-01 NOTE — Progress Notes (Signed)
Name: Scott Woods   MRN: [id number]    DOB: 1951/02/02   Date:04/01/2020       Progress Note  Chief Complaint  Patient presents with  . Follow-up    6 months  . Hypertension  . Hyperlipidemia  . Diabetes     Subjective:   Scott Woods is a 69 y.o. male, presents to clinic for   DM- on metformin 1000 mg BID - not checking sugars, last A1C was 7.1 in Jan, hx of being well controlled  HTN- on 2.5 mg lisinopril, takes daily Pt reports good med compliance and denies any SE.  No lightheadedness, hypotension, syncope. Blood pressure today is fairly well controlled - has been uncontrolled recently with increased weight during COVID pandemic, but recently has been improving BP Readings from Last 3 Encounters:  04/01/20 136/88  01/17/20 (!) 148/84  10/10/19 (!) 144/74   Pt denies CP, SOB, exertional sx, LE edema, palpitation, Ha's, visual disturbances  Hyperlipidemia: Currently treated with Lipitor 40 mg, pt reports good med compliance Last Lipids: Lab Results  Component Value Date   CHOL 140 10/03/2019   HDL 40 10/03/2019   LDLCALC 76 10/03/2019   TRIG 138 10/03/2019   CHOLHDL 3.5 10/03/2019   - Denies: Chest pain, shortness of breath, myalgias, claudication  Patient gained weight at the beginning of Covid but has been gradually losing weight over the past year BMI is in overweight category and from 1 year ago he lost 22 pounds Wt Readings from Last 5 Encounters:  04/01/20 195 lb 3.2 oz (88.5 kg)  01/17/20 198 lb 12.8 oz (90.2 kg)  10/03/19 207 lb 12.8 oz (94.3 kg)  07/16/19 213 lb (96.6 kg)  04/01/19 217 lb 14.4 oz (98.8 kg)   BMI Readings from Last 5 Encounters:  04/01/20 28.01 kg/m  01/17/20 28.52 kg/m  10/03/19 29.82 kg/m  07/16/19 30.56 kg/m  04/01/19 31.27 kg/m      Current Outpatient Medications:  .  aspirin EC 81 MG tablet, Take 1 tablet (81 mg total) by mouth daily., Disp: , Rfl:  .  atorvastatin (LIPITOR) 40 MG tablet, TAKE 1 TABLET BY MOUTH  EVERYDAY AT BEDTIME, Disp: 30 tablet, Rfl: 0 .  finasteride (PROSCAR) 5 MG tablet, TAKE 1 TABLET BY MOUTH EVERY DAY, Disp: 90 tablet, Rfl: 3 .  lisinopril (ZESTRIL) 2.5 MG tablet, TAKE 1 TABLET BY MOUTH EVERY DAY, Disp: 90 tablet, Rfl: 0 .  metFORMIN (GLUCOPHAGE) 500 MG tablet, Take 2 tablets twice daily with meals, Disp: 360 tablet, Rfl: 1  Patient Active Problem List   Diagnosis Date Noted  . Benign prostatic hyperplasia with urinary hesitancy 04/01/2019  . Type 2 diabetes mellitus with microalbuminuria, without long-term current use of insulin ([place]) 11/16/2017  . Adrenal adenoma 05/08/2015  . Chronic kidney disease (CKD), stage II (mild) 05/08/2015  . Abnormal prostate specific antigen 05/08/2015  . Hyperlipidemia LDL goal <100 05/08/2015  . Hypertension, goal below 140/90 05/08/2015  . Overweight (BMI 25.0-29.9) 05/08/2015    Past Surgical History:  Procedure Laterality Date  . ADENOIDECTOMY    . COLONOSCOPY  11/18/2014  . PROCTOSCOPY  04/2014  . TONSILLECTOMY      Family History  Problem Relation Age of Onset  . Parkinson's disease Brother   . Heart disease Mother   . Diabetes Mellitus II Mother   . Coronary artery disease Mother   . Heart disease Father   . Prostate cancer Neg Hx   . Kidney cancer Neg Hx   .  Bladder Cancer Neg Hx     Social History   Tobacco Use  . Smoking status: Never Smoker  . Smokeless tobacco: Never Used  Vaping Use  . Vaping Use: Never used  Substance Use Topics  . Alcohol use: No    Alcohol/week: 0.0 standard drinks  . Drug use: No     No Known Allergies  Health Maintenance  Topic Date Due  . HEMOGLOBIN A1C  04/01/2020  . COVID-19 Vaccine (1) 04/17/2020 (Originally 04/08/1963)  . INFLUENZA VACCINE  04/19/2020  . FOOT EXAM  10/02/2020  . OPHTHALMOLOGY EXAM  12/03/2020  . TETANUS/TDAP  09/19/2022  . COLONOSCOPY  09/19/2024  . Hepatitis C Screening  Completed  . PNA vac Low Risk Adult  Completed    Chart Review Today: I  personally reviewed active problem list, medication list, allergies, family history, social history, health maintenance, notes from last encounter, lab results, imaging with the patient/caregiver today.   Review of Systems  10 Systems reviewed and are negative for acute change except as noted in the HPI.  Objective:   Vitals:   04/01/20 0927  BP: 136/88  Pulse: 100  Resp: 18  Temp: 98.1 F (36.7 C)  TempSrc: Temporal  SpO2: 99%  Weight: 195 lb 3.2 oz (88.5 kg)  Height: 5\' 10"  (1.778 m)    Body mass index is 28.01 kg/m.  Physical Exam Vitals and nursing note reviewed.  Constitutional:      General: He is not in acute distress.    Appearance: Normal appearance. He is well-developed. He is not ill-appearing, toxic-appearing or diaphoretic.     Interventions: Face mask in place.  HENT:     Head: Normocephalic and atraumatic.     Jaw: No trismus.     Right Ear: External ear normal.     Left Ear: External ear normal.  Eyes:     General: Lids are normal. No scleral icterus.    Conjunctiva/sclera: Conjunctivae normal.     Pupils: Pupils are equal, round, and reactive to light.  Neck:     Trachea: Trachea and phonation normal. No tracheal deviation.  Cardiovascular:     Rate and Rhythm: Normal rate and regular rhythm.     Pulses: Normal pulses.          Radial pulses are 2+ on the right side and 2+ on the left side.       Posterior tibial pulses are 2+ on the right side and 2+ on the left side.     Heart sounds: Normal heart sounds. No murmur heard.  No friction rub. No gallop.   Pulmonary:     Effort: Pulmonary effort is normal. No respiratory distress.     Breath sounds: Normal breath sounds. No stridor. No wheezing, rhonchi or rales.  Abdominal:     General: Bowel sounds are normal. There is no distension.     Palpations: Abdomen is soft.     Tenderness: There is no abdominal tenderness. There is no guarding or rebound.  Musculoskeletal:     Cervical back: Normal  range of motion and neck supple.     Right lower leg: No edema.     Left lower leg: No edema.  Skin:    General: Skin is warm and dry.     Coloration: Skin is not jaundiced.     Findings: No rash.     Nails: There is no clubbing.  Neurological:     Mental Status: He is alert.  Cranial Nerves: No dysarthria or facial asymmetry.     Motor: No tremor or abnormal muscle tone.     Gait: Gait normal.  Psychiatric:        Mood and Affect: Mood normal.        Speech: Speech normal.        Behavior: Behavior normal. Behavior is cooperative.         Assessment & Plan:     ICD-10-CM   1. Hyperlipidemia LDL goal <100  E78.5 atorvastatin (LIPITOR) 40 MG tablet   Compliant with statin, no myalgias, due for recheck of labs will check annually if well controlled and no significant weight or diet changes  2. Essential hypertension  Q22 COMPLETE METABOLIC PANEL WITH GFR    lisinopril (ZESTRIL) 10 MG tablet   Blood pressure has been elevated is slightly better today with some weight loss efforts increase lisinopril to protect kidney function and manage hypertension  3. Type 2 diabetes mellitus with microalbuminuria, without long-term current use of insulin (HCC)  E11.29 atorvastatin (LIPITOR) 40 MG tablet   R80.9 metFORMIN (GLUCOPHAGE) 500 MG tablet    Hemoglobin A1C    COMPLETE METABOLIC PANEL WITH GFR    lisinopril (ZESTRIL) 10 MG tablet    Microalbumin, urine    Referral to Nutrition and Diabetes Services   History of well-controlled diabetes last A1c was 7.1, not checking blood sugars but has been working on diet and exercise lost weight, recheck today  4. Chronic kidney disease (CKD), stage II (mild)  W97.9 COMPLETE METABOLIC PANEL WITH GFR    Microalbumin, urine   Monitor renal function, manage with good blood pressure control, avoiding NSAIDs and other nephrotoxic agents, day well-hydrated  5. Overweight (BMI 25.0-29.9)  E66.3 Referral to Nutrition and Diabetes Services  6.  Metabolic syndrome  G92.11 Referral to Nutrition and Diabetes Services   Prior PCP diagnosed with metabolic syndrome is on Metformin and tolerating, weight improving, down 22 lbs   7. Weight gain  R63.5 Referral to Nutrition and Diabetes Services  8. Medication monitoring encounter  Z51.81 Hemoglobin A1C    COMPLETE METABOLIC PANEL WITH GFR     Return in about 6 months (around 10/02/2020) for Routine follow-up DM, HTN, HLD (labs).   [name], PA-C 04/01/20 9:33 AM

## 2020-04-02 LAB — COMPLETE METABOLIC PANEL WITH GFR
AG Ratio: 1.6 (calc) (ref 1.0–2.5)
ALT: 14 U/L (ref 9–46)
AST: 16 U/L (ref 10–35)
Albumin: 4.3 g/dL (ref 3.6–5.1)
Alkaline phosphatase (APISO): 93 U/L (ref 35–144)
BUN/Creatinine Ratio: 19 (calc) (ref 6–22)
BUN: 25 mg/dL (ref 7–25)
CO2: 25 mmol/L (ref 20–32)
Calcium: 9.2 mg/dL (ref 8.6–10.3)
Chloride: 105 mmol/L (ref 98–110)
Creat: 1.35 mg/dL — ABNORMAL HIGH (ref 0.70–1.25)
GFR, Est African American: 63 mL/min/{1.73_m2} (ref >=60)
GFR, Est Non African American: 54 mL/min/{1.73_m2} — ABNORMAL LOW (ref >=60)
Globulin: 2.7 g/dL (calc) (ref 1.9–3.7)
Glucose, Bld: 126 mg/dL — ABNORMAL HIGH (ref 65–99)
Potassium: 4.8 mmol/L (ref 3.5–5.3)
Sodium: 141 mmol/L (ref 135–146)
Total Bilirubin: 0.8 mg/dL (ref 0.2–1.2)
Total Protein: 7 g/dL (ref 6.1–8.1)

## 2020-04-02 LAB — HEMOGLOBIN A1C
Hgb A1c MFr Bld: 6.5 % of total Hgb — ABNORMAL HIGH (ref <5.7)
Mean Plasma Glucose: 140 (calc)
eAG (mmol/L): 7.7 (calc)

## 2020-04-02 LAB — MICROALBUMIN, URINE: Microalb, Ur: 7.6 mg/dL

## 2020-04-08 ENCOUNTER — Other Ambulatory Visit: Payer: Self-pay | Admitting: Family Medicine

## 2020-04-08 DIAGNOSIS — E1129 Type 2 diabetes mellitus with other diabetic kidney complication: Secondary | ICD-10-CM

## 2020-04-08 DIAGNOSIS — E785 Hyperlipidemia, unspecified: Secondary | ICD-10-CM

## 2020-05-01 ENCOUNTER — Other Ambulatory Visit: Payer: Self-pay

## 2020-05-01 DIAGNOSIS — R319 Hematuria, unspecified: Secondary | ICD-10-CM | POA: Diagnosis not present

## 2020-05-01 DIAGNOSIS — N182 Chronic kidney disease, stage 2 (mild): Secondary | ICD-10-CM | POA: Diagnosis not present

## 2020-05-01 DIAGNOSIS — Z7982 Long term (current) use of aspirin: Secondary | ICD-10-CM | POA: Diagnosis not present

## 2020-05-01 DIAGNOSIS — Z79899 Other long term (current) drug therapy: Secondary | ICD-10-CM | POA: Insufficient documentation

## 2020-05-01 DIAGNOSIS — Z7984 Long term (current) use of oral hypoglycemic drugs: Secondary | ICD-10-CM | POA: Diagnosis not present

## 2020-05-01 DIAGNOSIS — E1122 Type 2 diabetes mellitus with diabetic chronic kidney disease: Secondary | ICD-10-CM | POA: Insufficient documentation

## 2020-05-01 DIAGNOSIS — I129 Hypertensive chronic kidney disease with stage 1 through stage 4 chronic kidney disease, or unspecified chronic kidney disease: Secondary | ICD-10-CM | POA: Diagnosis not present

## 2020-05-01 DIAGNOSIS — Z8546 Personal history of malignant neoplasm of prostate: Secondary | ICD-10-CM | POA: Insufficient documentation

## 2020-05-01 LAB — URINALYSIS, COMPLETE (UACMP) WITH MICROSCOPIC
Bacteria, UA: NONE SEEN
RBC / HPF: >50 RBC/hpf — ABNORMAL HIGH (ref 0–5)
Specific Gravity, Urine: 1.031 — ABNORMAL HIGH (ref 1.005–1.030)
Squamous Epithelial / HPF: NONE SEEN (ref 0–5)

## 2020-05-01 LAB — CBC
HCT: 38.8 % — ABNORMAL LOW (ref 39.0–52.0)
Hemoglobin: 13.4 g/dL (ref 13.0–17.0)
MCH: 30.2 pg (ref 26.0–34.0)
MCHC: 34.5 g/dL (ref 30.0–36.0)
MCV: 87.4 fL (ref 80.0–100.0)
Platelets: 231 10*3/uL (ref 150–400)
RBC: 4.44 MIL/uL (ref 4.22–5.81)
RDW: 12.5 % (ref 11.5–15.5)
WBC: 11.8 10*3/uL — ABNORMAL HIGH (ref 4.0–10.5)
nRBC: 0 % (ref 0.0–0.2)

## 2020-05-01 LAB — BASIC METABOLIC PANEL
Anion gap: 11 (ref 5–15)
BUN: 27 mg/dL — ABNORMAL HIGH (ref 8–23)
CO2: 24 mmol/L (ref 22–32)
Calcium: 8.9 mg/dL (ref 8.9–10.3)
Chloride: 102 mmol/L (ref 98–111)
Creatinine, Ser: 1.21 mg/dL (ref 0.61–1.24)
GFR calc Af Amer: >60 mL/min (ref >60)
GFR calc non Af Amer: >60 mL/min (ref >60)
Glucose, Bld: 122 mg/dL — ABNORMAL HIGH (ref 70–99)
Potassium: 4.4 mmol/L (ref 3.5–5.1)
Sodium: 137 mmol/L (ref 135–145)

## 2020-05-01 LAB — GLUCOSE, CAPILLARY: Glucose-Capillary: 150 mg/dL — ABNORMAL HIGH (ref 70–99)

## 2020-05-01 NOTE — ED Triage Notes (Signed)
Patient reports noticed blood in urine today and dysuria.

## 2020-05-02 ENCOUNTER — Emergency Department
Admission: EM | Admit: 2020-05-02 | Discharge: 2020-05-02 | Disposition: A | Discharge status: Home / Self Care | Payer: Medicare HMO | Attending: Emergency Medicine | Admitting: Emergency Medicine

## 2020-05-02 DIAGNOSIS — R319 Hematuria, unspecified: Secondary | ICD-10-CM

## 2020-05-02 LAB — CBC
HCT: 40.6 % (ref 39.0–52.0)
Hemoglobin: 14.2 g/dL (ref 13.0–17.0)
MCH: 30.4 pg (ref 26.0–34.0)
MCHC: 35 g/dL (ref 30.0–36.0)
MCV: 86.9 fL (ref 80.0–100.0)
Platelets: 249 10*3/uL (ref 150–400)
RBC: 4.67 MIL/uL (ref 4.22–5.81)
RDW: 12.8 % (ref 11.5–15.5)
WBC: 12.6 10*3/uL — ABNORMAL HIGH (ref 4.0–10.5)
nRBC: 0 % (ref 0.0–0.2)

## 2020-05-02 LAB — GLUCOSE, CAPILLARY: Glucose-Capillary: 132 mg/dL — ABNORMAL HIGH (ref 70–99)

## 2020-05-02 MED ORDER — CIPROFLOXACIN HCL 500 MG PO TABS: 500.0000 mg | ORAL_TABLET | Freq: Two times a day (BID) | ORAL | 0 refills | Status: AC

## 2020-05-02 NOTE — ED Notes (Signed)
MD at bedside. 

## 2020-05-02 NOTE — ED Provider Notes (Signed)
[facility] Emergency Department Provider Note   ____________________________________________   First MD Initiated Contact with Patient 05/02/20 0914     (approximate)  I have reviewed the triage vital signs and the nursing notes.   HISTORY  Chief Complaint Hematuria    HPI Scott Woods is a 69 y.o. male who reports hematuria.  He had some dysuria last night.  He reports the dysuria is gone now and the urine is much less dark.  He has no belly pain nausea vomiting diarrhea or other complaints.  He reports he was tachycardic because he was upset at the weight and being told he was coming right back when he did not.  Patient reports he had hematuria in the past was treated with Cipro for an infection that went away.  Review of the old record shows that this actually happened twice in 2015 and 2018.     Past Medical History:  Diagnosis Date  . Adrenal nodule ([place])   . Blood in the urine 05/08/2015  . Borderline diabetes   . Chronic kidney disease   . CKD (chronic kidney disease), stage II   . Diabetes mellitus without complication ([place])   . Elevated PSA   . Gross hematuria   . H/O type B viral hepatitis 05/08/2015   When he was a child   . History of hepatitis B   . HTN (hypertension)   . Hyperlipidemia with target LDL less than 100   . Obesity   . Pre-ulcerative calluses 03/28/2016   great toe   . Prostate cancer [facility])     Patient Active Problem List   Diagnosis Date Noted  . Benign prostatic hyperplasia with urinary hesitancy 04/01/2019  . Type 2 diabetes mellitus with microalbuminuria, without long-term current use of insulin ([place]) 11/16/2017  . Adrenal adenoma 05/08/2015  . Chronic kidney disease (CKD), stage II (mild) 05/08/2015  . Abnormal prostate specific antigen 05/08/2015  . Hyperlipidemia LDL goal <100 05/08/2015  . Hypertension, goal below 140/90 05/08/2015  . Overweight (BMI 25.0-29.9) 05/08/2015    Past Surgical History:   Procedure Laterality Date  . ADENOIDECTOMY    . COLONOSCOPY  11/18/2014  . PROCTOSCOPY  04/2014  . TONSILLECTOMY      Prior to Admission medications   Medication Sig Start Date End Date Taking? Authorizing Provider  aspirin EC 81 MG tablet Take 1 tablet (81 mg total) by mouth daily. 04/02/16   Arnetha Courser, MD  atorvastatin (LIPITOR) 40 MG tablet TAKE 1 TABLET BY MOUTH EVERYDAY AT BEDTIME 04/01/20   [name], PA-C  ciprofloxacin (CIPRO) 500 MG tablet Take 1 tablet (500 mg total) by mouth 2 (two) times daily for 10 days. 05/02/20 05/12/20  Nena Polio, MD  finasteride (PROSCAR) 5 MG tablet TAKE 1 TABLET BY MOUTH EVERY DAY 03/10/20   Zara Council A, PA-C  lisinopril (ZESTRIL) 10 MG tablet Take 1 tablet (10 mg total) by mouth daily. 04/01/20   [name], PA-C  metFORMIN (GLUCOPHAGE) 500 MG tablet Take 2 tablets twice daily with meals 04/01/20   [name], PA-C    Allergies Patient has no known allergies.  Family History  Problem Relation Age of Onset  . Parkinson's disease Brother   . Heart disease Mother   . Diabetes Mellitus II Mother   . Coronary artery disease Mother   . Heart disease Father   . Prostate cancer Neg Hx   . Kidney cancer Neg Hx   . Bladder Cancer  Neg Hx     Social History Social History   Tobacco Use  . Smoking status: Never Smoker  . Smokeless tobacco: Never Used  Vaping Use  . Vaping Use: Never used  Substance Use Topics  . Alcohol use: No    Alcohol/week: 0.0 standard drinks  . Drug use: No    Review of Systems  Constitutional: No fever/chills Eyes: No visual changes. ENT: No sore throat. Cardiovascular: Denies chest pain. Respiratory: Denies shortness of breath. Gastrointestinal: No abdominal pain.  No nausea, no vomiting.  No diarrhea.  No constipation. Genitourinary: Negative for dysuria. Musculoskeletal: Negative for back pain. Skin: Negative for rash. Neurological: Negative for headaches, focal weakness    ____________________________________________   PHYSICAL EXAM:  VITAL SIGNS: ED Triage Vitals  Enc Vitals Group     BP 05/01/20 1932 (!) 140/98     Pulse Rate 05/01/20 1932 (!) 102     Resp 05/01/20 1932 18     Temp 05/01/20 1932 97.7 F (36.5 C)     Temp Source 05/01/20 1932 Oral     SpO2 05/01/20 1932 98 %     Weight 05/01/20 1932 192 lb (87.1 kg)     Height --      Head Circumference --      Peak Flow --      Pain Score 05/01/20 1940 0     Pain Loc --      Pain Edu? --      Excl. in [place]? --     Constitutional: Alert and oriented. Well appearing and in no acute distress. Eyes: Conjunctivae are normal. Head: Atraumatic. Nose: No congestion/rhinnorhea. Mouth/Throat: Mucous membranes are moist.  Oropharynx non-erythematous. Neck: No stridor.  Cardiovascular: Normal rate, regular rhythm. Grossly normal heart sounds.  Good peripheral circulation. Respiratory: Normal respiratory effort.  No retractions. Lungs CTAB. Gastrointestinal: Soft and nontender. No distention. No abdominal bruits. No CVA tenderness. Musculoskeletal: No lower extremity tenderness  Neurologic:  Normal speech and language. No gross focal neurologic deficits are appreciated. No gait instability. Skin:  Skin is warm, dry and intact. No rash noted.  ____________________________________________   LABS (all labs ordered are listed, but only abnormal results are displayed)  Labs Reviewed  URINALYSIS, COMPLETE (UACMP) WITH MICROSCOPIC - Abnormal; Notable for the following components:      Result Value   Color, Urine RED (*)    APPearance CLOUDY (*)    Specific Gravity, Urine 1.031 (*)    Glucose, UA   (*)    Value: TEST NOT REPORTED DUE TO COLOR INTERFERENCE OF URINE PIGMENT   Hgb urine dipstick   (*)    Value: TEST NOT REPORTED DUE TO COLOR INTERFERENCE OF URINE PIGMENT   Bilirubin Urine   (*)    Value: TEST NOT REPORTED DUE TO COLOR INTERFERENCE OF URINE PIGMENT   Ketones, ur   (*)    Value: TEST  NOT REPORTED DUE TO COLOR INTERFERENCE OF URINE PIGMENT   Protein, ur   (*)    Value: TEST NOT REPORTED DUE TO COLOR INTERFERENCE OF URINE PIGMENT   Nitrite   (*)    Value: TEST NOT REPORTED DUE TO COLOR INTERFERENCE OF URINE PIGMENT   Leukocytes,Ua   (*)    Value: TEST NOT REPORTED DUE TO COLOR INTERFERENCE OF URINE PIGMENT   RBC / HPF >50 (*)    All other components within normal limits  BASIC METABOLIC PANEL - Abnormal; Notable for the following components:   Glucose, Bld 122 (*)  BUN 27 (*)    All other components within normal limits  CBC - Abnormal; Notable for the following components:   WBC 11.8 (*)    HCT 38.8 (*)    All other components within normal limits  GLUCOSE, CAPILLARY - Abnormal; Notable for the following components:   Glucose-Capillary 150 (*)    All other components within normal limits  GLUCOSE, CAPILLARY - Abnormal; Notable for the following components:   Glucose-Capillary 132 (*)    All other components within normal limits  CBC - Abnormal; Notable for the following components:   WBC 12.6 (*)    All other components within normal limits   ____________________________________________  EKG   ____________________________________________  RADIOLOGY  ED MD interpretation:  Official radiology report(s): No results found.  ____________________________________________   PROCEDURES  Procedure(s) performed (including Critical Care):  Procedures   ____________________________________________   INITIAL IMPRESSION / ASSESSMENT AND PLAN / ED COURSE  Patient's H&H is stable white count is gone up somewhat.  He did have white cells in his urine.  I will give him some Cipro as was done last time.  He tolerated that very well last time.  He will follow up with urology return for any further problems.             ____________________________________________   FINAL CLINICAL IMPRESSION(S) / ED DIAGNOSES  Final diagnoses:  Hematuria,  unspecified type     ED Discharge Orders         Ordered    ciprofloxacin (CIPRO) 500 MG tablet  2 times daily     Discontinue  Reprint     05/02/20 1004           Note:  This document was prepared using Dragon voice recognition software and may include unintentional dictation errors.    Nena Polio, MD 05/02/20 1044

## 2020-05-02 NOTE — ED Notes (Signed)
Repeat VS obtained by this RN. This RN apologized and explained delay to patient at this time.

## 2020-05-02 NOTE — ED Notes (Signed)
Patient up to stat desk inquiring about wait time, update given.

## 2020-05-02 NOTE — Discharge Instructions (Addendum)
Please take the Cipro 1 twice a day.  Please return for any weakness or lightheadedness or if you get very dark urine with clots again or have difficulty urinating.  Please follow-up with your urologist later on this week.  Please also return for fever vomiting or if you have any bad pains in your arms and legs.  Very very rarely the Cipro can cause tendon rupture and this is usually preceded by muscle or tendon pain in the arms and legs.  If you have this please return

## 2020-06-30 ENCOUNTER — Telehealth: Payer: Self-pay | Admitting: Family Medicine

## 2020-06-30 NOTE — Telephone Encounter (Signed)
Copied from [place] [id number]. Topic: Medicare AWV >> Jun 30, 2020 10:34 AM Cher Nakai R wrote: Reason for CRM:   Left message for patient to call back and schedule Medicare Annual Wellness Visit (AWV) in office or virtually.  No hx of AWV-I eligible as if 10/21/2019  Please schedule at anytime with [place].      40 Minutes appointment   Any questions, please call me at [phone]

## 2020-07-14 ENCOUNTER — Other Ambulatory Visit: Payer: Medicare HMO

## 2020-07-14 ENCOUNTER — Other Ambulatory Visit: Payer: Self-pay

## 2020-07-14 DIAGNOSIS — R972 Elevated prostate specific antigen [PSA]: Secondary | ICD-10-CM | POA: Diagnosis not present

## 2020-07-15 LAB — PSA: Prostate Specific Ag, Serum: 4.1 ng/mL — ABNORMAL HIGH (ref 0.0–4.0)

## 2020-07-20 NOTE — Progress Notes (Signed)
07/21/2020 3:32 PM   Scott Woods 04-19-1951 [id number]  Referring provider: Hubbard Hartshorn, [place],  [place] [address]  Chief Complaint  Patient presents with  . Elevated PSA    HPI: 69 yo male with an elevated PSA, high risk hematuria and BPH with LUTS who presents today for 6 month follow-up.  Elevated PSA PSA was found to be 8.6 ng/mL on 10/10/2016 during a routine screening.  Patient was seen by Carlyle Lipa, NP in 2015 for a PSA of 9.5.  He did undergo a biopsy at that time and the biopsy was negative.  He was started on finasteride 5 mg daily in 10/2016.    PSA Trend  9.5 in 2015 - bx negative  8.6 on 10/10/16  9.2 on 11/02/2016 - started on finasteride   5.1 (10.2) on 05/24/2017  5.7 (11.4) on 11/27/2017  4.9 (9.8) on 05/31/2018  5.0 (10.0) on 01/08/2019   4.1 (8.2) on 07/16/2019  4.3 (8.6) on 01/17/2020  4.1 (8.2) in 06/2020   BPH with LUTS His IPSS score today is 3, which is mild lower urinary tract symptomatology.  He is pleased with his quality life due to his urinary symptoms.  His previous I PSS score was 2/1.  His previous PVR was 31 mL.  He has no complaints at this visit.  Patient denies any modifying or aggravating factors.  Patient denies any gross hematuria, dysuria or suprapubic/flank pain.  Patient denies any fevers, chills, nausea or vomiting.   Patient is taking finasteride 5 mg daily.     IPSS    Row Name 07/21/20 0800         International Prostate Symptom Score   How often have you had the sensation of not emptying your bladder? Not at All     How often have you had to urinate less than every two hours? Less than 1 in 5 times     How often have you found you stopped and started again several times when you urinated? Not at All     How often have you found it difficult to postpone urination? Not at All     How often have you had a weak urinary stream? Not at All     How often have you had to strain to start  urination? Less than 1 in 5 times     How many times did you typically get up at night to urinate? 1 Time     Total IPSS Score 3       Quality of Life due to urinary symptoms   If you were to spend the rest of your life with your urinary condition just the way it is now how would you feel about that? Pleased            Score:  1-7 Mild 8-19 Moderate 20-35 Severe  High risk hematuria He also had a hematuria workup for gross hematuria in 2015 with CT Urogram demonstrating a 1.2 cm left adrenal nodule.  Cystoscopy was negative.  Repeated hematuria workup with CTU and cystoscopy noted enlarged prostate Visually obstructive, 6 cm in length, and hypervascular and Mild trabeculation. One small diverticulum noted in posterior wall of bladder in 11/24/2016.   He was seen in the ED on 05/02/2020 for gross hematuria.  No culture was done.  Today's UA is negative for micro heme.   Left adrenal adenoma 1.2 cm adrenal nodule seen on CTU in 04/2014 and  seen again on CTU in 11/2016.  He was referred to endocrinology, but they were unable to get in contact with the patient.   PMH: Past Medical History:  Diagnosis Date  . Adrenal nodule ([place])   . Blood in the urine 05/08/2015  . Borderline diabetes   . Chronic kidney disease   . CKD (chronic kidney disease), stage II   . Diabetes mellitus without complication ([place])   . Elevated PSA   . Gross hematuria   . H/O type B viral hepatitis 05/08/2015   When he was a child   . History of hepatitis B   . HTN (hypertension)   . Hyperlipidemia with target LDL less than 100   . Obesity   . Pre-ulcerative calluses 03/28/2016   great toe   . Prostate cancer [facility])     Surgical History: Past Surgical History:  Procedure Laterality Date  . ADENOIDECTOMY    . COLONOSCOPY  11/18/2014  . PROCTOSCOPY  04/2014  . TONSILLECTOMY      Home Medications:  Allergies as of 07/21/2020   No Known Allergies     Medication List       Accurate as of July 21, 2020 11:59 PM. If you have any questions, ask your nurse or doctor.        aspirin EC 81 MG tablet Take 1 tablet (81 mg total) by mouth daily.   atorvastatin 40 MG tablet Commonly known as: LIPITOR TAKE 1 TABLET BY MOUTH EVERYDAY AT BEDTIME   finasteride 5 MG tablet Commonly known as: PROSCAR TAKE 1 TABLET BY MOUTH EVERY DAY   lisinopril 10 MG tablet Commonly known as: ZESTRIL Take 1 tablet (10 mg total) by mouth daily.   metFORMIN 500 MG tablet Commonly known as: GLUCOPHAGE Take 2 tablets twice daily with meals       Allergies: No Known Allergies  Family History: Family History  Problem Relation Age of Onset  . Parkinson's disease Brother   . Heart disease Mother   . Diabetes Mellitus II Mother   . Coronary artery disease Mother   . Heart disease Father   . Prostate cancer Neg Hx   . Kidney cancer Neg Hx   . Bladder Cancer Neg Hx     Social History:  reports that he has never smoked. He has never used smokeless tobacco. He reports that he does not drink alcohol and does not use drugs.  ROS: For pertinent review of systems please refer to history of present illness  Physical Exam: BP (!) 154/77 (BP Location: Left Arm, Patient Position: Sitting, Cuff Size: Normal)   Pulse 96   Ht 5\' 10"  (1.778 m)   Wt 194 lb (88 kg)   BMI 27.84 kg/m   Constitutional:  Well nourished. Alert and oriented, No acute distress. HEENT:  AT, mask in place  Trachea midline Cardiovascular: No clubbing, cyanosis, or edema. Respiratory: Normal respiratory effort, no increased work of breathing. GU: No CVA tenderness.  No bladder fullness or masses.  Patient with circumcised phallus.  Urethral meatus is patent.  No penile discharge. No penile lesions or rashes. Scrotum without lesions, cysts, rashes and/or edema.  Testicles are located scrotally bilaterally. No masses are appreciated in the testicles. Left and right epididymis are normal. Rectal: Patient with  normal sphincter tone. Anus  and perineum without scarring or rashes. No rectal masses are appreciated. Prostate is approximately 50 grams, no nodules are appreciated. Seminal vesicles could not be palpated.  Skin: No rashes, bruises  or suspicious lesions. Lymph: No inguinal adenopathy. Neurologic: Grossly intact, no focal deficits, moving all 4 extremities. Psychiatric: Normal mood and affect.    Laboratory Data: Urinalysis Component     Latest Ref Rng & Units 07/21/2020  Specific Gravity, UA     1.005 - 1.030 >1.030 (H)  pH, UA     5.0 - 7.5 5.0  Color, UA     Yellow Yellow  Appearance Ur     Clear Hazy (A)  Leukocytes,UA     Negative Negative  Protein,UA     Negative/Trace 1+ (A)  Glucose, UA     Negative Trace (A)  Ketones, UA     Negative Negative  RBC, UA     Negative Trace (A)  Bilirubin, UA     Negative Negative  Urobilinogen, Ur     0.2 - 1.0 mg/dL 0.2  Nitrite, UA     Negative Negative  Microscopic Examination      See below:   Component     Latest Ref Rng & Units 07/21/2020  WBC, UA     0 - 5 /hpf 0-5  RBC     0 - 2 /hpf 0-2  Epithelial Cells (non renal)     0 - 10 /hpf 0-10  Casts     None seen /lpf Present (A)  Cast Type     N/A Hyaline casts  Bacteria, UA     None seen/Few None seen   Lab Results  Component Value Date   WBC 12.6 (H) 05/02/2020   HGB 14.2 05/02/2020   HCT 40.6 05/02/2020   MCV 86.9 05/02/2020   PLT 249 05/02/2020    Lab Results  Component Value Date   CREATININE 1.21 05/01/2020    Lab Results  Component Value Date   HGBA1C 6.5 (H) 04/01/2020       Component Value Date/Time   CHOL 140 10/03/2019 1012   HDL 40 10/03/2019 1012   CHOLHDL 3.5 10/03/2019 1012   VLDL 18 01/20/2017 1530   LDLCALC 76 10/03/2019 1012    Lab Results  Component Value Date   AST 16 04/01/2020   Lab Results  Component Value Date   ALT 14 04/01/2020   I reviewed the labs   Assessment & Plan:    1. Elevated PSA PSA is stable  2. History of  hematuria Hematuria workup in 2015 and 2018 - no worrisome lesions discovered Episode of gross hematuria in 04/2020 UA today is negative for [facility] We discussed repeating the hematuria work up with CTU and cysto, but he deferred.  I advised him that hematuria is often a sign of an urological cancers and I recommended him to undergo a work up to evaluate for possible cancers.  He understands this and is willing to take the risk and he states he feels fine and will let me know if it happens again.   RTC in one year for UA  3. BPH with LUTS IPSS score is 3/1, it is slightly worse- Continue conservative management, avoiding bladder irritants and timed voiding's Continue finasteride 5 mg daily RTC in 6 months for I PSS, PSA and exam   4. Adrenal adenoma Patient has not yet seen endocrinology He does not desire a referral to endocrinology at this time and understands that adrenal adenomas can be metabolically active or be cancerous, but again he feels fine and does not want to pursue this at this time   5. Atypical mole Cleared by dermatology  Return  in about 6 months (around 01/18/2021) for IPSS, PSA and exam.  These notes generated with voice recognition software. I apologize for typographical errors.  Zara Council, PA-C  [facility] [address] [place], [place] [address] [phone]

## 2020-07-21 ENCOUNTER — Encounter: Payer: Self-pay | Admitting: Urology

## 2020-07-21 ENCOUNTER — Other Ambulatory Visit: Payer: Self-pay

## 2020-07-21 ENCOUNTER — Ambulatory Visit (INDEPENDENT_AMBULATORY_CARE_PROVIDER_SITE_OTHER): Payer: Medicare HMO | Admitting: Urology

## 2020-07-21 VITALS — BP 154/77 | HR 96 | Ht 70.0 in | Wt 194.0 lb

## 2020-07-21 DIAGNOSIS — N401 Enlarged prostate with lower urinary tract symptoms: Secondary | ICD-10-CM

## 2020-07-21 DIAGNOSIS — Z87448 Personal history of other diseases of urinary system: Secondary | ICD-10-CM

## 2020-07-21 DIAGNOSIS — R972 Elevated prostate specific antigen [PSA]: Secondary | ICD-10-CM

## 2020-07-22 LAB — URINALYSIS, COMPLETE
Bilirubin, UA: NEGATIVE
Ketones, UA: NEGATIVE
Leukocytes,UA: NEGATIVE
Nitrite, UA: NEGATIVE
Specific Gravity, UA: >1.03 — ABNORMAL HIGH (ref 1.005–1.030)
Urobilinogen, Ur: 0.2 mg/dL (ref 0.2–1.0)
pH, UA: 5 (ref 5.0–7.5)

## 2020-07-22 LAB — MICROSCOPIC EXAMINATION: Bacteria, UA: NONE SEEN

## 2020-08-19 ENCOUNTER — Other Ambulatory Visit: Payer: Self-pay | Admitting: *Deleted

## 2020-08-19 DIAGNOSIS — R972 Elevated prostate specific antigen [PSA]: Secondary | ICD-10-CM

## 2020-09-23 ENCOUNTER — Telehealth: Payer: Self-pay | Admitting: Family Medicine

## 2020-09-23 NOTE — Telephone Encounter (Signed)
Copied from [place] [phone]. Topic: Medicare AWV >> Sep 23, 2020  4:08 PM Cher Nakai R wrote: Reason for CRM:   Left message for patient to call back and schedule Medicare Annual Wellness Visit (AWV) in office.   If not able to come in office, please offer to do virtually.   No hx of AWV eligible as of  10/21/2019 AWV-I  Please schedule at anytime with [facility].      40 Minutes appointment   Any questions, please call me at [phone]

## 2020-10-02 ENCOUNTER — Other Ambulatory Visit: Payer: Self-pay

## 2020-10-02 ENCOUNTER — Ambulatory Visit (INDEPENDENT_AMBULATORY_CARE_PROVIDER_SITE_OTHER): Payer: Medicare (Managed Care) | Admitting: Family Medicine

## 2020-10-02 ENCOUNTER — Encounter: Payer: Self-pay | Admitting: Family Medicine

## 2020-10-02 VITALS — BP 124/76 | HR 98 | Temp 98.1°F | Resp 18 | Ht 70.0 in | Wt 188.6 lb

## 2020-10-02 DIAGNOSIS — R3911 Hesitancy of micturition: Secondary | ICD-10-CM

## 2020-10-02 DIAGNOSIS — F320 Major depressive disorder, single episode, mild: Secondary | ICD-10-CM | POA: Insufficient documentation

## 2020-10-02 DIAGNOSIS — E1129 Type 2 diabetes mellitus with other diabetic kidney complication: Secondary | ICD-10-CM

## 2020-10-02 DIAGNOSIS — I10 Essential (primary) hypertension: Secondary | ICD-10-CM | POA: Diagnosis not present

## 2020-10-02 DIAGNOSIS — Z5181 Encounter for therapeutic drug level monitoring: Secondary | ICD-10-CM

## 2020-10-02 DIAGNOSIS — R972 Elevated prostate specific antigen [PSA]: Secondary | ICD-10-CM

## 2020-10-02 DIAGNOSIS — R21 Rash and other nonspecific skin eruption: Secondary | ICD-10-CM

## 2020-10-02 DIAGNOSIS — R809 Proteinuria, unspecified: Secondary | ICD-10-CM

## 2020-10-02 DIAGNOSIS — E785 Hyperlipidemia, unspecified: Secondary | ICD-10-CM | POA: Diagnosis not present

## 2020-10-02 DIAGNOSIS — N182 Chronic kidney disease, stage 2 (mild): Secondary | ICD-10-CM

## 2020-10-02 DIAGNOSIS — N401 Enlarged prostate with lower urinary tract symptoms: Secondary | ICD-10-CM

## 2020-10-02 MED ORDER — LISINOPRIL 10 MG PO TABS: 10.0000 mg | ORAL_TABLET | Freq: Every day | ORAL | 3 refills | Status: DC

## 2020-10-02 MED ORDER — METFORMIN HCL 500 MG PO TABS
ORAL_TABLET | ORAL | 3 refills | Status: DC
Start: 2020-10-02 — End: 2021-06-03

## 2020-10-02 MED ORDER — ATORVASTATIN CALCIUM 40 MG PO TABS: ORAL_TABLET | ORAL | 3 refills | Status: AC

## 2020-10-02 NOTE — Progress Notes (Signed)
Name: Scott Woods   MRN: [id number]    DOB: 06/25/1951   Date:10/02/2020       Progress Note  Chief Complaint  Patient presents with  . Diabetes  . Hypertension    6 month recheck     Subjective:   Scott Woods is a 70 y.o. male, presents to clinic for routine f/up  BPH - managed by urology- on finasteride - no change to LUTS  Hyperlipidemia: Currently treated with lipitor 10 mg, pt reports good med compliance Last Lipids: Lab Results  Component Value Date   CHOL 140 10/03/2019   HDL 40 10/03/2019   LDLCALC 76 10/03/2019   TRIG 138 10/03/2019   CHOLHDL 3.5 10/03/2019   - Denies: Chest pain, shortness of breath, myalgias, claudication  Hypertension:  Currently managed on lisinopril 10 mg Pt reports good med compliance and denies any SE.   Blood pressure today is sdll controlled. BP Readings from Last 3 Encounters:  10/02/20 124/76  07/21/20 (!) 154/77  05/02/20 119/78   Pt denies CP, SOB, exertional sx, LE edema, palpitation, Ha's, visual disturbances, lightheadedness, hypotension, syncope. Dietary efforts for BP?  none   DM:   Pt managing DM with metformin 1000 mg BID Reports good med compliance Pt has no SE from meds. Blood sugars not checking Denies: Polyuria, polydipsia, vision changes, neuropathy, hypoglycemia Recent pertinent labs: Lab Results  Component Value Date   HGBA1C 6.5 (H) 04/01/2020   HGBA1C 7.1 (H) 10/03/2019   HGBA1C 7.5 (H) 04/01/2019   Standard of care and health maintenance: Foot exam:  Done today DM eye exam:  Due in march ACEI/ARB:  lisinopril Statin:  Yes  Some depression - lives alone, doesn't like to be alone, some issues with family- he didn't like being asked questions about mood/depression - did PHQ2 not 9- some family conflict, relationship conflict Depression screen [facility] 2/9 10/02/2020 04/01/2020 10/03/2019  Decreased Interest 1 0 0  Down, Depressed, Hopeless 0 0 0  PHQ - 2 Score 1 0 0  Altered sleeping - 0 0  Tired,  decreased energy - 0 0  Change in appetite - 0 0  Feeling bad or failure about yourself  - 0 0  Trouble concentrating - 0 0  Moving slowly or fidgety/restless - 0 0  Suicidal thoughts - 0 0  PHQ-9 Score - 0 0  Difficult doing work/chores - - Not difficult at all    He refuses flu shot or COVID vaccine  Rash to legs bilaterally, scratched up  DM foot exam- long toenails, most are thick, he states he gets pedicures every once in a while - hasn't seen podiatrist   Current Outpatient Medications:  .  aspirin EC 81 MG tablet, Take 1 tablet (81 mg total) by mouth daily., Disp: , Rfl:  .  atorvastatin (LIPITOR) 40 MG tablet, TAKE 1 TABLET BY MOUTH EVERYDAY AT BEDTIME, Disp: 90 tablet, Rfl: 3 .  finasteride (PROSCAR) 5 MG tablet, TAKE 1 TABLET BY MOUTH EVERY DAY, Disp: 90 tablet, Rfl: 3 .  lisinopril (ZESTRIL) 10 MG tablet, Take 1 tablet (10 mg total) by mouth daily., Disp: 90 tablet, Rfl: 3 .  metFORMIN (GLUCOPHAGE) 500 MG tablet, Take 2 tablets twice daily with meals, Disp: 180 tablet, Rfl: 3  Patient Active Problem List   Diagnosis Date Noted  . Benign prostatic hyperplasia with urinary hesitancy 04/01/2019  . Type 2 diabetes mellitus with microalbuminuria, without long-term current use of insulin ([place]) 11/16/2017  . Adrenal adenoma  05/08/2015  . Chronic kidney disease (CKD), stage II (mild) 05/08/2015  . Abnormal prostate specific antigen 05/08/2015  . Hyperlipidemia LDL goal <100 05/08/2015  . Hypertension, goal below 140/90 05/08/2015  . Overweight (BMI 25.0-29.9) 05/08/2015    Past Surgical History:  Procedure Laterality Date  . ADENOIDECTOMY    . COLONOSCOPY  11/18/2014  . PROCTOSCOPY  04/2014  . TONSILLECTOMY      Family History  Problem Relation Age of Onset  . Parkinson's disease Brother   . Heart disease Mother   . Diabetes Mellitus II Mother   . Coronary artery disease Mother   . Heart disease Father   . Prostate cancer Neg Hx   . Kidney cancer Neg Hx   .  Bladder Cancer Neg Hx     Social History   Tobacco Use  . Smoking status: Never Smoker  . Smokeless tobacco: Never Used  Vaping Use  . Vaping Use: Never used  Substance Use Topics  . Alcohol use: No    Alcohol/week: 0.0 standard drinks  . Drug use: No     No Known Allergies  Health Maintenance  Topic Date Due  . FOOT EXAM  10/02/2020  . HEMOGLOBIN A1C  10/02/2020  . COVID-19 Vaccine (1) 10/18/2020 (Originally 04/08/1956)  . INFLUENZA VACCINE  12/17/2020 (Originally 04/19/2020)  . OPHTHALMOLOGY EXAM  12/03/2020  . TETANUS/TDAP  09/19/2022  . COLONOSCOPY (Pts 45-10yrs Insurance coverage will need to be confirmed)  09/19/2024  . Hepatitis C Screening  Completed  . PNA vac Low Risk Adult  Completed    Chart Review Today: I personally reviewed active problem list, medication list, allergies, family history, social history, health maintenance, notes from last encounter, lab results, imaging with the patient/caregiver today.   Review of Systems  Constitutional: Negative.   HENT: Negative.   Eyes: Negative.   Respiratory: Negative.   Cardiovascular: Negative.   Gastrointestinal: Negative.   Endocrine: Negative.   Genitourinary: Negative.   Musculoskeletal: Negative.   Skin: Negative.   Allergic/Immunologic: Negative.   Neurological: Negative.   Hematological: Negative.   Psychiatric/Behavioral: Positive for dysphoric mood. Negative for behavioral problems, self-injury, sleep disturbance and suicidal ideas. The patient is not nervous/anxious.   All other systems reviewed and are negative.    Objective:   Vitals:   10/02/20 0858  BP: 124/76  Pulse: 98  Resp: 18  Temp: 98.1 F (36.7 C)  TempSrc: Oral  SpO2: 99%  Weight: 188 lb 9.6 oz (85.5 kg)  Height: 5\' 10"  (1.778 m)    Body mass index is 27.06 kg/m.  Physical Exam Vitals and nursing note reviewed.  Constitutional:      General: He is not in acute distress.    Appearance: Normal appearance. He is  well-developed. He is not ill-appearing, toxic-appearing or diaphoretic.     Interventions: Face mask in place.  HENT:     Head: Normocephalic and atraumatic.     Jaw: No trismus.     Right Ear: External ear normal.     Left Ear: External ear normal.  Eyes:     General: Lids are normal. No scleral icterus.       Right eye: No discharge.        Left eye: No discharge.     Conjunctiva/sclera: Conjunctivae normal.  Neck:     Trachea: Trachea and phonation normal. No tracheal deviation.  Cardiovascular:     Rate and Rhythm: Normal rate and regular rhythm.     Pulses: Normal pulses.  Radial pulses are 2+ on the right side and 2+ on the left side.       Posterior tibial pulses are 2+ on the right side and 2+ on the left side.     Heart sounds: Normal heart sounds. No murmur heard. No friction rub. No gallop.   Pulmonary:     Effort: Pulmonary effort is normal. No respiratory distress.     Breath sounds: Normal breath sounds. No stridor. No wheezing, rhonchi or rales.  Abdominal:     General: Bowel sounds are normal. There is no distension.     Palpations: Abdomen is soft.     Tenderness: There is no abdominal tenderness. There is no guarding.  Musculoskeletal:     Right lower leg: No edema.     Left lower leg: No edema.  Skin:    General: Skin is warm and dry.     Capillary Refill: Capillary refill takes less than 2 seconds.     Coloration: Skin is pale. Skin is not jaundiced.     Findings: Rash present.     Nails: There is no clubbing.     Comments: Anterior LE (shins bilaterally) pale skin diffusely excoriated, scattered scratches/scabbing  Neurological:     Mental Status: He is alert. Mental status is at baseline.     Cranial Nerves: No dysarthria or facial asymmetry.     Motor: No tremor or abnormal muscle tone.     Gait: Gait normal.  Psychiatric:        Mood and Affect: Mood normal.        Speech: Speech normal.        Behavior: Behavior normal. Behavior is  cooperative.      Diabetic Foot Exam - Simple   Simple Foot Form Diabetic Foot exam was performed with the following findings: Yes 10/02/2020  9:33 AM  Visual Inspection Sensation Testing Pulse Check Comments      Assessment & Plan:     ICD-10-CM   1. Essential hypertension  K44 COMPLETE METABOLIC PANEL WITH GFR    lisinopril (ZESTRIL) 10 MG tablet   stable, well controlled, BP at goal today - increased lisinopril dose, 10 mg doing well to manage HTN, check labs, continue diet/DASH efforts  2. Hyperlipidemia LDL goal <100  W10.2 COMPLETE METABOLIC PANEL WITH GFR    Lipid panel    atorvastatin (LIPITOR) 40 MG tablet   Compliant with statin, no myalgias, due for recheck of labs will check annually if well controlled and no significant weight or diet changes  3. Type 2 diabetes mellitus with microalbuminuria, without long-term current use of insulin ([facility])  V25.36 COMPLETE METABOLIC PANEL WITH GFR   R80.9 Hemoglobin A1c    atorvastatin (LIPITOR) 40 MG tablet    lisinopril (ZESTRIL) 10 MG tablet    metFORMIN (GLUCOPHAGE) 500 MG tablet   History of well-controlled diabetes last A1c was at goal not checking blood sugars, compliant with metformin, foot exam done, due for eye exam march  4. Chronic kidney disease (CKD), stage II (mild)  U44.0 COMPLETE METABOLIC PANEL WITH GFR   monitoring, lisinopril was increased over the past year   5. Abnormal prostate specific antigen  R97.20    per urology  6. Benign prostatic hyperplasia with urinary hesitancy  N40.1    R39.11    per urology, appears stable, he denies any progression or worsening of sx, compliant with finasteride  7. Current mild episode of major depressive disorder, unspecified whether recurrent ([place])  F32.0  mood down, pt endorses some depression and loneliness, some relationship and family conflict as well, supportive listening today  8. Medication monitoring encounter  Z51.81 CBC with Differential/Platelet    COMPLETE  METABOLIC PANEL WITH GFR    Lipid panel    Hemoglobin A1c  9. Rash and nonspecific skin eruption  R21    b/l LE, dry thin skin with generalized excoriations - encouraged him to hydrate with vaseline, can use hydrocortisone OTC on intact skin     Return in about 6 months (around 04/01/2021) for Routine follow-up.   [name], PA-C 10/02/20 9:30 AM

## 2020-10-03 LAB — CBC WITH DIFFERENTIAL/PLATELET
Absolute Monocytes: 449 cells/uL (ref 200–950)
Basophils Absolute: 53 cells/uL (ref 0–200)
Basophils Relative: 0.8 %
Eosinophils Absolute: 178 cells/uL (ref 15–500)
Eosinophils Relative: 2.7 %
HCT: 32.8 % — ABNORMAL LOW (ref 38.5–50.0)
Hemoglobin: 11.1 g/dL — ABNORMAL LOW (ref 13.2–17.1)
Lymphs Abs: 1313 cells/uL (ref 850–3900)
MCH: 30.6 pg (ref 27.0–33.0)
MCHC: 33.8 g/dL (ref 32.0–36.0)
MCV: 90.4 fL (ref 80.0–100.0)
MPV: 9.5 fL (ref 7.5–12.5)
Monocytes Relative: 6.8 %
Neutro Abs: 4607 cells/uL (ref 1500–7800)
Neutrophils Relative %: 69.8 %
Platelets: 229 10*3/uL (ref 140–400)
RBC: 3.63 10*6/uL — ABNORMAL LOW (ref 4.20–5.80)
RDW: 12.4 % (ref 11.0–15.0)
Total Lymphocyte: 19.9 %
WBC: 6.6 10*3/uL (ref 3.8–10.8)

## 2020-10-03 LAB — LIPID PANEL
Cholesterol: 123 mg/dL (ref <200)
HDL: 41 mg/dL (ref >=40)
LDL Cholesterol (Calc): 61 mg/dL (calc)
Non-HDL Cholesterol (Calc): 82 mg/dL (calc) (ref <130)
Total CHOL/HDL Ratio: 3 (calc) (ref <5.0)
Triglycerides: 125 mg/dL (ref <150)

## 2020-10-03 LAB — COMPLETE METABOLIC PANEL WITH GFR
AG Ratio: 1.6 (calc) (ref 1.0–2.5)
ALT: 14 U/L (ref 9–46)
AST: 14 U/L (ref 10–35)
Albumin: 4.1 g/dL (ref 3.6–5.1)
Alkaline phosphatase (APISO): 105 U/L (ref 35–144)
BUN/Creatinine Ratio: 20 (calc) (ref 6–22)
BUN: 30 mg/dL — ABNORMAL HIGH (ref 7–25)
CO2: 25 mmol/L (ref 20–32)
Calcium: 9 mg/dL (ref 8.6–10.3)
Chloride: 107 mmol/L (ref 98–110)
Creat: 1.48 mg/dL — ABNORMAL HIGH (ref 0.70–1.25)
GFR, Est African American: 56 mL/min/{1.73_m2} — ABNORMAL LOW (ref >=60)
GFR, Est Non African American: 48 mL/min/{1.73_m2} — ABNORMAL LOW (ref >=60)
Globulin: 2.6 g/dL (calc) (ref 1.9–3.7)
Glucose, Bld: 127 mg/dL — ABNORMAL HIGH (ref 65–99)
Potassium: 4.9 mmol/L (ref 3.5–5.3)
Sodium: 140 mmol/L (ref 135–146)
Total Bilirubin: 0.6 mg/dL (ref 0.2–1.2)
Total Protein: 6.7 g/dL (ref 6.1–8.1)

## 2020-10-03 LAB — HEMOGLOBIN A1C
Hgb A1c MFr Bld: 6.9 % of total Hgb — ABNORMAL HIGH (ref <5.7)
Mean Plasma Glucose: 151 mg/dL
eAG (mmol/L): 8.4 mmol/L

## 2020-11-09 ENCOUNTER — Telehealth: Payer: Self-pay | Admitting: Family Medicine

## 2020-11-09 NOTE — Telephone Encounter (Signed)
Copied from [place] [id number]. Topic: Medicare AWV >> Nov 09, 2020 10:12 AM Cher Nakai R wrote: Reason for CRM:  Left message for patient to call back and schedule Medicare Annual Wellness Visit (AWV) in office.   If unable to come into the office for AWV,  please offer to do virtually or by telephone.  No hx of AWV eligible as of 10/21/2019 awvi  Please schedule at anytime with [facility].      40 Minutes appointment   Any questions, please call me at [phone]

## 2021-01-18 ENCOUNTER — Other Ambulatory Visit: Payer: Self-pay

## 2021-01-18 ENCOUNTER — Other Ambulatory Visit: Payer: Medicare (Managed Care)

## 2021-01-18 DIAGNOSIS — R972 Elevated prostate specific antigen [PSA]: Secondary | ICD-10-CM

## 2021-01-18 NOTE — Progress Notes (Signed)
01/19/2021 9:37 AM   [name] 1951/05/29 [id number]  Referring provider: [facility], [address] [place],  [place] [address]  Chief Complaint  Patient presents with  . Elevated PSA   Urological history: 1. Elevated PSA -PSA trend  9.5 in 2015 - bx negative  8.6 on 10/10/16  9.2 on 11/02/2016 - started on finasteride   5.1 (10.2) on 05/24/2017  5.7 (11.4) on 11/27/2017  4.9 (9.8) on 05/31/2018  5.0 (10.0) on 01/08/2019   4.1 (8.2) on 07/16/2019  4.3 (8.6) on 01/17/2020  4.1 (8.2) in 06/2020   6.0 (12.) in 12/2020  2. High risk hematuria -non-smoker -CTU 2018 Stable left adrenal gland nodule measuring less than 10 Hounsfield units and consistent with a benign adenoma.  The right adrenal gland is normal.  No renal, ureteral or bladder calculi.   After contrast administration both kidneys demonstrate normal enhancement/ perfusion. No worrisome renal lesions. The delayed images do not demonstrate any significant collecting system abnormalities. Both ureters are normal.  Mild diffuse bladder wall thickening, trabeculation and cellules suggesting partial bladder outlet obstruction. Prostate gland is enlarged with median lobe hypertrophy impressing on the base of bladder. No bladder mass.  Enlarged prostate gland. The seminal vesicles are normal. -cysto 2018 Enlarged prostate Visually obstructive, 6 cm in length, and hypervascular.  Mild trabeculation. One small diverticulum noted in posterior wall of bladder  3. BPH with LU TS -I PSS 3/1 -managed with finasteride 5 mg daily  4. Adrenal adenoma -has been referred to endocrinology several times, but patient has not scheduled an appointment  HPI: Scott Woods is a 70 y.o. male who presents today for a 6 months follow up.  He has had a 30 lbs weight loss since last year which he attributes to not having any teeth.  He states he scheduled for an appointment for a consultation and  measurements for dentures.  He has no urinary complaints at this visit.  Patient denies any modifying or aggravating factors.  Patient denies any gross hematuria, dysuria or suprapubic/flank pain.  Patient denies any fevers, chills, nausea or vomiting.   He brought up his episode of gross hematuria that he had in 04/2020.      IPSS    Row Name 01/19/21 0900         International Prostate Symptom Score   How often have you had the sensation of not emptying your bladder? Less than 1 in 5     How often have you had to urinate less than every two hours? Less than 1 in 5 times     How often have you found you stopped and started again several times when you urinated? Not at All     How often have you found it difficult to postpone urination? Not at All     How often have you had a weak urinary stream? Not at All     How often have you had to strain to start urination? Less than 1 in 5 times     How many times did you typically get up at night to urinate? None     Total IPSS Score 3           Quality of Life due to urinary symptoms   If you were to spend the rest of your life with your urinary condition just the way it is now how would you feel about that? Pleased  Score:  1-7 Mild 8-19 Moderate 20-35 Severe   PMH: Past Medical History:  Diagnosis Date  . Adrenal nodule ([place])   . Blood in the urine 05/08/2015  . Borderline diabetes   . Chronic kidney disease   . CKD (chronic kidney disease), stage II   . Diabetes mellitus without complication ([place])   . Elevated PSA   . Gross hematuria   . H/O type B viral hepatitis 05/08/2015   When he was a child   . History of hepatitis B   . HTN (hypertension)   . Hyperlipidemia with target LDL less than 100   . Obesity   . Pre-ulcerative calluses 03/28/2016   great toe   . Prostate cancer [facility])     Surgical History: Past Surgical History:  Procedure Laterality Date  . ADENOIDECTOMY    . COLONOSCOPY  11/18/2014  .  PROCTOSCOPY  04/2014  . TONSILLECTOMY      Home Medications:  Allergies as of 01/19/2021   No Known Allergies     Medication List       Accurate as of Jan 19, 2021  9:37 AM. If you have any questions, ask your nurse or doctor.        aspirin EC 81 MG tablet Take 1 tablet (81 mg total) by mouth daily.   atorvastatin 40 MG tablet Commonly known as: LIPITOR TAKE 1 TABLET BY MOUTH EVERYDAY AT BEDTIME   finasteride 5 MG tablet Commonly known as: PROSCAR TAKE 1 TABLET BY MOUTH EVERY DAY   lisinopril 10 MG tablet Commonly known as: ZESTRIL Take 1 tablet (10 mg total) by mouth daily.   metFORMIN 500 MG tablet Commonly known as: GLUCOPHAGE Take 2 tablets twice daily with meals       Allergies: No Known Allergies  Family History: Family History  Problem Relation Age of Onset  . Parkinson's disease Brother   . Heart disease Mother   . Diabetes Mellitus II Mother   . Coronary artery disease Mother   . Heart disease Father   . Prostate cancer Neg Hx   . Kidney cancer Neg Hx   . Bladder Cancer Neg Hx     Social History:  reports that he has never smoked. He has never used smokeless tobacco. He reports that he does not drink alcohol and does not use drugs.  ROS: For pertinent review of systems please refer to history of present illness  Physical Exam: BP 117/73   Pulse (!) 120   Ht 5\' 10"  (1.778 m)   Wt 164 lb 12.8 oz (74.8 kg)   BMI 23.65 kg/m   Constitutional:  Well nourished. Alert and oriented, No acute distress. HEENT: [place] AT, mask in place.  Trachea midline Cardiovascular: No clubbing, cyanosis, or edema. Respiratory: Normal respiratory effort, no increased work of breathing. GU: No CVA tenderness.  No bladder fullness or masses.  Patient with circumcised phallus.  Urethral meatus is patent.  No penile discharge. No penile lesions or rashes. Scrotum without lesions, cysts, rashes and/or edema.  Testicles are located scrotally bilaterally. No masses are  appreciated in the testicles. Left and right epididymis are normal. Rectal: Patient with  normal sphincter tone. Anus and perineum without scarring or rashes. No rectal masses are appreciated. Prostate is approximately 50 grams, no nodules are appreciated. Seminal vesicles could not be palpated Lymph: No inguinal adenopathy. Neurologic: Grossly intact, no focal deficits, moving all 4 extremities. Psychiatric: Normal mood and affect.   Laboratory Data: Lab Results  Component  Value Date   WBC 6.6 10/02/2020   HGB 11.1 (L) 10/02/2020   HCT 32.8 (L) 10/02/2020   MCV 90.4 10/02/2020   PLT 229 10/02/2020    Lab Results  Component Value Date   CREATININE 1.48 (H) 10/02/2020    Lab Results  Component Value Date   HGBA1C 6.9 (H) 10/02/2020       Component Value Date/Time   CHOL 123 10/02/2020 0954   HDL 41 10/02/2020 0954   CHOLHDL 3.0 10/02/2020 0954   VLDL 18 01/20/2017 1530   LDLCALC 61 10/02/2020 0954    Lab Results  Component Value Date   AST 14 10/02/2020   Lab Results  Component Value Date   ALT 14 10/02/2020  I reviewed the labs   Assessment & Plan:    1. Elevated PSA -Discussed with the patient that PSA is an acronym for  prostate specific antigen,  which is a protein made by the prostate gland and can be detected in the blood stream. I explained to the patient situations that would increase the PSA, such as: a man's age,  BPH, infection, recent intercourse/ejaculation, prostate infarction, recent urethroscopic manipulation (Foley placement/cystoscopy) and prostate cancer.   He currently has an elevated PSA of 6.0 (12).   We had a discussion concerning benign and malignant causes of elevated PSA as well as risk factors of prostate cancer including age, race, PSA level, family history and abnormal prostate exam.  We reviewed the [facility] prostate cancer risk calculator results that showed the following: 69% chance of having a negative biopsy, 15% change of  finding high grade prostate cancer and 16% chance of finding low grade prostate cancer.  We discussed options today including biomarker testing, prostate MRI and prostate biopsy. We discussed the cost of biomarker testing as well as prostate MRI. I explained that none of these can make a diagnosis of prostate cancer, but can assess the risk of high grade prostate cancer. I explained prostate MRIs look for high-grade lesions but can miss low-grade prostate cancer. The literature is also quoting about a 20% chance of false negative mp-MRI of the prostate. I also discussed prostate biopsy in great detail including potential complications of this procedure, including pain, bleeding, and infection, as well as the expected minor complications of hematuria, hematochezia, and hematospermia. We also dicussed that we could continue to monitor his PSA without any other diagnostic work-up which could potentially delay a prostate cancer diagnosis -he would like to proceed with a prostate biopsy at this time  2. History of hematuria -Hematuria workup in 2015 and 2018 - no worrisome lesions discovered -episode of gross hematuria in 04/2020-was encouraged to repeat hematuria work up but declined -he still declines today - he was more concerned and upset regarding his 30 hour wait in the waiting room that day   3. BPH with LUTS -symptoms stable -continue conservative management, avoiding bladder irritants and timed voiding's -continue finasteride 5 mg daily  4. Adrenal adenoma -Patient has not yet seen endocrinology -discussed that the weight loss may also be the results of metabolically active adrenal adenomas and encouraged him to see endocrinology -he still declines     Return for TRUSPBX of prostate with Dr. Erlene Quan .  These notes generated with voice recognition software. I apologize for typographical errors.  Zara Council, PA-C  [facility] [address] [place], [place] [address] [phone]  I spent 30 minutes on the day of the encounter to include  pre-visit record review, face-to-face time with the patient, and post-visit ordering of tests.

## 2021-01-19 ENCOUNTER — Ambulatory Visit (INDEPENDENT_AMBULATORY_CARE_PROVIDER_SITE_OTHER): Payer: Medicare (Managed Care) | Admitting: Urology

## 2021-01-19 ENCOUNTER — Encounter: Payer: Self-pay | Admitting: Urology

## 2021-01-19 VITALS — BP 117/73 | HR 120 | Ht 70.0 in | Wt 164.8 lb

## 2021-01-19 DIAGNOSIS — N401 Enlarged prostate with lower urinary tract symptoms: Secondary | ICD-10-CM | POA: Diagnosis not present

## 2021-01-19 DIAGNOSIS — R972 Elevated prostate specific antigen [PSA]: Secondary | ICD-10-CM

## 2021-01-19 LAB — PSA: Prostate Specific Ag, Serum: 6 ng/mL — ABNORMAL HIGH (ref 0.0–4.0)

## 2021-01-19 NOTE — Patient Instructions (Signed)

## 2021-02-02 ENCOUNTER — Telehealth: Payer: Self-pay | Admitting: Family Medicine

## 2021-02-02 ENCOUNTER — Telehealth: Payer: Self-pay

## 2021-02-02 NOTE — Telephone Encounter (Signed)
Please check for fax, also needs appt surgery on 6/2

## 2021-02-02 NOTE — Telephone Encounter (Signed)
Fax received and is upfront Copied from [place] [phone]. Topic: General - Other >> Feb 02, 2021  9:51 AM Leward Quan A wrote: Reason for CRM: Alise with [facility] called in to inquire of [name] about a form for surgical clearance faxed over on 01/19/21 will fax again today. Patient is scheduled for his procedure on 02/18/21. Please advise can reach Alise at Ph# [phone]

## 2021-02-02 NOTE — Telephone Encounter (Signed)
Appt made. Waiting on fax

## 2021-02-02 NOTE — Telephone Encounter (Signed)
Received call from [facility] at [facility] to follow up on request for prostate biopsy; request intended for Threasa Alpha, former NP. Alise will try to find the current phone number for Lattie Haw and resend the request.

## 2021-02-11 ENCOUNTER — Encounter: Payer: Self-pay | Admitting: Unknown Physician Specialty

## 2021-02-11 ENCOUNTER — Inpatient Hospital Stay
Admission: EM | Admit: 2021-02-11 | Discharge: 2021-02-19 | DRG: 725 | Disposition: A | Discharge status: Home Health | Payer: Medicare (Managed Care) | Attending: Internal Medicine | Admitting: Internal Medicine

## 2021-02-11 ENCOUNTER — Encounter: Payer: Self-pay | Admitting: Emergency Medicine

## 2021-02-11 ENCOUNTER — Other Ambulatory Visit: Payer: Self-pay

## 2021-02-11 ENCOUNTER — Ambulatory Visit (INDEPENDENT_AMBULATORY_CARE_PROVIDER_SITE_OTHER): Payer: Medicare (Managed Care) | Admitting: Unknown Physician Specialty

## 2021-02-11 ENCOUNTER — Emergency Department: Payer: Medicare (Managed Care)

## 2021-02-11 ENCOUNTER — Other Ambulatory Visit
Admission: RE | Admit: 2021-02-11 | Discharge: 2021-02-11 | Disposition: A | Discharge status: Home / Self Care | Payer: Medicare (Managed Care) | Source: Ambulatory Visit | Attending: Unknown Physician Specialty | Admitting: Unknown Physician Specialty

## 2021-02-11 VITALS — BP 120/76 | HR 72 | Temp 97.9°F | Resp 16 | Ht 70.0 in | Wt 155.1 lb

## 2021-02-11 DIAGNOSIS — N182 Chronic kidney disease, stage 2 (mild): Secondary | ICD-10-CM

## 2021-02-11 DIAGNOSIS — E785 Hyperlipidemia, unspecified: Secondary | ICD-10-CM | POA: Insufficient documentation

## 2021-02-11 DIAGNOSIS — D649 Anemia, unspecified: Secondary | ICD-10-CM | POA: Insufficient documentation

## 2021-02-11 DIAGNOSIS — E119 Type 2 diabetes mellitus without complications: Secondary | ICD-10-CM | POA: Diagnosis not present

## 2021-02-11 DIAGNOSIS — D631 Anemia in chronic kidney disease: Secondary | ICD-10-CM | POA: Diagnosis present

## 2021-02-11 DIAGNOSIS — N179 Acute kidney failure, unspecified: Secondary | ICD-10-CM | POA: Diagnosis present

## 2021-02-11 DIAGNOSIS — I2510 Atherosclerotic heart disease of native coronary artery without angina pectoris: Secondary | ICD-10-CM | POA: Diagnosis present

## 2021-02-11 DIAGNOSIS — I10 Essential (primary) hypertension: Secondary | ICD-10-CM | POA: Diagnosis not present

## 2021-02-11 DIAGNOSIS — E876 Hypokalemia: Secondary | ICD-10-CM | POA: Diagnosis present

## 2021-02-11 DIAGNOSIS — N2581 Secondary hyperparathyroidism of renal origin: Secondary | ICD-10-CM | POA: Diagnosis present

## 2021-02-11 DIAGNOSIS — Z8546 Personal history of malignant neoplasm of prostate: Secondary | ICD-10-CM | POA: Diagnosis not present

## 2021-02-11 DIAGNOSIS — N320 Bladder-neck obstruction: Secondary | ICD-10-CM | POA: Diagnosis present

## 2021-02-11 DIAGNOSIS — E669 Obesity, unspecified: Secondary | ICD-10-CM | POA: Diagnosis present

## 2021-02-11 DIAGNOSIS — E860 Dehydration: Secondary | ICD-10-CM | POA: Diagnosis present

## 2021-02-11 DIAGNOSIS — Z01810 Encounter for preprocedural cardiovascular examination: Secondary | ICD-10-CM | POA: Diagnosis not present

## 2021-02-11 DIAGNOSIS — E1122 Type 2 diabetes mellitus with diabetic chronic kidney disease: Secondary | ICD-10-CM | POA: Diagnosis present

## 2021-02-11 DIAGNOSIS — E1151 Type 2 diabetes mellitus with diabetic peripheral angiopathy without gangrene: Secondary | ICD-10-CM | POA: Diagnosis present

## 2021-02-11 DIAGNOSIS — N401 Enlarged prostate with lower urinary tract symptoms: Secondary | ICD-10-CM | POA: Diagnosis not present

## 2021-02-11 DIAGNOSIS — Z833 Family history of diabetes mellitus: Secondary | ICD-10-CM

## 2021-02-11 DIAGNOSIS — R634 Abnormal weight loss: Secondary | ICD-10-CM | POA: Diagnosis present

## 2021-02-11 DIAGNOSIS — R9389 Abnormal findings on diagnostic imaging of other specified body structures: Secondary | ICD-10-CM

## 2021-02-11 DIAGNOSIS — I129 Hypertensive chronic kidney disease with stage 1 through stage 4 chronic kidney disease, or unspecified chronic kidney disease: Secondary | ICD-10-CM | POA: Diagnosis present

## 2021-02-11 DIAGNOSIS — N138 Other obstructive and reflux uropathy: Secondary | ICD-10-CM | POA: Diagnosis present

## 2021-02-11 DIAGNOSIS — R531 Weakness: Secondary | ICD-10-CM

## 2021-02-11 DIAGNOSIS — E1129 Type 2 diabetes mellitus with other diabetic kidney complication: Secondary | ICD-10-CM | POA: Insufficient documentation

## 2021-02-11 DIAGNOSIS — R809 Proteinuria, unspecified: Secondary | ICD-10-CM

## 2021-02-11 DIAGNOSIS — R338 Other retention of urine: Secondary | ICD-10-CM | POA: Diagnosis present

## 2021-02-11 DIAGNOSIS — Z79899 Other long term (current) drug therapy: Secondary | ICD-10-CM

## 2021-02-11 DIAGNOSIS — E872 Acidosis: Secondary | ICD-10-CM | POA: Diagnosis present

## 2021-02-11 DIAGNOSIS — I959 Hypotension, unspecified: Secondary | ICD-10-CM | POA: Diagnosis present

## 2021-02-11 DIAGNOSIS — U071 COVID-19: Secondary | ICD-10-CM | POA: Diagnosis present

## 2021-02-11 DIAGNOSIS — Z6821 Body mass index (BMI) 21.0-21.9, adult: Secondary | ICD-10-CM

## 2021-02-11 DIAGNOSIS — D3502 Benign neoplasm of left adrenal gland: Secondary | ICD-10-CM | POA: Diagnosis present

## 2021-02-11 DIAGNOSIS — R339 Retention of urine, unspecified: Secondary | ICD-10-CM | POA: Diagnosis not present

## 2021-02-11 DIAGNOSIS — Z8249 Family history of ischemic heart disease and other diseases of the circulatory system: Secondary | ICD-10-CM

## 2021-02-11 DIAGNOSIS — E8729 Other acidosis: Secondary | ICD-10-CM

## 2021-02-11 DIAGNOSIS — N323 Diverticulum of bladder: Secondary | ICD-10-CM | POA: Diagnosis present

## 2021-02-11 DIAGNOSIS — Z7982 Long term (current) use of aspirin: Secondary | ICD-10-CM

## 2021-02-11 DIAGNOSIS — N189 Chronic kidney disease, unspecified: Secondary | ICD-10-CM

## 2021-02-11 DIAGNOSIS — Z7984 Long term (current) use of oral hypoglycemic drugs: Secondary | ICD-10-CM

## 2021-02-11 LAB — URINALYSIS, COMPLETE (UACMP) WITH MICROSCOPIC
Bacteria, UA: NONE SEEN
Bilirubin Urine: NEGATIVE
Glucose, UA: 50 mg/dL — AB
Hgb urine dipstick: NEGATIVE
Ketones, ur: NEGATIVE mg/dL
Leukocytes,Ua: NEGATIVE
Nitrite: NEGATIVE
Protein, ur: NEGATIVE mg/dL
Specific Gravity, Urine: 1.012 (ref 1.005–1.030)
Squamous Epithelial / HPF: NONE SEEN (ref 0–5)
pH: 5 (ref 5.0–8.0)

## 2021-02-11 LAB — COMPREHENSIVE METABOLIC PANEL
ALT: 10 U/L (ref 0–44)
AST: 13 U/L — ABNORMAL LOW (ref 15–41)
Albumin: 3.5 g/dL (ref 3.5–5.0)
Alkaline Phosphatase: 62 U/L (ref 38–126)
Anion gap: 18 — ABNORMAL HIGH (ref 5–15)
BUN: 112 mg/dL — ABNORMAL HIGH (ref 8–23)
CO2: 11 mmol/L — ABNORMAL LOW (ref 22–32)
Calcium: 8.8 mg/dL — ABNORMAL LOW (ref 8.9–10.3)
Chloride: 105 mmol/L (ref 98–111)
Creatinine, Ser: 6.95 mg/dL — ABNORMAL HIGH (ref 0.61–1.24)
GFR, Estimated: 8 mL/min — ABNORMAL LOW (ref >60)
Glucose, Bld: 120 mg/dL — ABNORMAL HIGH (ref 70–99)
Potassium: 4.7 mmol/L (ref 3.5–5.1)
Sodium: 134 mmol/L — ABNORMAL LOW (ref 135–145)
Total Bilirubin: 1.2 mg/dL (ref 0.3–1.2)
Total Protein: 7.7 g/dL (ref 6.5–8.1)

## 2021-02-11 LAB — LIPID PANEL
Cholesterol: 136 mg/dL (ref 0–200)
HDL: 38 mg/dL — ABNORMAL LOW (ref >40)
LDL Cholesterol: 63 mg/dL (ref 0–99)
Total CHOL/HDL Ratio: 3.6 RATIO
Triglycerides: 177 mg/dL — ABNORMAL HIGH (ref <150)
VLDL: 35 mg/dL (ref 0–40)

## 2021-02-11 LAB — CBC WITH DIFFERENTIAL/PLATELET
Abs Immature Granulocytes: 0.05 10*3/uL (ref 0.00–0.07)
Basophils Absolute: 0 10*3/uL (ref 0.0–0.1)
Basophils Relative: 0 %
Eosinophils Absolute: 0 10*3/uL (ref 0.0–0.5)
Eosinophils Relative: 0 %
HCT: 32 % — ABNORMAL LOW (ref 39.0–52.0)
Hemoglobin: 10.9 g/dL — ABNORMAL LOW (ref 13.0–17.0)
Immature Granulocytes: 1 %
Lymphocytes Relative: 9 %
Lymphs Abs: 0.9 10*3/uL (ref 0.7–4.0)
MCH: 30.2 pg (ref 26.0–34.0)
MCHC: 34.1 g/dL (ref 30.0–36.0)
MCV: 88.6 fL (ref 80.0–100.0)
Monocytes Absolute: 0.6 10*3/uL (ref 0.1–1.0)
Monocytes Relative: 6 %
Neutro Abs: 8.3 10*3/uL — ABNORMAL HIGH (ref 1.7–7.7)
Neutrophils Relative %: 84 %
Platelets: 241 10*3/uL (ref 150–400)
RBC: 3.61 MIL/uL — ABNORMAL LOW (ref 4.22–5.81)
RDW: 13.1 % (ref 11.5–15.5)
WBC: 9.9 10*3/uL (ref 4.0–10.5)
nRBC: 0 % (ref 0.0–0.2)

## 2021-02-11 LAB — BASIC METABOLIC PANEL
Anion gap: 15 (ref 5–15)
BUN: 108 mg/dL — ABNORMAL HIGH (ref 8–23)
CO2: 15 mmol/L — ABNORMAL LOW (ref 22–32)
Calcium: 9.1 mg/dL (ref 8.9–10.3)
Chloride: 104 mmol/L (ref 98–111)
Creatinine, Ser: 6.8 mg/dL — ABNORMAL HIGH (ref 0.61–1.24)
GFR, Estimated: 8 mL/min — ABNORMAL LOW (ref >60)
Glucose, Bld: 138 mg/dL — ABNORMAL HIGH (ref 70–99)
Potassium: 4.9 mmol/L (ref 3.5–5.1)
Sodium: 134 mmol/L — ABNORMAL LOW (ref 135–145)

## 2021-02-11 LAB — MAGNESIUM: Magnesium: 1.9 mg/dL (ref 1.7–2.4)

## 2021-02-11 LAB — POCT GLYCOSYLATED HEMOGLOBIN (HGB A1C): Hemoglobin A1C: 6.4 % — AB (ref 4.0–5.6)

## 2021-02-11 LAB — TSH: TSH: 1.815 u[IU]/mL (ref 0.350–4.500)

## 2021-02-11 IMAGING — CT CT RENAL STONE PROTOCOL
2 of 4 series · 16 of 46 positions shown, 18 images · non-contrast
Comparison: [DATE]

CLINICAL DATA: Hematuria.

EXAM:
CT ABDOMEN AND PELVIS WITHOUT CONTRAST
TECHNIQUE: Multidetector CT imaging of the abdomen and pelvis was performed
following the standard protocol without IV contrast.

[Series 2: stone full standard · axial · 0.79mm/px · z∈[-382,+32]mm · 13 of 91 slices shown, 15 images]
[im 4/91  soft-tissue]
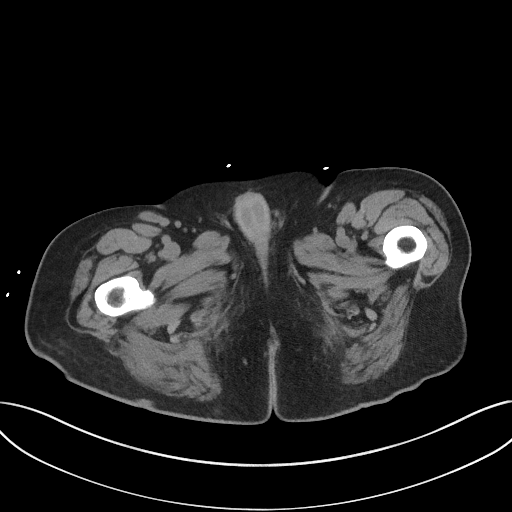
[im 4/91  bone]
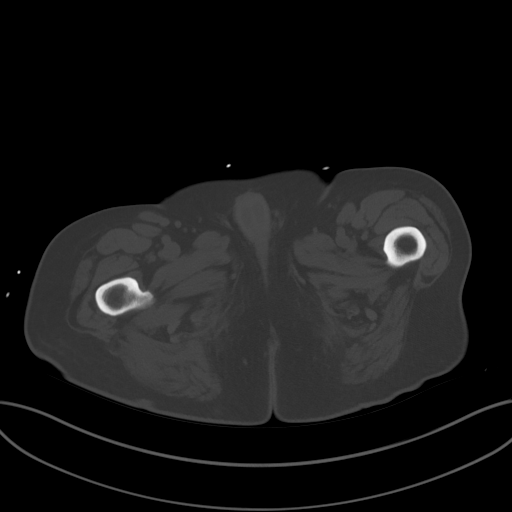
[im 12/91  soft-tissue]
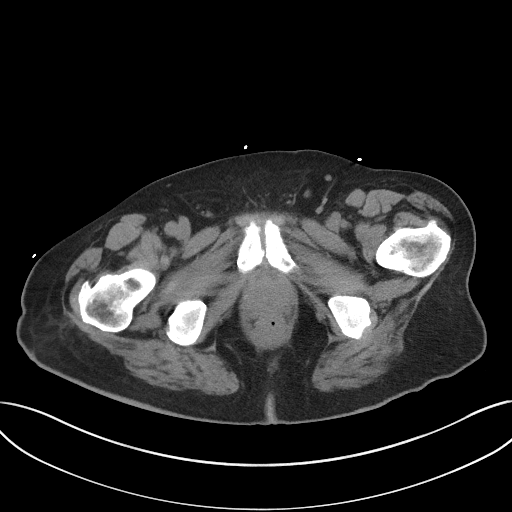
[im 19/91  soft-tissue]
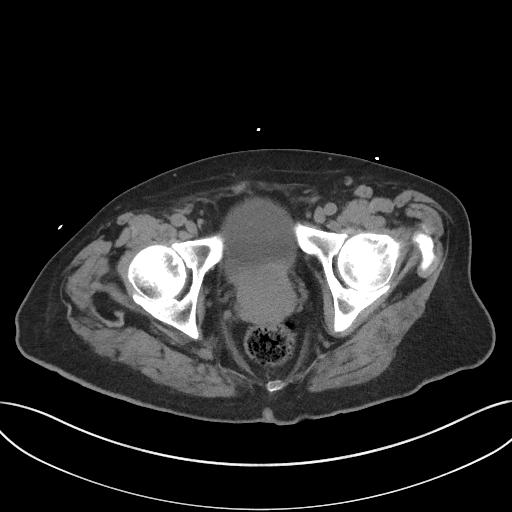
[im 27/91  soft-tissue]
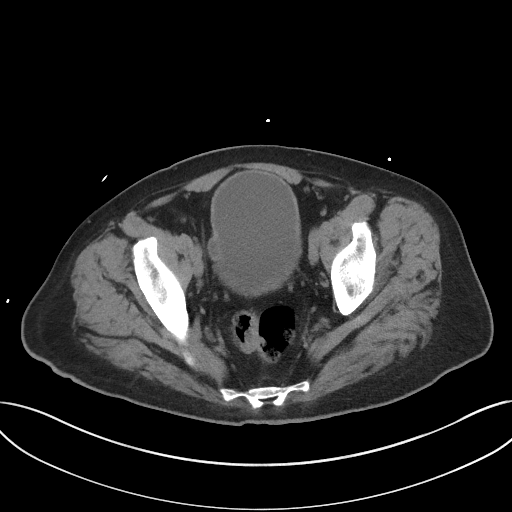
[im 31/91  soft-tissue]
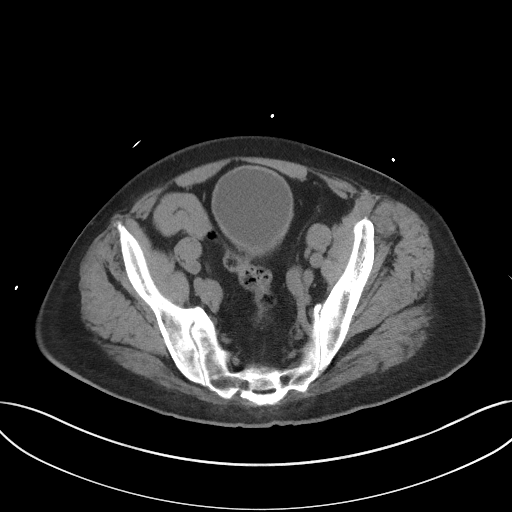
[im 38/91  soft-tissue]
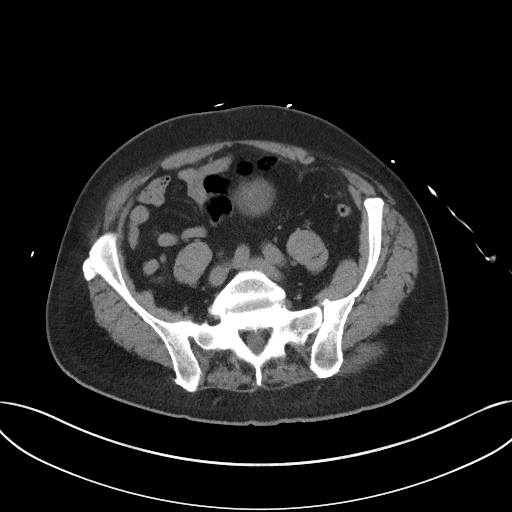
[im 46/91  soft-tissue]
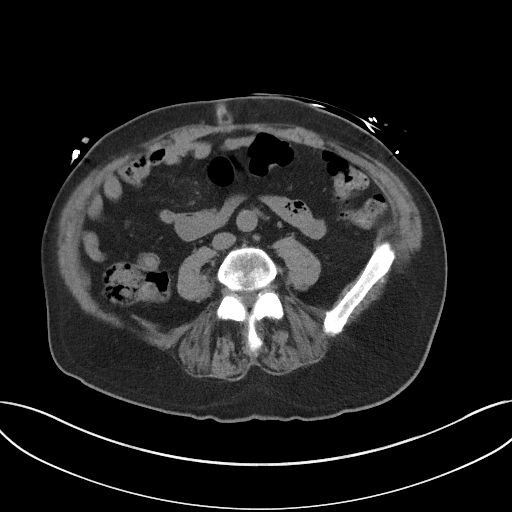
[im 53/91  soft-tissue]
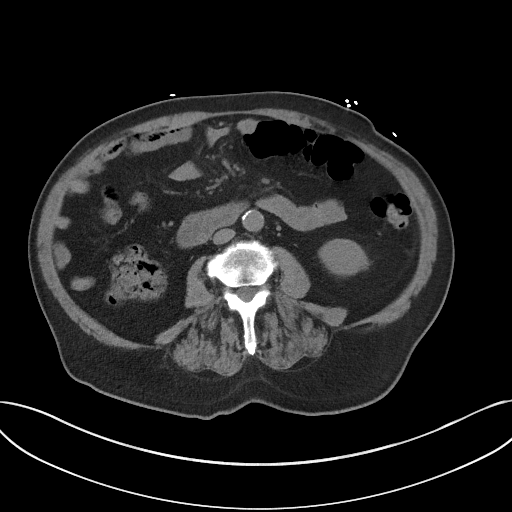
[im 61/91  soft-tissue]
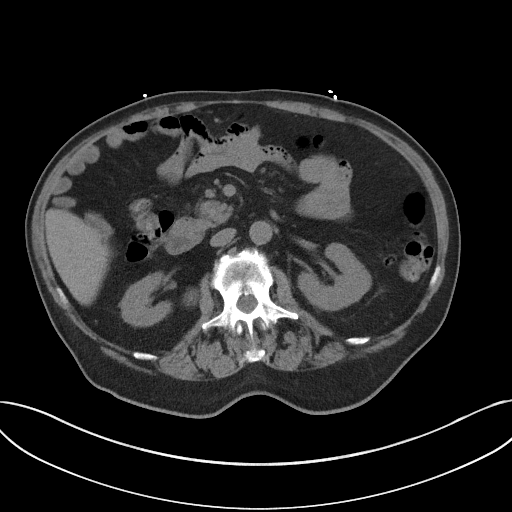
[im 61/91  bone]
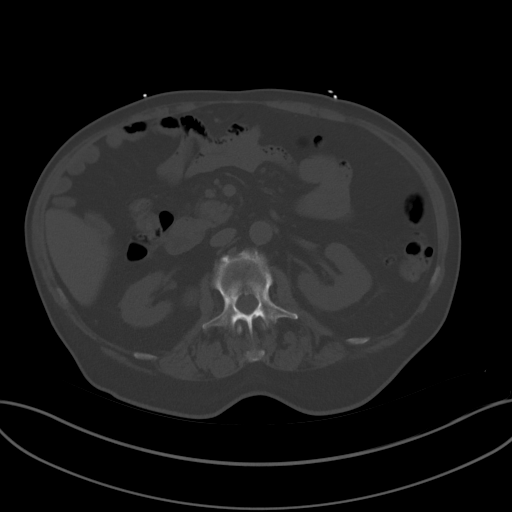
[im 64/91  soft-tissue]
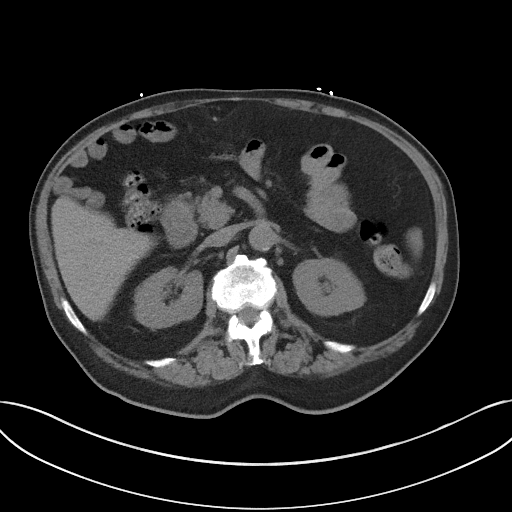
[im 72/91  soft-tissue]
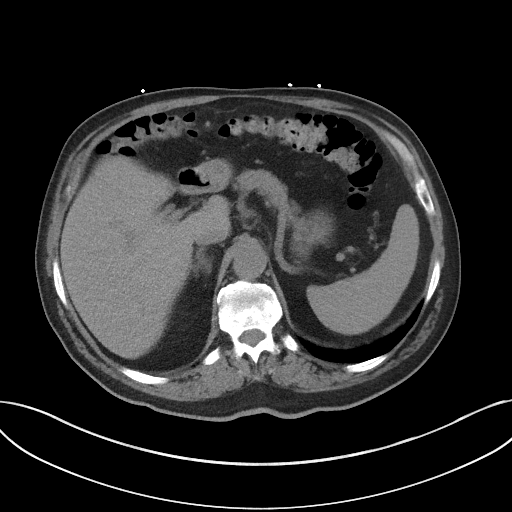
[im 79/91  soft-tissue]
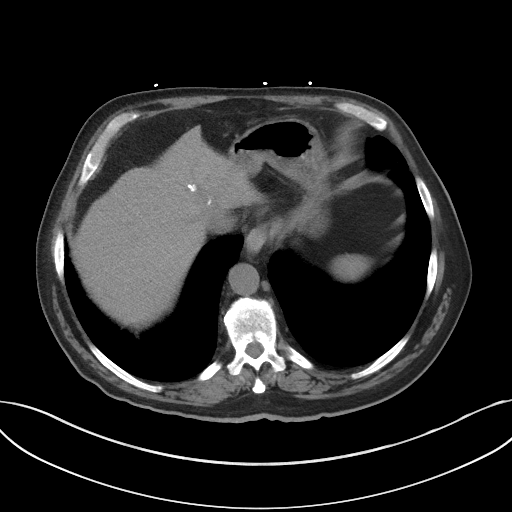
[im 87/91  soft-tissue]
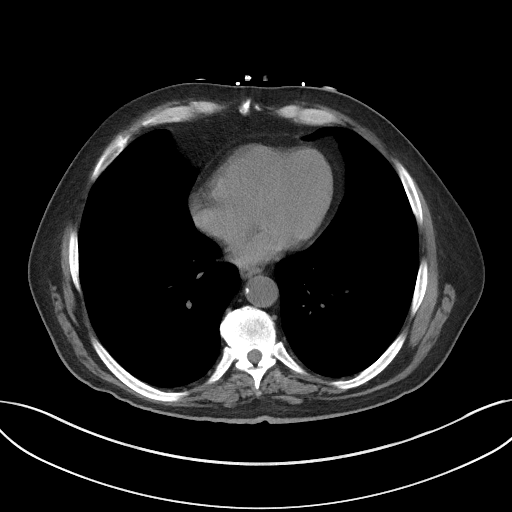

[Series 5: coronal · coronal · 0.76mm/px · 3 of 135 slices shown]
[im 45/135  soft-tissue]
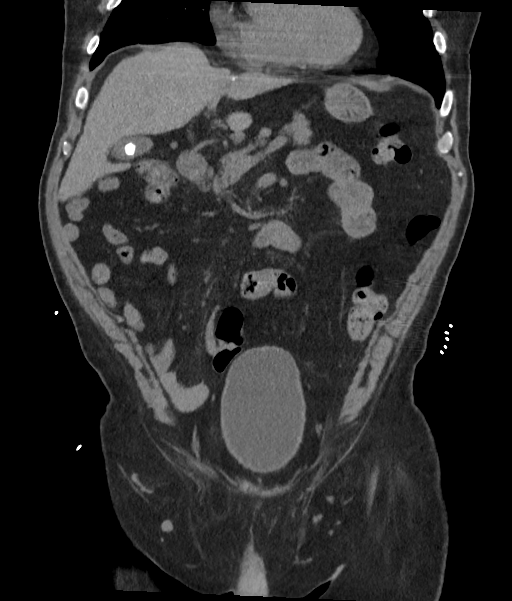
[im 60/135  soft-tissue]
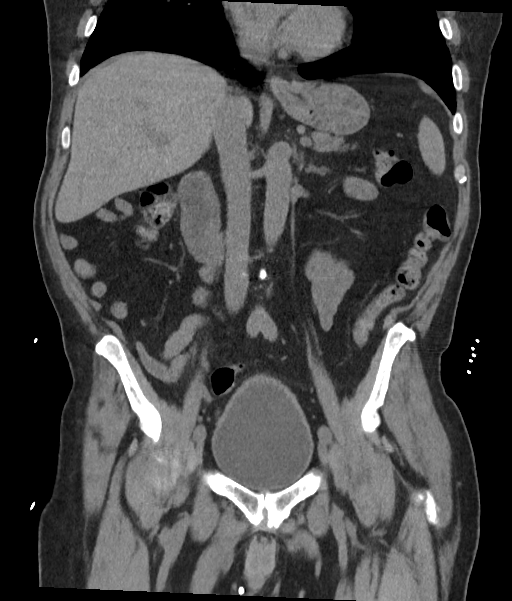
[im 75/135  soft-tissue]
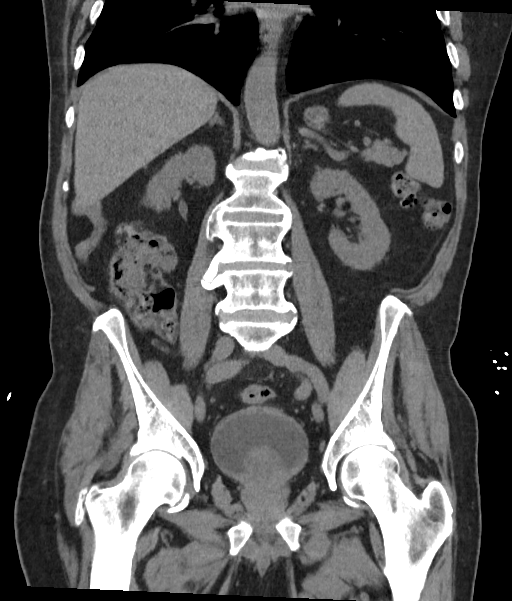

[16 of 46 positions shown; findings below may reference images not displayed]

FINDINGS: Lower chest: Patchy and nodular airspace disease noted right lower
lobe. No pleural effusion.

Hepatobiliary: No focal abnormality in the liver on this study
without intravenous contrast. 9 mm calcified gallstone evident. No
intrahepatic or extrahepatic biliary dilation.

Pancreas: No focal mass lesion. No dilatation of the main duct. No
intraparenchymal cyst. No peripancreatic edema.

Spleen: No splenomegaly. No focal mass lesion.

Adrenals/Urinary Tract: Stable 17 mm left adrenal adenoma. Right
adrenal gland unremarkable. No stones are seen in either kidney or
ureter. No secondary changes in either kidney or ureter. Bladder is
distended with mass-effect on the bladder base presumably related to
median lobe hypertrophy of the prostate gland. Small bladder
diverticuli noted.

Stomach/Bowel: Stomach is unremarkable. No gastric wall thickening.
No evidence of outlet obstruction. Duodenum is normally positioned
as is the ligament of Treitz. No small bowel wall thickening. No
small bowel dilatation. The terminal ileum is normal. The appendix
is normal. No gross colonic mass. No colonic wall thickening.
Diverticular changes are noted in the left colon without evidence of
diverticulitis.

Vascular/Lymphatic: There is abdominal aortic atherosclerosis
without aneurysm. There is no gastrohepatic or hepatoduodenal
ligament lymphadenopathy. No retroperitoneal or mesenteric
lymphadenopathy. No pelvic sidewall lymphadenopathy.

Reproductive: Prostate gland is enlarged.

Other: No intraperitoneal free fluid.

Musculoskeletal: No worrisome lytic or sclerotic osseous
abnormality.
IMPRESSION: 1. Patchy and nodular airspace disease right lower lobe. Imaging
features are compatible with pneumonia, possibly atypical etiology.
Follow-up CT chest without contrast in 3 months could be used to
ensure resolution.
2. Cholelithiasis.
3. Stable 17 mm left adrenal adenoma.
4. Left colonic diverticulosis without diverticulitis.
5. Prostatomegaly. Associated bladder diverticuli raise the question
of underlying component of bladder outlet obstruction.
6. Aortic Atherosclerosis ([3U]-[3U]).

## 2021-02-11 IMAGING — DX DG CHEST 1V PORT
1 series · 1 of 1 positions shown · non-contrast
Comparison: [DATE]

CLINICAL DATA: Cough

EXAM:
PORTABLE CHEST 1 VIEW

[chest ap]
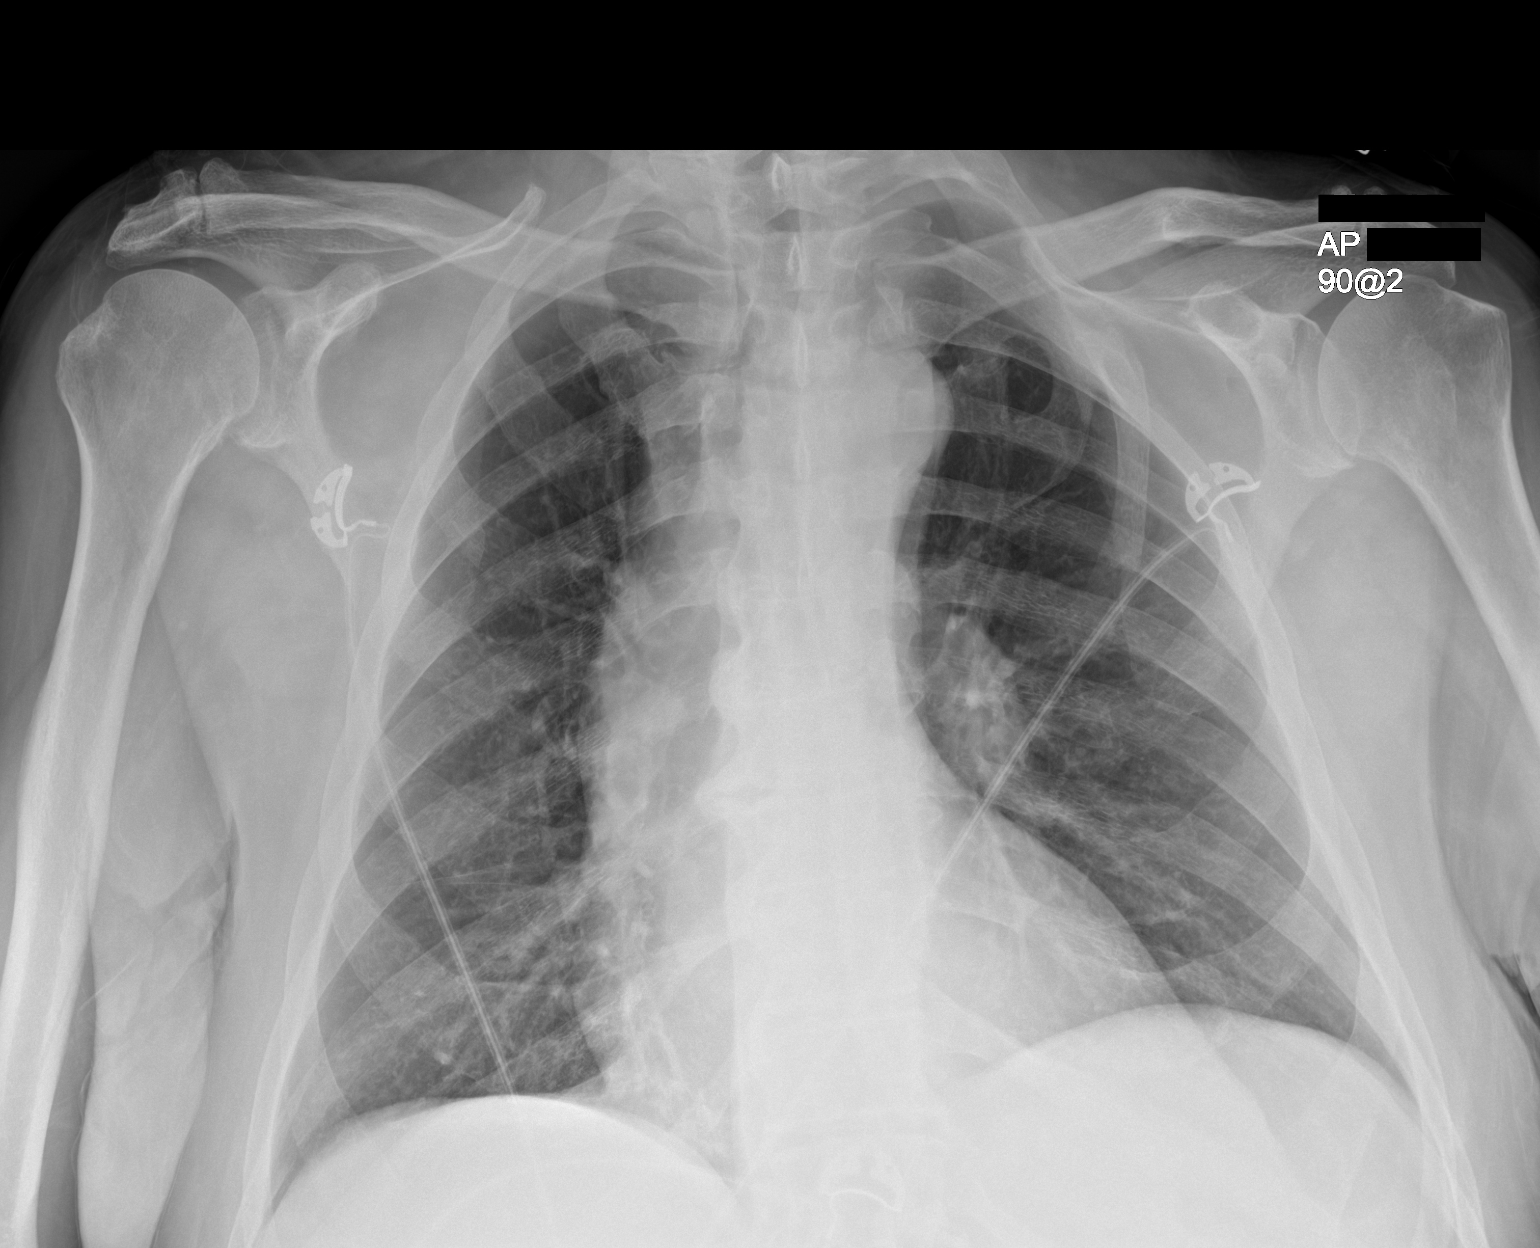

[1 of 1 positions shown; findings below may reference images not displayed]

FINDINGS: The heart size and mediastinal contours are within normal limits.
Both lungs are clear. Chronic left upper rib deformities.
IMPRESSION: No active disease.

## 2021-02-11 MED ORDER — STERILE WATER FOR INJECTION IV SOLN
INTRAVENOUS | Status: DC
  Filled 2021-02-11 (×3): qty 1000
  Filled 2021-02-11: qty 150
  Filled 2021-02-11 (×4): qty 1000
  Filled 2021-02-11: qty 150
  Filled 2021-02-11: qty 1000
  Filled 2021-02-11: qty 150
  Filled 2021-02-11 (×3): qty 1000
  Filled 2021-02-11: qty 150
  Filled 2021-02-11 (×2): qty 1000
  Filled 2021-02-11 (×2): qty 150
  Filled 2021-02-11 (×2): qty 1000

## 2021-02-11 MED ORDER — LACTATED RINGERS IV BOLUS
1000.0000 mL | Freq: Once | INTRAVENOUS | Status: AC
  Administered 2021-02-11: 1000 mL via INTRAVENOUS

## 2021-02-11 NOTE — Assessment & Plan Note (Signed)
Stage 3 last visit.  Check stat labs today

## 2021-02-11 NOTE — Assessment & Plan Note (Signed)
Hgb A1C was 6.4%

## 2021-02-11 NOTE — ED Notes (Signed)
Patient transported to CT 

## 2021-02-11 NOTE — ED Triage Notes (Signed)
Pt comes into the ED via POV and was sent by his physician for abnormal labs, weakness, and loss of 30lbs in 3 months. Pt is being treated for prostate issues when they noticed his creatinine went from 1 to over 6.  Pt denies any complaints at this time.  Pt does have h/o anemia.  Pt ambulatory to triage with even and unlabored respirations.

## 2021-02-11 NOTE — Progress Notes (Signed)
BP 120/76   Pulse 72   Temp 97.9 F (36.6 C)   Resp 16   Ht 5\' 10"  (1.778 m)   Wt 155 lb 1.6 oz (70.4 kg)   SpO2 96%   BMI 22.25 kg/m    Subjective:    Patient ID: Scott Woods, male    DOB: 01/05/1951, 70 y.o.   MRN: [id number]  HPI: Scott Woods is a 70 y.o. male  Chief Complaint  Patient presents with  . Follow-up  . Diabetes  . Hyperlipidemia  . Hypertension   Diabetes: Using medications without difficulties No hypoglycemic episodes No hyperglycemic episodes Feet problems: Blood Sugars averaging: not checking Last Hgb A1C: 6.9  Hypertension  Using medications without difficulty Average home BPs   Using medication without problems or lightheadedness No chest pain with exertion or shortness of breath No Edema  Elevated Cholesterol Using medications without problems No Muscle aches    Weight loss Significant weight loss from lat visit.  Pt states he "just stopped eating."  30 pound weight loss from June and 9 pounds from earlier this month   Tremor New tremor bilateral hands.    Anemia Noted last visit.  Lost to f/u  CKD Noted last visit.  Lost to f/u   Denies smoking history  Relevant past medical, surgical, family and social history reviewed and updated as indicated. Interim medical history since our last visit reviewed. Allergies and medications reviewed and updated.  Review of Systems  Constitutional: Positive for fatigue and unexpected weight change. Negative for fever.  Respiratory: Negative for cough, chest tightness and shortness of breath.   Cardiovascular: Negative for chest pain and palpitations.  Gastrointestinal: Negative for abdominal distention, abdominal pain, diarrhea, nausea and vomiting.  Genitourinary:       Elevated PSA per uro.ogy.  Here for a pre-op clearance  Skin: Positive for pallor.  Psychiatric/Behavioral: Negative for agitation.    Per HPI unless specifically indicated above     Objective:    BP 120/76    Pulse 72   Temp 97.9 F (36.6 C)   Resp 16   Ht 5\' 10"  (1.778 m)   Wt 155 lb 1.6 oz (70.4 kg)   SpO2 96%   BMI 22.25 kg/m   Wt Readings from Last 3 Encounters:  02/11/21 155 lb 1.6 oz (70.4 kg)  01/19/21 164 lb 12.8 oz (74.8 kg)  10/02/20 188 lb 9.6 oz (85.5 kg)    Physical Exam Constitutional:      General: He is not in acute distress.    Appearance: Normal appearance. He is well-developed. He is ill-appearing.  HENT:     Head: Normocephalic and atraumatic.  Eyes:     General: Lids are normal. No scleral icterus.       Right eye: No discharge.        Left eye: No discharge.     Conjunctiva/sclera: Conjunctivae normal.  Neck:     Vascular: No carotid bruit or JVD.  Cardiovascular:     Rate and Rhythm: Normal rate and regular rhythm.     Heart sounds: Normal heart sounds.  Pulmonary:     Effort: Pulmonary effort is normal. No respiratory distress.     Breath sounds: Normal breath sounds.  Abdominal:     General: Abdomen is flat. There is no distension.     Palpations: There is no hepatomegaly or splenomegaly.     Tenderness: There is no abdominal tenderness. There is no rebound.  Musculoskeletal:  General: Normal range of motion.     Cervical back: Normal range of motion and neck supple.  Skin:    General: Skin is warm and Woods.     Coloration: Skin is pale.     Findings: No rash.  Neurological:     Mental Status: He is alert and oriented to person, place, and time.  Psychiatric:        Behavior: Behavior normal.        Thought Content: Thought content normal.        Judgment: Judgment normal.     Results for orders placed or performed in visit on 01/18/21  PSA  Result Value Ref Range   Prostate Specific Ag, Serum 6.0 (H) 0.0 - 4.0 ng/mL      Assessment & Plan:   Problem List Items Addressed This Visit      Unprioritized   Chronic kidney disease (CKD), stage II (mild)    Stage 3 last visit.  Check stat labs today      Hyperlipidemia LDL goal  <100   Relevant Orders   Lipid panel   Hypertension, goal below 140/90   Type 2 diabetes mellitus with microalbuminuria, without long-term current use of insulin (HCC)    Hgb A1C was 6.4%      Relevant Orders   Hemoglobin A1C   Comprehensive metabolic panel   POCT HgB A1C    Other Visit Diagnoses    Weight loss    -  Primary   Significant non-purposeful weight loss in the last 5 months. Stat labs   Relevant Orders   TSH   Anemia, unspecified type       Lost to f/u from 5 months ago.  Check stat labs today   Relevant Orders   CBC with Differential/Platelet   Preop cardiovascular exam       EKG NSR without STTW changes.  Will need to f/u with labs to determine stability for upcoming prostate bx   Relevant Orders   EKG 12-Lead       Follow up plan: Folow up in the ER  Addendum: Stat labs returned showing pt witn a creatnine of 6.95 and GFR of 8 (increase from 1.48 5 months ago).  [facility] unable to see hi this week.  Will refer to the ER.

## 2021-02-11 NOTE — ED Notes (Signed)
ED Provider at bedside. 

## 2021-02-11 NOTE — ED Provider Notes (Signed)
[facility] Emergency Department Provider Note  ____________________________________________   Event Date/Time   First MD Initiated Contact with Patient 02/11/21 2051     (approximate)  I have reviewed the triage vital signs and the nursing notes.   HISTORY  Chief Complaint Abnormal Lab    HPI Scott Woods is a 70 y.o. male  Here with difficulty urinating, weakness, abnormal lab. Pt reports that over the past month, he's had progressive worsening with loss of appetite, fatigue. He has lost 30 lb since June and 9 since last month. He's had associated fatigue and poor energy. Otherwise, he has been well. Denies any urinary sx. Denies any med changes. No OTC med use. He had a check-up with his PCP today for this and had labs which showed significant AKI, told to come inf or evaluation. He does have a h/o prostatomegaly, elevated PSA and was scheduled for biopsy soon. He was being cleared by PCP today for this. No fever, chills. No other complaints.       Past Medical History:  Diagnosis Date  . Adrenal nodule ([place])   . Blood in the urine 05/08/2015  . Borderline diabetes   . Chronic kidney disease   . CKD (chronic kidney disease), stage II   . Diabetes mellitus without complication ([place])   . Elevated PSA   . Gross hematuria   . H/O type B viral hepatitis 05/08/2015   When he was a child   . History of hepatitis B   . HTN (hypertension)   . Hyperlipidemia with target LDL less than 100   . Obesity   . Pre-ulcerative calluses 03/28/2016   great toe   . Prostate cancer [facility])     Patient Active Problem List   Diagnosis Date Noted  . Increased anion gap metabolic acidosis 71/24/5809  . Abnormal chest CT 02/12/2021  . Acute kidney injury superimposed on CKD II ([place]) 02/11/2021  . Generalized weakness 02/11/2021  . Current mild episode of major depressive disorder ([place]) 10/02/2020  . BPH with obstruction/lower urinary tract symptoms 04/01/2019  .  Type 2 diabetes mellitus with microalbuminuria, without long-term current use of insulin ([place]) 11/16/2017  . Adrenal adenoma 05/08/2015  . Chronic kidney disease (CKD), stage II (mild) 05/08/2015  . Abnormal prostate specific antigen 05/08/2015  . Hyperlipidemia LDL goal <100 05/08/2015  . Hypertension, goal below 140/90 05/08/2015  . Diabetes mellitus type 2, controlled, without complications ([place]) 98/33/8250  . Overweight (BMI 25.0-29.9) 05/08/2015    Past Surgical History:  Procedure Laterality Date  . ADENOIDECTOMY    . COLONOSCOPY  11/18/2014  . PROCTOSCOPY  04/2014  . TONSILLECTOMY      Prior to Admission medications   Medication Sig Start Date End Date Taking? Authorizing Provider  aspirin EC 81 MG tablet Take 1 tablet (81 mg total) by mouth daily. 04/02/16   Arnetha Courser, MD  atorvastatin (LIPITOR) 40 MG tablet TAKE 1 TABLET BY MOUTH EVERYDAY AT BEDTIME 10/02/20   [name], PA-C  ciprofloxacin (CIPRO) 500 MG tablet Take 1 tablet by mouth 2 (two) times daily. 04/03/14   [provider]  finasteride (PROSCAR) 5 MG tablet TAKE 1 TABLET BY MOUTH EVERY DAY 03/10/20   McGowan, Larene Beach A, PA-C  lisinopril (ZESTRIL) 10 MG tablet Take 1 tablet (10 mg total) by mouth daily. 10/02/20   [name], PA-C  metFORMIN (GLUCOPHAGE) 500 MG tablet Take 2 tablets twice daily with meals 10/02/20   [name], PA-C  Allergies Patient has no known allergies.  Family History  Problem Relation Age of Onset  . Parkinson's disease Brother   . Heart disease Mother   . Diabetes Mellitus II Mother   . Coronary artery disease Mother   . Heart disease Father   . Prostate cancer Neg Hx   . Kidney cancer Neg Hx   . Bladder Cancer Neg Hx     Social History Social History   Tobacco Use  . Smoking status: Never Smoker  . Smokeless tobacco: Never Used  Vaping Use  . Vaping Use: Never used  Substance Use Topics  . Alcohol use: No    Alcohol/week: 0.0 standard drinks  . Drug  use: No    Review of Systems  Review of Systems  Constitutional: Positive for appetite change, fatigue and unexpected weight change. Negative for chills and fever.  HENT: Negative for sore throat.   Respiratory: Negative for shortness of breath.   Cardiovascular: Negative for chest pain.  Gastrointestinal: Negative for abdominal pain.  Genitourinary: Negative for flank pain.  Musculoskeletal: Negative for neck pain.  Skin: Negative for rash and wound.  Allergic/Immunologic: Negative for immunocompromised state.  Neurological: Positive for weakness. Negative for numbness.  Hematological: Does not bruise/bleed easily.  All other systems reviewed and are negative.    ____________________________________________  PHYSICAL EXAM:      VITAL SIGNS: ED Triage Vitals  Enc Vitals Group     BP 02/11/21 1630 99/69     Pulse Rate 02/11/21 1630 (!) 110     Resp 02/11/21 1630 20     Temp 02/11/21 1630 97.7 F (36.5 C)     Temp Source 02/11/21 1630 Oral     SpO2 02/11/21 1630 100 %     Weight 02/11/21 1630 150 lb (68 kg)     Height 02/11/21 1630 5\' 10"  (1.778 m)     Head Circumference --      Peak Flow --      Pain Score 02/11/21 1637 0     Pain Loc --      Pain Edu? --      Excl. in [place]? --      Physical Exam Vitals and nursing note reviewed.  Constitutional:      General: He is not in acute distress.    Appearance: He is well-developed.  HENT:     Head: Normocephalic and atraumatic.     Mouth/Throat:     Mouth: Mucous membranes are dry.  Eyes:     Conjunctiva/sclera: Conjunctivae normal.  Cardiovascular:     Rate and Rhythm: Normal rate and regular rhythm.     Heart sounds: Normal heart sounds. No murmur heard. No friction rub.  Pulmonary:     Effort: Pulmonary effort is normal. No respiratory distress.     Breath sounds: Normal breath sounds. No wheezing or rales.  Abdominal:     General: There is no distension.     Palpations: Abdomen is soft.     Tenderness: There  is no abdominal tenderness.  Musculoskeletal:     Cervical back: Neck supple.  Skin:    General: Skin is warm.     Capillary Refill: Capillary refill takes less than 2 seconds.  Neurological:     Mental Status: He is alert and oriented to person, place, and time.     Motor: No abnormal muscle tone.       ____________________________________________   LABS (all labs ordered are listed, but only abnormal results are  displayed)  Labs Reviewed  RESP PANEL BY RT-PCR (FLU A&B, COVID) ARPGX2 - Abnormal; Notable for the following components:      Result Value   SARS Coronavirus 2 by RT PCR POSITIVE (*)    All other components within normal limits  URINALYSIS, COMPLETE (UACMP) WITH MICROSCOPIC - Abnormal; Notable for the following components:   Color, Urine YELLOW (*)    APPearance HAZY (*)    Glucose, UA 50 (*)    All other components within normal limits  BASIC METABOLIC PANEL - Abnormal; Notable for the following components:   Sodium 134 (*)    CO2 15 (*)    Glucose, Bld 138 (*)    BUN 108 (*)    Creatinine, Ser 6.80 (*)    GFR, Estimated 8 (*)    All other components within normal limits  URINE CULTURE  MAGNESIUM  HIV ANTIBODY (ROUTINE TESTING W REFLEX)  CBC  BASIC METABOLIC PANEL  CBC  PROCALCITONIN    ____________________________________________  EKG: Sinus tachycardia, VR 115. PR 168, QRS 82, QTc 426. No acute ST elevations or depressions. No ischemia or infarct. ________________________________________  RADIOLOGY All imaging, including plain films, CT scans, and ultrasounds, independently reviewed by me, and interpretations confirmed via formal radiology reads.  ED MD interpretation:   CXR: Clear CT Stone: BPH w/ bladder diverticulum, PNA noted in lung bases  Official radiology report(s): DG Chest Portable 1 View  Result Date: 02/11/2021 CLINICAL DATA:  Cough EXAM: PORTABLE CHEST 1 VIEW COMPARISON:  10/10/2019 FINDINGS: The heart size and mediastinal  contours are within normal limits. Both lungs are clear. Chronic left upper rib deformities. IMPRESSION: No active disease. Electronically Signed   By: Donavan Foil M.D.   On: 02/11/2021 22:49   CT Renal Stone Study  Result Date: 02/11/2021 CLINICAL DATA:  Hematuria. EXAM: CT ABDOMEN AND PELVIS WITHOUT CONTRAST TECHNIQUE: Multidetector CT imaging of the abdomen and pelvis was performed following the standard protocol without IV contrast. COMPARISON:  11/16/2016 FINDINGS: Lower chest: Patchy and nodular airspace disease noted right lower lobe. No pleural effusion. Hepatobiliary: No focal abnormality in the liver on this study without intravenous contrast. 9 mm calcified gallstone evident. No intrahepatic or extrahepatic biliary dilation. Pancreas: No focal mass lesion. No dilatation of the main duct. No intraparenchymal cyst. No peripancreatic edema. Spleen: No splenomegaly. No focal mass lesion. Adrenals/Urinary Tract: Stable 17 mm left adrenal adenoma. Right adrenal gland unremarkable. No stones are seen in either kidney or ureter. No secondary changes in either kidney or ureter. Bladder is distended with mass-effect on the bladder base presumably related to median lobe hypertrophy of the prostate gland. Small bladder diverticuli noted. Stomach/Bowel: Stomach is unremarkable. No gastric wall thickening. No evidence of outlet obstruction. Duodenum is normally positioned as is the ligament of Treitz. No small bowel wall thickening. No small bowel dilatation. The terminal ileum is normal. The appendix is normal. No gross colonic mass. No colonic wall thickening. Diverticular changes are noted in the left colon without evidence of diverticulitis. Vascular/Lymphatic: There is abdominal aortic atherosclerosis without aneurysm. There is no gastrohepatic or hepatoduodenal ligament lymphadenopathy. No retroperitoneal or mesenteric lymphadenopathy. No pelvic sidewall lymphadenopathy. Reproductive: Prostate gland is  enlarged. Other: No intraperitoneal free fluid. Musculoskeletal: No worrisome lytic or sclerotic osseous abnormality. IMPRESSION: 1. Patchy and nodular airspace disease right lower lobe. Imaging features are compatible with pneumonia, possibly atypical etiology. Follow-up CT chest without contrast in 3 months could be used to ensure resolution. 2. Cholelithiasis. 3. Stable 17 mm  left adrenal adenoma. 4. Left colonic diverticulosis without diverticulitis. 5. Prostatomegaly. Associated bladder diverticuli raise the question of underlying component of bladder outlet obstruction. 6. Aortic Atherosclerosis (ICD10-I70.0). Electronically Signed   By: Misty Stanley M.D.   On: 02/11/2021 21:30    ____________________________________________  PROCEDURES   Procedure(s) performed (including Critical Care):  .1-3 Lead EKG Interpretation Performed by: Duffy Bruce, MD Authorized by: Duffy Bruce, MD     Interpretation: normal     ECG rate:  90-100   ECG rate assessment: normal     Rhythm: sinus rhythm     Ectopy: none     Conduction: normal   Comments:     Indication: AKI    ____________________________________________  INITIAL IMPRESSION / MDM / ASSESSMENT AND PLAN / ED COURSE  As part of my medical decision making, I reviewed the following data within the [place] notes reviewed and incorporated, Old chart reviewed, Notes from prior ED visits, and [place] Controlled Substance Database       *HOLLIS TULLER was evaluated in Emergency Department on 02/12/2021 for the symptoms described in the history of present illness. He was evaluated in the context of the global COVID-19 pandemic, which necessitated consideration that the patient might be at risk for infection with the SARS-CoV-2 virus that causes COVID-19. Institutional protocols and algorithms that pertain to the evaluation of patients at risk for COVID-19 are in a state of rapid change based on information  released by regulatory bodies including the CDC and federal and state organizations. These policies and algorithms were followed during the patient's care in the ED.  Some ED evaluations and interventions may be delayed as a result of limited staffing during the pandemic.*     Medical Decision Making:  70 yo M here with generalized weakness, abnormal labs. Labs from PCP visit reviewed, notable for severe AKI with Cr>6, BUN>100. Pt has h/o BPH and bladder scan shows >320 after voiding here. Will place foley, admit for AKI possibly 2/2 acute on chronic urinary retention. CT stone study obtained, shows BPH with bladder diverticulum c/w retention, but no hydro. UA without UTI. Denies regular NSAID use or other nephrotoxin exposures. Will give fluid bolus then place on isotonic bicarb, admit.    Of note, incidental comment made of PNA on CT. He has had a mild cough for "a week" or so. No hypoxia. Will check COVID.  ____________________________________________  FINAL CLINICAL IMPRESSION(S) / ED DIAGNOSES  Final diagnoses:  AKI (acute kidney injury) ([place])  Urinary retention  Benign prostatic hyperplasia with urinary retention     MEDICATIONS GIVEN DURING THIS VISIT:  Medications  sodium bicarbonate 150 mEq in sterile water 1,150 mL infusion ( Intravenous New Bag/Given 02/11/21 2231)  insulin aspart (novoLOG) injection 0-9 Units (has no administration in time range)  insulin aspart (novoLOG) injection 0-5 Units (0 Units Subcutaneous Not Given 02/12/21 0027)  heparin injection 5,000 Units (has no administration in time range)  acetaminophen (TYLENOL) tablet 650 mg (has no administration in time range)    Or  acetaminophen (TYLENOL) suppository 650 mg (has no administration in time range)  HYDROcodone-acetaminophen (NORCO/VICODIN) 5-325 MG per tablet 1-2 tablet (has no administration in time range)  ondansetron (ZOFRAN) tablet 4 mg (has no administration in time range)    Or  ondansetron (ZOFRAN)  injection 4 mg (has no administration in time range)  lactated ringers bolus 1,000 mL (0 mLs Intravenous Stopped 02/11/21 2221)     ED Discharge Orders  None       Note:  This document was prepared using Dragon voice recognition software and may include unintentional dictation errors.   Duffy Bruce, MD 02/12/21 [phone]

## 2021-02-11 NOTE — ED Notes (Signed)
Pt back from CT at this time 

## 2021-02-12 ENCOUNTER — Inpatient Hospital Stay: Payer: Medicare (Managed Care)

## 2021-02-12 ENCOUNTER — Other Ambulatory Visit: Payer: Self-pay | Admitting: Physician Assistant

## 2021-02-12 DIAGNOSIS — N179 Acute kidney failure, unspecified: Secondary | ICD-10-CM

## 2021-02-12 DIAGNOSIS — N189 Chronic kidney disease, unspecified: Secondary | ICD-10-CM

## 2021-02-12 DIAGNOSIS — E8729 Other acidosis: Secondary | ICD-10-CM

## 2021-02-12 DIAGNOSIS — R9389 Abnormal findings on diagnostic imaging of other specified body structures: Secondary | ICD-10-CM

## 2021-02-12 DIAGNOSIS — E872 Acidosis: Secondary | ICD-10-CM

## 2021-02-12 LAB — CBC
HCT: 26.8 % — ABNORMAL LOW (ref 39.0–52.0)
Hemoglobin: 9.2 g/dL — ABNORMAL LOW (ref 13.0–17.0)
MCH: 30 pg (ref 26.0–34.0)
MCHC: 34.3 g/dL (ref 30.0–36.0)
MCV: 87.3 fL (ref 80.0–100.0)
Platelets: 195 10*3/uL (ref 150–400)
RBC: 3.07 MIL/uL — ABNORMAL LOW (ref 4.22–5.81)
RDW: 12.8 % (ref 11.5–15.5)
WBC: 5.7 10*3/uL (ref 4.0–10.5)
nRBC: 0 % (ref 0.0–0.2)

## 2021-02-12 LAB — RESP PANEL BY RT-PCR (FLU A&B, COVID) ARPGX2
Influenza A by PCR: NEGATIVE
Influenza B by PCR: NEGATIVE
SARS Coronavirus 2 by RT PCR: POSITIVE — AB

## 2021-02-12 LAB — GLUCOSE, CAPILLARY
Glucose-Capillary: 138 mg/dL — ABNORMAL HIGH (ref 70–99)
Glucose-Capillary: 87 mg/dL (ref 70–99)
Glucose-Capillary: 103 mg/dL — ABNORMAL HIGH (ref 70–99)

## 2021-02-12 LAB — PROCALCITONIN: Procalcitonin: 0.3 ng/mL

## 2021-02-12 LAB — BASIC METABOLIC PANEL
Anion gap: 14 (ref 5–15)
BUN: 100 mg/dL — ABNORMAL HIGH (ref 8–23)
CO2: 16 mmol/L — ABNORMAL LOW (ref 22–32)
Calcium: 8.3 mg/dL — ABNORMAL LOW (ref 8.9–10.3)
Chloride: 106 mmol/L (ref 98–111)
Creatinine, Ser: 5.72 mg/dL — ABNORMAL HIGH (ref 0.61–1.24)
GFR, Estimated: 10 mL/min — ABNORMAL LOW (ref >60)
Glucose, Bld: 122 mg/dL — ABNORMAL HIGH (ref 70–99)
Potassium: 4.2 mmol/L (ref 3.5–5.1)
Sodium: 136 mmol/L (ref 135–145)

## 2021-02-12 LAB — HIV ANTIBODY (ROUTINE TESTING W REFLEX): HIV Screen 4th Generation wRfx: NONREACTIVE

## 2021-02-12 LAB — CBG MONITORING, ED: Glucose-Capillary: 117 mg/dL — ABNORMAL HIGH (ref 70–99)

## 2021-02-12 IMAGING — US US RENAL
1 series · 15 of 25 positions shown · non-contrast
Comparison: CT from the previous day.

CLINICAL DATA: Acute renal injury

EXAM:
RENAL / URINARY TRACT ULTRASOUND COMPLETE

[Series 1: us renal · 37 acquisitions, 15 frames shown]
[im 1/37]
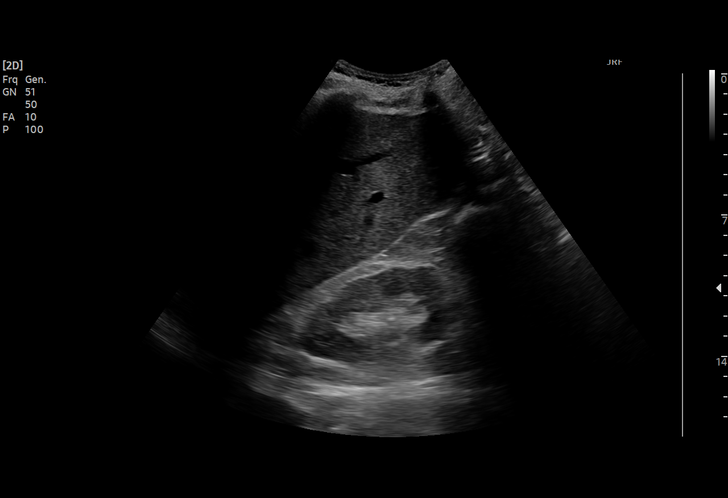
[im 4/37]
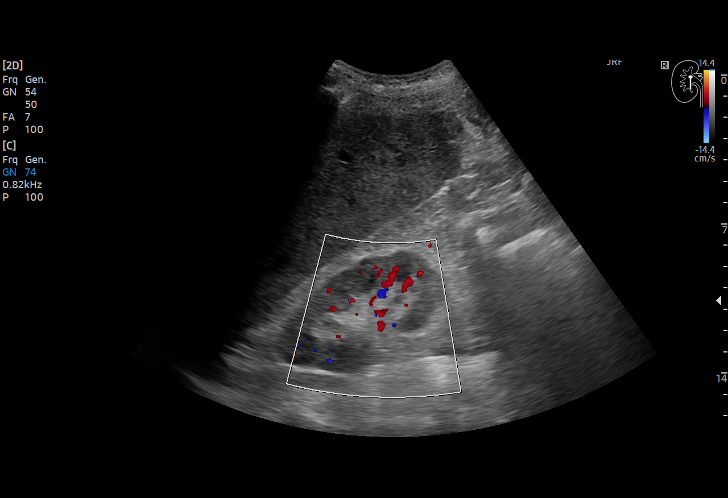
[im 7/37]
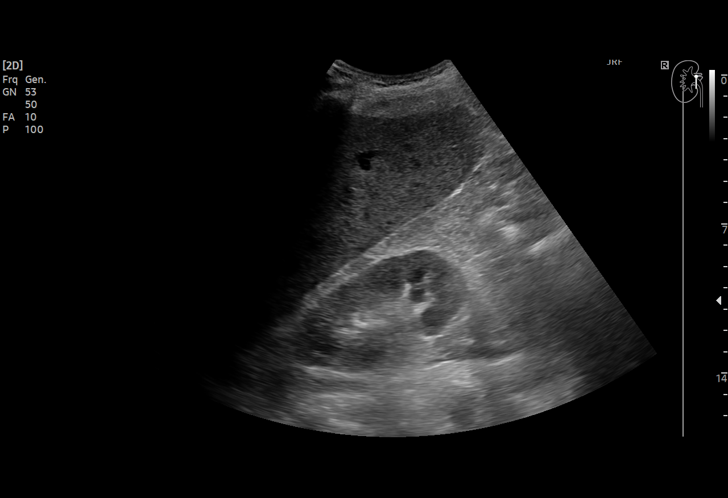
[im 8/37]
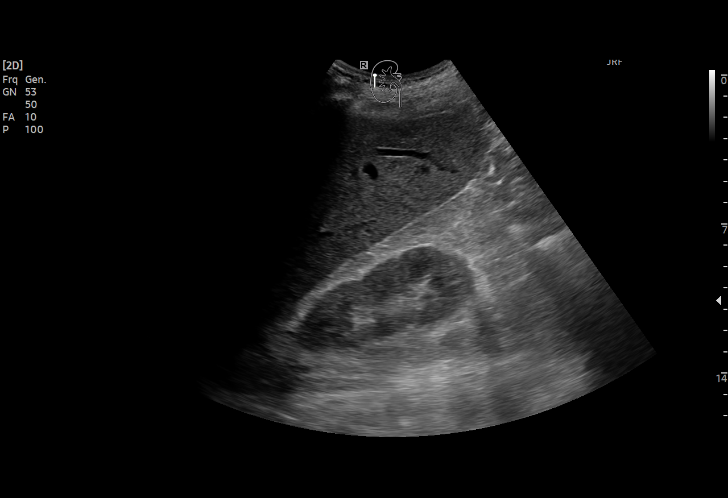
[im 11/37]
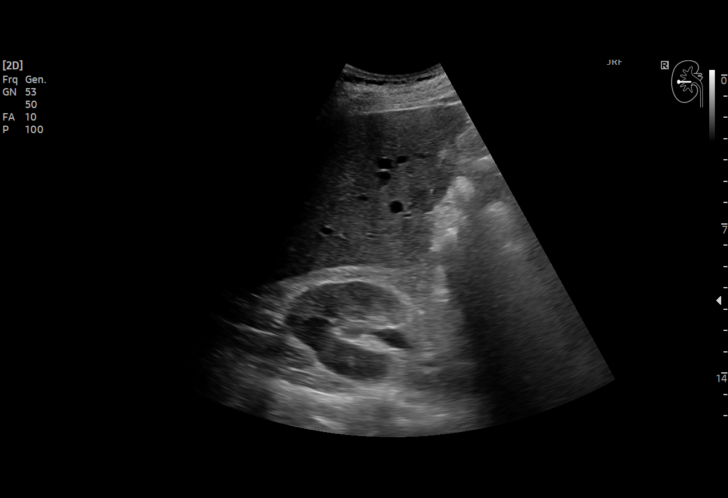
[im 14/37]
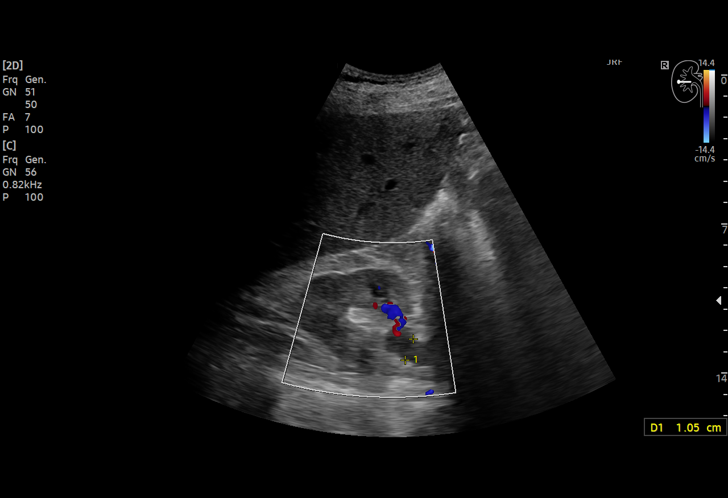
[im 16/37]
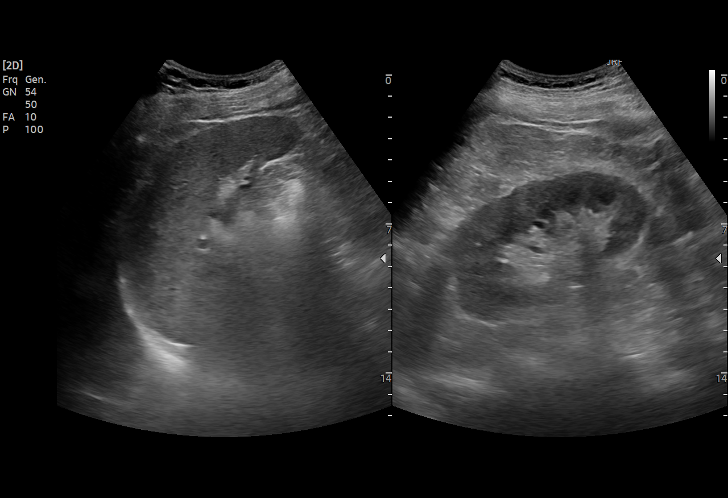
[im 19/37]
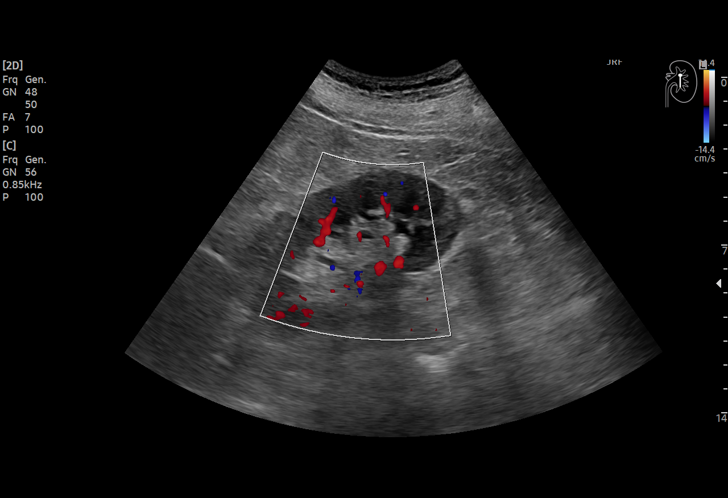
[im 22/37]
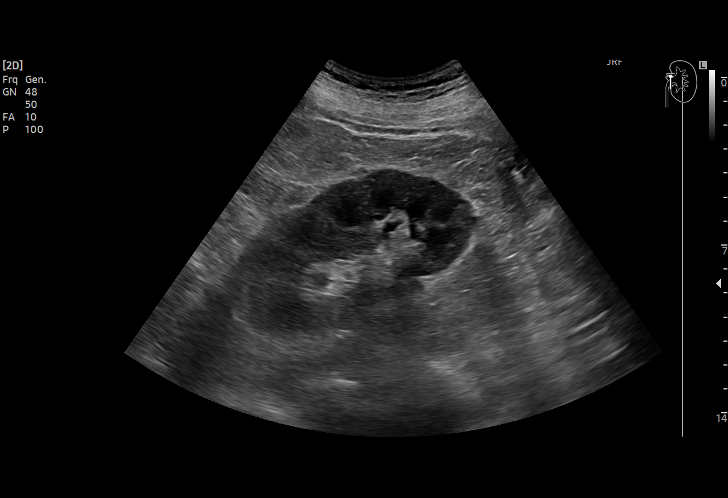
[im 23/37]
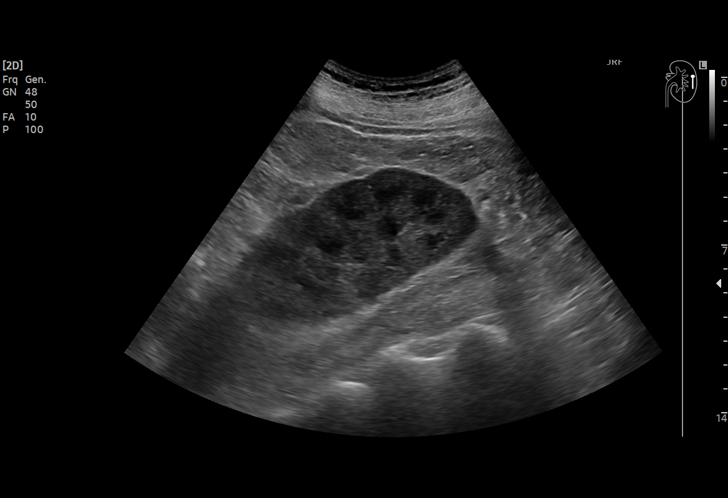
[im 26/37]
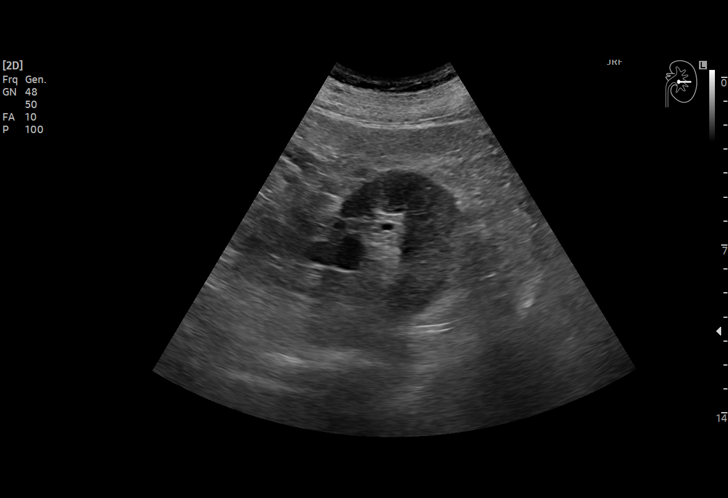
[im 29/37]
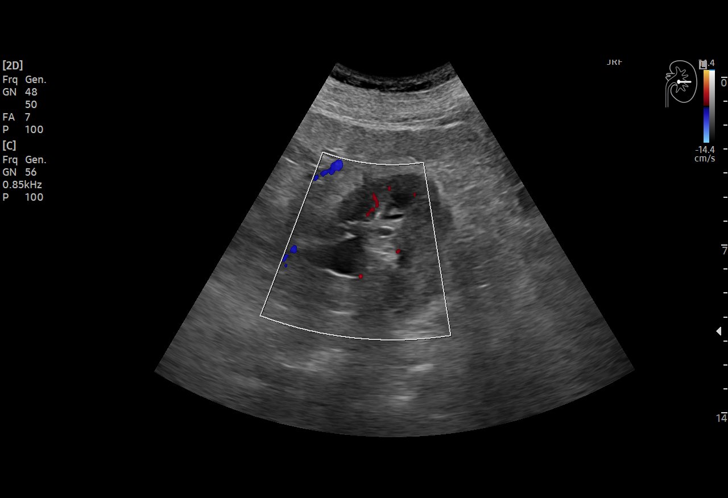
[im 31/37]
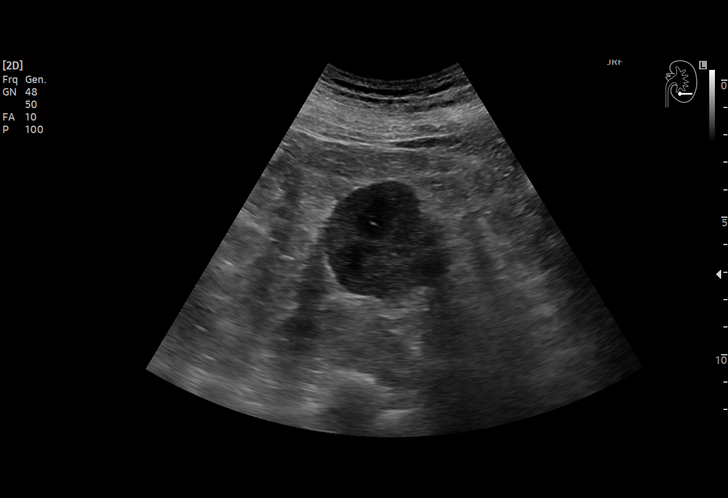
[im 34/37]
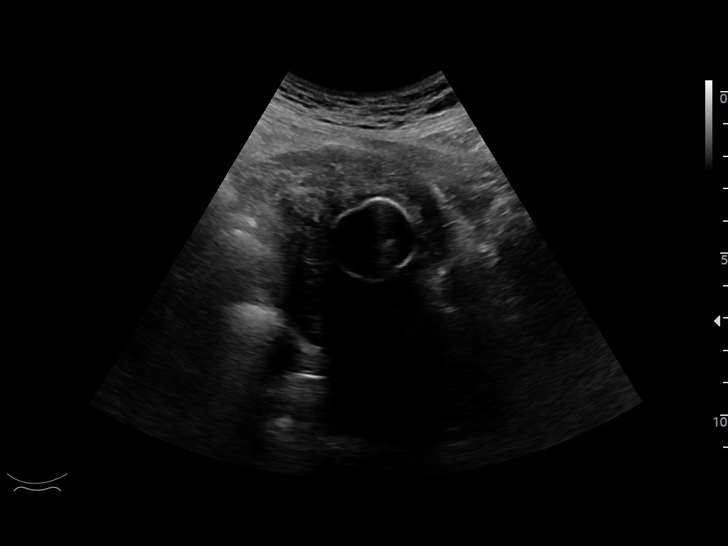
[im 37/37]
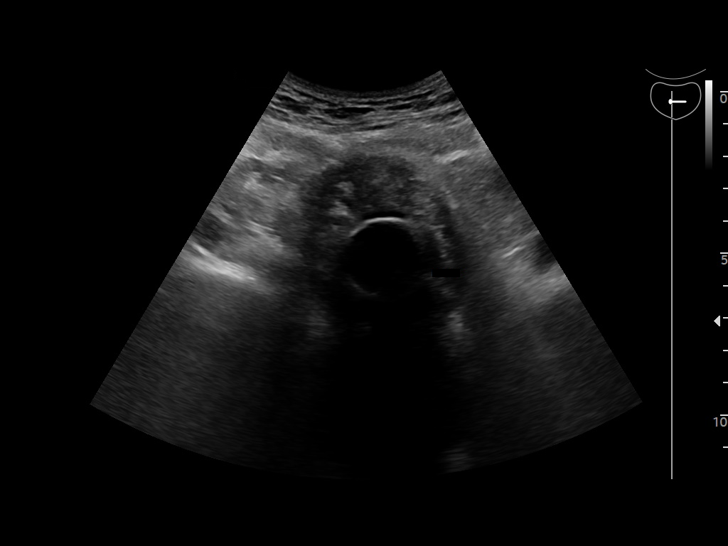

[15 of 25 positions shown; findings below may reference images not displayed]

FINDINGS: Right Kidney:

Renal measurements: 9.0 x 4.7 x 5.5 cm. = volume: 123 mL.
Echogenicity within normal limits. No mass or hydronephrosis
visualized.

Left Kidney:

Renal measurements: 10.3 x 5.2 x 5.8 cm. = volume: 163 mL.
Echogenicity within normal limits. No mass or hydronephrosis
visualized.

Bladder:

Decompressed by Foley catheter.

Other:

None.
IMPRESSION: No acute renal abnormality noted.

## 2021-02-12 MED ORDER — ONDANSETRON HCL 4 MG PO TABS: 4.0000 mg | ORAL_TABLET | Freq: Four times a day (QID) | ORAL | Status: DC | PRN

## 2021-02-12 MED ORDER — INSULIN ASPART 100 UNIT/ML IJ SOLN
0.0000 [IU] | Freq: Three times a day (TID) | INTRAMUSCULAR | Status: DC
  Administered 2021-02-12 – 2021-02-16 (×3): 1 [IU] via SUBCUTANEOUS
  Administered 2021-02-18: 2 [IU] via SUBCUTANEOUS
  Filled 2021-02-12 (×6): qty 1

## 2021-02-12 MED ORDER — HYDROCODONE-ACETAMINOPHEN 5-325 MG PO TABS
1.0000 | ORAL_TABLET | ORAL | Status: DC | PRN
  Administered 2021-02-14: 1 via ORAL
  Administered 2021-02-15: 2 via ORAL
  Filled 2021-02-12 (×2): qty 2

## 2021-02-12 MED ORDER — INSULIN ASPART 100 UNIT/ML IJ SOLN: 0.0000 [IU] | Freq: Every day | INTRAMUSCULAR | Status: DC

## 2021-02-12 MED ORDER — ACETAMINOPHEN 325 MG PO TABS: 650.0000 mg | ORAL_TABLET | Freq: Four times a day (QID) | ORAL | Status: DC | PRN

## 2021-02-12 MED ORDER — ONDANSETRON HCL 4 MG/2ML IJ SOLN
4.0000 mg | Freq: Four times a day (QID) | INTRAMUSCULAR | Status: DC | PRN
  Administered 2021-02-15 – 2021-02-16 (×2): 4 mg via INTRAVENOUS
  Filled 2021-02-12 (×2): qty 2

## 2021-02-12 MED ORDER — HEPARIN SODIUM (PORCINE) 5000 UNIT/ML IJ SOLN
5000.0000 [IU] | Freq: Three times a day (TID) | INTRAMUSCULAR | Status: DC
  Administered 2021-02-12 – 2021-02-19 (×22): 5000 [IU] via SUBCUTANEOUS
  Filled 2021-02-12 (×22): qty 1

## 2021-02-12 MED ORDER — ACETAMINOPHEN 650 MG RE SUPP: 650.0000 mg | Freq: Four times a day (QID) | RECTAL | Status: DC | PRN

## 2021-02-12 MED ORDER — SODIUM CHLORIDE 0.9 % IV SOLN: INTRAVENOUS | Status: DC

## 2021-02-12 NOTE — Consult Note (Signed)
Urology Consult  I have been asked to see the patient by Dr. Kurtis Bushman, for evaluation and management of urinary retention, BOO, AKI.  Chief Complaint: Abnormal lab  History of Present Illness: Scott Woods is a 70 y.o. year old comorbid male with a complex urologic history including BPH with LUTS on finasteride, left adrenal adenoma, hematuria with benign work-up in 2018 who has subsequently declined repeat CT urogram and cystoscopy, and elevated PSA with negative biopsy in 2015 scheduled for repeat prostate biopsy next week admitted on 02/11/2021 after pre-biopsy labs ordered by his PCP revealed acute renal failure with creatinine 6.95.  Admission labs notable for UA with no nitrites, 0-5 WBCs/hpf, 0-5 RBCs/hpf, and no bacteria and creatinine 6.80 (baseline 1.4), and COVID positive. CT stone study revealed a stable left renal adenoma and distended urinary bladder with evidence of bladder outlet obstruction. No evidence of urolithiasis or hydronephrosis.  Foley catheter was placed upon presentation to the ED with repeat creatinine this morning 5.72. Foley in place today draining pink tinged urine.  Today he denies abdominal pain prior to admission and denies difficulty urinating or the sensation of incomplete emptying. He is primarily concerned with his recent significant weight loss of ~30lbs, which he attributes to decreased PO intake secondary to poor dentition. He also denies upper respiratory symptoms associated with his COVID infection.  Past Medical History:  Diagnosis Date  . Adrenal nodule ([place])   . Blood in the urine 05/08/2015  . Borderline diabetes   . Chronic kidney disease   . CKD (chronic kidney disease), stage II   . Diabetes mellitus without complication ([place])   . Elevated PSA   . Gross hematuria   . H/O type B viral hepatitis 05/08/2015   When he was a child   . History of hepatitis B   . HTN (hypertension)   . Hyperlipidemia with target LDL less than 100   . Obesity    . Pre-ulcerative calluses 03/28/2016   great toe   . Prostate cancer [facility])     Past Surgical History:  Procedure Laterality Date  . ADENOIDECTOMY    . COLONOSCOPY  11/18/2014  . PROCTOSCOPY  04/2014  . TONSILLECTOMY      Home Medications:  No outpatient medications have been marked as taking for the 02/11/21 encounter [facility] Encounter).    Allergies: No Known Allergies  Family History  Problem Relation Age of Onset  . Parkinson's disease Brother   . Heart disease Mother   . Diabetes Mellitus II Mother   . Coronary artery disease Mother   . Heart disease Father   . Prostate cancer Neg Hx   . Kidney cancer Neg Hx   . Bladder Cancer Neg Hx     Social History:  reports that he has never smoked. He has never used smokeless tobacco. He reports that he does not drink alcohol and does not use drugs.  ROS: A complete review of systems was performed.  All systems are negative except for pertinent findings as noted.  Physical Exam:  Vital signs in last 24 hours: Temp:  [97.3 F (36.3 C)-98.7 F (37.1 C)] 98.7 F (37.1 C) (05/27 1103) Pulse Rate:  [71-110] 87 (05/27 1103) Resp:  [14-20] 20 (05/27 1103) BP: (91-152)/(56-93) 124/84 (05/27 1103) SpO2:  [97 %-100 %] 98 % (05/27 1103) Weight:  [68 kg] 68 kg (05/26 1630) Constitutional:  Alert and oriented, no acute distress HEENT: [place] AT, moist mucus membranes, absent dentition Cardiovascular: No clubbing, cyanosis, or  edema Respiratory: Normal respiratory effort Skin: No rashes, bruises or suspicious lesions Neurologic: Grossly intact, no focal deficits, moving all 4 extremities Psychiatric: Normal mood and affect  Laboratory Data:  Recent Labs    02/11/21 1057 02/12/21 0428  WBC 9.9 5.7  HGB 10.9* 9.2*  HCT 32.0* 26.8*   Recent Labs    02/11/21 1057 02/11/21 2104 02/12/21 0428  NA 134* 134* 136  K 4.7 4.9 4.2  CL 105 104 106  CO2 11* 15* 16*  GLUCOSE 120* 138* 122*  BUN 112* 108* 100*  CREATININE 6.95*  6.80* 5.72*  CALCIUM 8.8* 9.1 8.3*   Urinalysis    Component Value Date/Time   COLORURINE YELLOW (A) 02/11/2021 2104   APPEARANCEUR HAZY (A) 02/11/2021 2104   APPEARANCEUR Hazy (A) 07/21/2020 0839   LABSPEC 1.012 02/11/2021 2104   LABSPEC 1.027 08/09/2014 1559   PHURINE 5.0 02/11/2021 2104   GLUCOSEU 50 (A) 02/11/2021 2104   GLUCOSEU see comment 08/09/2014 1559   HGBUR NEGATIVE 02/11/2021 2104   BILIRUBINUR NEGATIVE 02/11/2021 2104   BILIRUBINUR Negative 07/21/2020 0839   BILIRUBINUR see comment 08/09/2014 [place] 02/11/2021 2104   PROTEINUR NEGATIVE 02/11/2021 2104   UROBILINOGEN 0.2 02/20/2017 1615   NITRITE NEGATIVE 02/11/2021 2104   LEUKOCYTESUR NEGATIVE 02/11/2021 2104   LEUKOCYTESUR see comment 08/09/2014 1559   Results for orders placed or performed during the hospital encounter of 02/11/21  Resp Panel by RT-PCR (Flu A&B, Covid) Urine, Catheterized     Status: Abnormal   Collection Time: 02/11/21 10:41 PM   Specimen: Urine, Catheterized; Nasopharyngeal(NP) swabs in vial transport medium  Result Value Ref Range Status   SARS Coronavirus 2 by RT PCR POSITIVE (A) NEGATIVE Final    Comment: RESULT CALLED TO, READ BACK BY AND VERIFIED WITH: Leonides Schanz RN 0006 02/12/21 HNM (NOTE) SARS-CoV-2 target nucleic acids are DETECTED.  The SARS-CoV-2 RNA is generally detectable in upper respiratory specimens during the acute phase of infection. Positive results are indicative of the presence of the identified virus, but do not rule out bacterial infection or co-infection with other pathogens not detected by the test. Clinical correlation with patient history and other diagnostic information is necessary to determine patient infection status. The expected result is Negative.  Fact Sheet for Patients: EntrepreneurPulse.com.au  Fact Sheet for Healthcare Providers: IncredibleEmployment.be  This test is not yet approved or  cleared by the [place] FDA and  has been authorized for detection and/or diagnosis of SARS-CoV-2 by FDA under an Emergency Use Authorization (EUA).  This EUA will remain in effect (meaning this test can be  used) for the duration of  the COVID-19 declaration under Section 564(b)(1) of the Act, 21 U.S.C. section 360bbb-3(b)(1), unless the authorization is terminated or revoked sooner.     Influenza A by PCR NEGATIVE NEGATIVE Final   Influenza B by PCR NEGATIVE NEGATIVE Final    Comment: (NOTE) The Xpert Xpress SARS-CoV-2/FLU/RSV plus assay is intended as an aid in the diagnosis of influenza from Nasopharyngeal swab specimens and should not be used as a sole basis for treatment. Nasal washings and aspirates are unacceptable for Xpert Xpress SARS-CoV-2/FLU/RSV testing.  Fact Sheet for Patients: EntrepreneurPulse.com.au  Fact Sheet for Healthcare Providers: IncredibleEmployment.be  This test is not yet approved or cleared by the [place] FDA and has been authorized for detection and/or diagnosis of SARS-CoV-2 by FDA under an Emergency Use Authorization (EUA). This EUA will remain in effect (meaning this test can be used) for  the duration of the COVID-19 declaration under Section 564(b)(1) of the Act, 21 U.S.C. section 360bbb-3(b)(1), unless the authorization is terminated or revoked.  Performed at [facility], [address] [place], [place] [address]     Radiologic Imaging: CT Renal Stone Study  Result Date: 02/11/2021 CLINICAL DATA:  Hematuria. EXAM: CT ABDOMEN AND PELVIS WITHOUT CONTRAST TECHNIQUE: Multidetector CT imaging of the abdomen and pelvis was performed following the standard protocol without IV contrast. COMPARISON:  11/16/2016 FINDINGS: Lower chest: Patchy and nodular airspace disease noted right lower lobe. No pleural effusion. Hepatobiliary: No focal abnormality in the liver on this study without intravenous  contrast. 9 mm calcified gallstone evident. No intrahepatic or extrahepatic biliary dilation. Pancreas: No focal mass lesion. No dilatation of the main duct. No intraparenchymal cyst. No peripancreatic edema. Spleen: No splenomegaly. No focal mass lesion. Adrenals/Urinary Tract: Stable 17 mm left adrenal adenoma. Right adrenal gland unremarkable. No stones are seen in either kidney or ureter. No secondary changes in either kidney or ureter. Bladder is distended with mass-effect on the bladder base presumably related to median lobe hypertrophy of the prostate gland. Small bladder diverticuli noted. Stomach/Bowel: Stomach is unremarkable. No gastric wall thickening. No evidence of outlet obstruction. Duodenum is normally positioned as is the ligament of Treitz. No small bowel wall thickening. No small bowel dilatation. The terminal ileum is normal. The appendix is normal. No gross colonic mass. No colonic wall thickening. Diverticular changes are noted in the left colon without evidence of diverticulitis. Vascular/Lymphatic: There is abdominal aortic atherosclerosis without aneurysm. There is no gastrohepatic or hepatoduodenal ligament lymphadenopathy. No retroperitoneal or mesenteric lymphadenopathy. No pelvic sidewall lymphadenopathy. Reproductive: Prostate gland is enlarged. Other: No intraperitoneal free fluid. Musculoskeletal: No worrisome lytic or sclerotic osseous abnormality. IMPRESSION: 1. Patchy and nodular airspace disease right lower lobe. Imaging features are compatible with pneumonia, possibly atypical etiology. Follow-up CT chest without contrast in 3 months could be used to ensure resolution. 2. Cholelithiasis. 3. Stable 17 mm left adrenal adenoma. 4. Left colonic diverticulosis without diverticulitis. 5. Prostatomegaly. Associated bladder diverticuli raise the question of underlying component of bladder outlet obstruction. 6. Aortic Atherosclerosis (ICD10-I70.0). Electronically Signed   By: Misty Stanley M.D.   On: 02/11/2021 21:30   Assessment & Plan:  70 year old comorbid male with a history of elevated PSA awaiting biopsy, hematuria, BPH on finasteride, and left adrenal adenoma admitted with incidental acute renal failure, a distended urinary bladder consistent with bladder outlet obstruction, and asymptomatic COVID-19 infection.  Creatinine is downtrending with Foley catheter in place.  Admission UA is benign, low concern for infection today.  Agree with Foley catheter for urinary decompression and supportive care for management of acute renal failure.  We will need to delay his upcoming in-office prostate biopsy in the setting of COVID infection. Additionally, I would ideally like to have him pass a voiding trial prior to reduce his risk of infection. Recommend starting Flomax with plans for outpatient voiding trial and subsequent biopsy.  Recommendations: -Start tamsulosin 0.4 mg daily -Daily BMP to trend creatinine -Continue Foley catheter x7-10 days with plans for outpatient voiding trial (scheduled) -Reschedule upcoming prostate biopsy given indwelling Foley catheter for 1 week following biopsy (scheduled)  Thank you for involving me in this patient's care, please page with any further questions or concerns.  Debroah Loop, PA-C 02/12/2021 12:06 PM

## 2021-02-12 NOTE — Consult Note (Signed)
[facility]  CONSULT NOTE    Date: 02/12/2021                  Patient Name:  Scott Woods  MRN: [id number]  DOB: 1951/08/05  Age / Sex: 70 y.o., male         PCP: [place]                 Service Requesting Consult: [facility]                 Reason for Consult: AKI            History of Present Illness: Scott Woods is a 70 y.o.  male with medical history including BPH, diabetes, hypertension and CKD stage 2 who was admitted to [facility] on 02/11/2021 for acute kidney injury.   We have been consulted to evaluate kidney injury. Creatinine was elevated to 6.8 at admission. Patient states he has had poor appetite for a couple weeks. This includes both eating and drinking. Denies use of NSAIDs.    Medications: Outpatient medications: (Not in a hospital admission)   Current medications: Current Facility-Administered Medications  Medication Dose Route Frequency Provider Last Rate Last Admin  . 0.9 %  sodium chloride infusion   Intravenous Continuous Nolberto Hanlon, MD      . acetaminophen (TYLENOL) tablet 650 mg  650 mg Oral Q6H PRN Athena Masse, MD       Or  . acetaminophen (TYLENOL) suppository 650 mg  650 mg Rectal Q6H PRN Athena Masse, MD      . heparin injection 5,000 Units  5,000 Units Subcutaneous Q8H Athena Masse, MD   5,000 Units at 02/12/21 0505  . HYDROcodone-acetaminophen (NORCO/VICODIN) 5-325 MG per tablet 1-2 tablet  1-2 tablet Oral Q4H PRN Athena Masse, MD      . insulin aspart (novoLOG) injection 0-5 Units  0-5 Units Subcutaneous QHS Judd Gaudier V, MD      . insulin aspart (novoLOG) injection 0-9 Units  0-9 Units Subcutaneous TID WC Athena Masse, MD      . ondansetron [facility]) tablet 4 mg  4 mg Oral Q6H PRN Athena Masse, MD       Or  . ondansetron [facility]) injection 4 mg  4 mg Intravenous Q6H PRN Judd Gaudier V, MD      . sodium bicarbonate 150 mEq in sterile water 1,150 mL infusion   Intravenous  Continuous Duffy Bruce, MD 125 mL/hr at 02/12/21 0847 New Bag at 02/12/21 0847   Current Outpatient Medications  Medication Sig Dispense Refill  . aspirin EC 81 MG tablet Take 1 tablet (81 mg total) by mouth daily.    Marland Kitchen atorvastatin (LIPITOR) 40 MG tablet TAKE 1 TABLET BY MOUTH EVERYDAY AT BEDTIME 90 tablet 3  . ciprofloxacin (CIPRO) 500 MG tablet Take 1 tablet by mouth 2 (two) times daily.    . finasteride (PROSCAR) 5 MG tablet TAKE 1 TABLET BY MOUTH EVERY DAY 90 tablet 3  . lisinopril (ZESTRIL) 10 MG tablet Take 1 tablet (10 mg total) by mouth daily. 90 tablet 3  . metFORMIN (GLUCOPHAGE) 500 MG tablet Take 2 tablets twice daily with meals 180 tablet 3      Allergies: No Known Allergies    Past Medical History: Past Medical History:  Diagnosis Date  . Adrenal nodule ([place])   . Blood in the urine 05/08/2015  . Borderline diabetes   .  Chronic kidney disease   . CKD (chronic kidney disease), stage II   . Diabetes mellitus without complication ([place])   . Elevated PSA   . Gross hematuria   . H/O type B viral hepatitis 05/08/2015   When he was a child   . History of hepatitis B   . HTN (hypertension)   . Hyperlipidemia with target LDL less than 100   . Obesity   . Pre-ulcerative calluses 03/28/2016   great toe   . Prostate cancer [facility])      Past Surgical History: Past Surgical History:  Procedure Laterality Date  . ADENOIDECTOMY    . COLONOSCOPY  11/18/2014  . PROCTOSCOPY  04/2014  . TONSILLECTOMY       Family History: Family History  Problem Relation Age of Onset  . Parkinson's disease Brother   . Heart disease Mother   . Diabetes Mellitus II Mother   . Coronary artery disease Mother   . Heart disease Father   . Prostate cancer Neg Hx   . Kidney cancer Neg Hx   . Bladder Cancer Neg Hx      Social History: Social History   Socioeconomic History  . Marital status: Single    Spouse name: Not on file  . Number of children: Not on file  . Years of  education: Not on file  . Highest education level: Not on file  Occupational History  . Not on file  Tobacco Use  . Smoking status: Never Smoker  . Smokeless tobacco: Never Used  Vaping Use  . Vaping Use: Never used  Substance and Sexual Activity  . Alcohol use: No    Alcohol/week: 0.0 standard drinks  . Drug use: No  . Sexual activity: Not Currently  Other Topics Concern  . Not on file  Social History Narrative  . Not on file   Social Determinants of Health   Financial Resource Strain: Not on file  Food Insecurity: Not on file  Transportation Needs: Not on file  Physical Activity: Not on file  Stress: Not on file  Social Connections: Not on file  Intimate Partner Violence: Not on file     Review of Systems: Review of Systems  Gastrointestinal: Positive for nausea and vomiting.  Neurological: Positive for weakness.  All other systems reviewed and are negative.   Vital Signs: Blood pressure (!) 106/56, pulse 88, temperature (!) 97.3 F (36.3 C), temperature source Oral, resp. rate 18, height 5\' 10"  (1.778 m), weight 68 kg, SpO2 98 %.  Weight trends: Filed Weights   02/11/21 1630  Weight: 68 kg    Physical Exam: General: NAD, laying on stretcher  Head: Normocephalic, atraumatic. Moist oral mucosal membranes  Eyes: Anicteric  Lungs:  Diminished in bases, normal breathing effort  Heart: Regular rate and rhythm  Abdomen:  Soft, nontender,   Extremities:  no peripheral edema.  Neurologic: Nonfocal, moving all four extremities  Skin: No lesions        Lab results: Basic Metabolic Panel: Recent Labs  Lab 02/11/21 1057 02/11/21 2104 02/12/21 0428  NA 134* 134* 136  K 4.7 4.9 4.2  CL 105 104 106  CO2 11* 15* 16*  GLUCOSE 120* 138* 122*  BUN 112* 108* 100*  CREATININE 6.95* 6.80* 5.72*  CALCIUM 8.8* 9.1 8.3*  MG  --  1.9  --     Liver Function Tests: Recent Labs  Lab 02/11/21 1057  AST 13*  ALT 10  ALKPHOS 62  BILITOT 1.2  PROT  7.7   ALBUMIN 3.5   No results for input(s): LIPASE, AMYLASE in the last 168 hours. No results for input(s): AMMONIA in the last 168 hours.  CBC: Recent Labs  Lab 02/11/21 1057 02/12/21 0428  WBC 9.9 5.7  NEUTROABS 8.3*  --   HGB 10.9* 9.2*  HCT 32.0* 26.8*  MCV 88.6 87.3  PLT 241 195    Cardiac Enzymes: No results for input(s): CKTOTAL, CKMB, CKMBINDEX, TROPONINI in the last 168 hours.  BNP: Invalid input(s): POCBNP  CBG: Recent Labs  Lab 02/12/21 0754  GLUCAP 117*    Microbiology: Results for orders placed or performed during the hospital encounter of 02/11/21  Resp Panel by RT-PCR (Flu A&B, Covid) Urine, Catheterized     Status: Abnormal   Collection Time: 02/11/21 10:41 PM   Specimen: Urine, Catheterized; Nasopharyngeal(NP) swabs in vial transport medium  Result Value Ref Range Status   SARS Coronavirus 2 by RT PCR POSITIVE (A) NEGATIVE Final    Comment: RESULT CALLED TO, READ BACK BY AND VERIFIED WITH: Leonides Schanz RN 0006 02/12/21 HNM (NOTE) SARS-CoV-2 target nucleic acids are DETECTED.  The SARS-CoV-2 RNA is generally detectable in upper respiratory specimens during the acute phase of infection. Positive results are indicative of the presence of the identified virus, but do not rule out bacterial infection or co-infection with other pathogens not detected by the test. Clinical correlation with patient history and other diagnostic information is necessary to determine patient infection status. The expected result is Negative.  Fact Sheet for Patients: EntrepreneurPulse.com.au  Fact Sheet for Healthcare Providers: IncredibleEmployment.be  This test is not yet approved or cleared by the [place] FDA and  has been authorized for detection and/or diagnosis of SARS-CoV-2 by FDA under an Emergency Use Authorization (EUA).  This EUA will remain in effect (meaning this test can be  used) for the duration of  the COVID-19  declaration under Section 564(b)(1) of the Act, 21 U.S.C. section 360bbb-3(b)(1), unless the authorization is terminated or revoked sooner.     Influenza A by PCR NEGATIVE NEGATIVE Final   Influenza B by PCR NEGATIVE NEGATIVE Final    Comment: (NOTE) The Xpert Xpress SARS-CoV-2/FLU/RSV plus assay is intended as an aid in the diagnosis of influenza from Nasopharyngeal swab specimens and should not be used as a sole basis for treatment. Nasal washings and aspirates are unacceptable for Xpert Xpress SARS-CoV-2/FLU/RSV testing.  Fact Sheet for Patients: EntrepreneurPulse.com.au  Fact Sheet for Healthcare Providers: IncredibleEmployment.be  This test is not yet approved or cleared by the [place] FDA and has been authorized for detection and/or diagnosis of SARS-CoV-2 by FDA under an Emergency Use Authorization (EUA). This EUA will remain in effect (meaning this test can be used) for the duration of the COVID-19 declaration under Section 564(b)(1) of the Act, 21 U.S.C. section 360bbb-3(b)(1), unless the authorization is terminated or revoked.  Performed at [facility], [address] [place], [place] [address]     Coagulation Studies: No results for input(s): LABPROT, INR in the last 72 hours.  Urinalysis: Recent Labs    02/11/21 2104  COLORURINE YELLOW*  LABSPEC 1.012  PHURINE 5.0  GLUCOSEU 50*  HGBUR NEGATIVE  BILIRUBINUR NEGATIVE  KETONESUR NEGATIVE  PROTEINUR NEGATIVE  NITRITE NEGATIVE  LEUKOCYTESUR NEGATIVE      Imaging: DG Chest Portable 1 View  Result Date: 02/11/2021 CLINICAL DATA:  Cough EXAM: PORTABLE CHEST 1 VIEW COMPARISON:  10/10/2019 FINDINGS: The heart size and mediastinal contours are within normal limits.  Both lungs are clear. Chronic left upper rib deformities. IMPRESSION: No active disease. Electronically Signed   By: Donavan Foil M.D.   On: 02/11/2021 22:49   CT Renal Stone Study  Result  Date: 02/11/2021 CLINICAL DATA:  Hematuria. EXAM: CT ABDOMEN AND PELVIS WITHOUT CONTRAST TECHNIQUE: Multidetector CT imaging of the abdomen and pelvis was performed following the standard protocol without IV contrast. COMPARISON:  11/16/2016 FINDINGS: Lower chest: Patchy and nodular airspace disease noted right lower lobe. No pleural effusion. Hepatobiliary: No focal abnormality in the liver on this study without intravenous contrast. 9 mm calcified gallstone evident. No intrahepatic or extrahepatic biliary dilation. Pancreas: No focal mass lesion. No dilatation of the main duct. No intraparenchymal cyst. No peripancreatic edema. Spleen: No splenomegaly. No focal mass lesion. Adrenals/Urinary Tract: Stable 17 mm left adrenal adenoma. Right adrenal gland unremarkable. No stones are seen in either kidney or ureter. No secondary changes in either kidney or ureter. Bladder is distended with mass-effect on the bladder base presumably related to median lobe hypertrophy of the prostate gland. Small bladder diverticuli noted. Stomach/Bowel: Stomach is unremarkable. No gastric wall thickening. No evidence of outlet obstruction. Duodenum is normally positioned as is the ligament of Treitz. No small bowel wall thickening. No small bowel dilatation. The terminal ileum is normal. The appendix is normal. No gross colonic mass. No colonic wall thickening. Diverticular changes are noted in the left colon without evidence of diverticulitis. Vascular/Lymphatic: There is abdominal aortic atherosclerosis without aneurysm. There is no gastrohepatic or hepatoduodenal ligament lymphadenopathy. No retroperitoneal or mesenteric lymphadenopathy. No pelvic sidewall lymphadenopathy. Reproductive: Prostate gland is enlarged. Other: No intraperitoneal free fluid. Musculoskeletal: No worrisome lytic or sclerotic osseous abnormality. IMPRESSION: 1. Patchy and nodular airspace disease right lower lobe. Imaging features are compatible with  pneumonia, possibly atypical etiology. Follow-up CT chest without contrast in 3 months could be used to ensure resolution. 2. Cholelithiasis. 3. Stable 17 mm left adrenal adenoma. 4. Left colonic diverticulosis without diverticulitis. 5. Prostatomegaly. Associated bladder diverticuli raise the question of underlying component of bladder outlet obstruction. 6. Aortic Atherosclerosis (ICD10-I70.0). Electronically Signed   By: Misty Stanley M.D.   On: 02/11/2021 21:30      Assessment & Plan: Scott Woods is a 70 y.o.  male with with medical history including BPH, diabetes, hypertension and CKD stage 2  who was admitted to [facility] on 02/11/2021 for acute kidney injury.   1. Acute Kidney Injury on chronic kidney disease stage 3 with baseline creatinine 1.48 on 10/02/20.  Acute kidney injury likely secondary to medial lobe hypertrophy of prostate gland seen on CT Lisinpril currently held Renal ultrasound ordered to evaluate obstruction No IV contrast exposure No acute need for dialysis Continue IVF and encourage oral intake Will monitor renal function If no improvement, will consider acute dialysis  2. Anemia of chronic disease  Lab Results  Component Value Date   HGB 9.2 (L) 02/12/2021  Below target Will monitor  3. Diabetes mellitus type II with chronic kidney disease  Noninsulin dependent. Most recent hemoglobin A1c is 6.4 on 02/11/21.  Metformin currently held    LOS: 1 [place] 5/27/20229:51 AM

## 2021-02-12 NOTE — Progress Notes (Signed)
Patient ID: Scott Woods, male   DOB: 1950-10-15, 70 y.o.   MRN: [id number] This is a no charge note as patient was admitted this a.m. chart reviewed Scott Woods is a 70 y.o. male with medical history significant for BPH with lower urinary obstructive symptoms currently undergoing evaluation by urology, elevated PSA insulin-dependent type 2 diabetes, HTN and stage II CKD, who was sent from his PCPs office for abnormal kidney function.  Patient had been complaining of generalized weakness and weight loss of over 20 pounds for the past 3 months and prostate biopsy was being arranged.  He has nonproductive cough but denies shortness of breath or chest pain and denies fever or chills.  Has no nausea, vomiting, abdominal pain or change in bowel habits. ED course: On arrival afebrile, BP 99/69 with pulse of 110 O2 sat 100% on room air blood work significant for creatinine of 6.8 above baseline of 1.6 just 4 months prior.  Anion gap was 18 and bicarb 11.  WBC normal hemoglobin 10.9 which was his baseline.CT renal stone study with prostatomegaly and bladder diverticuli, possible underlying component of bladder outlet obstruction Patchy and nodular airspace disease right lower lobe  Foley catheter placed in the ER showed a postvoid residual of over 300.  Patient was started on a bicarb infusion.  Hospitalist consulted for admission  A/P: Will start IVF for bp support Urology consulted Nephrology following

## 2021-02-12 NOTE — H&P (Signed)
History and Physical    [name]:[id number] DOB: [birth year] DOA: 02/11/2021  PCP: [facility]   Patient coming from: Home  I have personally briefly reviewed patient's old medical records in [place]  Chief Complaint: Weakness, abnormal labs  HPI: Scott Woods is a 70 y.o. male with medical history significant for BPH with lower urinary obstructive symptoms currently undergoing evaluation by urology, elevated PSA insulin-dependent type 2 diabetes, HTN and stage II CKD, who was sent from his PCPs office for abnormal kidney function.  Patient had been complaining of generalized weakness and weight loss of over 20 pounds for the past 3 months and prostate biopsy was being arranged.  He has nonproductive cough but denies shortness of breath or chest pain and denies fever or chills.  Has no nausea, vomiting, abdominal pain or change in bowel habits. ED course: On arrival afebrile, BP 99/69 with pulse of 110 O2 sat 100% on room air blood work significant for creatinine of 6.8 above baseline of 1.6 just 4 months prior.  Anion gap was 18 and bicarb 11.  WBC normal hemoglobin 10.9 which was his baseline. EKG, personally viewed and interpreted: Sinus tachycardia at 115 with no acute ST-T wave changes Imaging: CT renal stone study with prostatomegaly and bladder diverticuli, possible underlying component of bladder outlet obstruction Patchy and nodular airspace disease right lower lobe  Foley catheter placed in the ER showed a postvoid residual of over 300.  Patient was started on a bicarb infusion.  Hospitalist consulted for admission.  Review of Systems: As per HPI otherwise all other systems on review of systems negative.    Past Medical History:  Diagnosis Date  . Adrenal nodule ([place])   . Blood in the urine 05/08/2015  . Borderline diabetes   . Chronic kidney disease   . CKD (chronic kidney disease), stage II   . Diabetes mellitus without complication  ([place])   . Elevated PSA   . Gross hematuria   . H/O type B viral hepatitis 05/08/2015   When he was a child   . History of hepatitis B   . HTN (hypertension)   . Hyperlipidemia with target LDL less than 100   . Obesity   . Pre-ulcerative calluses 03/28/2016   great toe   . Prostate cancer [facility])     Past Surgical History:  Procedure Laterality Date  . ADENOIDECTOMY    . COLONOSCOPY  11/18/2014  . PROCTOSCOPY  04/2014  . TONSILLECTOMY       reports that he has never smoked. He has never used smokeless tobacco. He reports that he does not drink alcohol and does not use drugs.  No Known Allergies  Family History  Problem Relation Age of Onset  . Parkinson's disease Brother   . Heart disease Mother   . Diabetes Mellitus II Mother   . Coronary artery disease Mother   . Heart disease Father   . Prostate cancer Neg Hx   . Kidney cancer Neg Hx   . Bladder Cancer Neg Hx       Prior to Admission medications   Medication Sig Start Date End Date Taking? Authorizing Provider  aspirin EC 81 MG tablet Take 1 tablet (81 mg total) by mouth daily. 04/02/16   Arnetha Courser, MD  atorvastatin (LIPITOR) 40 MG tablet TAKE 1 TABLET BY MOUTH EVERYDAY AT BEDTIME 10/02/20   [name], PA-C  ciprofloxacin (CIPRO) 500 MG tablet Take 1 tablet by mouth 2 (  two) times daily. 04/03/14   [provider]  finasteride (PROSCAR) 5 MG tablet TAKE 1 TABLET BY MOUTH EVERY DAY 03/10/20   McGowan, Larene Beach A, PA-C  lisinopril (ZESTRIL) 10 MG tablet Take 1 tablet (10 mg total) by mouth daily. 10/02/20   [name], PA-C  metFORMIN (GLUCOPHAGE) 500 MG tablet Take 2 tablets twice daily with meals 10/02/20   [name], PA-C    Physical Exam: Vitals:   02/11/21 2200 02/11/21 2204 02/11/21 2333 02/12/21 0026  BP:  (!) 152/76 100/61 118/74  Pulse: 98 (!) 102 (!) 102 92  Resp:  20 17 16   Temp:    98.3 F (36.8 C)  TempSrc:    Oral  SpO2: 100% 100% 98% 99%  Weight:      Height:         Vitals:    02/11/21 2200 02/11/21 2204 02/11/21 2333 02/12/21 0026  BP:  (!) 152/76 100/61 118/74  Pulse: 98 (!) 102 (!) 102 92  Resp:  20 17 16   Temp:    98.3 F (36.8 C)  TempSrc:    Oral  SpO2: 100% 100% 98% 99%  Weight:      Height:          Constitutional: Alert and oriented x 3 . Not in any apparent distress HEENT:      Head: Normocephalic and atraumatic.         Eyes: PERLA, EOMI, Conjunctivae are normal. Sclera is non-icteric.       Mouth/Throat: Mucous membranes are moist.       Neck: Supple with no signs of meningismus. Cardiovascular: Regular rate and rhythm. No murmurs, gallops, or rubs. 2+ symmetrical distal pulses are present . No JVD. No LE edema Respiratory: Respiratory effort normal .Lungs sounds clear bilaterally. No wheezes, crackles, or rhonchi.  Gastrointestinal: Soft, non tender, and non distended with positive bowel sounds.  Genitourinary: foley. Musculoskeletal: Nontender with normal range of motion in all extremities. No cyanosis, or erythema of extremities. Neurologic:  Face is symmetric. Moving all extremities. No gross focal neurologic deficits . Skin: Skin is warm, dry.  No rash or ulcers Psychiatric: Mood and affect are normal    Labs on Admission: I have personally reviewed following labs and imaging studies  CBC: Recent Labs  Lab 02/11/21 1057  WBC 9.9  NEUTROABS 8.3*  HGB 10.9*  HCT 32.0*  MCV 88.6  PLT 539   Basic Metabolic Panel: Recent Labs  Lab 02/11/21 1057 02/11/21 2104  NA 134* 134*  K 4.7 4.9  CL 105 104  CO2 11* 15*  GLUCOSE 120* 138*  BUN 112* 108*  CREATININE 6.95* 6.80*  CALCIUM 8.8* 9.1  MG  --  1.9   GFR: Estimated Creatinine Clearance: 10 mL/min (A) (by C-G formula based on SCr of 6.8 mg/dL (H)). Liver Function Tests: Recent Labs  Lab 02/11/21 1057  AST 13*  ALT 10  ALKPHOS 62  BILITOT 1.2  PROT 7.7  ALBUMIN 3.5   No results for input(s): LIPASE, AMYLASE in the last 168 hours. No results for input(s): AMMONIA  in the last 168 hours. Coagulation Profile: No results for input(s): INR, PROTIME in the last 168 hours. Cardiac Enzymes: No results for input(s): CKTOTAL, CKMB, CKMBINDEX, TROPONINI in the last 168 hours. BNP (last 3 results) No results for input(s): PROBNP in the last 8760 hours. HbA1C: Recent Labs    02/11/21 1058  HGBA1C 6.4*   CBG: No results for input(s): GLUCAP in the last 168  hours. Lipid Profile: Recent Labs    02/11/21 1057  CHOL 136  HDL 38*  LDLCALC 63  TRIG 177*  CHOLHDL 3.6   Thyroid Function Tests: Recent Labs    02/11/21 1057  TSH 1.815   Anemia Panel: No results for input(s): VITAMINB12, FOLATE, FERRITIN, TIBC, IRON, RETICCTPCT in the last 72 hours. Urine analysis:    Component Value Date/Time   COLORURINE YELLOW (A) 02/11/2021 2104   APPEARANCEUR HAZY (A) 02/11/2021 2104   APPEARANCEUR Hazy (A) 07/21/2020 0839   LABSPEC 1.012 02/11/2021 2104   LABSPEC 1.027 08/09/2014 1559   PHURINE 5.0 02/11/2021 2104   GLUCOSEU 50 (A) 02/11/2021 2104   GLUCOSEU see comment 08/09/2014 1559   HGBUR NEGATIVE 02/11/2021 2104   BILIRUBINUR NEGATIVE 02/11/2021 2104   BILIRUBINUR Negative 07/21/2020 0839   BILIRUBINUR see comment 08/09/2014 [place] 02/11/2021 2104   PROTEINUR NEGATIVE 02/11/2021 2104   UROBILINOGEN 0.2 02/20/2017 1615   NITRITE NEGATIVE 02/11/2021 2104   LEUKOCYTESUR NEGATIVE 02/11/2021 2104   LEUKOCYTESUR see comment 08/09/2014 1559    Radiological Exams on Admission: DG Chest Portable 1 View  Result Date: 02/11/2021 CLINICAL DATA:  Cough EXAM: PORTABLE CHEST 1 VIEW COMPARISON:  10/10/2019 FINDINGS: The heart size and mediastinal contours are within normal limits. Both lungs are clear. Chronic left upper rib deformities. IMPRESSION: No active disease. Electronically Signed   By: Donavan Foil M.D.   On: 02/11/2021 22:49   CT Renal Stone Study  Result Date: 02/11/2021 CLINICAL DATA:  Hematuria. EXAM: CT ABDOMEN AND PELVIS  WITHOUT CONTRAST TECHNIQUE: Multidetector CT imaging of the abdomen and pelvis was performed following the standard protocol without IV contrast. COMPARISON:  11/16/2016 FINDINGS: Lower chest: Patchy and nodular airspace disease noted right lower lobe. No pleural effusion. Hepatobiliary: No focal abnormality in the liver on this study without intravenous contrast. 9 mm calcified gallstone evident. No intrahepatic or extrahepatic biliary dilation. Pancreas: No focal mass lesion. No dilatation of the main duct. No intraparenchymal cyst. No peripancreatic edema. Spleen: No splenomegaly. No focal mass lesion. Adrenals/Urinary Tract: Stable 17 mm left adrenal adenoma. Right adrenal gland unremarkable. No stones are seen in either kidney or ureter. No secondary changes in either kidney or ureter. Bladder is distended with mass-effect on the bladder base presumably related to median lobe hypertrophy of the prostate gland. Small bladder diverticuli noted. Stomach/Bowel: Stomach is unremarkable. No gastric wall thickening. No evidence of outlet obstruction. Duodenum is normally positioned as is the ligament of Treitz. No small bowel wall thickening. No small bowel dilatation. The terminal ileum is normal. The appendix is normal. No gross colonic mass. No colonic wall thickening. Diverticular changes are noted in the left colon without evidence of diverticulitis. Vascular/Lymphatic: There is abdominal aortic atherosclerosis without aneurysm. There is no gastrohepatic or hepatoduodenal ligament lymphadenopathy. No retroperitoneal or mesenteric lymphadenopathy. No pelvic sidewall lymphadenopathy. Reproductive: Prostate gland is enlarged. Other: No intraperitoneal free fluid. Musculoskeletal: No worrisome lytic or sclerotic osseous abnormality. IMPRESSION: 1. Patchy and nodular airspace disease right lower lobe. Imaging features are compatible with pneumonia, possibly atypical etiology. Follow-up CT chest without contrast in 3  months could be used to ensure resolution. 2. Cholelithiasis. 3. Stable 17 mm left adrenal adenoma. 4. Left colonic diverticulosis without diverticulitis. 5. Prostatomegaly. Associated bladder diverticuli raise the question of underlying component of bladder outlet obstruction. 6. Aortic Atherosclerosis (ICD10-I70.0). Electronically Signed   By: Misty Stanley M.D.   On: 02/11/2021 21:30     Assessment/Plan 70 year old male with  history of BPH with lower urinary obstructive symptoms currently undergoing evaluation by urology, elevated PSA insulin-dependent type 2 diabetes, HTN and stage II CKD, who was sent from his PCPs office for weakness and abnormal kidney function.     Acute kidney injury superimposed on CKD II (HCC)   Increased anion gap metabolic acidosis   BPH with obstruction/lower urinary tract symptoms - Suspect combination of post renal, as well as prerenal given recent history of weight loss and poor oral intake -CT renal stone study with prostatomegaly and bladder diverticuli, possible underlying component of bladder outlet obstruction - IV hydration with bicarb containing fluids - Continue Foley catheter - Consider urology consult in the a.m. - Renal consult    Generalized weakness/weight loss - Uncertain primary etiology - Chest x-ray with possible pneumonia and patient denies - Nutritionist consult   Hypertension, goal below 140/90 - Continue home meds    Diabetes mellitus type 2, controlled, without complications ([place]) - Sliding scale insulin coverage    Abnormal chest CT - Renal stone CT showed Patchy and nodular airspace disease right lower lobe - Patient symptomatic for cough but is afebrile and no leukocytosis and not short of breath - Follow procalcitonin.  If elevated consider starting antibiotics    DVT prophylaxis: Heparin Code Status: full code  Family Communication:  none  Disposition Plan: Back to previous home environment Consults called:  Nephrology Status:At the time of admission, it appears that the appropriate admission status for this patient is INPATIENT. This is judged to be reasonable and necessary in order to provide the required intensity of service to ensure the patient's safety given the presenting symptoms, physical exam findings, and initial radiographic and laboratory data in the context of their  Comorbid conditions.   Patient requires inpatient status due to high intensity of service, high risk for further deterioration and high frequency of surveillance required.   I certify that at the point of admission it is my clinical judgment that the patient will require inpatient hospital care spanning beyond [place] MD [facility]     02/12/2021, 4:18 AM

## 2021-02-12 NOTE — Progress Notes (Signed)
Pharmacy COVID-19 Monoclonal Antibody Screening  Scott Woods was identified as being not hospitalized with symptoms from Covid-19 on admission but an incidental positive PCR has been documented.  The patient may qualify for the use of monoclonal antibodies (mAB) for COVID-19 viral infection to prevent worsening symptoms stemming from Covid-19 infection.  The patient was identified based on a positive COVID-19 PCR and not requiring the use of supplemental oxygen at this time.  This patient meets the FDA criteria for Emergency Use Authorization of bebtelovimab  Has a (+) direct SARS-CoV-2 viral test result  Is NOT hospitalized due to COVID-19  Is within 10 days of symptom onset  Has at least one of the high risk factor(s) for progression to severe COVID-19 and/or hospitalization as defined in EUA.  Specific high risk criteria : Older age (>/= 69 yo), Chronic Kidney Disease (CKD), Diabetes and Cardiovascular disease or hypertension  Additionally: The patient has not had a positive COVID-19 PCR in the last 90 days.  The patient reports he is partially vaccinated ([facility] series without booster) against COVID-19.  Patient is eligible for inpatient MAB administration  This eligibility and indication for treatment was discussed with the patient's physician: Dr. Kurtis Bushman  Plan: Based on the above discussion, it was decided that the patient may receive MAB if desired. After discussion of risk / benefits with the patient, he has decided to not receive MAB at this time but would prefer to think about it. Patient aware to let MD know if he decides to proceed.   Benita Gutter 02/12/2021  2:23 PM

## 2021-02-12 NOTE — H&P (Signed)
History and Physical    QUINDARRIUS JOPLIN YCX:[id number] DOB: 07-Apr-1951 DOA: 02/11/2021  PCP: [facility]   Patient coming from: Home  I have personally briefly reviewed patient's old medical records in [place]  Chief Complaint: Weakness, abnormal labs  HPI: ROME ECHAVARRIA is a 70 y.o. male with medical history significant for BPH with lower urinary obstructive symptoms currently undergoing evaluation by urology, elevated PSA insulin-dependent type 2 diabetes, HTN and stage II CKD, who was sent from his PCPs office for abnormal kidney function.  Patient had been complaining of generalized weakness and weight loss of over 20 pounds for the past 3 months and prostate biopsy was being arranged.  He has nonproductive cough but denies shortness of breath or chest pain and denies fever or chills.  Has no nausea, vomiting, abdominal pain or change in bowel habits. ED course: On arrival afebrile, BP 99/69 with pulse of 110 O2 sat 100% on room air blood work significant for creatinine of 6.8 above baseline of 1.6 just 4 months prior.  Anion gap was 18 and bicarb 11.  WBC normal hemoglobin 10.9 which was his baseline. EKG, personally viewed and interpreted: Sinus tachycardia at 115 with no acute ST-T wave changes Imaging: CT renal stone study with prostatomegaly and bladder diverticuli, possible underlying component of bladder outlet obstruction Patchy and nodular airspace disease right lower lobe  Foley catheter placed in the ER showed a postvoid residual of over 300.  Patient was started on a bicarb infusion.  Hospitalist consulted for admission.  Review of Systems: As per HPI otherwise all other systems on review of systems negative.    Past Medical History:  Diagnosis Date  . Adrenal nodule ([place])   . Blood in the urine 05/08/2015  . Borderline diabetes   . Chronic kidney disease   . CKD (chronic kidney disease), stage II   . Diabetes mellitus without complication  ([place])   . Elevated PSA   . Gross hematuria   . H/O type B viral hepatitis 05/08/2015   When he was a child   . History of hepatitis B   . HTN (hypertension)   . Hyperlipidemia with target LDL less than 100   . Obesity   . Pre-ulcerative calluses 03/28/2016   great toe   . Prostate cancer [facility])     Past Surgical History:  Procedure Laterality Date  . ADENOIDECTOMY    . COLONOSCOPY  11/18/2014  . PROCTOSCOPY  04/2014  . TONSILLECTOMY       reports that he has never smoked. He has never used smokeless tobacco. He reports that he does not drink alcohol and does not use drugs.  No Known Allergies  Family History  Problem Relation Age of Onset  . Parkinson's disease Brother   . Heart disease Mother   . Diabetes Mellitus II Mother   . Coronary artery disease Mother   . Heart disease Father   . Prostate cancer Neg Hx   . Kidney cancer Neg Hx   . Bladder Cancer Neg Hx       Prior to Admission medications   Medication Sig Start Date End Date Taking? Authorizing Provider  aspirin EC 81 MG tablet Take 1 tablet (81 mg total) by mouth daily. 04/02/16   Arnetha Courser, MD  atorvastatin (LIPITOR) 40 MG tablet TAKE 1 TABLET BY MOUTH EVERYDAY AT BEDTIME 10/02/20   [name], PA-C  ciprofloxacin (CIPRO) 500 MG tablet Take 1 tablet by mouth 2 (  two) times daily. 04/03/14   [provider]  finasteride (PROSCAR) 5 MG tablet TAKE 1 TABLET BY MOUTH EVERY DAY 03/10/20   McGowan, Larene Beach A, PA-C  lisinopril (ZESTRIL) 10 MG tablet Take 1 tablet (10 mg total) by mouth daily. 10/02/20   [name], PA-C  metFORMIN (GLUCOPHAGE) 500 MG tablet Take 2 tablets twice daily with meals 10/02/20   [name], PA-C    Physical Exam: Vitals:   02/11/21 2058 02/11/21 2200 02/11/21 2204 02/11/21 2333  BP: (!) 137/93  (!) 152/76 100/61  Pulse: (!) 106 98 (!) 102 (!) 102  Resp: 20  20 17   Temp:      TempSrc:      SpO2: 98% 100% 100% 98%  Weight:      Height:         Vitals:   02/11/21  2058 02/11/21 2200 02/11/21 2204 02/11/21 2333  BP: (!) 137/93  (!) 152/76 100/61  Pulse: (!) 106 98 (!) 102 (!) 102  Resp: 20  20 17   Temp:      TempSrc:      SpO2: 98% 100% 100% 98%  Weight:      Height:          Constitutional: Alert and oriented x 3 . Not in any apparent distress HEENT:      Head: Normocephalic and atraumatic.         Eyes: PERLA, EOMI, Conjunctivae are normal. Sclera is non-icteric.       Mouth/Throat: Mucous membranes are moist.       Neck: Supple with no signs of meningismus. Cardiovascular: Regular rate and rhythm. No murmurs, gallops, or rubs. 2+ symmetrical distal pulses are present . No JVD. No LE edema Respiratory: Respiratory effort normal .Lungs sounds clear bilaterally. No wheezes, crackles, or rhonchi.  Gastrointestinal: Soft, non tender, and non distended with positive bowel sounds.  Genitourinary: No CVA tenderness. Musculoskeletal: Nontender with normal range of motion in all extremities. No cyanosis, or erythema of extremities. Neurologic:  Face is symmetric. Moving all extremities. No gross focal neurologic deficits . Skin: Skin is warm, dry.  No rash or ulcers Psychiatric: Mood and affect are normal    Labs on Admission: I have personally reviewed following labs and imaging studies  CBC: Recent Labs  Lab 02/11/21 1057  WBC 9.9  NEUTROABS 8.3*  HGB 10.9*  HCT 32.0*  MCV 88.6  PLT 287   Basic Metabolic Panel: Recent Labs  Lab 02/11/21 1057 02/11/21 2104  NA 134* 134*  K 4.7 4.9  CL 105 104  CO2 11* 15*  GLUCOSE 120* 138*  BUN 112* 108*  CREATININE 6.95* 6.80*  CALCIUM 8.8* 9.1  MG  --  1.9   GFR: Estimated Creatinine Clearance: 10 mL/min (A) (by C-G formula based on SCr of 6.8 mg/dL (H)). Liver Function Tests: Recent Labs  Lab 02/11/21 1057  AST 13*  ALT 10  ALKPHOS 62  BILITOT 1.2  PROT 7.7  ALBUMIN 3.5   No results for input(s): LIPASE, AMYLASE in the last 168 hours. No results for input(s): AMMONIA in the  last 168 hours. Coagulation Profile: No results for input(s): INR, PROTIME in the last 168 hours. Cardiac Enzymes: No results for input(s): CKTOTAL, CKMB, CKMBINDEX, TROPONINI in the last 168 hours. BNP (last 3 results) No results for input(s): PROBNP in the last 8760 hours. HbA1C: Recent Labs    02/11/21 1058  HGBA1C 6.4*   CBG: No results for input(s): GLUCAP in the last 168  hours. Lipid Profile: Recent Labs    02/11/21 1057  CHOL 136  HDL 38*  LDLCALC 63  TRIG 177*  CHOLHDL 3.6   Thyroid Function Tests: Recent Labs    02/11/21 1057  TSH 1.815   Anemia Panel: No results for input(s): VITAMINB12, FOLATE, FERRITIN, TIBC, IRON, RETICCTPCT in the last 72 hours. Urine analysis:    Component Value Date/Time   COLORURINE YELLOW (A) 02/11/2021 2104   APPEARANCEUR HAZY (A) 02/11/2021 2104   APPEARANCEUR Hazy (A) 07/21/2020 0839   LABSPEC 1.012 02/11/2021 2104   LABSPEC 1.027 08/09/2014 1559   PHURINE 5.0 02/11/2021 2104   GLUCOSEU 50 (A) 02/11/2021 2104   GLUCOSEU see comment 08/09/2014 1559   HGBUR NEGATIVE 02/11/2021 2104   BILIRUBINUR NEGATIVE 02/11/2021 2104   BILIRUBINUR Negative 07/21/2020 0839   BILIRUBINUR see comment 08/09/2014 [place] 02/11/2021 2104   PROTEINUR NEGATIVE 02/11/2021 2104   UROBILINOGEN 0.2 02/20/2017 1615   NITRITE NEGATIVE 02/11/2021 2104   LEUKOCYTESUR NEGATIVE 02/11/2021 2104   LEUKOCYTESUR see comment 08/09/2014 1559    Radiological Exams on Admission: DG Chest Portable 1 View  Result Date: 02/11/2021 CLINICAL DATA:  Cough EXAM: PORTABLE CHEST 1 VIEW COMPARISON:  10/10/2019 FINDINGS: The heart size and mediastinal contours are within normal limits. Both lungs are clear. Chronic left upper rib deformities. IMPRESSION: No active disease. Electronically Signed   By: Donavan Foil M.D.   On: 02/11/2021 22:49   CT Renal Stone Study  Result Date: 02/11/2021 CLINICAL DATA:  Hematuria. EXAM: CT ABDOMEN AND PELVIS WITHOUT  CONTRAST TECHNIQUE: Multidetector CT imaging of the abdomen and pelvis was performed following the standard protocol without IV contrast. COMPARISON:  11/16/2016 FINDINGS: Lower chest: Patchy and nodular airspace disease noted right lower lobe. No pleural effusion. Hepatobiliary: No focal abnormality in the liver on this study without intravenous contrast. 9 mm calcified gallstone evident. No intrahepatic or extrahepatic biliary dilation. Pancreas: No focal mass lesion. No dilatation of the main duct. No intraparenchymal cyst. No peripancreatic edema. Spleen: No splenomegaly. No focal mass lesion. Adrenals/Urinary Tract: Stable 17 mm left adrenal adenoma. Right adrenal gland unremarkable. No stones are seen in either kidney or ureter. No secondary changes in either kidney or ureter. Bladder is distended with mass-effect on the bladder base presumably related to median lobe hypertrophy of the prostate gland. Small bladder diverticuli noted. Stomach/Bowel: Stomach is unremarkable. No gastric wall thickening. No evidence of outlet obstruction. Duodenum is normally positioned as is the ligament of Treitz. No small bowel wall thickening. No small bowel dilatation. The terminal ileum is normal. The appendix is normal. No gross colonic mass. No colonic wall thickening. Diverticular changes are noted in the left colon without evidence of diverticulitis. Vascular/Lymphatic: There is abdominal aortic atherosclerosis without aneurysm. There is no gastrohepatic or hepatoduodenal ligament lymphadenopathy. No retroperitoneal or mesenteric lymphadenopathy. No pelvic sidewall lymphadenopathy. Reproductive: Prostate gland is enlarged. Other: No intraperitoneal free fluid. Musculoskeletal: No worrisome lytic or sclerotic osseous abnormality. IMPRESSION: 1. Patchy and nodular airspace disease right lower lobe. Imaging features are compatible with pneumonia, possibly atypical etiology. Follow-up CT chest without contrast in 3 months  could be used to ensure resolution. 2. Cholelithiasis. 3. Stable 17 mm left adrenal adenoma. 4. Left colonic diverticulosis without diverticulitis. 5. Prostatomegaly. Associated bladder diverticuli raise the question of underlying component of bladder outlet obstruction. 6. Aortic Atherosclerosis (ICD10-I70.0). Electronically Signed   By: Misty Stanley M.D.   On: 02/11/2021 21:30     Assessment/Plan 70 year old male with  history of BPH with lower urinary obstructive symptoms currently undergoing evaluation by urology, elevated PSA insulin-dependent type 2 diabetes, HTN and stage II CKD, who was sent from his PCPs office for weakness and abnormal kidney function.     Acute kidney injury superimposed on CKD II (HCC)   Increased anion gap metabolic acidosis   BPH with obstruction/lower urinary tract symptoms - Suspect combination of post renal, as well as prerenal given recent history of weight loss and poor oral intake -CT renal stone study with prostatomegaly and bladder diverticuli, possible underlying component of bladder outlet obstruction - IV hydration with bicarb containing fluids - Continue Foley catheter - Consider urology consult in the a.m. - Renal consult    Generalized weakness/weight loss - Uncertain primary etiology - Chest x-ray with possible pneumonia and patient does have a cough - Nutritionist consult   Hypertension, goal below 140/90 - Continue home meds    Diabetes mellitus type 2, controlled, without complications (HCC) - Sliding scale insulin coverage    Abnormal chest CT - Renal stone CT showed Patchy and nodular airspace disease right lower lobe - Patient symptomatic for cough but is afebrile and no leukocytosis and not short of breath - Follow procalcitonin.  If elevated consider starting antibiotics    DVT prophylaxis: Heparin Code Status: full code  Family Communication:  none  Disposition Plan: Back to previous home environment Consults called:  Nephrology Status:At the time of admission, it appears that the appropriate admission status for this patient is INPATIENT. This is judged to be reasonable and necessary in order to provide the required intensity of service to ensure the patient's safety given the presenting symptoms, physical exam findings, and initial radiographic and laboratory data in the context of their  Comorbid conditions.   Patient requires inpatient status due to high intensity of service, high risk for further deterioration and high frequency of surveillance required.   I certify that at the point of admission it is my clinical judgment that the patient will require inpatient hospital care spanning beyond [place] MD [facility]     02/12/2021, 12:03 AM

## 2021-02-12 NOTE — ED Notes (Signed)
Informed RN bed assigned 

## 2021-02-13 DIAGNOSIS — N189 Chronic kidney disease, unspecified: Secondary | ICD-10-CM | POA: Diagnosis not present

## 2021-02-13 DIAGNOSIS — N179 Acute kidney failure, unspecified: Secondary | ICD-10-CM | POA: Diagnosis not present

## 2021-02-13 LAB — BASIC METABOLIC PANEL
Anion gap: 10 (ref 5–15)
BUN: 82 mg/dL — ABNORMAL HIGH (ref 8–23)
CO2: 27 mmol/L (ref 22–32)
Calcium: 7.4 mg/dL — ABNORMAL LOW (ref 8.9–10.3)
Chloride: 102 mmol/L (ref 98–111)
Creatinine, Ser: 4.5 mg/dL — ABNORMAL HIGH (ref 0.61–1.24)
GFR, Estimated: 13 mL/min — ABNORMAL LOW (ref >60)
Glucose, Bld: 93 mg/dL (ref 70–99)
Potassium: 3.8 mmol/L (ref 3.5–5.1)
Sodium: 139 mmol/L (ref 135–145)

## 2021-02-13 LAB — GLUCOSE, CAPILLARY
Glucose-Capillary: 93 mg/dL (ref 70–99)
Glucose-Capillary: 106 mg/dL — ABNORMAL HIGH (ref 70–99)
Glucose-Capillary: 98 mg/dL (ref 70–99)
Glucose-Capillary: 104 mg/dL — ABNORMAL HIGH (ref 70–99)

## 2021-02-13 LAB — URINE CULTURE: Culture: NO GROWTH

## 2021-02-13 MED ORDER — MIDODRINE HCL 5 MG PO TABS
5.0000 mg | ORAL_TABLET | Freq: Three times a day (TID) | ORAL | Status: DC
  Administered 2021-02-13 – 2021-02-19 (×19): 5 mg via ORAL
  Filled 2021-02-13 (×18): qty 1

## 2021-02-13 MED ORDER — CHLORHEXIDINE GLUCONATE CLOTH 2 % EX PADS
6.0000 | MEDICATED_PAD | Freq: Every day | CUTANEOUS | Status: DC
  Administered 2021-02-13 – 2021-02-19 (×7): 6 via TOPICAL

## 2021-02-13 MED ORDER — TAMSULOSIN HCL 0.4 MG PO CAPS
0.4000 mg | ORAL_CAPSULE | Freq: Every day | ORAL | Status: DC
  Administered 2021-02-13 – 2021-02-18 (×6): 0.4 mg via ORAL
  Filled 2021-02-13 (×6): qty 1

## 2021-02-13 NOTE — Progress Notes (Signed)
[facility]  PROGRESS NOTE   Subjective:   Patient is comfortable. On IV fluids. Foley draining urine.  Renal sonogram is negative.  Objective:  Vital signs in last 24 hours:  Temp:  [97.4 F (36.3 C)-98.9 F (37.2 C)] 97.5 F (36.4 C) (05/28 1201) Pulse Rate:  [80-91] 84 (05/28 1201) Resp:  [18-20] 19 (05/28 1201) BP: (88-122)/(54-76) 113/62 (05/28 1201) SpO2:  [96 %-100 %] 96 % (05/28 1201)  Weight change:  Filed Weights   02/11/21 1630  Weight: 68 kg    Intake/Output: I/O last 3 completed shifts: In: 3496 [I.V.:2496; IV Piggyback:1000] Out: 2250 [Urine:2250]   Intake/Output this shift:  No intake/output data recorded.  Physical Exam: General:  No acute distress  Head:  Normocephalic, atraumatic. Moist oral mucosal membranes  Eyes:  Anicteric  Neck:  Supple  Lungs:   Clear to auscultation, normal effort  Heart:  S1S2 no rubs  Abdomen:   Soft, nontender, bowel sounds present  Extremities:  peripheral edema.  Neurologic:  Awake, alert, following commands  Skin:  No lesions  Access:     Basic Metabolic Panel: Recent Labs  Lab 02/11/21 1057 02/11/21 2104 02/12/21 0428 02/13/21 0536  NA 134* 134* 136 139  K 4.7 4.9 4.2 3.8  CL 105 104 106 102  CO2 11* 15* 16* 27  GLUCOSE 120* 138* 122* 93  BUN 112* 108* 100* 82*  CREATININE 6.95* 6.80* 5.72* 4.50*  CALCIUM 8.8* 9.1 8.3* 7.4*  MG  --  1.9  --   --     Liver Function Tests: Recent Labs  Lab 02/11/21 1057  AST 13*  ALT 10  ALKPHOS 62  BILITOT 1.2  PROT 7.7  ALBUMIN 3.5   No results for input(s): LIPASE, AMYLASE in the last 168 hours. No results for input(s): AMMONIA in the last 168 hours.  CBC: Recent Labs  Lab 02/11/21 1057 02/12/21 0428  WBC 9.9 5.7  NEUTROABS 8.3*  --   HGB 10.9* 9.2*  HCT 32.0* 26.8*  MCV 88.6 87.3  PLT 241 195    Cardiac Enzymes: No results for input(s): CKTOTAL, CKMB, CKMBINDEX, TROPONINI in the last 168 hours.  BNP: Invalid input(s):  POCBNP  CBG: Recent Labs  Lab 02/12/21 1219 02/12/21 1718 02/12/21 2201 02/13/21 0834 02/13/21 1230  GLUCAP 138* 87 103* 93 106*    Microbiology: Results for orders placed or performed during the hospital encounter of 02/11/21  Urine culture     Status: None   Collection Time: 02/11/21 10:41 PM   Specimen: Urine, Random  Result Value Ref Range Status   Specimen Description   Final    URINE, RANDOM Performed at [facility], [address] [place], [place] [address]    Special Requests   Final    NONE Performed at [facility], [address], [place], [place] [address]    Culture   Final    NO GROWTH Performed at [facility], [place] [address], [place], [place] [address]    Report Status 02/13/2021 FINAL  Final  Resp Panel by RT-PCR (Flu A&B, Covid) Urine, Catheterized     Status: Abnormal   Collection Time: 02/11/21 10:41 PM   Specimen: Urine, Catheterized; Nasopharyngeal(NP) swabs in vial transport medium  Result Value Ref Range Status   SARS Coronavirus 2 by RT PCR POSITIVE (A) NEGATIVE Final    Comment: RESULT CALLED TO, READ BACK BY AND VERIFIED WITH: Leonides Schanz RN 0006 02/12/21 HNM (NOTE) SARS-CoV-2 target  nucleic acids are DETECTED.  The SARS-CoV-2 RNA is generally detectable in upper respiratory specimens during the acute phase of infection. Positive results are indicative of the presence of the identified virus, but do not rule out bacterial infection or co-infection with other pathogens not detected by the test. Clinical correlation with patient history and other diagnostic information is necessary to determine patient infection status. The expected result is Negative.  Fact Sheet for Patients: EntrepreneurPulse.com.au  Fact Sheet for Healthcare Providers: IncredibleEmployment.be  This test is not yet approved or cleared by the [place] FDA and  has been authorized for detection  and/or diagnosis of SARS-CoV-2 by FDA under an Emergency Use Authorization (EUA).  This EUA will remain in effect (meaning this test can be  used) for the duration of  the COVID-19 declaration under Section 564(b)(1) of the Act, 21 U.S.C. section 360bbb-3(b)(1), unless the authorization is terminated or revoked sooner.     Influenza A by PCR NEGATIVE NEGATIVE Final   Influenza B by PCR NEGATIVE NEGATIVE Final    Comment: (NOTE) The Xpert Xpress SARS-CoV-2/FLU/RSV plus assay is intended as an aid in the diagnosis of influenza from Nasopharyngeal swab specimens and should not be used as a sole basis for treatment. Nasal washings and aspirates are unacceptable for Xpert Xpress SARS-CoV-2/FLU/RSV testing.  Fact Sheet for Patients: EntrepreneurPulse.com.au  Fact Sheet for Healthcare Providers: IncredibleEmployment.be  This test is not yet approved or cleared by the [place] FDA and has been authorized for detection and/or diagnosis of SARS-CoV-2 by FDA under an Emergency Use Authorization (EUA). This EUA will remain in effect (meaning this test can be used) for the duration of the COVID-19 declaration under Section 564(b)(1) of the Act, 21 U.S.C. section 360bbb-3(b)(1), unless the authorization is terminated or revoked.  Performed at [facility], [address] [place], [place] [address]     Coagulation Studies: No results for input(s): LABPROT, INR in the last 72 hours.  Urinalysis: Recent Labs    02/11/21 2104  COLORURINE YELLOW*  LABSPEC 1.012  PHURINE 5.0  GLUCOSEU 50*  HGBUR NEGATIVE  BILIRUBINUR NEGATIVE  KETONESUR NEGATIVE  PROTEINUR NEGATIVE  NITRITE NEGATIVE  LEUKOCYTESUR NEGATIVE      Imaging: US RENAL  Result Date: 02/12/2021 CLINICAL DATA:  Acute renal injury EXAM: RENAL / URINARY TRACT ULTRASOUND COMPLETE COMPARISON:  CT from the previous day. FINDINGS: Right Kidney: Renal measurements: 9.0 x 4.7 x  5.5 cm. = volume: 123 mL. Echogenicity within normal limits. No mass or hydronephrosis visualized. Left Kidney: Renal measurements: 10.3 x 5.2 x 5.8 cm. = volume: 163 mL. Echogenicity within normal limits. No mass or hydronephrosis visualized. Bladder: Decompressed by Foley catheter. Other: None. IMPRESSION: No acute renal abnormality noted. Electronically Signed   By: Inez Catalina M.D.   On: 02/12/2021 18:10   DG Chest Portable 1 View  Result Date: 02/11/2021 CLINICAL DATA:  Cough EXAM: PORTABLE CHEST 1 VIEW COMPARISON:  10/10/2019 FINDINGS: The heart size and mediastinal contours are within normal limits. Both lungs are clear. Chronic left upper rib deformities. IMPRESSION: No active disease. Electronically Signed   By: Donavan Foil M.D.   On: 02/11/2021 22:49   CT Renal Stone Study  Result Date: 02/11/2021 CLINICAL DATA:  Hematuria. EXAM: CT ABDOMEN AND PELVIS WITHOUT CONTRAST TECHNIQUE: Multidetector CT imaging of the abdomen and pelvis was performed following the standard protocol without IV contrast. COMPARISON:  11/16/2016 FINDINGS: Lower chest: Patchy and nodular airspace disease noted right lower lobe. No pleural effusion. Hepatobiliary:  No focal abnormality in the liver on this study without intravenous contrast. 9 mm calcified gallstone evident. No intrahepatic or extrahepatic biliary dilation. Pancreas: No focal mass lesion. No dilatation of the main duct. No intraparenchymal cyst. No peripancreatic edema. Spleen: No splenomegaly. No focal mass lesion. Adrenals/Urinary Tract: Stable 17 mm left adrenal adenoma. Right adrenal gland unremarkable. No stones are seen in either kidney or ureter. No secondary changes in either kidney or ureter. Bladder is distended with mass-effect on the bladder base presumably related to median lobe hypertrophy of the prostate gland. Small bladder diverticuli noted. Stomach/Bowel: Stomach is unremarkable. No gastric wall thickening. No evidence of outlet obstruction.  Duodenum is normally positioned as is the ligament of Treitz. No small bowel wall thickening. No small bowel dilatation. The terminal ileum is normal. The appendix is normal. No gross colonic mass. No colonic wall thickening. Diverticular changes are noted in the left colon without evidence of diverticulitis. Vascular/Lymphatic: There is abdominal aortic atherosclerosis without aneurysm. There is no gastrohepatic or hepatoduodenal ligament lymphadenopathy. No retroperitoneal or mesenteric lymphadenopathy. No pelvic sidewall lymphadenopathy. Reproductive: Prostate gland is enlarged. Other: No intraperitoneal free fluid. Musculoskeletal: No worrisome lytic or sclerotic osseous abnormality. IMPRESSION: 1. Patchy and nodular airspace disease right lower lobe. Imaging features are compatible with pneumonia, possibly atypical etiology. Follow-up CT chest without contrast in 3 months could be used to ensure resolution. 2. Cholelithiasis. 3. Stable 17 mm left adrenal adenoma. 4. Left colonic diverticulosis without diverticulitis. 5. Prostatomegaly. Associated bladder diverticuli raise the question of underlying component of bladder outlet obstruction. 6. Aortic Atherosclerosis (ICD10-I70.0). Electronically Signed   By: Misty Stanley M.D.   On: 02/11/2021 21:30     Medications:   . sodium chloride 75 mL/hr at 02/13/21 1207  .  sodium bicarbonate (isotonic) infusion in sterile water 125 mL/hr at 02/13/21 1204   . Chlorhexidine Gluconate Cloth  6 each Topical Daily  . heparin  5,000 Units Subcutaneous Q8H  . insulin aspart  0-5 Units Subcutaneous QHS  . insulin aspart  0-9 Units Subcutaneous TID WC  . midodrine  5 mg Oral TID WC  . tamsulosin  0.4 mg Oral QPC supper    Assessment/ Plan:     Principal Problem:   Acute kidney injury superimposed on CKD II (HCC) Active Problems:   Hypertension, goal below 140/90   Diabetes mellitus type 2, controlled, without complications (HCC)   BPH with  obstruction/lower urinary tract symptoms   Generalized weakness   Increased anion gap metabolic acidosis   Abnormal chest CT  70 year old male with history of hypertension, coronary artery disease, diabetes, peripheral vascular disease, BPH, CKD now admitted with history of weight loss, cough, shortness of breath and found to have significant metabolic acidosis, hypotension and acute kidney injury on top of chronic kidney disease.  AKI: Most likely due to prerenal azotemia complicated by post post renal issues.  Renal sonogram is negative.  Patient had a Foley catheter placed. Renal indices are slowly improving with IV fluid resuscitation.  We will continue 75 cc an hour. Will avoid nephrotoxic medications including IV contrast.    LOS: 2 Lyla Son, MD [facility] 5/28/20222:07 PM

## 2021-02-13 NOTE — Progress Notes (Addendum)
PROGRESS NOTE    CARLEY STRICKLING  YJE:[id number] DOB: 03-11-1951 DOA: 02/11/2021 PCP: [facility]    Brief Narrative:  CANDACE RAMUS is a 70 y.o. male with medical history significant for BPH with lower urinary obstructive symptoms currently undergoing evaluation by urology, elevated PSA insulin-dependent type 2 diabetes, HTN and stage II CKD, who was sent from his PCPs office for abnormal kidney function.  Patient had been complaining of generalized weakness and weight loss of over 20 pounds for the past 3 months and prostate biopsy was being arranged.  He has nonproductive cough but denies shortness of breath or chest pain and denies fever or chills.  Has no nausea, vomiting, abdominal pain or change in bowel habits. ED course: On arrival afebrile, BP 99/69 with pulse of 110 O2 sat 100% on room air blood work significant for creatinine of 6.8 above baseline of 1.6 just 4 months prior.  Anion gap was 18 and bicarb 11.  WBC normal hemoglobin 10.9 which was his baseline.  5/28- creatinine down 4.50.   Consultants:   cardiology  Procedures:   Antimicrobials:      Subjective: Pt sleepy this am. bp was low, had nursing recheck, with improvement of bp. No sob, cp. Foley in with urine.  Objective: Vitals:   02/12/21 1103 02/12/21 1723 02/12/21 1944 02/13/21 0630  BP: 124/84 112/70 122/76 (!) 88/54  Pulse: 87 91 88 80  Resp: 20 18 20 18   Temp: 98.7 F (37.1 C) 97.7 F (36.5 C) 98.9 F (37.2 C) 98.1 F (36.7 C)  TempSrc: Oral Oral Oral Oral  SpO2: 98% 100% 96% 98%  Weight:      Height:        Intake/Output Summary (Last 24 hours) at 02/13/2021 0851 Last data filed at 02/13/2021 0630 Gross per 24 hour  Intake 2496.02 ml  Output 1000 ml  Net 1496.02 ml   Filed Weights   02/11/21 1630  Weight: 68 kg    Examination:  General exam: Appears calm and comfortable  Respiratory system: Clear to auscultation. Respiratory effort normal. Cardiovascular system:  S1 & S2 heard, RRR. No JVD, murmurs, rubs, gallops or clicks.  Gastrointestinal system: Abdomen is nondistended, soft and nontender.. Normal bowel sounds heard. Foley in place Central nervous system: Alert and oriented. No focal neurological deficits. Extremities: no edema Skin: warm, dry Psychiatry: Judgement and insight appear normal. Mood & affect appropriate.     Data Reviewed: I have personally reviewed following labs and imaging studies  CBC: Recent Labs  Lab 02/11/21 1057 02/12/21 0428  WBC 9.9 5.7  NEUTROABS 8.3*  --   HGB 10.9* 9.2*  HCT 32.0* 26.8*  MCV 88.6 87.3  PLT 241 637   Basic Metabolic Panel: Recent Labs  Lab 02/11/21 1057 02/11/21 2104 02/12/21 0428 02/13/21 0536  NA 134* 134* 136 139  K 4.7 4.9 4.2 3.8  CL 105 104 106 102  CO2 11* 15* 16* 27  GLUCOSE 120* 138* 122* 93  BUN 112* 108* 100* 82*  CREATININE 6.95* 6.80* 5.72* 4.50*  CALCIUM 8.8* 9.1 8.3* 7.4*  MG  --  1.9  --   --    GFR: Estimated Creatinine Clearance: 15.1 mL/min (A) (by C-G formula based on SCr of 4.5 mg/dL (H)). Liver Function Tests: Recent Labs  Lab 02/11/21 1057  AST 13*  ALT 10  ALKPHOS 62  BILITOT 1.2  PROT 7.7  ALBUMIN 3.5   No results for input(s): LIPASE, AMYLASE in the last  168 hours. No results for input(s): AMMONIA in the last 168 hours. Coagulation Profile: No results for input(s): INR, PROTIME in the last 168 hours. Cardiac Enzymes: No results for input(s): CKTOTAL, CKMB, CKMBINDEX, TROPONINI in the last 168 hours. BNP (last 3 results) No results for input(s): PROBNP in the last 8760 hours. HbA1C: Recent Labs    02/11/21 1058  HGBA1C 6.4*   CBG: Recent Labs  Lab 02/12/21 0754 02/12/21 1219 02/12/21 1718 02/12/21 2201 02/13/21 0834  GLUCAP 117* 138* 87 103* 93   Lipid Profile: Recent Labs    02/11/21 1057  CHOL 136  HDL 38*  LDLCALC 63  TRIG 177*  CHOLHDL 3.6   Thyroid Function Tests: Recent Labs    02/11/21 1057  TSH 1.815    Anemia Panel: No results for input(s): VITAMINB12, FOLATE, FERRITIN, TIBC, IRON, RETICCTPCT in the last 72 hours. Sepsis Labs: Recent Labs  Lab 02/12/21 0428  PROCALCITON 0.30    Recent Results (from the past 240 hour(s))  Resp Panel by RT-PCR (Flu A&B, Covid) Urine, Catheterized     Status: Abnormal   Collection Time: 02/11/21 10:41 PM   Specimen: Urine, Catheterized; Nasopharyngeal(NP) swabs in vial transport medium  Result Value Ref Range Status   SARS Coronavirus 2 by RT PCR POSITIVE (A) NEGATIVE Final    Comment: RESULT CALLED TO, READ BACK BY AND VERIFIED WITH: Leonides Schanz RN 0006 02/12/21 HNM (NOTE) SARS-CoV-2 target nucleic acids are DETECTED.  The SARS-CoV-2 RNA is generally detectable in upper respiratory specimens during the acute phase of infection. Positive results are indicative of the presence of the identified virus, but do not rule out bacterial infection or co-infection with other pathogens not detected by the test. Clinical correlation with patient history and other diagnostic information is necessary to determine patient infection status. The expected result is Negative.  Fact Sheet for Patients: EntrepreneurPulse.com.au  Fact Sheet for Healthcare Providers: IncredibleEmployment.be  This test is not yet approved or cleared by the [place] FDA and  has been authorized for detection and/or diagnosis of SARS-CoV-2 by FDA under an Emergency Use Authorization (EUA).  This EUA will remain in effect (meaning this test can be  used) for the duration of  the COVID-19 declaration under Section 564(b)(1) of the Act, 21 U.S.C. section 360bbb-3(b)(1), unless the authorization is terminated or revoked sooner.     Influenza A by PCR NEGATIVE NEGATIVE Final   Influenza B by PCR NEGATIVE NEGATIVE Final    Comment: (NOTE) The Xpert Xpress SARS-CoV-2/FLU/RSV plus assay is intended as an aid in the diagnosis of influenza from  Nasopharyngeal swab specimens and should not be used as a sole basis for treatment. Nasal washings and aspirates are unacceptable for Xpert Xpress SARS-CoV-2/FLU/RSV testing.  Fact Sheet for Patients: EntrepreneurPulse.com.au  Fact Sheet for Healthcare Providers: IncredibleEmployment.be  This test is not yet approved or cleared by the [place] FDA and has been authorized for detection and/or diagnosis of SARS-CoV-2 by FDA under an Emergency Use Authorization (EUA). This EUA will remain in effect (meaning this test can be used) for the duration of the COVID-19 declaration under Section 564(b)(1) of the Act, 21 U.S.C. section 360bbb-3(b)(1), unless the authorization is terminated or revoked.  Performed at [facility], [address], [place], [place] [address]          Radiology Studies: US RENAL  Result Date: 02/12/2021 CLINICAL DATA:  Acute renal injury EXAM: RENAL / URINARY TRACT ULTRASOUND COMPLETE COMPARISON:  CT from the previous day.  FINDINGS: Right Kidney: Renal measurements: 9.0 x 4.7 x 5.5 cm. = volume: 123 mL. Echogenicity within normal limits. No mass or hydronephrosis visualized. Left Kidney: Renal measurements: 10.3 x 5.2 x 5.8 cm. = volume: 163 mL. Echogenicity within normal limits. No mass or hydronephrosis visualized. Bladder: Decompressed by Foley catheter. Other: None. IMPRESSION: No acute renal abnormality noted. Electronically Signed   By: Inez Catalina M.D.   On: 02/12/2021 18:10   DG Chest Portable 1 View  Result Date: 02/11/2021 CLINICAL DATA:  Cough EXAM: PORTABLE CHEST 1 VIEW COMPARISON:  10/10/2019 FINDINGS: The heart size and mediastinal contours are within normal limits. Both lungs are clear. Chronic left upper rib deformities. IMPRESSION: No active disease. Electronically Signed   By: Donavan Foil M.D.   On: 02/11/2021 22:49   CT Renal Stone Study  Result Date: 02/11/2021 CLINICAL DATA:  Hematuria.  EXAM: CT ABDOMEN AND PELVIS WITHOUT CONTRAST TECHNIQUE: Multidetector CT imaging of the abdomen and pelvis was performed following the standard protocol without IV contrast. COMPARISON:  11/16/2016 FINDINGS: Lower chest: Patchy and nodular airspace disease noted right lower lobe. No pleural effusion. Hepatobiliary: No focal abnormality in the liver on this study without intravenous contrast. 9 mm calcified gallstone evident. No intrahepatic or extrahepatic biliary dilation. Pancreas: No focal mass lesion. No dilatation of the main duct. No intraparenchymal cyst. No peripancreatic edema. Spleen: No splenomegaly. No focal mass lesion. Adrenals/Urinary Tract: Stable 17 mm left adrenal adenoma. Right adrenal gland unremarkable. No stones are seen in either kidney or ureter. No secondary changes in either kidney or ureter. Bladder is distended with mass-effect on the bladder base presumably related to median lobe hypertrophy of the prostate gland. Small bladder diverticuli noted. Stomach/Bowel: Stomach is unremarkable. No gastric wall thickening. No evidence of outlet obstruction. Duodenum is normally positioned as is the ligament of Treitz. No small bowel wall thickening. No small bowel dilatation. The terminal ileum is normal. The appendix is normal. No gross colonic mass. No colonic wall thickening. Diverticular changes are noted in the left colon without evidence of diverticulitis. Vascular/Lymphatic: There is abdominal aortic atherosclerosis without aneurysm. There is no gastrohepatic or hepatoduodenal ligament lymphadenopathy. No retroperitoneal or mesenteric lymphadenopathy. No pelvic sidewall lymphadenopathy. Reproductive: Prostate gland is enlarged. Other: No intraperitoneal free fluid. Musculoskeletal: No worrisome lytic or sclerotic osseous abnormality. IMPRESSION: 1. Patchy and nodular airspace disease right lower lobe. Imaging features are compatible with pneumonia, possibly atypical etiology. Follow-up CT  chest without contrast in 3 months could be used to ensure resolution. 2. Cholelithiasis. 3. Stable 17 mm left adrenal adenoma. 4. Left colonic diverticulosis without diverticulitis. 5. Prostatomegaly. Associated bladder diverticuli raise the question of underlying component of bladder outlet obstruction. 6. Aortic Atherosclerosis (ICD10-I70.0). Electronically Signed   By: Misty Stanley M.D.   On: 02/11/2021 21:30        Scheduled Meds: . Chlorhexidine Gluconate Cloth  6 each Topical Daily  . heparin  5,000 Units Subcutaneous Q8H  . insulin aspart  0-5 Units Subcutaneous QHS  . insulin aspart  0-9 Units Subcutaneous TID WC  . midodrine  5 mg Oral TID WC  . tamsulosin  0.4 mg Oral QPC supper   Continuous Infusions: . sodium chloride 75 mL/hr at 02/13/21 0000  .  sodium bicarbonate (isotonic) infusion in sterile water 125 mL/hr at 02/12/21 2306    Assessment & Plan:   Principal Problem:   Acute kidney injury superimposed on CKD II ([place]) Active Problems:   Hypertension, goal below 140/90   Diabetes  mellitus type 2, controlled, without complications ([place])   BPH with obstruction/lower urinary tract symptoms   Generalized weakness   Increased anion gap metabolic acidosis   Abnormal chest CT   70 year old male with history of BPH with lower urinary obstructive symptoms currently undergoing evaluation by urology, elevated PSA insulin-dependent type 2 diabetes, HTN and stage II CKD, who was sent from his PCPs office for weakness and abnormal kidney function.     Acute kidney injury superimposed on CKD II ([facility])   Increased anion gap metabolic acidosis   BPH with obstruction/lower urinary tract symptoms - Suspect combination of post renal, as well as prerenal given recent history of weight loss and poor oral intake -CT renal stone study with prostatomegaly and bladder diverticuli, possible underlying component of bladder outlet obstruction -5/28  Foley in place  Renal function  improving slowly with IV fluids  Continue IV hydration  Nephrology's input was appreciated.  Recommend obtaining renal ultrasound-no acute renal abnormality noted No immediate need for dialysis. Avoid nephrotoxins, avoid hypotension Urologist input was appreciated-start tamsulosin 0.4 mg daily Continue Foley catheter for 7 to 10 days with plans for outpatient voiding trial (scheduled) Rescheduling upcoming prostate biopsy given indwelling Foley catheter for 1 week following biopsy( schedule)     Generalized weakness/weight loss - Uncertain primary etiology. covid infection likely attributing to weakness. cxr without acute dz Continue ivf as pt at times is hypotensive Encouraged po intake PT/OT consult   Hypertension- bp nml-low Hold bp meds Start midodrine     Diabetes mellitus type 2, controlled, without complications (HCC) BG stable, continue riss    Abnormal CT/Covid 19 infection Patchy and nodular airspace disease right lower lobe. Imaging features are compatible with pneumonia, possibly atypical etiology. Follow-up CT chest without contrast in 3 months could be used to ensure resolution On room air  DVT prophylaxis: heparin Code Status:full Family Communication: none at bedside  Status is: Inpatient  Remains inpatient appropriate because:Hemodynamically unstable and Inpatient level of care appropriate due to severity of illness   Dispo: The patient is from: Home              Anticipated d/c is to: Home              Patient currently is not medically stable to d/c.   Difficult to place patient No            LOS: 2 days   Time spent: 35 min with >50% on coc    Nolberto Hanlon, MD [facility] Pager [phone]  If 7PM-7AM, please contact night-coverage 02/13/2021, 8:51 AM

## 2021-02-14 DIAGNOSIS — N189 Chronic kidney disease, unspecified: Secondary | ICD-10-CM | POA: Diagnosis not present

## 2021-02-14 DIAGNOSIS — N179 Acute kidney failure, unspecified: Secondary | ICD-10-CM | POA: Diagnosis not present

## 2021-02-14 LAB — BASIC METABOLIC PANEL
Anion gap: 13 (ref 5–15)
BUN: 61 mg/dL — ABNORMAL HIGH (ref 8–23)
CO2: 29 mmol/L (ref 22–32)
Calcium: 6.8 mg/dL — ABNORMAL LOW (ref 8.9–10.3)
Chloride: 98 mmol/L (ref 98–111)
Creatinine, Ser: 3.98 mg/dL — ABNORMAL HIGH (ref 0.61–1.24)
GFR, Estimated: 16 mL/min — ABNORMAL LOW (ref >60)
Glucose, Bld: 85 mg/dL (ref 70–99)
Potassium: 3.8 mmol/L (ref 3.5–5.1)
Sodium: 140 mmol/L (ref 135–145)

## 2021-02-14 LAB — GLUCOSE, CAPILLARY
Glucose-Capillary: 78 mg/dL (ref 70–99)
Glucose-Capillary: 75 mg/dL (ref 70–99)
Glucose-Capillary: 90 mg/dL (ref 70–99)
Glucose-Capillary: 86 mg/dL (ref 70–99)

## 2021-02-14 MED ORDER — CALCIUM CARBONATE 1250 (500 CA) MG PO TABS
500.0000 mg | ORAL_TABLET | Freq: Two times a day (BID) | ORAL | Status: DC
  Administered 2021-02-14 – 2021-02-15 (×3): 500 mg via ORAL
  Filled 2021-02-14 (×4): qty 1

## 2021-02-14 NOTE — Evaluation (Signed)
Physical Therapy Evaluation Patient Details Name: Scott Woods MRN: [id number] DOB: June 13, 1951 Today's Date: 02/14/2021   History of Present Illness  Pt is a 70 y.o. male with medical history significant for BPH with lower urinary obstructive symptoms currently undergoing evaluation by urology, elevated PSA insulin-dependent type 2 diabetes, HTN and stage II CKD, who was sent from his PCPs office for abnormal kidney function.  MD assessment includes: Acute kidney injury superimposed on CKD II, generalized weakness, HTN, and Covid 19 infection.    Clinical Impression  Pt was pleasant and motivated to participate during the session and put forth good effort during the session.  Pt required extra time and effort with bed mobility tasks and min A to come to standing.  Pt ambulated a max of 8 feet before becoming fatigued and needed to return to sitting with SpO2 and HR WNL on room air.  Pt will benefit from PT services in a SNF setting upon discharge to safely address deficits listed in patient problem list for decreased caregiver assistance and eventual return to PLOF.     Follow Up Recommendations SNF;Supervision/Assistance - 24 hour    Equipment Recommendations  Rolling walker with 5" wheels    Recommendations for Other Services       Precautions / Restrictions Precautions Precautions: Fall Restrictions Weight Bearing Restrictions: No      Mobility  Bed Mobility Overal bed mobility: Modified Independent             General bed mobility comments: Extra time and effort    Transfers Overall transfer level: Needs assistance Equipment used: Rolling walker (2 wheeled) Transfers: Sit to/from Stand Sit to Stand: From elevated surface;Min assist         General transfer comment: Mod verbal cues for sequencing and min A to come to full upright standing  Ambulation/Gait Ambulation/Gait assistance: Min guard Gait Distance (Feet): 8 Feet Assistive device: Rolling walker (2  wheeled) Gait Pattern/deviations: Step-through pattern;Decreased step length - right;Decreased step length - left Gait velocity: decreased   General Gait Details: Pt able to amb a max of 8 feet before needing to return to sitting secondary to fatigue with SpO2 and HR WNL on room air  Stairs            Wheelchair Mobility    Modified Rankin (Stroke Patients Only)       Balance Overall balance assessment: Needs assistance   Sitting balance-Leahy Scale: Good     Standing balance support: Bilateral upper extremity supported;During functional activity Standing balance-Leahy Scale: Fair                               Pertinent Vitals/Pain Pain Assessment: No/denies pain    Home Living Family/patient expects to be discharged to:: Private residence Living Arrangements: Alone Available Help at Discharge: Family;Available PRN/intermittently Type of Home: House Home Access: Stairs to enter Entrance Stairs-Rails: Right;Left;Can reach both Entrance Stairs-Number of Steps: 3 Home Layout: One level Home Equipment: Bedside commode      Prior Function Level of Independence: Independent         Comments: Ind amb community distances without an AD, Ind with ADLs, no fall history     Hand Dominance        Extremity/Trunk Assessment   Upper Extremity Assessment Upper Extremity Assessment: Generalized weakness    Lower Extremity Assessment Lower Extremity Assessment: Generalized weakness       Communication   Communication:  No difficulties  Cognition Arousal/Alertness: Awake/alert Behavior During Therapy: WFL for tasks assessed/performed Overall Cognitive Status: Within Functional Limits for tasks assessed                                        General Comments      Exercises Total Joint Exercises Ankle Circles/Pumps: AROM;Strengthening;Both;10 reps Quad Sets: Strengthening;Both;10 reps Gluteal Sets: Strengthening;Both;10  reps Long Arc Quad: Strengthening;Both;10 reps Knee Flexion: 10 reps;Both;Strengthening Marching in Standing: AROM;Strengthening;Both;5 reps;Standing Other Exercises Other Exercises: HEP education for BLE APs, QS, GS, and LAQs x 10 each 5-6x/day   Assessment/Plan    PT Assessment Patient needs continued PT services  PT Problem List Decreased strength;Decreased balance;Decreased mobility;Decreased knowledge of use of DME;Decreased activity tolerance       PT Treatment Interventions DME instruction;Gait training;Stair training;Functional mobility training;Therapeutic activities;Therapeutic exercise;Patient/family education    PT Goals (Current goals can be found in the Care Plan section)  Acute Rehab PT Goals Patient Stated Goal: To return home PT Goal Formulation: With patient Time For Goal Achievement: 02/27/21 Potential to Achieve Goals: Good    Frequency Min 2X/week   Barriers to discharge Inaccessible home environment;Decreased caregiver support      Co-evaluation               AM-PAC PT "6 Clicks" Mobility  Outcome Measure Help needed turning from your back to your side while in a flat bed without using bedrails?: A Little Help needed moving from lying on your back to sitting on the side of a flat bed without using bedrails?: A Little Help needed moving to and from a bed to a chair (including a wheelchair)?: A Little Help needed standing up from a chair using your arms (e.g., wheelchair or bedside chair)?: A Little Help needed to walk in hospital room?: A Lot Help needed climbing 3-5 steps with a railing? : Total 6 Click Score: 15    End of Session Equipment Utilized During Treatment: Gait belt Activity Tolerance: Patient tolerated treatment well Patient left: in chair;with call bell/phone within reach;with chair alarm set Nurse Communication: Mobility status PT Visit Diagnosis: Difficulty in walking, not elsewhere classified (R26.2);Muscle weakness  (generalized) (M62.81)    Time: 1349-1430 PT Time Calculation (min) (ACUTE ONLY): 41 min   Charges:   PT Evaluation $PT Eval Moderate Complexity: 1 Mod PT Treatments $Therapeutic Exercise: 8-22 mins $Therapeutic Activity: 8-22 mins       D. Royetta Asal PT, DPT 02/14/21, 4:51 PM

## 2021-02-14 NOTE — Progress Notes (Signed)
[facility]  PROGRESS NOTE   Subjective:   Patient is comfortable. On IV fluids. Urine output is acceptable and is over 2 L yesterday. Renal indicis are improving.  Objective:  Vital signs in last 24 hours:  Temp:  [97.4 F (36.3 C)-98.5 F (36.9 C)] 97.4 F (36.3 C) (05/29 0804) Pulse Rate:  [77-85] 81 (05/29 0804) Resp:  [16-18] 16 (05/29 0804) BP: (100-109)/(52-66) 100/56 (05/29 0804) SpO2:  [94 %-100 %] 95 % (05/29 0804)  Weight change:  Filed Weights   02/11/21 1630  Weight: 68 kg    Intake/Output: I/O last 3 completed shifts: In: -  Out: 2300 [Urine:2300]   Intake/Output this shift:  No intake/output data recorded.  Physical Exam: General:  No acute distress  Head:  Normocephalic, atraumatic. Moist oral mucosal membranes  Eyes:  Anicteric  Neck:  Supple  Lungs:   Clear to auscultation, normal effort  Heart:  S1S2 no rubs  Abdomen:   Soft, nontender, bowel sounds present  Extremities:  peripheral edema.  Neurologic:  Awake, alert, following commands  Skin:  No lesions  Access:     Basic Metabolic Panel: Recent Labs  Lab 02/11/21 1057 02/11/21 2104 02/12/21 0428 02/13/21 0536 02/14/21 0642  NA 134* 134* 136 139 140  K 4.7 4.9 4.2 3.8 3.8  CL 105 104 106 102 98  CO2 11* 15* 16* 27 29  GLUCOSE 120* 138* 122* 93 85  BUN 112* 108* 100* 82* 61*  CREATININE 6.95* 6.80* 5.72* 4.50* 3.98*  CALCIUM 8.8* 9.1 8.3* 7.4* 6.8*  MG  --  1.9  --   --   --     Liver Function Tests: Recent Labs  Lab 02/11/21 1057  AST 13*  ALT 10  ALKPHOS 62  BILITOT 1.2  PROT 7.7  ALBUMIN 3.5   No results for input(s): LIPASE, AMYLASE in the last 168 hours. No results for input(s): AMMONIA in the last 168 hours.  CBC: Recent Labs  Lab 02/11/21 1057 02/12/21 0428  WBC 9.9 5.7  NEUTROABS 8.3*  --   HGB 10.9* 9.2*  HCT 32.0* 26.8*  MCV 88.6 87.3  PLT 241 195    Cardiac Enzymes: No results for input(s): CKTOTAL, CKMB, CKMBINDEX, TROPONINI in  the last 168 hours.  BNP: Invalid input(s): POCBNP  CBG: Recent Labs  Lab 02/13/21 0834 02/13/21 1230 02/13/21 1615 02/13/21 2203 02/14/21 0804  GLUCAP 93 106* 98 104* 83    Microbiology: Results for orders placed or performed during the hospital encounter of 02/11/21  Urine culture     Status: None   Collection Time: 02/11/21 10:41 PM   Specimen: Urine, Random  Result Value Ref Range Status   Specimen Description   Final    URINE, RANDOM Performed at [facility], [address] [place], [place] [address]    Special Requests   Final    NONE Performed at [facility], [address] [place], [place] [address]    Culture   Final    NO GROWTH Performed at [facility], [place] [address] [place], [place] [address]    Report Status 02/13/2021 FINAL  Final  Resp Panel by RT-PCR (Flu A&B, Covid) Urine, Catheterized     Status: Abnormal   Collection Time: 02/11/21 10:41 PM   Specimen: Urine, Catheterized; Nasopharyngeal(NP) swabs in vial transport medium  Result Value Ref Range Status   SARS Coronavirus 2 by RT PCR POSITIVE (A) NEGATIVE Final    Comment: RESULT  CALLED TO, READ BACK BY AND VERIFIED WITH: Leonides Schanz RN 0006 02/12/21 HNM (NOTE) SARS-CoV-2 target nucleic acids are DETECTED.  The SARS-CoV-2 RNA is generally detectable in upper respiratory specimens during the acute phase of infection. Positive results are indicative of the presence of the identified virus, but do not rule out bacterial infection or co-infection with other pathogens not detected by the test. Clinical correlation with patient history and other diagnostic information is necessary to determine patient infection status. The expected result is Negative.  Fact Sheet for Patients: EntrepreneurPulse.com.au  Fact Sheet for Healthcare Providers: IncredibleEmployment.be  This test is not yet approved or cleared by the [place] FDA  and  has been authorized for detection and/or diagnosis of SARS-CoV-2 by FDA under an Emergency Use Authorization (EUA).  This EUA will remain in effect (meaning this test can be  used) for the duration of  the COVID-19 declaration under Section 564(b)(1) of the Act, 21 U.S.C. section 360bbb-3(b)(1), unless the authorization is terminated or revoked sooner.     Influenza A by PCR NEGATIVE NEGATIVE Final   Influenza B by PCR NEGATIVE NEGATIVE Final    Comment: (NOTE) The Xpert Xpress SARS-CoV-2/FLU/RSV plus assay is intended as an aid in the diagnosis of influenza from Nasopharyngeal swab specimens and should not be used as a sole basis for treatment. Nasal washings and aspirates are unacceptable for Xpert Xpress SARS-CoV-2/FLU/RSV testing.  Fact Sheet for Patients: EntrepreneurPulse.com.au  Fact Sheet for Healthcare Providers: IncredibleEmployment.be  This test is not yet approved or cleared by the [place] FDA and has been authorized for detection and/or diagnosis of SARS-CoV-2 by FDA under an Emergency Use Authorization (EUA). This EUA will remain in effect (meaning this test can be used) for the duration of the COVID-19 declaration under Section 564(b)(1) of the Act, 21 U.S.C. section 360bbb-3(b)(1), unless the authorization is terminated or revoked.  Performed at [facility], [address] [place], [place] [address]     Coagulation Studies: No results for input(s): LABPROT, INR in the last 72 hours.  Urinalysis: Recent Labs    02/11/21 2104  COLORURINE YELLOW*  LABSPEC 1.012  PHURINE 5.0  GLUCOSEU 50*  HGBUR NEGATIVE  BILIRUBINUR NEGATIVE  KETONESUR NEGATIVE  PROTEINUR NEGATIVE  NITRITE NEGATIVE  LEUKOCYTESUR NEGATIVE      Imaging: US RENAL  Result Date: 02/12/2021 CLINICAL DATA:  Acute renal injury EXAM: RENAL / URINARY TRACT ULTRASOUND COMPLETE COMPARISON:  CT from the previous day. FINDINGS: Right  Kidney: Renal measurements: 9.0 x 4.7 x 5.5 cm. = volume: 123 mL. Echogenicity within normal limits. No mass or hydronephrosis visualized. Left Kidney: Renal measurements: 10.3 x 5.2 x 5.8 cm. = volume: 163 mL. Echogenicity within normal limits. No mass or hydronephrosis visualized. Bladder: Decompressed by Foley catheter. Other: None. IMPRESSION: No acute renal abnormality noted. Electronically Signed   By: Inez Catalina M.D.   On: 02/12/2021 18:10     Medications:   . sodium chloride 75 mL/hr at 02/13/21 1207  .  sodium bicarbonate (isotonic) infusion in sterile water 125 mL/hr at 02/13/21 2236   . Chlorhexidine Gluconate Cloth  6 each Topical Daily  . heparin  5,000 Units Subcutaneous Q8H  . insulin aspart  0-5 Units Subcutaneous QHS  . insulin aspart  0-9 Units Subcutaneous TID WC  . midodrine  5 mg Oral TID WC  . tamsulosin  0.4 mg Oral QPC supper    Assessment/ Plan:     Principal Problem:   Acute kidney injury  superimposed on CKD II (HCC) Active Problems:   Hypertension, goal below 140/90   Diabetes mellitus type 2, controlled, without complications (HCC)   BPH with obstruction/lower urinary tract symptoms   Generalized weakness   Increased anion gap metabolic acidosis   Abnormal chest CT  70 year old male with history of hypertension, coronary artery disease, diabetes, peripheral vascular disease, BPH, CKD now admitted with history of weight loss, cough, shortness of breath and found to have significant metabolic acidosis, hypotension and acute kidney injury on top of chronic kidney disease.  #1 AKI: Most likely due to prerenal azotemia complicated by post post renal issues.  Renal sonogram is negative.  Patient had a Foley catheter placed. Renal indices are slowly improving with IV fluid resuscitation.  We will continue 75 cc an hour. Will avoid nephrotoxic medications including IV contrast.  #2 anemia, possibly secondary to CKD  #3 hypocalcemia, possibly due to secondary  hyperparathyroidism.  We will start him on calcium carbonate 1200 mg twice a day.  #4 COVID infection, stable at this time.  We will continue to monitor closely.   LOS: [place], MD [facility] 5/29/202212:04 PM

## 2021-02-14 NOTE — Progress Notes (Signed)
PROGRESS NOTE    Scott Woods  VXB:[id number] DOB: 1951-06-12 DOA: 02/11/2021 PCP: [facility]    Brief Narrative:  Scott Woods is a 70 y.o. male with medical history significant for BPH with lower urinary obstructive symptoms currently undergoing evaluation by urology, elevated PSA insulin-dependent type 2 diabetes, HTN and stage II CKD, who was sent from his PCPs office for abnormal kidney function.  Patient had been complaining of generalized weakness and weight loss of over 20 pounds for the past 3 months and prostate biopsy was being arranged.  He has nonproductive cough but denies shortness of breath or chest pain and denies fever or chills.  Has no nausea, vomiting, abdominal pain or change in bowel habits. ED course: On arrival afebrile, BP 99/69 with pulse of 110 O2 sat 100% on room air blood work significant for creatinine of 6.8 above baseline of 1.6 just 4 months prior.  Anion gap was 18 and bicarb 11.  WBC normal hemoglobin 10.9 which was his baseline.  5/29-no sob, no cp. Creatinine 3.98  Consultants:   cardiology  Procedures:   Antimicrobials:      Subjective: No sob, cp, d/n/v  Objective: Vitals:   02/13/21 1613 02/13/21 2239 02/14/21 0612 02/14/21 0804  BP: 109/66 103/65 (!) 109/52 (!) 100/56  Pulse: 82 77 85 81  Resp: 17  18 16   Temp: 98 F (36.7 C) 98.2 F (36.8 C) 98.5 F (36.9 C) (!) 97.4 F (36.3 C)  TempSrc: Oral Oral Oral Oral  SpO2: 96% 100% 94% 95%  Weight:      Height:        Intake/Output Summary (Last 24 hours) at 02/14/2021 0837 Last data filed at 02/14/2021 0615 Gross per 24 hour  Intake --  Output 1300 ml  Net -1300 ml   Filed Weights   02/11/21 1630  Weight: 68 kg    Examination: Nad, calm cta no w/r Regular s1/s2 no gallop Soft benign +bs No edema  Awake and alert, grossly intact    Data Reviewed: I have personally reviewed following labs and imaging studies  CBC: Recent Labs  Lab  02/11/21 1057 02/12/21 0428  WBC 9.9 5.7  NEUTROABS 8.3*  --   HGB 10.9* 9.2*  HCT 32.0* 26.8*  MCV 88.6 87.3  PLT 241 330   Basic Metabolic Panel: Recent Labs  Lab 02/11/21 1057 02/11/21 2104 02/12/21 0428 02/13/21 0536 02/14/21 0642  NA 134* 134* 136 139 140  K 4.7 4.9 4.2 3.8 3.8  CL 105 104 106 102 98  CO2 11* 15* 16* 27 29  GLUCOSE 120* 138* 122* 93 85  BUN 112* 108* 100* 82* 61*  CREATININE 6.95* 6.80* 5.72* 4.50* 3.98*  CALCIUM 8.8* 9.1 8.3* 7.4* 6.8*  MG  --  1.9  --   --   --    GFR: Estimated Creatinine Clearance: 17.1 mL/min (A) (by C-G formula based on SCr of 3.98 mg/dL (H)). Liver Function Tests: Recent Labs  Lab 02/11/21 1057  AST 13*  ALT 10  ALKPHOS 62  BILITOT 1.2  PROT 7.7  ALBUMIN 3.5   No results for input(s): LIPASE, AMYLASE in the last 168 hours. No results for input(s): AMMONIA in the last 168 hours. Coagulation Profile: No results for input(s): INR, PROTIME in the last 168 hours. Cardiac Enzymes: No results for input(s): CKTOTAL, CKMB, CKMBINDEX, TROPONINI in the last 168 hours. BNP (last 3 results) No results for input(s): PROBNP in the last 8760 hours. HbA1C:  Recent Labs    02/11/21 1058  HGBA1C 6.4*   CBG: Recent Labs  Lab 02/13/21 0834 02/13/21 1230 02/13/21 1615 02/13/21 2203 02/14/21 0804  GLUCAP 93 106* 98 104* 78   Lipid Profile: Recent Labs    02/11/21 1057  CHOL 136  HDL 38*  LDLCALC 63  TRIG 177*  CHOLHDL 3.6   Thyroid Function Tests: Recent Labs    02/11/21 1057  TSH 1.815   Anemia Panel: No results for input(s): VITAMINB12, FOLATE, FERRITIN, TIBC, IRON, RETICCTPCT in the last 72 hours. Sepsis Labs: Recent Labs  Lab 02/12/21 0428  PROCALCITON 0.30    Recent Results (from the past 240 hour(s))  Urine culture     Status: None   Collection Time: 02/11/21 10:41 PM   Specimen: Urine, Random  Result Value Ref Range Status   Specimen Description   Final    URINE, RANDOM Performed at [facility], [address], [place], [place] [address]    Special Requests   Final    NONE Performed at [facility], [address] [place], [place] [address]    Culture   Final    NO GROWTH Performed at [facility], [place] [address], [place], [place] [address]    Report Status 02/13/2021 FINAL  Final  Resp Panel by RT-PCR (Flu A&B, Covid) Urine, Catheterized     Status: Abnormal   Collection Time: 02/11/21 10:41 PM   Specimen: Urine, Catheterized; Nasopharyngeal(NP) swabs in vial transport medium  Result Value Ref Range Status   SARS Coronavirus 2 by RT PCR POSITIVE (A) NEGATIVE Final    Comment: RESULT CALLED TO, READ BACK BY AND VERIFIED WITH: Leonides Schanz RN 0006 02/12/21 HNM (NOTE) SARS-CoV-2 target nucleic acids are DETECTED.  The SARS-CoV-2 RNA is generally detectable in upper respiratory specimens during the acute phase of infection. Positive results are indicative of the presence of the identified virus, but do not rule out bacterial infection or co-infection with other pathogens not detected by the test. Clinical correlation with patient history and other diagnostic information is necessary to determine patient infection status. The expected result is Negative.  Fact Sheet for Patients: EntrepreneurPulse.com.au  Fact Sheet for Healthcare Providers: IncredibleEmployment.be  This test is not yet approved or cleared by the [place] FDA and  has been authorized for detection and/or diagnosis of SARS-CoV-2 by FDA under an Emergency Use Authorization (EUA).  This EUA will remain in effect (meaning this test can be  used) for the duration of  the COVID-19 declaration under Section 564(b)(1) of the Act, 21 U.S.C. section 360bbb-3(b)(1), unless the authorization is terminated or revoked sooner.     Influenza A by PCR NEGATIVE NEGATIVE Final   Influenza B by PCR NEGATIVE NEGATIVE Final    Comment: (NOTE) The  Xpert Xpress SARS-CoV-2/FLU/RSV plus assay is intended as an aid in the diagnosis of influenza from Nasopharyngeal swab specimens and should not be used as a sole basis for treatment. Nasal washings and aspirates are unacceptable for Xpert Xpress SARS-CoV-2/FLU/RSV testing.  Fact Sheet for Patients: EntrepreneurPulse.com.au  Fact Sheet for Healthcare Providers: IncredibleEmployment.be  This test is not yet approved or cleared by the [place] FDA and has been authorized for detection and/or diagnosis of SARS-CoV-2 by FDA under an Emergency Use Authorization (EUA). This EUA will remain in effect (meaning this test can be used) for the duration of the COVID-19 declaration under Section 564(b)(1) of the Act, 21 U.S.C. section 360bbb-3(b)(1), unless the authorization  is terminated or revoked.  Performed at [facility], [address] [place], [place] [address]          Radiology Studies: US RENAL  Result Date: 02/12/2021 CLINICAL DATA:  Acute renal injury EXAM: RENAL / URINARY TRACT ULTRASOUND COMPLETE COMPARISON:  CT from the previous day. FINDINGS: Right Kidney: Renal measurements: 9.0 x 4.7 x 5.5 cm. = volume: 123 mL. Echogenicity within normal limits. No mass or hydronephrosis visualized. Left Kidney: Renal measurements: 10.3 x 5.2 x 5.8 cm. = volume: 163 mL. Echogenicity within normal limits. No mass or hydronephrosis visualized. Bladder: Decompressed by Foley catheter. Other: None. IMPRESSION: No acute renal abnormality noted. Electronically Signed   By: Inez Catalina M.D.   On: 02/12/2021 18:10        Scheduled Meds: . Chlorhexidine Gluconate Cloth  6 each Topical Daily  . heparin  5,000 Units Subcutaneous Q8H  . insulin aspart  0-5 Units Subcutaneous QHS  . insulin aspart  0-9 Units Subcutaneous TID WC  . midodrine  5 mg Oral TID WC  . tamsulosin  0.4 mg Oral QPC supper   Continuous Infusions: . sodium chloride 75  mL/hr at 02/13/21 1207  .  sodium bicarbonate (isotonic) infusion in sterile water 125 mL/hr at 02/13/21 2236    Assessment & Plan:   Principal Problem:   Acute kidney injury superimposed on CKD II ([place]) Active Problems:   Hypertension, goal below 140/90   Diabetes mellitus type 2, controlled, without complications (HCC)   BPH with obstruction/lower urinary tract symptoms   Generalized weakness   Increased anion gap metabolic acidosis   Abnormal chest CT   70 year old male with history of BPH with lower urinary obstructive symptoms currently undergoing evaluation by urology, elevated PSA insulin-dependent type 2 diabetes, HTN and stage II CKD, who was sent from his PCPs office for weakness and abnormal kidney function.     Acute kidney injury superimposed on CKD II (HCC)   Increased anion gap metabolic acidosis   BPH with obstruction/lower urinary tract symptoms - Suspect combination of post renal, as well as prerenal given recent history of weight loss and poor oral intake/prerenal -CT renal stone study with prostatomegaly and bladder diverticuli, possible underlying component of bladder outlet obstruction   Foley in place  5/29 nephrology's following  Renal ultrasound no acute renal abnormality  no immediate need for dialysis  Creatinine slowly improving  continue IV fluids at 75 MLS per hour Avoid nephrotoxic medications, avoid hypotension, avoid IV contrast Urologist following Continue tamsulosin Continue Foley catheter for 7 to 10 days with plans for outpatient voiding trial (scheduled) Rescheduling upcoming prostate biopsy given indwelling Foley catheter for 1 week following biopsy (scheduled)       Generalized weakness/weight loss - Uncertain primary etiology. covid infection likely attributing to weakness. cxr without acute dz 5/29 continue IVf Encourage p.o. intake  PT OT consult     Hypertension- bp nml-low Hold BP meds Continue midodrine     Diabetes  mellitus type 2, controlled, without complications (HCC) BG stable Continue R-ISS    Abnormal CT/Covid 19 infection Patchy and nodular airspace disease right lower lobe. Imaging features are compatible with pneumonia, possibly atypical etiology. Follow-up CT chest without contrast in 3 months could be used to ensure resolution On room air  DVT prophylaxis: heparin Code Status:full Family Communication: none at bedside  Status is: Inpatient  Remains inpatient appropriate because:Hemodynamically unstable and Inpatient level of care appropriate due to severity of illness   Dispo:  The patient is from: Home              Anticipated d/c is to: Home              Patient currently is not medically stable to d/c.   Difficult to place patient No            LOS: 3 days   Time spent: 35 min with >50% on coc    Nolberto Hanlon, MD [facility] Pager [phone]  If 7PM-7AM, please contact night-coverage 02/14/2021, 8:37 AM

## 2021-02-15 DIAGNOSIS — N179 Acute kidney failure, unspecified: Secondary | ICD-10-CM | POA: Diagnosis not present

## 2021-02-15 DIAGNOSIS — N189 Chronic kidney disease, unspecified: Secondary | ICD-10-CM | POA: Diagnosis not present

## 2021-02-15 LAB — BASIC METABOLIC PANEL
Anion gap: 17 — ABNORMAL HIGH (ref 5–15)
BUN: 49 mg/dL — ABNORMAL HIGH (ref 8–23)
CO2: 30 mmol/L (ref 22–32)
Calcium: 6.3 mg/dL — CL (ref 8.9–10.3)
Chloride: 91 mmol/L — ABNORMAL LOW (ref 98–111)
Creatinine, Ser: 3.4 mg/dL — ABNORMAL HIGH (ref 0.61–1.24)
GFR, Estimated: 19 mL/min — ABNORMAL LOW (ref >60)
Glucose, Bld: 110 mg/dL — ABNORMAL HIGH (ref 70–99)
Potassium: 3.4 mmol/L — ABNORMAL LOW (ref 3.5–5.1)
Sodium: 138 mmol/L (ref 135–145)

## 2021-02-15 LAB — GLUCOSE, CAPILLARY
Glucose-Capillary: 88 mg/dL (ref 70–99)
Glucose-Capillary: 103 mg/dL — ABNORMAL HIGH (ref 70–99)
Glucose-Capillary: 128 mg/dL — ABNORMAL HIGH (ref 70–99)
Glucose-Capillary: 143 mg/dL — ABNORMAL HIGH (ref 70–99)

## 2021-02-15 LAB — CBC
HCT: 24.7 % — ABNORMAL LOW (ref 39.0–52.0)
Hemoglobin: 8.5 g/dL — ABNORMAL LOW (ref 13.0–17.0)
MCH: 30.5 pg (ref 26.0–34.0)
MCHC: 34.4 g/dL (ref 30.0–36.0)
MCV: 88.5 fL (ref 80.0–100.0)
Platelets: 154 10*3/uL (ref 150–400)
RBC: 2.79 MIL/uL — ABNORMAL LOW (ref 4.22–5.81)
RDW: 12.3 % (ref 11.5–15.5)
WBC: 5.2 10*3/uL (ref 4.0–10.5)
nRBC: 0 % (ref 0.0–0.2)

## 2021-02-15 LAB — MAGNESIUM: Magnesium: 1.3 mg/dL — ABNORMAL LOW (ref 1.7–2.4)

## 2021-02-15 MED ORDER — ALUM & MAG HYDROXIDE-SIMETH 200-200-20 MG/5ML PO SUSP
30.0000 mL | Freq: Four times a day (QID) | ORAL | Status: DC | PRN
  Administered 2021-02-15: 30 mL via ORAL
  Filled 2021-02-15: qty 30

## 2021-02-15 MED ORDER — POTASSIUM CHLORIDE CRYS ER 20 MEQ PO TBCR
20.0000 meq | EXTENDED_RELEASE_TABLET | Freq: Once | ORAL | Status: AC
  Administered 2021-02-15: 20 meq via ORAL
  Filled 2021-02-15: qty 1

## 2021-02-15 MED ORDER — MAGNESIUM SULFATE 4 GM/100ML IV SOLN
4.0000 g | Freq: Once | INTRAVENOUS | Status: AC
  Administered 2021-02-15: 4 g via INTRAVENOUS
  Filled 2021-02-15: qty 100

## 2021-02-15 MED ORDER — CALCIUM GLUCONATE-NACL 2-0.675 GM/100ML-% IV SOLN
2.0000 g | Freq: Once | INTRAVENOUS | Status: AC
  Administered 2021-02-15: 2000 mg via INTRAVENOUS
  Filled 2021-02-15: qty 100

## 2021-02-15 NOTE — Progress Notes (Signed)
[facility]  PROGRESS NOTE   Subjective:   Patient is comfortable.  Oral intake is poor. On IV fluids. Urine output is acceptable and is over 2 L yesterday. Renal indicis are improving.  Objective:  Vital signs in last 24 hours:  Temp:  [97.5 F (36.4 C)-98.5 F (36.9 C)] 98.1 F (36.7 C) (05/30 1152) Pulse Rate:  [76-93] 84 (05/30 1152) Resp:  [16-20] 17 (05/30 1152) BP: (102-139)/(52-69) 139/69 (05/30 1152) SpO2:  [93 %-98 %] 98 % (05/30 1152)  Weight change:  Filed Weights   02/11/21 1630  Weight: 68 kg    Intake/Output: I/O last 3 completed shifts: In: -  Out: 2275 [Urine:2275]   Intake/Output this shift:  Total I/O In: -  Out: 650 [Urine:650]  Physical Exam: General:  No acute distress  Head:  Normocephalic, atraumatic. Moist oral mucosal membranes  Eyes:  Anicteric  Neck:  Supple  Lungs:   Clear to auscultation, normal effort  Heart:  S1S2 no rubs  Abdomen:   Soft, nontender, bowel sounds present  Extremities:  peripheral edema.  Neurologic:  Awake, alert, following commands  Skin:  No lesions  Access:     Basic Metabolic Panel: Recent Labs  Lab 02/11/21 2104 02/12/21 0428 02/13/21 0536 02/14/21 0642 02/15/21 0911  NA 134* 136 139 140 138  K 4.9 4.2 3.8 3.8 3.4*  CL 104 106 102 98 91*  CO2 15* 16* 27 29 30   GLUCOSE 138* 122* 93 85 110*  BUN 108* 100* 82* 61* 49*  CREATININE 6.80* 5.72* 4.50* 3.98* 3.40*  CALCIUM 9.1 8.3* 7.4* 6.8* 6.3*  MG 1.9  --   --   --  1.3*    Liver Function Tests: Recent Labs  Lab 02/11/21 1057  AST 13*  ALT 10  ALKPHOS 62  BILITOT 1.2  PROT 7.7  ALBUMIN 3.5   No results for input(s): LIPASE, AMYLASE in the last 168 hours. No results for input(s): AMMONIA in the last 168 hours.  CBC: Recent Labs  Lab 02/11/21 1057 02/12/21 0428 02/15/21 0911  WBC 9.9 5.7 5.2  NEUTROABS 8.3*  --   --   HGB 10.9* 9.2* 8.5*  HCT 32.0* 26.8* 24.7*  MCV 88.6 87.3 88.5  PLT 241 195 154    Cardiac  Enzymes: No results for input(s): CKTOTAL, CKMB, CKMBINDEX, TROPONINI in the last 168 hours.  BNP: Invalid input(s): POCBNP  CBG: Recent Labs  Lab 02/14/21 1241 02/14/21 1755 02/14/21 2118 02/15/21 0758 02/15/21 1142  GLUCAP 75 90 86 88 103*    Microbiology: Results for orders placed or performed during the hospital encounter of 02/11/21  Urine culture     Status: None   Collection Time: 02/11/21 10:41 PM   Specimen: Urine, Random  Result Value Ref Range Status   Specimen Description   Final    URINE, RANDOM Performed at [facility], [address] [place], [place] [address]    Special Requests   Final    NONE Performed at [facility], [address] [place], [place] [address]    Culture   Final    NO GROWTH Performed at [facility], [place] [address], [place], [place] [address]    Report Status 02/13/2021 FINAL  Final  Resp Panel by RT-PCR (Flu A&B, Covid) Urine, Catheterized     Status: Abnormal   Collection Time: 02/11/21 10:41 PM   Specimen: Urine, Catheterized; Nasopharyngeal(NP) swabs in vial transport medium  Result Value Ref Range Status  SARS Coronavirus 2 by RT PCR POSITIVE (A) NEGATIVE Final    Comment: RESULT CALLED TO, READ BACK BY AND VERIFIED WITH: Leonides Schanz RN 0006 02/12/21 HNM (NOTE) SARS-CoV-2 target nucleic acids are DETECTED.  The SARS-CoV-2 RNA is generally detectable in upper respiratory specimens during the acute phase of infection. Positive results are indicative of the presence of the identified virus, but do not rule out bacterial infection or co-infection with other pathogens not detected by the test. Clinical correlation with patient history and other diagnostic information is necessary to determine patient infection status. The expected result is Negative.  Fact Sheet for Patients: EntrepreneurPulse.com.au  Fact Sheet for Healthcare  Providers: IncredibleEmployment.be  This test is not yet approved or cleared by the [place] FDA and  has been authorized for detection and/or diagnosis of SARS-CoV-2 by FDA under an Emergency Use Authorization (EUA).  This EUA will remain in effect (meaning this test can be  used) for the duration of  the COVID-19 declaration under Section 564(b)(1) of the Act, 21 U.S.C. section 360bbb-3(b)(1), unless the authorization is terminated or revoked sooner.     Influenza A by PCR NEGATIVE NEGATIVE Final   Influenza B by PCR NEGATIVE NEGATIVE Final    Comment: (NOTE) The Xpert Xpress SARS-CoV-2/FLU/RSV plus assay is intended as an aid in the diagnosis of influenza from Nasopharyngeal swab specimens and should not be used as a sole basis for treatment. Nasal washings and aspirates are unacceptable for Xpert Xpress SARS-CoV-2/FLU/RSV testing.  Fact Sheet for Patients: EntrepreneurPulse.com.au  Fact Sheet for Healthcare Providers: IncredibleEmployment.be  This test is not yet approved or cleared by the [place] FDA and has been authorized for detection and/or diagnosis of SARS-CoV-2 by FDA under an Emergency Use Authorization (EUA). This EUA will remain in effect (meaning this test can be used) for the duration of the COVID-19 declaration under Section 564(b)(1) of the Act, 21 U.S.C. section 360bbb-3(b)(1), unless the authorization is terminated or revoked.  Performed at [facility], [address] [place], [place] [address]     Coagulation Studies: No results for input(s): LABPROT, INR in the last 72 hours.  Urinalysis: No results for input(s): COLORURINE, LABSPEC, PHURINE, GLUCOSEU, HGBUR, BILIRUBINUR, KETONESUR, PROTEINUR, UROBILINOGEN, NITRITE, LEUKOCYTESUR in the last 72 hours.  Invalid input(s): APPERANCEUR    Imaging: No results found.   Medications:   . sodium chloride 75 mL/hr at 02/14/21  2336  . calcium gluconate    . magnesium sulfate bolus IVPB    .  sodium bicarbonate (isotonic) infusion in sterile water 125 mL/hr at 02/15/21 0608   . calcium carbonate  500 mg of elemental calcium Oral BID WC  . Chlorhexidine Gluconate Cloth  6 each Topical Daily  . heparin  5,000 Units Subcutaneous Q8H  . insulin aspart  0-5 Units Subcutaneous QHS  . insulin aspart  0-9 Units Subcutaneous TID WC  . midodrine  5 mg Oral TID WC  . tamsulosin  0.4 mg Oral QPC supper    Assessment/ Plan:     Principal Problem:   Acute kidney injury superimposed on CKD II (HCC) Active Problems:   Hypertension, goal below 140/90   Diabetes mellitus type 2, controlled, without complications (HCC)   BPH with obstruction/lower urinary tract symptoms   Generalized weakness   Increased anion gap metabolic acidosis   Abnormal chest CT  70 year old male with history of hypertension, coronary artery disease, diabetes, peripheral vascular disease, BPH, CKD now admitted with history of weight loss, cough, shortness  of breath and found to have significant metabolic acidosis, hypotension and acute kidney injury on top of chronic kidney disease.  #1 AKI: Most likely due to prerenal azotemia complicated by post post renal issues. Renal sonogram is negative. Patient had a Foley catheter placed. Renal indices are slowly improving with IV fluid resuscitation. We will continue 75 cc an hour with isotonic saline.. Will avoid nephrotoxic medications including IV contrast.  #2 anemia, possibly secondary to CKD  #3 hypocalcemia, possibly due to secondary hyperparathyroidism.  We will continue him on calcium carbonate 1200 mg twice a day.  #4 COVID infection, stable at this time.  #5 hypokalemia supplement Potassium as ordered.  We will continue to monitor closely.   LOS: [place], MD [facility] 5/30/20222:37 PM

## 2021-02-15 NOTE — Progress Notes (Signed)
Pt complained mid shift that he was having chest pains noting pain under left breast bone. VSS and Pt felt better when sat up. Pt stated it may be heartburn. Pt had spaghetti and meat sauce for lunch yesterday. This AM Pt. again complained of epigastric pain and had some dry heaves, Pt sat up in the bed and noted he felt better. Maalox ordered from the standing orders to be administered. Continued to monitor.

## 2021-02-15 NOTE — TOC Initial Note (Signed)
Transition of Care [facility]) - Initial/Assessment Note    Patient Details  Name: Scott Woods MRN: [id number] Date of Birth: August 01, 1951  [facility] CM/SW Contact:    Kerin Salen, RN Phone Number: 02/15/2021, 12:39 PM  Clinical Narrative: Unable to speak with patient, called sister Coralyn Mark, who says patient lives alone, his friend was incarcerated in December. Patient drives and able to shop and cook, however not able to eat due to not having dentures and depression due to friend being incarcerated. Sister says patient uses Nature conservation officer, and was told by Urologist that he had a 20% chance of Kidney Cancer due to a mass that is scheduled for Biopsy in two days. Sister feels that patient will refuse SNF, he wants to go home. Will have RN to have patient to call me.  I spoke with patient who says he did not want to go to SNF, however will agree with [facility] PT. Patient says he needs to pay Rent and take care of his car. Patient also says he did not want rolling walker will prefer a Cane because he has to climb 5 steps to his home and unable to do this with rolling walker. [facility] agrees to provide [facility] PT for patient.  TOC will continue to track for discharge needs.                  Expected Discharge Plan: [place] Barriers to Discharge: Continued Medical Work up   Patient Goals and CMS Choice Patient states their goals for this hospitalization and ongoing recovery are:: Home with Home Health.   Choice offered to / list presented to : Patient  Expected Discharge Plan and Services Expected Discharge Plan: [place] In-house Referral: Clinical Social Work   Post Acute Care Choice: NA Living arrangements for the past 2 months: [place]: PT [place]: [place] ([facility]) Date [place]: 02/15/21 Time [place]: 1237 Representative spoke with at [place]: Floydene Flock  Prior Living Arrangements/Services Living arrangements for the past 2 months: Apartment Lives with:: Self Patient language and need for interpreter reviewed:: Yes Do you feel safe going back to the place where you live?: Yes      Need for Family Participation in Patient Care: No (Comment) Care giver support system in place?: Yes (comment)   Criminal Activity/Legal Involvement Pertinent to Current Situation/Hospitalization: No - Comment as needed  Activities of Daily Living Home Assistive Devices/Equipment: None,Grab bars in shower,Grab bars around toilet,Eyeglasses ADL Screening (condition at time of admission) Patient's cognitive ability adequate to safely complete daily activities?: Yes Is the patient deaf or have difficulty hearing?: No Does the patient have difficulty seeing, even when wearing glasses/contacts?: No Does the patient have difficulty concentrating, remembering, or making decisions?: No Patient able to express need for assistance with ADLs?: Yes Does the patient have difficulty dressing or bathing?: No Independently performs ADLs?: Yes (appropriate for developmental age) Does the patient have difficulty walking or climbing stairs?: Yes Weakness of Legs: None Weakness of Arms/Hands: None  Permission Sought/Granted                  Emotional Assessment Appearance:: Other (Comment Required (Phone assessment due to Covid isolation.) Attitude/Demeanor/Rapport: Unable to Assess Affect (typically observed): Unable  to Assess Orientation: : Oriented to Self,Oriented to Place,Oriented to Situation Alcohol / Substance Use: Not Applicable Psych Involvement: Outpatient Provider  Admission diagnosis:  Urinary retention [R33.9] AKI (acute kidney injury) ([place]) [N17.9] Benign prostatic hyperplasia with urinary retention [N40.1, R33.8] Patient Active Problem List   Diagnosis Date Noted  . Increased anion gap metabolic acidosis 36/14/4315  .  Abnormal chest CT 02/12/2021  . Acute kidney injury superimposed on CKD II ([place]) 02/11/2021  . Generalized weakness 02/11/2021  . Current mild episode of major depressive disorder ([place]) 10/02/2020  . BPH with obstruction/lower urinary tract symptoms 04/01/2019  . Type 2 diabetes mellitus with microalbuminuria, without long-term current use of insulin ([place]) 11/16/2017  . Adrenal adenoma 05/08/2015  . Chronic kidney disease (CKD), stage II (mild) 05/08/2015  . Abnormal prostate specific antigen 05/08/2015  . Hyperlipidemia LDL goal <100 05/08/2015  . Hypertension, goal below 140/90 05/08/2015  . Diabetes mellitus type 2, controlled, without complications ([place]) 40/04/6760  . Overweight (BMI 25.0-29.9) 05/08/2015   PCP:  [facility], [place]:   [facility] - [place], [place] [address] Phone: [phone] Fax: [phone]  [id number] - [place], [place] - [address] [place] [address] Phone: [phone] Fax: [phone]     Social Determinants of Health (SDOH) Interventions    Readmission Risk Interventions No flowsheet data found.

## 2021-02-15 NOTE — Progress Notes (Signed)
In to give medication to patient therapy in to work with patient.  Patient stood up and flopped back down on bed.  Stated he was just too weak.  When therapy left patient stated he was going to stay here until he would be able to drive himself home.

## 2021-02-15 NOTE — Progress Notes (Addendum)
PHARMACY CONSULT NOTE - FOLLOW UP  Pharmacy Consult for Electrolyte Monitoring and Replacement   Recent Labs: Potassium (mmol/L)  Date Value  02/15/2021 3.4 (L)   Magnesium (mg/dL)  Date Value  02/15/2021 1.3 (L)   Calcium (mg/dL)  Date Value  02/15/2021 6.3 (LL)   Albumin (g/dL)  Date Value  02/11/2021 3.5   Sodium (mmol/L)  Date Value  02/15/2021 138     Assessment: 69 y.o.malewith medical history significant for BPH with lower urinary obstructive symptoms currently undergoing evaluation by urology, elevated PSA insulin-dependent type 2 diabetes, HTN and stage II CKD, who was sent from his PCPs office for abnormal kidney function. Pharmacy has been consulted for electrolyte management.  Goal of Therapy:  Electrolytes WNL  Plan:   K 3.4 - MD replaced with 20 mEq PO KCl x1 dose  Ca 6.3 - ordered 2g IV Ca gluconate; pt also has orders for Ca carbonate BID with meals   Mg 1.3 - ordered 4g IV Mg sulfate  Re-check electrolytes with AM labs  Sherilyn Banker, PharmD Pharmacy Resident  02/15/2021 2:09 PM

## 2021-02-15 NOTE — Progress Notes (Addendum)
PROGRESS NOTE    Scott Woods  OAC:[id number] DOB: Jul 22, 1951 DOA: 02/11/2021 PCP: [facility]    Brief Narrative:  Scott Woods is a 70 y.o. male with medical history significant for BPH with lower urinary obstructive symptoms currently undergoing evaluation by urology, elevated PSA insulin-dependent type 2 diabetes, HTN and stage II CKD, who was sent from his PCPs office for abnormal kidney function.  Patient had been complaining of generalized weakness and weight loss of over 20 pounds for the past 3 months and prostate biopsy was being arranged.  He has nonproductive cough but denies shortness of breath or chest pain and denies fever or chills.  Has no nausea, vomiting, abdominal pain or change in bowel habits. ED course: On arrival afebrile, BP 99/69 with pulse of 110 O2 sat 100% on room air blood work significant for creatinine of 6.8 above baseline of 1.6 just 4 months prior.  Anion gap was 18 and bicarb 11.  WBC normal hemoglobin 10.9 which was his baseline.  5/30 has no shortness of breath, chest pain, or diarrhea.  He is trying to hydrate.  Does not appear patient is taking too much p.o. intake due to having no dentures. Her friend is checking on his new dentures. He is trying to hydrate  Consultants:   cardiology  Procedures:   Antimicrobials:      Subjective: As above. No n/v  Objective: Vitals:   02/14/21 2326 02/15/21 0418 02/15/21 0604 02/15/21 0804  BP: 118/67 (!) 102/52 (!) 111/59 115/62  Pulse: 93 84 82 76  Resp: 18 18 18    Temp: (!) 97.5 F (36.4 C) 98.4 F (36.9 C) 97.6 F (36.4 C) 97.7 F (36.5 C)  TempSrc: Oral Oral Oral Oral  SpO2: 96% 95% 98% 96%  Weight:      Height:        Intake/Output Summary (Last 24 hours) at 02/15/2021 0827 Last data filed at 02/15/2021 0418 Gross per 24 hour  Intake --  Output 1775 ml  Net -1775 ml   Filed Weights   02/11/21 1630  Weight: 68 kg    Examination: Calm, NAD CTA no wheeze rales  rhonchi's Regular S1-S2 no gallops Soft benign positive bowel sounds Trace pedal edema bilaterally Awake and alert grossly intact Mood and affect appropriate in current setting    Data Reviewed: I have personally reviewed following labs and imaging studies  CBC: Recent Labs  Lab 02/11/21 1057 02/12/21 0428  WBC 9.9 5.7  NEUTROABS 8.3*  --   HGB 10.9* 9.2*  HCT 32.0* 26.8*  MCV 88.6 87.3  PLT 241 010   Basic Metabolic Panel: Recent Labs  Lab 02/11/21 1057 02/11/21 2104 02/12/21 0428 02/13/21 0536 02/14/21 0642  NA 134* 134* 136 139 140  K 4.7 4.9 4.2 3.8 3.8  CL 105 104 106 102 98  CO2 11* 15* 16* 27 29  GLUCOSE 120* 138* 122* 93 85  BUN 112* 108* 100* 82* 61*  CREATININE 6.95* 6.80* 5.72* 4.50* 3.98*  CALCIUM 8.8* 9.1 8.3* 7.4* 6.8*  MG  --  1.9  --   --   --    GFR: Estimated Creatinine Clearance: 17.1 mL/min (A) (by C-G formula based on SCr of 3.98 mg/dL (H)). Liver Function Tests: Recent Labs  Lab 02/11/21 1057  AST 13*  ALT 10  ALKPHOS 62  BILITOT 1.2  PROT 7.7  ALBUMIN 3.5   No results for input(s): LIPASE, AMYLASE in the last 168 hours. No results  for input(s): AMMONIA in the last 168 hours. Coagulation Profile: No results for input(s): INR, PROTIME in the last 168 hours. Cardiac Enzymes: No results for input(s): CKTOTAL, CKMB, CKMBINDEX, TROPONINI in the last 168 hours. BNP (last 3 results) No results for input(s): PROBNP in the last 8760 hours. HbA1C: No results for input(s): HGBA1C in the last 72 hours. CBG: Recent Labs  Lab 02/14/21 0804 02/14/21 1241 02/14/21 1755 02/14/21 2118 02/15/21 0758  GLUCAP 78 75 90 86 88   Lipid Profile: No results for input(s): CHOL, HDL, LDLCALC, TRIG, CHOLHDL, LDLDIRECT in the last 72 hours. Thyroid Function Tests: No results for input(s): TSH, T4TOTAL, FREET4, T3FREE, THYROIDAB in the last 72 hours. Anemia Panel: No results for input(s): VITAMINB12, FOLATE, FERRITIN, TIBC, IRON, RETICCTPCT in the  last 72 hours. Sepsis Labs: Recent Labs  Lab 02/12/21 0428  PROCALCITON 0.30    Recent Results (from the past 240 hour(s))  Urine culture     Status: None   Collection Time: 02/11/21 10:41 PM   Specimen: Urine, Random  Result Value Ref Range Status   Specimen Description   Final    URINE, RANDOM Performed at [facility], [address], [place], [place] [address]    Special Requests   Final    NONE Performed at [facility], [address] [place], [place] [address]    Culture   Final    NO GROWTH Performed at [facility], [place] [address], [place], [place] [address]    Report Status 02/13/2021 FINAL  Final  Resp Panel by RT-PCR (Flu A&B, Covid) Urine, Catheterized     Status: Abnormal   Collection Time: 02/11/21 10:41 PM   Specimen: Urine, Catheterized; Nasopharyngeal(NP) swabs in vial transport medium  Result Value Ref Range Status   SARS Coronavirus 2 by RT PCR POSITIVE (A) NEGATIVE Final    Comment: RESULT CALLED TO, READ BACK BY AND VERIFIED WITH: Leonides Schanz RN 0006 02/12/21 HNM (NOTE) SARS-CoV-2 target nucleic acids are DETECTED.  The SARS-CoV-2 RNA is generally detectable in upper respiratory specimens during the acute phase of infection. Positive results are indicative of the presence of the identified virus, but do not rule out bacterial infection or co-infection with other pathogens not detected by the test. Clinical correlation with patient history and other diagnostic information is necessary to determine patient infection status. The expected result is Negative.  Fact Sheet for Patients: EntrepreneurPulse.com.au  Fact Sheet for Healthcare Providers: IncredibleEmployment.be  This test is not yet approved or cleared by the [place] FDA and  has been authorized for detection and/or diagnosis of SARS-CoV-2 by FDA under an Emergency Use Authorization (EUA).  This EUA will remain in effect  (meaning this test can be  used) for the duration of  the COVID-19 declaration under Section 564(b)(1) of the Act, 21 U.S.C. section 360bbb-3(b)(1), unless the authorization is terminated or revoked sooner.     Influenza A by PCR NEGATIVE NEGATIVE Final   Influenza B by PCR NEGATIVE NEGATIVE Final    Comment: (NOTE) The Xpert Xpress SARS-CoV-2/FLU/RSV plus assay is intended as an aid in the diagnosis of influenza from Nasopharyngeal swab specimens and should not be used as a sole basis for treatment. Nasal washings and aspirates are unacceptable for Xpert Xpress SARS-CoV-2/FLU/RSV testing.  Fact Sheet for Patients: EntrepreneurPulse.com.au  Fact Sheet for Healthcare Providers: IncredibleEmployment.be  This test is not yet approved or cleared by the [place] FDA and has been authorized for detection and/or diagnosis  of SARS-CoV-2 by FDA under an Emergency Use Authorization (EUA). This EUA will remain in effect (meaning this test can be used) for the duration of the COVID-19 declaration under Section 564(b)(1) of the Act, 21 U.S.C. section 360bbb-3(b)(1), unless the authorization is terminated or revoked.  Performed at [facility], [address], [place], [place] [address]          Radiology Studies: No results found.      Scheduled Meds: . calcium carbonate  500 mg of elemental calcium Oral BID WC  . Chlorhexidine Gluconate Cloth  6 each Topical Daily  . heparin  5,000 Units Subcutaneous Q8H  . insulin aspart  0-5 Units Subcutaneous QHS  . insulin aspart  0-9 Units Subcutaneous TID WC  . midodrine  5 mg Oral TID WC  . tamsulosin  0.4 mg Oral QPC supper   Continuous Infusions: . sodium chloride 75 mL/hr at 02/14/21 2336  .  sodium bicarbonate (isotonic) infusion in sterile water 125 mL/hr at 02/15/21 0608    Assessment & Plan:   Principal Problem:   Acute kidney injury superimposed on CKD II ([place]) Active  Problems:   Hypertension, goal below 140/90   Diabetes mellitus type 2, controlled, without complications (HCC)   BPH with obstruction/lower urinary tract symptoms   Generalized weakness   Increased anion gap metabolic acidosis   Abnormal chest CT   70 year old male with history of BPH with lower urinary obstructive symptoms currently undergoing evaluation by urology, elevated PSA insulin-dependent type 2 diabetes, HTN and stage II CKD, who was sent from his PCPs office for weakness and abnormal kidney function.     Acute kidney injury superimposed on CKD II (HCC)   Increased anion gap metabolic acidosis   BPH with obstruction/lower urinary tract symptoms - Suspect combination of post renal, as well as prerenal given recent history of weight loss and poor oral intake/prerenal -CT renal stone study with prostatomegaly and bladder diverticuli, possible underlying component of bladder outlet obstruction   Foley in place  Renal ultrasound no acute renal abnormality  no immediate need for dialysis  Avoid nephrotoxic medications, avoid hypotension, avoid IV contrast Urologist following Continue tamsulosin Continue Foley catheter for 7 to 10 days with plans for outpatient voiding trial (scheduled) Rescheduling upcoming prostate biopsy given indwelling Foley catheter for 1 week following biopsy (scheduled) 5/30-renal function slowly improving, 3.40 today Nephrology following Will continue IV fluids    Hypokalemia-mild K3.4 We will replace with 20 mEq KCl p.o.       Generalized weakness/weight loss - Uncertain primary etiology. covid infection likely attributing to weakness. cxr without acute dz 5/30 continue IV fluids  Encourage p.o. intake  PT OT recommend SNF    Hypocalcemia- Calcium 6.3 Will check Icalcium, and replenish Consulted pharmacy for electrolyte mx   Hypertension- bp nml-low Hold bp meds Continue midodrine    Diabetes mellitus type 2, controlled, without  complications (HCC) BG stable Continue R-ISS    Abnormal CT/Covid 19 infection Patchy and nodular airspace disease right lower lobe. Imaging features are compatible with pneumonia, possibly atypical etiology. Follow-up CT chest without contrast in 3 months could be used to ensure resolution On room air  DVT prophylaxis: heparin Code Status:full Family Communication: none at bedside  Status is: Inpatient  Remains inpatient appropriate because:Hemodynamically unstable and Inpatient level of care appropriate due to severity of illness   Dispo: The patient is from: Home              Anticipated  d/c is to: SNF              Patient currently is not medically stable to d/c.   Difficult to place patient No            LOS: 4 days   Time spent: 45 min with >50% on coc    Nolberto Hanlon, MD [facility] Pager [phone]  If 7PM-7AM, please contact night-coverage 02/15/2021, 8:27 AM

## 2021-02-15 NOTE — Evaluation (Signed)
Occupational Therapy Evaluation Patient Details Name: Scott Woods MRN: [id number] DOB: September 24, 1950 Today's Date: 02/15/2021    History of Present Illness Pt is a 70 y.o. male with medical history significant for BPH with lower urinary obstructive symptoms currently undergoing evaluation by urology, elevated PSA insulin-dependent type 2 diabetes, HTN and stage II CKD, who was sent from his PCPs office for abnormal kidney function.  MD assessment includes: Acute kidney injury superimposed on CKD II, generalized weakness, HTN, and Covid 19 infection.   Clinical Impression   Pt seen for OT evaluation this date in setting of acute hospitalization d/t COVID. Pt reports being INDEP and living alone at baseline. Pt presents this date with weakness, decreased balance, decreased fxl activity tolerance and decreased safety awareness/lack of insight into deficits impacting his ability to safely and efficiently complete ADLs/ADL mobility. Pt requires MIN A for ADL transfers and  SETUP for seated UB ADLs, MIN/MOD for seated LB ADLs, MIN/CGA for standing balance with RW for standing ADLs. MIN A for ADL transfers. Pt not safe to return to living I'ly at this time from OT standpoint and presents high fall risk given his posterior LOB during OT session. Pt will require continued services both acutely and in STR setting upon d/c.     Follow Up Recommendations  SNF    Equipment Recommendations  Other (comment) (defer)    Recommendations for Other Services       Precautions / Restrictions Precautions Precautions: Fall Restrictions Weight Bearing Restrictions: No      Mobility Bed Mobility Overal bed mobility: Modified Independent             General bed mobility comments: Extra time and effort, HOB elevated, use of bed rails    Transfers Overall transfer level: Needs assistance Equipment used: Rolling walker (2 wheeled) Transfers: Sit to/from Stand Sit to Stand: Min assist;Mod assist          General transfer comment: cues for safe use of RW, encouraged to use RW as he reports he doesn't need it. Pt with poor safety awarenss, on initial CTS, does not want assist and is noted to have LOB posteriorly landing back on EOB, unable to control descent. OT assists on next trial and pt requires MIN/MOD A to come from standard bed height.    Balance Overall balance assessment: Needs assistance   Sitting balance-Leahy Scale: Good   Postural control: Posterior lean Standing balance support: Bilateral upper extremity supported;During functional activity Standing balance-Leahy Scale: Fair Standing balance comment: requires UE support, loses balance posteriorly w/o assistance                           ADL either performed or assessed with clinical judgement   ADL Overall ADL's : Needs assistance/impaired                                       General ADL Comments: SETUP for seated UB ADLs, MIN/MOD for seated LB ADLs, MIN/CGA for standing balance with RW for standing ADLs. MIN A for ADL transfers.     Vision Patient Visual Report: No change from baseline       Perception     Praxis      Pertinent Vitals/Pain Pain Assessment: No/denies pain     Hand Dominance     Extremity/Trunk Assessment Upper Extremity Assessment Upper Extremity Assessment: Generalized  weakness   Lower Extremity Assessment Lower Extremity Assessment: Generalized weakness       Communication Communication Communication: No difficulties   Cognition Arousal/Alertness: Awake/alert Behavior During Therapy: WFL for tasks assessed/performed Overall Cognitive Status: Within Functional Limits for tasks assessed                                     General Comments       Exercises Other Exercises Other Exercises: safety ed, correct use of RW   Shoulder Instructions      Home Living Family/patient expects to be discharged to:: Private residence Living  Arrangements: Alone Available Help at Discharge: Family;Available PRN/intermittently Type of Home: House Home Access: Stairs to enter [employer] of Steps: 3 Entrance Stairs-Rails: Right;Left;Can reach both Home Layout: One level     Bathroom Shower/Tub: Teacher, early years/pre: Standard     Home Equipment: Bedside commode          Prior Functioning/Environment Level of Independence: Independent        Comments: Ind amb community distances without an AD, Ind with ADLs, no fall history        OT Problem List: Decreased strength;Decreased activity tolerance;Impaired balance (sitting and/or standing);Decreased safety awareness;Decreased knowledge of use of DME or AE;Cardiopulmonary status limiting activity      OT Treatment/Interventions: Self-care/ADL training;DME and/or AE instruction;Therapeutic activities;Balance training;Therapeutic exercise;Patient/family education    OT Goals(Current goals can be found in the care plan section) Acute Rehab OT Goals Patient Stated Goal: To return home OT Goal Formulation: With patient Time For Goal Achievement: 03/01/21 Potential to Achieve Goals: Good ADL Goals Pt Will Perform Lower Body Dressing: with supervision Pt Will Transfer to Toilet: with supervision Pt Will Perform Toileting - Clothing Manipulation and hygiene: with supervision  OT Frequency: Min 1X/week   Barriers to D/C:            Co-evaluation              AM-PAC OT "6 Clicks" Daily Activity     Outcome Measure Help from another person eating meals?: None Help from another person taking care of personal grooming?: A Little Help from another person toileting, which includes using toliet, bedpan, or urinal?: A Lot Help from another person bathing (including washing, rinsing, drying)?: A Lot Help from another person to put on and taking off regular upper body clothing?: A Little Help from another person to put on and taking off regular  lower body clothing?: A Lot 6 Click Score: 16   End of Session Equipment Utilized During Treatment: Gait belt;Rolling walker Nurse Communication: Mobility status  Activity Tolerance: Patient tolerated treatment well Patient left: in bed;with call bell/phone within reach;with bed alarm set;with nursing/sitter in room (nurse prepping to administer meds)  OT Visit Diagnosis: Unsteadiness on feet (R26.81);Muscle weakness (generalized) (M62.81)                Time: 4765-4650 OT Time Calculation (min): 16 min Charges:  OT General Charges $OT Visit: 1 Visit OT Evaluation $OT Eval Moderate Complexity: 1 Mod OT Treatments $Self Care/Home Management : 8-22 mins  Gerrianne Scale, MS, OTR/L ascom [phone] 02/15/21, 4:40 PM

## 2021-02-16 DIAGNOSIS — N189 Chronic kidney disease, unspecified: Secondary | ICD-10-CM | POA: Diagnosis not present

## 2021-02-16 DIAGNOSIS — N179 Acute kidney failure, unspecified: Secondary | ICD-10-CM | POA: Diagnosis not present

## 2021-02-16 LAB — BASIC METABOLIC PANEL
Anion gap: 15 (ref 5–15)
BUN: 39 mg/dL — ABNORMAL HIGH (ref 8–23)
CO2: 33 mmol/L — ABNORMAL HIGH (ref 22–32)
Calcium: 7 mg/dL — ABNORMAL LOW (ref 8.9–10.3)
Chloride: 89 mmol/L — ABNORMAL LOW (ref 98–111)
Creatinine, Ser: 3.02 mg/dL — ABNORMAL HIGH (ref 0.61–1.24)
GFR, Estimated: 22 mL/min — ABNORMAL LOW (ref >60)
Glucose, Bld: 143 mg/dL — ABNORMAL HIGH (ref 70–99)
Potassium: 3.8 mmol/L (ref 3.5–5.1)
Sodium: 137 mmol/L (ref 135–145)

## 2021-02-16 LAB — GLUCOSE, CAPILLARY
Glucose-Capillary: 125 mg/dL — ABNORMAL HIGH (ref 70–99)
Glucose-Capillary: 126 mg/dL — ABNORMAL HIGH (ref 70–99)
Glucose-Capillary: 166 mg/dL — ABNORMAL HIGH (ref 70–99)
Glucose-Capillary: <10 mg/dL — CL (ref 70–99)
Glucose-Capillary: 113 mg/dL — ABNORMAL HIGH (ref 70–99)

## 2021-02-16 LAB — PHOSPHORUS: Phosphorus: 4.5 mg/dL (ref 2.5–4.6)

## 2021-02-16 LAB — MAGNESIUM: Magnesium: 2.1 mg/dL (ref 1.7–2.4)

## 2021-02-16 MED ORDER — CALCIUM CARBONATE 1250 (500 CA) MG PO TABS
500.0000 mg | ORAL_TABLET | Freq: Three times a day (TID) | ORAL | Status: DC
  Administered 2021-02-16 – 2021-02-19 (×10): 500 mg via ORAL
  Filled 2021-02-16 (×12): qty 1

## 2021-02-16 MED ORDER — GLUCERNA SHAKE PO LIQD
237.0000 mL | Freq: Three times a day (TID) | ORAL | Status: DC
  Administered 2021-02-17 – 2021-02-19 (×5): 237 mL via ORAL

## 2021-02-16 NOTE — Progress Notes (Signed)
[facility]  PROGRESS NOTE   Subjective:   Patient seen laying in bed Alert and oriented States his appetite has improved. Per nursing, he has poor appetite Denies nausea and vomiting Denies shortness of breath   Objective:  Vital signs in last 24 hours:  Temp:  [97.5 F (36.4 C)-98.5 F (36.9 C)] 98.1 F (36.7 C) (05/30 1152) Pulse Rate:  [76-93] 84 (05/30 1152) Resp:  [16-20] 17 (05/30 1152) BP: (102-139)/(52-69) 139/69 (05/30 1152) SpO2:  [93 %-98 %] 98 % (05/30 1152)  Weight change:  Filed Weights   02/11/21 1630  Weight: 68 kg    Intake/Output: I/O last 3 completed shifts: In: -  Out: 2275 [Urine:2275]   Intake/Output this shift:  Total I/O In: -  Out: 600 [Urine:600]  Physical Exam: General:  No acute distress  Head:  Normocephalic, atraumatic. Moist oral mucosal membranes  Eyes:  Anicteric  Neck:  Supple  Lungs:   Clear to auscultation, normal effort  Heart:  S1S2 no rubs  Abdomen:   Soft, nontender, bowel sounds present  Extremities:  trace peripheral edema.  Neurologic:  Awake, alert, following commands  Skin:  No lesions       Basic Metabolic Panel: Recent Labs  Lab 02/11/21 2104 02/12/21 0428 02/13/21 0536 02/14/21 0642 02/15/21 0911  NA 134* 136 139 140 138  K 4.9 4.2 3.8 3.8 3.4*  CL 104 106 102 98 91*  CO2 15* 16* 27 29 30   GLUCOSE 138* 122* 93 85 110*  BUN 108* 100* 82* 61* 49*  CREATININE 6.80* 5.72* 4.50* 3.98* 3.40*  CALCIUM 9.1 8.3* 7.4* 6.8* 6.3*  MG 1.9  --   --   --  1.3*    Liver Function Tests: Recent Labs  Lab 02/11/21 1057  AST 13*  ALT 10  ALKPHOS 62  BILITOT 1.2  PROT 7.7  ALBUMIN 3.5   No results for input(s): LIPASE, AMYLASE in the last 168 hours. No results for input(s): AMMONIA in the last 168 hours.  CBC: Recent Labs  Lab 02/11/21 1057 02/12/21 0428 02/15/21 0911  WBC 9.9 5.7 5.2  NEUTROABS 8.3*  --   --   HGB 10.9* 9.2* 8.5*  HCT 32.0* 26.8* 24.7*  MCV 88.6 87.3 88.5  PLT  241 195 154    Cardiac Enzymes: No results for input(s): CKTOTAL, CKMB, CKMBINDEX, TROPONINI in the last 168 hours.  BNP: Invalid input(s): POCBNP  CBG: Recent Labs  Lab 02/14/21 1241 02/14/21 1755 02/14/21 2118 02/15/21 0758 02/15/21 1142  GLUCAP 75 90 86 88 103*    Microbiology: Results for orders placed or performed during the hospital encounter of 02/11/21  Urine culture     Status: None   Collection Time: 02/11/21 10:41 PM   Specimen: Urine, Random  Result Value Ref Range Status   Specimen Description   Final    URINE, RANDOM Performed at [facility], [address], [place], [place] [address]    Special Requests   Final    NONE Performed at [facility], [address], [place], [place] [address]    Culture   Final    NO GROWTH Performed at [facility], [place] [address], [place],  [address]    Report Status 02/13/2021 FINAL  Final  Resp Panel by RT-PCR (Flu A&B, Covid) Urine, Catheterized     Status: Abnormal   Collection Time: 02/11/21 10:41 PM   Specimen: Urine, Catheterized; Nasopharyngeal(NP) swabs in vial transport medium  Result  Value Ref Range Status   SARS Coronavirus 2 by RT PCR POSITIVE (A) NEGATIVE Final    Comment: RESULT CALLED TO, READ BACK BY AND VERIFIED WITH: Leonides Schanz RN 0006 02/12/21 HNM (NOTE) SARS-CoV-2 target nucleic acids are DETECTED.  The SARS-CoV-2 RNA is generally detectable in upper respiratory specimens during the acute phase of infection. Positive results are indicative of the presence of the identified virus, but do not rule out bacterial infection or co-infection with other pathogens not detected by the test. Clinical correlation with patient history and other diagnostic information is necessary to determine patient infection status. The expected result is Negative.  Fact Sheet for Patients: EntrepreneurPulse.com.au  Fact Sheet for Healthcare  Providers: IncredibleEmployment.be  This test is not yet approved or cleared by the [place] FDA and  has been authorized for detection and/or diagnosis of SARS-CoV-2 by FDA under an Emergency Use Authorization (EUA).  This EUA will remain in effect (meaning this test can be  used) for the duration of  the COVID-19 declaration under Section 564(b)(1) of the Act, 21 U.S.C. section 360bbb-3(b)(1), unless the authorization is terminated or revoked sooner.     Influenza A by PCR NEGATIVE NEGATIVE Final   Influenza B by PCR NEGATIVE NEGATIVE Final    Comment: (NOTE) The Xpert Xpress SARS-CoV-2/FLU/RSV plus assay is intended as an aid in the diagnosis of influenza from Nasopharyngeal swab specimens and should not be used as a sole basis for treatment. Nasal washings and aspirates are unacceptable for Xpert Xpress SARS-CoV-2/FLU/RSV testing.  Fact Sheet for Patients: EntrepreneurPulse.com.au  Fact Sheet for Healthcare Providers: IncredibleEmployment.be  This test is not yet approved or cleared by the [place] FDA and has been authorized for detection and/or diagnosis of SARS-CoV-2 by FDA under an Emergency Use Authorization (EUA). This EUA will remain in effect (meaning this test can be used) for the duration of the COVID-19 declaration under Section 564(b)(1) of the Act, 21 U.S.C. section 360bbb-3(b)(1), unless the authorization is terminated or revoked.  Performed at [facility], [address] [place], [place] [address]     Coagulation Studies: No results for input(s): LABPROT, INR in the last 72 hours.  Urinalysis: No results for input(s): COLORURINE, LABSPEC, PHURINE, GLUCOSEU, HGBUR, BILIRUBINUR, KETONESUR, PROTEINUR, UROBILINOGEN, NITRITE, LEUKOCYTESUR in the last 72 hours.  Invalid input(s): APPERANCEUR    Imaging: No results found.   Medications:   . sodium chloride 75 mL/hr at 02/14/21  2336  . calcium gluconate    . magnesium sulfate bolus IVPB    .  sodium bicarbonate (isotonic) infusion in sterile water 125 mL/hr at 02/15/21 0608   . calcium carbonate  500 mg of elemental calcium Oral BID WC  . Chlorhexidine Gluconate Cloth  6 each Topical Daily  . heparin  5,000 Units Subcutaneous Q8H  . insulin aspart  0-5 Units Subcutaneous QHS  . insulin aspart  0-9 Units Subcutaneous TID WC  . midodrine  5 mg Oral TID WC  . tamsulosin  0.4 mg Oral QPC supper    Assessment/ Plan:     Principal Problem:   Acute kidney injury superimposed on CKD II (HCC) Active Problems:   Hypertension, goal below 140/90   Diabetes mellitus type 2, controlled, without complications (HCC)   BPH with obstruction/lower urinary tract symptoms   Generalized weakness   Increased anion gap metabolic acidosis   Abnormal chest CT  70 year old male with history of hypertension, coronary artery disease, diabetes, peripheral vascular disease, BPH, CKD now admitted with  history of weight loss, cough, shortness of breath and found to have significant metabolic acidosis, hypotension and acute kidney injury on top of chronic kidney disease.  #1 AKI: Most likely due to prerenal azotemia complicated by post renal issues. Renal sonogram is negative. Patient had a Foley catheter placed. Renal indices are slowly improving with IV fluid resuscitation. We Continue NS IVF  D/C sodium bicarb IVF Will avoid nephrotoxic medications including IV contrast.  #2 anemia, possibly secondary to CKD Hgb 8.5 Will continue to monitor  #3 hypocalcemia, possibly due to secondary hyperparathyroidism.   Calcium improved to 7.0 Continue him on calcium carbonate 1200 mg BID.  #4 COVID infection, stable at this time. Room air  #5 Hypokalemia supplement Potassium as ordered Will prescribe K 27mEq po daily.     LOS: 5 Colon Flattery, MD [facility] 5/31/202212:47 PM

## 2021-02-16 NOTE — Progress Notes (Signed)
PHARMACY CONSULT NOTE - FOLLOW UP  Pharmacy Consult for Electrolyte Monitoring and Replacement   Recent Labs: Potassium (mmol/L)  Date Value  02/16/2021 3.8   Magnesium (mg/dL)  Date Value  02/16/2021 2.1   Calcium (mg/dL)  Date Value  02/16/2021 7.0 (L)   Albumin (g/dL)  Date Value  02/11/2021 3.5   Phosphorus (mg/dL)  Date Value  02/16/2021 4.5   Sodium (mmol/L)  Date Value  02/16/2021 137   Corrected Calcium 7.40  Assessment: 69 y.o.malewith medical history significant for BPH with lower urinary obstructive symptoms currently undergoing evaluation by urology, elevated PSA insulin-dependent type 2 diabetes, HTN and stage II CKD, who was sent from his PCPs office for abnormal kidney function. Pharmacy has been consulted for electrolyte management.  Goal of Therapy:  Electrolytes WNL  Plan:   K 3.8 - no replenishment warranted today  Corrected Ca 7.40 - will increase Ca carbonate from BID with meals to TID   Mg 2.1 - no replenishment warranted today  Re-check electrolytes with AM labs  Lu Duffel, PharmD, BCPS Clinical Pharmacist 02/16/2021 7:28 AM

## 2021-02-16 NOTE — Progress Notes (Addendum)
PROGRESS NOTE    Scott Woods  FXT:[id number] DOB: 1950/11/08 DOA: 02/11/2021 PCP: [facility]    Brief Narrative:  CELESTE Woods is a 70 y.o. male with medical history significant for BPH with lower urinary obstructive symptoms currently undergoing evaluation by urology, elevated PSA insulin-dependent type 2 diabetes, HTN and stage II CKD, who was sent from his PCPs office for abnormal kidney function.  Patient had been complaining of generalized weakness and weight loss of over 20 pounds for the past 3 months and prostate biopsy was being arranged.  On admission  Found with creatinine of 6.8, foley placed, nephrology and urology consulted. Renal function improving with gentle hydration.    Consultants:   cardiology  Procedures:   Antimicrobials:      Subjective: No chest pain, abdominal pain, shortness of breath  Objective: Vitals:   02/15/21 1749 02/15/21 2119 02/15/21 2325 02/16/21 0415  BP: 120/70 (!) 141/73 125/68 117/75  Pulse: 88 97 93 92  Resp: 16 18 18 16   Temp: 98 F (36.7 C) 97.7 F (36.5 C) 98.2 F (36.8 C) 98.7 F (37.1 C)  TempSrc: Oral Oral Oral Oral  SpO2: 100% 94% 90% 97%  Weight:      Height:        Intake/Output Summary (Last 24 hours) at 02/16/2021 0801 Last data filed at 02/16/2021 0425 Gross per 24 hour  Intake 12704.71 ml  Output 1850 ml  Net 10854.71 ml   Filed Weights   02/11/21 1630  Weight: 68 kg    Examination: Calm, NAD CTA no wheeze rales rhonchi's Regular S1-S2 no gallops Soft benign positive bowel sounds No edema Awake and alert grossly intact Mood and affect appropriate in current setting    Data Reviewed: I have personally reviewed following labs and imaging studies  CBC: Recent Labs  Lab 02/11/21 1057 02/12/21 0428 02/15/21 0911  WBC 9.9 5.7 5.2  NEUTROABS 8.3*  --   --   HGB 10.9* 9.2* 8.5*  HCT 32.0* 26.8* 24.7*  MCV 88.6 87.3 88.5  PLT 241 195 299   Basic Metabolic  Panel: Recent Labs  Lab 02/11/21 2104 02/12/21 0428 02/13/21 0536 02/14/21 0642 02/15/21 0911 02/16/21 0604  NA 134* 136 139 140 138 137  K 4.9 4.2 3.8 3.8 3.4* 3.8  CL 104 106 102 98 91* 89*  CO2 15* 16* 27 29 30  33*  GLUCOSE 138* 122* 93 85 110* 143*  BUN 108* 100* 82* 61* 49* 39*  CREATININE 6.80* 5.72* 4.50* 3.98* 3.40* 3.02*  CALCIUM 9.1 8.3* 7.4* 6.8* 6.3* 7.0*  MG 1.9  --   --   --  1.3* 2.1  PHOS  --   --   --   --   --  4.5   GFR: Estimated Creatinine Clearance: 22.5 mL/min (A) (by C-G formula based on SCr of 3.02 mg/dL (H)). Liver Function Tests: Recent Labs  Lab 02/11/21 1057  AST 13*  ALT 10  ALKPHOS 62  BILITOT 1.2  PROT 7.7  ALBUMIN 3.5   No results for input(s): LIPASE, AMYLASE in the last 168 hours. No results for input(s): AMMONIA in the last 168 hours. Coagulation Profile: No results for input(s): INR, PROTIME in the last 168 hours. Cardiac Enzymes: No results for input(s): CKTOTAL, CKMB, CKMBINDEX, TROPONINI in the last 168 hours. BNP (last 3 results) No results for input(s): PROBNP in the last 8760 hours. HbA1C: No results for input(s): HGBA1C in the last 72 hours. CBG: Recent Labs  Lab 02/14/21 2118 02/15/21 0758 02/15/21 1142 02/15/21 1700 02/15/21 2120  GLUCAP 86 88 103* 128* 143*   Lipid Profile: No results for input(s): CHOL, HDL, LDLCALC, TRIG, CHOLHDL, LDLDIRECT in the last 72 hours. Thyroid Function Tests: No results for input(s): TSH, T4TOTAL, FREET4, T3FREE, THYROIDAB in the last 72 hours. Anemia Panel: No results for input(s): VITAMINB12, FOLATE, FERRITIN, TIBC, IRON, RETICCTPCT in the last 72 hours. Sepsis Labs: Recent Labs  Lab 02/12/21 0428  PROCALCITON 0.30    Recent Results (from the past 240 hour(s))  Urine culture     Status: None   Collection Time: 02/11/21 10:41 PM   Specimen: Urine, Random  Result Value Ref Range Status   Specimen Description   Final    URINE, RANDOM Performed at [facility],  [address], [place], [place] [address]    Special Requests   Final    NONE Performed at [facility], [address] [place], [place] [address]    Culture   Final    NO GROWTH Performed at [facility], [place] [address], [place], [place] [address]    Report Status 02/13/2021 FINAL  Final  Resp Panel by RT-PCR (Flu A&B, Covid) Urine, Catheterized     Status: Abnormal   Collection Time: 02/11/21 10:41 PM   Specimen: Urine, Catheterized; Nasopharyngeal(NP) swabs in vial transport medium  Result Value Ref Range Status   SARS Coronavirus 2 by RT PCR POSITIVE (A) NEGATIVE Final    Comment: RESULT CALLED TO, READ BACK BY AND VERIFIED WITH: Leonides Schanz RN 0006 02/12/21 HNM (NOTE) SARS-CoV-2 target nucleic acids are DETECTED.  The SARS-CoV-2 RNA is generally detectable in upper respiratory specimens during the acute phase of infection. Positive results are indicative of the presence of the identified virus, but do not rule out bacterial infection or co-infection with other pathogens not detected by the test. Clinical correlation with patient history and other diagnostic information is necessary to determine patient infection status. The expected result is Negative.  Fact Sheet for Patients: EntrepreneurPulse.com.au  Fact Sheet for Healthcare Providers: IncredibleEmployment.be  This test is not yet approved or cleared by the [place] FDA and  has been authorized for detection and/or diagnosis of SARS-CoV-2 by FDA under an Emergency Use Authorization (EUA).  This EUA will remain in effect (meaning this test can be  used) for the duration of  the COVID-19 declaration under Section 564(b)(1) of the Act, 21 U.S.C. section 360bbb-3(b)(1), unless the authorization is terminated or revoked sooner.     Influenza A by PCR NEGATIVE NEGATIVE Final   Influenza B by PCR NEGATIVE NEGATIVE Final    Comment: (NOTE) The Xpert Xpress  SARS-CoV-2/FLU/RSV plus assay is intended as an aid in the diagnosis of influenza from Nasopharyngeal swab specimens and should not be used as a sole basis for treatment. Nasal washings and aspirates are unacceptable for Xpert Xpress SARS-CoV-2/FLU/RSV testing.  Fact Sheet for Patients: EntrepreneurPulse.com.au  Fact Sheet for Healthcare Providers: IncredibleEmployment.be  This test is not yet approved or cleared by the [place] FDA and has been authorized for detection and/or diagnosis of SARS-CoV-2 by FDA under an Emergency Use Authorization (EUA). This EUA will remain in effect (meaning this test can be used) for the duration of the COVID-19 declaration under Section 564(b)(1) of the Act, 21 U.S.C. section 360bbb-3(b)(1), unless the authorization is terminated or revoked.  Performed at [facility], [address], [place], [place] [address]  Radiology Studies: No results found.      Scheduled Meds: . calcium carbonate  500 mg of elemental calcium Oral BID WC  . Chlorhexidine Gluconate Cloth  6 each Topical Daily  . heparin  5,000 Units Subcutaneous Q8H  . insulin aspart  0-5 Units Subcutaneous QHS  . insulin aspart  0-9 Units Subcutaneous TID WC  . midodrine  5 mg Oral TID WC  . tamsulosin  0.4 mg Oral QPC supper   Continuous Infusions: . sodium chloride 75 mL/hr at 02/16/21 0623  .  sodium bicarbonate (isotonic) infusion in sterile water 125 mL/hr at 02/16/21 0421    Assessment & Plan:   Principal Problem:   Acute kidney injury superimposed on CKD II ([facility]) Active Problems:   Hypertension, goal below 140/90   Diabetes mellitus type 2, controlled, without complications (HCC)   BPH with obstruction/lower urinary tract symptoms   Generalized weakness   Increased anion gap metabolic acidosis   Abnormal chest CT   70 year old male with history of BPH with lower urinary obstructive symptoms  currently undergoing evaluation by urology, elevated PSA insulin-dependent type 2 diabetes, HTN and stage II CKD, who was sent from his PCPs office for weakness and abnormal kidney function.     Acute kidney injury superimposed on CKD II (HCC)   Increased anion gap metabolic acidosis   BPH with obstruction/lower urinary tract symptoms - Suspect combination of post renal, as well as prerenal given recent history of weight loss and poor oral intake/prerenal and post renal issues. -CT renal stone study with prostatomegaly and bladder diverticuli, possible underlying component of bladder outlet obstruction   Foley in place  Renal ultrasound no acute renal abnormality  no immediate need for dialysis  Avoid nephrotoxic medications, avoid hypotension, avoid IV contrast Urologist following Continue tamsulosin Continue Foley catheter for 7 to 10 days with plans for outpatient voiding trial (scheduled) Rescheduling upcoming prostate biopsy given indwelling Foley catheter for 1 week following biopsy (scheduled) 5/31 continues to improve, today 3.0 to  Nephrology following  Continue IV fluids       Hypokalemia-mild Replaced and stable   Anemia -possibly secondary to  underlying CKD Hemoglobin slowly down, possibly part of it is dilutional Stool occult  Pending No signs or symptoms of bleeding    Generalized weakness/weight loss Uncertain primary etiology. covid infection/dehydration/AKI likely attributing to weakness. cxr without acute dz 5/30 continue IV fluids  Encourage p.o. intake  PT OT recommend SNF    Hypocalcemia- Calcium 6.3 Pharmacy managing    Hypertension- bp nml-low Hold bp meds Continue midodrine    Diabetes mellitus type 2, controlled, without complications (HCC) BG stable Continue R-ISS    Abnormal CT/Covid 19 infection Patchy and nodular airspace disease right lower lobe. Imaging features are compatible with pneumonia, possibly atypical  etiology. Follow-up CT chest without contrast in 3 months could be used to ensure resolution On room air  DVT prophylaxis: heparin Code Status:full Family Communication: none at bedside  Status is: Inpatient  Remains inpatient appropriate because:Hemodynamically unstable and Inpatient level of care appropriate due to severity of illness   Dispo: The patient is from: Home              Anticipated d/c is to: home with [facility]              Patient currently is not medically stable to d/c.   Difficult to place patient No    Anticipation: needs ivf, creatinine monitoring.  LOS: 5 days   Time spent: 45 min with >50% on coc    Nolberto Hanlon, MD [facility] Pager [phone]  If 7PM-7AM, please contact night-coverage 02/16/2021, 8:01 AM

## 2021-02-16 NOTE — Plan of Care (Signed)
  Problem: Clinical Measurements: Goal: Ability to maintain clinical measurements within normal limits will improve Outcome: Progressing Goal: Will remain free from infection Outcome: Progressing Goal: Diagnostic test results will improve Outcome: Progressing Goal: Respiratory complications will improve Outcome: Progressing Goal: Cardiovascular complication will be avoided Outcome: Progressing   Problem: Pain Managment: Goal: General experience of comfort will improve Outcome: Progressing   Problem: Safety: Goal: Ability to remain free from injury will improve Outcome: Progressing   Pt is involved in and agrees with the plan of care. V/S stable. On RA with oxygen saturation at 97%. Pt was nauseas and dry-heaving, zofran given 2x this shift. FC in place draining well. Denies ang pain.

## 2021-02-16 NOTE — Progress Notes (Signed)
Physical Therapy Treatment Patient Details Name: Scott Woods MRN: [id number] DOB: 23-Aug-1951 Today's Date: 02/16/2021    History of Present Illness Pt is a 70 y.o. male with medical history significant for BPH with lower urinary obstructive symptoms currently undergoing evaluation by urology, elevated PSA insulin-dependent type 2 diabetes, HTN and stage II CKD, who was sent from his PCPs office for abnormal kidney function.  MD assessment includes: Acute kidney injury superimposed on CKD II, generalized weakness, HTN, and Covid 19 infection.    PT Comments    Patient received in bed, reports he is feeling fine. Agrees to PT session. Patient is mod independent with supine to sit. Upon mobility patient having gagging and dry heaving pretty continuously. Unable to continue with mobility and returns himself to supine. Once supine, symptoms stop. RN notified. Patient will continue to benefit from skilled PT while here to improve functional independence and strength for return home safely.      Follow Up Recommendations  Home health PT;Supervision for mobility/OOB     Equipment Recommendations  Rolling walker with 5" wheels    Recommendations for Other Services       Precautions / Restrictions Precautions Precautions: Fall Restrictions Weight Bearing Restrictions: No    Mobility  Bed Mobility Overal bed mobility: Modified Independent             General bed mobility comments: Extra time and effort, HOB elevated, use of bed rails. Upone sitting eob, patient begins gagging and dry heaving. He is unable to continue and returns to supine. He never vomited at all. RN notified.    Transfers                 General transfer comment: unable  Ambulation/Gait             General Gait Details: unable   Stairs             Wheelchair Mobility    Modified Rankin (Stroke Patients Only)       Balance Overall balance assessment: Independent Sitting-balance  support: Feet supported Sitting balance-Leahy Scale: Good                                      Cognition Arousal/Alertness: Awake/alert Behavior During Therapy: WFL for tasks assessed/performed Overall Cognitive Status: Within Functional Limits for tasks assessed                                 General Comments: seems to have decreased awareness of situation/condition      Exercises      General Comments        Pertinent Vitals/Pain Pain Assessment: No/denies pain    Home Living                      Prior Function            PT Goals (current goals can now be found in the care plan section) Acute Rehab PT Goals Patient Stated Goal: To return home PT Goal Formulation: With patient Time For Goal Achievement: 02/27/21 Potential to Achieve Goals: Fair Progress towards PT goals: Not progressing toward goals - comment (limited by gagging and nausea this session)    Frequency    Min 2X/week      PT Plan Discharge plan needs to be updated  Co-evaluation              AM-PAC PT "6 Clicks" Mobility   Outcome Measure  Help needed turning from your back to your side while in a flat bed without using bedrails?: None Help needed moving from lying on your back to sitting on the side of a flat bed without using bedrails?: A Little Help needed moving to and from a bed to a chair (including a wheelchair)?: A Little Help needed standing up from a chair using your arms (e.g., wheelchair or bedside chair)?: A Little Help needed to walk in hospital room?: A Lot Help needed climbing 3-5 steps with a railing? : A Lot 6 Click Score: 17    End of Session   Activity Tolerance: Other (comment) (limited by dry heaving and gagging during session.)   Nurse Communication: Other (comment) (dry heaving and gagging.) PT Visit Diagnosis: Difficulty in walking, not elsewhere classified (R26.2);Muscle weakness (generalized) (M62.81)      Time: 8295-6213 PT Time Calculation (min) (ACUTE ONLY): 16 min  Charges:  $Therapeutic Activity: 8-22 mins                     Rourke Mcquitty, PT, GCS 02/16/21,10:31 AM

## 2021-02-17 DIAGNOSIS — N138 Other obstructive and reflux uropathy: Secondary | ICD-10-CM

## 2021-02-17 DIAGNOSIS — N179 Acute kidney failure, unspecified: Secondary | ICD-10-CM | POA: Diagnosis not present

## 2021-02-17 DIAGNOSIS — R339 Retention of urine, unspecified: Secondary | ICD-10-CM

## 2021-02-17 DIAGNOSIS — N401 Enlarged prostate with lower urinary tract symptoms: Principal | ICD-10-CM

## 2021-02-17 DIAGNOSIS — E872 Acidosis: Secondary | ICD-10-CM | POA: Diagnosis not present

## 2021-02-17 DIAGNOSIS — E119 Type 2 diabetes mellitus without complications: Secondary | ICD-10-CM | POA: Diagnosis not present

## 2021-02-17 LAB — BASIC METABOLIC PANEL
Anion gap: 9 (ref 5–15)
BUN: 31 mg/dL — ABNORMAL HIGH (ref 8–23)
CO2: 34 mmol/L — ABNORMAL HIGH (ref 22–32)
Calcium: 7.3 mg/dL — ABNORMAL LOW (ref 8.9–10.3)
Chloride: 95 mmol/L — ABNORMAL LOW (ref 98–111)
Creatinine, Ser: 2.9 mg/dL — ABNORMAL HIGH (ref 0.61–1.24)
GFR, Estimated: 23 mL/min — ABNORMAL LOW (ref >60)
Glucose, Bld: 99 mg/dL (ref 70–99)
Potassium: 3.4 mmol/L — ABNORMAL LOW (ref 3.5–5.1)
Sodium: 138 mmol/L (ref 135–145)

## 2021-02-17 LAB — GLUCOSE, CAPILLARY
Glucose-Capillary: 101 mg/dL — ABNORMAL HIGH (ref 70–99)
Glucose-Capillary: 117 mg/dL — ABNORMAL HIGH (ref 70–99)
Glucose-Capillary: 120 mg/dL — ABNORMAL HIGH (ref 70–99)
Glucose-Capillary: 146 mg/dL — ABNORMAL HIGH (ref 70–99)

## 2021-02-17 LAB — CALCIUM, IONIZED: Calcium, Ionized, Serum: 3.7 mg/dL — ABNORMAL LOW (ref 4.5–5.6)

## 2021-02-17 LAB — MAGNESIUM: Magnesium: 1.9 mg/dL (ref 1.7–2.4)

## 2021-02-17 MED ORDER — POTASSIUM CHLORIDE CRYS ER 20 MEQ PO TBCR
20.0000 meq | EXTENDED_RELEASE_TABLET | Freq: Once | ORAL | Status: AC
  Administered 2021-02-17: 20 meq via ORAL
  Filled 2021-02-17: qty 1

## 2021-02-17 NOTE — Progress Notes (Signed)
[place]   Recent Labs: Potassium (mmol/L)  Date Value  02/17/2021 3.4 (L)   Magnesium (mg/dL)  Date Value  02/17/2021 1.9   Calcium (mg/dL)  Date Value  02/17/2021 7.3 (L)   Albumin (g/dL)  Date Value  02/11/2021 3.5   Phosphorus (mg/dL)  Date Value  02/16/2021 4.5   Sodium (mmol/L)  Date Value  02/17/2021 138   Corrected Calcium 7.7 mg/dL  Assessment: 69 y.o.malewith medical history significant for BPH with lower urinary obstructive symptoms currently undergoing evaluation by urology, elevated PSA insulin-dependent type 2 diabetes, HTN and stage II CKD, who was sent from his PCPs office for abnormal kidney function. Pharmacy has been consulted for electrolyte management.  Goal of Therapy:  Electrolytes WNL  Plan:   20 mEq oral KCl x 1  Re-check electrolytes with AM labs  Dallie Piles, PharmD, BCPS Clinical Pharmacist 02/17/2021 7:22 AM

## 2021-02-17 NOTE — Progress Notes (Addendum)
[facility]  PROGRESS NOTE   Subjective:   Patient seen laying in bed Alert and oriented Continues to have poor appetite States he drinks fluids well Denies nausea and diarrhea  Objective:  Vital signs in last 24 hours:  Temp:  [97.5 F (36.4 C)-98.5 F (36.9 C)] 98.1 F (36.7 C) (05/30 1152) Pulse Rate:  [76-93] 84 (05/30 1152) Resp:  [16-20] 17 (05/30 1152) BP: (102-139)/(52-69) 139/69 (05/30 1152) SpO2:  [93 %-98 %] 98 % (05/30 1152)  Weight change:  Filed Weights   02/11/21 1630  Weight: 68 kg    Intake/Output: I/O last 3 completed shifts: In: -  Out: 2275 [Urine:2275]   Intake/Output this shift:  Total I/O In: -  Out: 600 [Urine:600]  Physical Exam: General:  No acute distress  Head:  Normocephalic, atraumatic. Moist oral mucosal membranes  Eyes:  Anicteric  Lungs:   Clear to auscultation, normal effort  Heart:  S1S2 no rubs  Abdomen:   Soft, nontender, bowel sounds present  Extremities:  trace peripheral edema.  Neurologic:  Awake, alert, following commands  Skin:  No lesions  GU Foley    Basic Metabolic Panel: Recent Labs  Lab 02/11/21 2104 02/12/21 0428 02/13/21 0536 02/14/21 0642 02/15/21 0911  NA 134* 136 139 140 138  K 4.9 4.2 3.8 3.8 3.4*  CL 104 106 102 98 91*  CO2 15* 16* 27 29 30   GLUCOSE 138* 122* 93 85 110*  BUN 108* 100* 82* 61* 49*  CREATININE 6.80* 5.72* 4.50* 3.98* 3.40*  CALCIUM 9.1 8.3* 7.4* 6.8* 6.3*  MG 1.9  --   --   --  1.3*    Liver Function Tests: Recent Labs  Lab 02/11/21 1057  AST 13*  ALT 10  ALKPHOS 62  BILITOT 1.2  PROT 7.7  ALBUMIN 3.5   No results for input(s): LIPASE, AMYLASE in the last 168 hours. No results for input(s): AMMONIA in the last 168 hours.  CBC: Recent Labs  Lab 02/11/21 1057 02/12/21 0428 02/15/21 0911  WBC 9.9 5.7 5.2  NEUTROABS 8.3*  --   --   HGB 10.9* 9.2* 8.5*  HCT 32.0* 26.8* 24.7*  MCV 88.6 87.3 88.5  PLT 241 195 154    Cardiac Enzymes: No results  for input(s): CKTOTAL, CKMB, CKMBINDEX, TROPONINI in the last 168 hours.  BNP: Invalid input(s): POCBNP  CBG: Recent Labs  Lab 02/14/21 1241 02/14/21 1755 02/14/21 2118 02/15/21 0758 02/15/21 1142  GLUCAP 75 90 86 88 103*    Microbiology: Results for orders placed or performed during the hospital encounter of 02/11/21  Urine culture     Status: None   Collection Time: 02/11/21 10:41 PM   Specimen: Urine, Random  Result Value Ref Range Status   Specimen Description   Final    URINE, RANDOM Performed at [facility], [address] [place], [place] [address]    Special Requests   Final    NONE Performed at [facility], [address], [place], [place] [address]    Culture   Final    NO GROWTH Performed at [facility], [place] [address], [place], [place] [address]    Report Status 02/13/2021 FINAL  Final  Resp Panel by RT-PCR (Flu A&B, Covid) Urine, Catheterized     Status: Abnormal   Collection Time: 02/11/21 10:41 PM   Specimen: Urine, Catheterized; Nasopharyngeal(NP) swabs in vial transport medium  Result Value Ref Range Status   SARS Coronavirus 2 by  RT PCR POSITIVE (A) NEGATIVE Final    Comment: RESULT CALLED TO, READ BACK BY AND VERIFIED WITH: Leonides Schanz RN 0006 02/12/21 HNM (NOTE) SARS-CoV-2 target nucleic acids are DETECTED.  The SARS-CoV-2 RNA is generally detectable in upper respiratory specimens during the acute phase of infection. Positive results are indicative of the presence of the identified virus, but do not rule out bacterial infection or co-infection with other pathogens not detected by the test. Clinical correlation with patient history and other diagnostic information is necessary to determine patient infection status. The expected result is Negative.  Fact Sheet for Patients: EntrepreneurPulse.com.au  Fact Sheet for Healthcare Providers: IncredibleEmployment.be  This test is  not yet approved or cleared by the [place] FDA and  has been authorized for detection and/or diagnosis of SARS-CoV-2 by FDA under an Emergency Use Authorization (EUA).  This EUA will remain in effect (meaning this test can be  used) for the duration of  the COVID-19 declaration under Section 564(b)(1) of the Act, 21 U.S.C. section 360bbb-3(b)(1), unless the authorization is terminated or revoked sooner.     Influenza A by PCR NEGATIVE NEGATIVE Final   Influenza B by PCR NEGATIVE NEGATIVE Final    Comment: (NOTE) The Xpert Xpress SARS-CoV-2/FLU/RSV plus assay is intended as an aid in the diagnosis of influenza from Nasopharyngeal swab specimens and should not be used as a sole basis for treatment. Nasal washings and aspirates are unacceptable for Xpert Xpress SARS-CoV-2/FLU/RSV testing.  Fact Sheet for Patients: EntrepreneurPulse.com.au  Fact Sheet for Healthcare Providers: IncredibleEmployment.be  This test is not yet approved or cleared by the [place] FDA and has been authorized for detection and/or diagnosis of SARS-CoV-2 by FDA under an Emergency Use Authorization (EUA). This EUA will remain in effect (meaning this test can be used) for the duration of the COVID-19 declaration under Section 564(b)(1) of the Act, 21 U.S.C. section 360bbb-3(b)(1), unless the authorization is terminated or revoked.  Performed at [facility], [address] [place], [place] [address]     Coagulation Studies: No results for input(s): LABPROT, INR in the last 72 hours.  Urinalysis: No results for input(s): COLORURINE, LABSPEC, PHURINE, GLUCOSEU, HGBUR, BILIRUBINUR, KETONESUR, PROTEINUR, UROBILINOGEN, NITRITE, LEUKOCYTESUR in the last 72 hours.  Invalid input(s): APPERANCEUR    Imaging: No results found.   Medications:   . sodium chloride 75 mL/hr at 02/14/21 2336  . calcium gluconate    . magnesium sulfate bolus IVPB    .   sodium bicarbonate (isotonic) infusion in sterile water 125 mL/hr at 02/15/21 0608   . calcium carbonate  500 mg of elemental calcium Oral BID WC  . Chlorhexidine Gluconate Cloth  6 each Topical Daily  . heparin  5,000 Units Subcutaneous Q8H  . insulin aspart  0-5 Units Subcutaneous QHS  . insulin aspart  0-9 Units Subcutaneous TID WC  . midodrine  5 mg Oral TID WC  . tamsulosin  0.4 mg Oral QPC supper    Assessment/ Plan:     Principal Problem:   Acute kidney injury superimposed on CKD II (HCC) Active Problems:   Hypertension, goal below 140/90   Diabetes mellitus type 2, controlled, without complications (HCC)   BPH with obstruction/lower urinary tract symptoms   Generalized weakness   Increased anion gap metabolic acidosis   Abnormal chest CT  70 year old male with history of hypertension, coronary artery disease, diabetes, peripheral vascular disease, BPH, CKD now admitted with history of weight loss, cough, shortness of breath and found  to have significant metabolic acidosis, hypotension and acute kidney injury on top of chronic kidney disease.  #1 AKI: Most likely due to prerenal azotemia complicated by post renal issues. Renal sonogram is negative. Patient had a Foley catheter placed. Renal function slowly improving D/C NS IVF  Foley to remain ni place for 7-10 days, then voiding trial to be arranged by Urology.  Will avoid nephrotoxic medications including IV contrast.  #2 anemia, possibly secondary to CKD Hgb 8.5 Will continue to monitor  #3 hypocalcemia, possibly due to secondary hyperparathyroidism.   Calcium improved to 7.3 Continue calcium carbonate 1200 mg BID.  #4 COVID infection, stable at this time. Room air  #5 Hypokalemia supplement Potassium as ordered Will prescribe K 42mEq po daily      LOS: 6 Colon Flattery, MD [facility] 6/1/202212:51 PM

## 2021-02-17 NOTE — Progress Notes (Signed)
PROGRESS NOTE    Scott Woods  ZOX:[id number] DOB: 1951/03/04 DOA: 02/11/2021 PCP: [facility]    Brief Narrative:  Scott Woods is a 71 y.o. male with medical history significant for BPH with lower urinary obstructive symptoms currently undergoing evaluation by urology, elevated PSA insulin-dependent type 2 diabetes, HTN and stage II CKD, who was sent from his PCPs office for abnormal kidney function.  Patient had been complaining of generalized weakness and weight loss of over 20 pounds for the past 3 months and prostate biopsy was being arranged.  On admission  Found with creatinine of 6.8, foley placed, nephrology and urology consulted. Renal function improving with gentle hydration.  Creatinine slowly continues to improve.  Hemoglobin also trending downwards although likely initially was falsely higher due to hemoconcentration.  Patient feeling a little bit better.  Mild appetite.  Consultants:   cardiology  Neurology  Nephrology  Procedures:  Foley catheter placement  Antimicrobials:   None    Objective: Vitals:   02/17/21 0552 02/17/21 0803 02/17/21 1159 02/17/21 1655  BP: 126/68 129/75 (!) 151/85 135/74  Pulse: 100 88 86 84  Resp:  16 16 16   Temp: 99.4 F (37.4 C) 98.6 F (37 C) 98.3 F (36.8 C) 97.9 F (36.6 C)  TempSrc: Oral Oral Oral Oral  SpO2: 93% 93% 97% 95%  Weight:      Height:        Intake/Output Summary (Last 24 hours) at 02/17/2021 1732 Last data filed at 02/17/2021 1706 Gross per 24 hour  Intake 2019.75 ml  Output 2600 ml  Net -580.25 ml   Filed Weights   02/11/21 1630  Weight: 68 kg    Examination: General: Alert and oriented x3, no acute distress Lungs: Clear to auscultation bilaterally Cardiac: Regular rate and rhythm, S1-S2 Abdomen: Soft, nontender, nondistended, positive bowel sounds Extremities: No clubbing or cyanosis or edema  Data Reviewed: I have personally reviewed following labs and imaging  studies  CBC: Recent Labs  Lab 02/11/21 1057 02/12/21 0428 02/15/21 0911  WBC 9.9 5.7 5.2  NEUTROABS 8.3*  --   --   HGB 10.9* 9.2* 8.5*  HCT 32.0* 26.8* 24.7*  MCV 88.6 87.3 88.5  PLT 241 195 811   Basic Metabolic Panel: Recent Labs  Lab 02/11/21 2104 02/12/21 0428 02/13/21 0536 02/14/21 0642 02/15/21 0911 02/16/21 0604 02/17/21 0509  NA 134*   < > 139 140 138 137 138  K 4.9   < > 3.8 3.8 3.4* 3.8 3.4*  CL 104   < > 102 98 91* 89* 95*  CO2 15*   < > 27 29 30  33* 34*  GLUCOSE 138*   < > 93 85 110* 143* 99  BUN 108*   < > 82* 61* 49* 39* 31*  CREATININE 6.80*   < > 4.50* 3.98* 3.40* 3.02* 2.90*  CALCIUM 9.1   < > 7.4* 6.8* 6.3* 7.0* 7.3*  MG 1.9  --   --   --  1.3* 2.1 1.9  PHOS  --   --   --   --   --  4.5  --    < > = values in this interval not displayed.   GFR: Estimated Creatinine Clearance: 23.4 mL/min (A) (by C-G formula based on SCr of 2.9 mg/dL (H)). Liver Function Tests: Recent Labs  Lab 02/11/21 1057  AST 13*  ALT 10  ALKPHOS 62  BILITOT 1.2  PROT 7.7  ALBUMIN 3.5   No  results for input(s): LIPASE, AMYLASE in the last 168 hours. No results for input(s): AMMONIA in the last 168 hours. Coagulation Profile: No results for input(s): INR, PROTIME in the last 168 hours. Cardiac Enzymes: No results for input(s): CKTOTAL, CKMB, CKMBINDEX, TROPONINI in the last 168 hours. BNP (last 3 results) No results for input(s): PROBNP in the last 8760 hours. HbA1C: No results for input(s): HGBA1C in the last 72 hours. CBG: Recent Labs  Lab 02/16/21 1621 02/16/21 2144 02/17/21 0800 02/17/21 1155 02/17/21 1652  GLUCAP 166* 113* 101* 117* 120*   Lipid Profile: No results for input(s): CHOL, HDL, LDLCALC, TRIG, CHOLHDL, LDLDIRECT in the last 72 hours. Thyroid Function Tests: No results for input(s): TSH, T4TOTAL, FREET4, T3FREE, THYROIDAB in the last 72 hours. Anemia Panel: No results for input(s): VITAMINB12, FOLATE, FERRITIN, TIBC, IRON, RETICCTPCT in  the last 72 hours. Sepsis Labs: Recent Labs  Lab 02/12/21 0428  PROCALCITON 0.30    Recent Results (from the past 240 hour(s))  Urine culture     Status: None   Collection Time: 02/11/21 10:41 PM   Specimen: Urine, Random  Result Value Ref Range Status   Specimen Description   Final    URINE, RANDOM Performed at [facility], [address] [place], [place] [address]    Special Requests   Final    NONE Performed at [facility], [address] [place], [place] [address]    Culture   Final    NO GROWTH Performed at [facility], [place] [address] [place], [place] [address]    Report Status 02/13/2021 FINAL  Final  Resp Panel by RT-PCR (Flu A&B, Covid) Urine, Catheterized     Status: Abnormal   Collection Time: 02/11/21 10:41 PM   Specimen: Urine, Catheterized; Nasopharyngeal(NP) swabs in vial transport medium  Result Value Ref Range Status   SARS Coronavirus 2 by RT PCR POSITIVE (A) NEGATIVE Final    Comment: RESULT CALLED TO, READ BACK BY AND VERIFIED WITH: Leonides Schanz RN 0006 02/12/21 HNM (NOTE) SARS-CoV-2 target nucleic acids are DETECTED.  The SARS-CoV-2 RNA is generally detectable in upper respiratory specimens during the acute phase of infection. Positive results are indicative of the presence of the identified virus, but do not rule out bacterial infection or co-infection with other pathogens not detected by the test. Clinical correlation with patient history and other diagnostic information is necessary to determine patient infection status. The expected result is Negative.  Fact Sheet for Patients: EntrepreneurPulse.com.au  Fact Sheet for Healthcare Providers: IncredibleEmployment.be  This test is not yet approved or cleared by the [place] FDA and  has been authorized for detection and/or diagnosis of SARS-CoV-2 by FDA under an Emergency Use Authorization (EUA).  This EUA will remain in  effect (meaning this test can be  used) for the duration of  the COVID-19 declaration under Section 564(b)(1) of the Act, 21 U.S.C. section 360bbb-3(b)(1), unless the authorization is terminated or revoked sooner.     Influenza A by PCR NEGATIVE NEGATIVE Final   Influenza B by PCR NEGATIVE NEGATIVE Final    Comment: (NOTE) The Xpert Xpress SARS-CoV-2/FLU/RSV plus assay is intended as an aid in the diagnosis of influenza from Nasopharyngeal swab specimens and should not be used as a sole basis for treatment. Nasal washings and aspirates are unacceptable for Xpert Xpress SARS-CoV-2/FLU/RSV testing.  Fact Sheet for Patients: EntrepreneurPulse.com.au  Fact Sheet for Healthcare Providers: IncredibleEmployment.be  This test is not yet approved or cleared by  the [employer] and has been authorized for detection and/or diagnosis of SARS-CoV-2 by FDA under an Emergency Use Authorization (EUA). This EUA will remain in effect (meaning this test can be used) for the duration of the COVID-19 declaration under Section 564(b)(1) of the Act, 21 U.S.C. section 360bbb-3(b)(1), unless the authorization is terminated or revoked.  Performed at [facility], [address] [place], [place] [address]          Radiology Studies: No results found.      Scheduled Meds: . calcium carbonate  500 mg of elemental calcium Oral TID WC  . Chlorhexidine Gluconate Cloth  6 each Topical Daily  . feeding supplement (GLUCERNA SHAKE)  237 mL Oral TID BM  . heparin  5,000 Units Subcutaneous Q8H  . insulin aspart  0-5 Units Subcutaneous QHS  . insulin aspart  0-9 Units Subcutaneous TID WC  . midodrine  5 mg Oral TID WC  . tamsulosin  0.4 mg Oral QPC supper   Continuous Infusions:   Assessment & Plan:   Principal Problem:   Acute kidney injury superimposed on CKD II ([place]) Active Problems:   Hypertension, goal below 140/90   Diabetes mellitus  type 2, controlled, without complications (HCC)   BPH with obstruction/lower urinary tract symptoms   Generalized weakness   Increased anion gap metabolic acidosis   Abnormal chest CT   Hypocalcemia      Acute kidney injury superimposed on CKD II ([place]) fel this is from uremia t to be secondary to BPH with obstruction and causing secondary metabolic acidosis from uremia: Renal failure felt to be a combination of prerenal intake issues and postrenal obstruction issues.  Foley in place.  No abnormality or renal ultrasound.  Avoiding hypotension and nephrotoxic medications.  Urology is following and patient on Flomax.  Prostate biopsy will be rescheduled.  Creatinine slowly trending downward.  Hypokalemia-mild Replaced and stable   Anemia -possibly secondary to  underlying CKD Hemoglobin slowly down, possibly part of it is dilutional No signs of bleeding.  Recheck labs in the morning.    Generalized weakness/weight loss Uncertain primary etiology. covid infection/dehydration/AKI likely attributing to weakness.  PT and OT recommending skilled nursing  Hypocalcemia- Calcium 6.3 Pharmacy managing  Hypertension- bp nml-low Hold bp meds Continue midodrine    Diabetes mellitus type 2, controlled, without complications (HCC) CBG stable, on sliding scale.    Abnormal CT/Covid 19 infection Patchy and nodular airspace disease right lower lobe. Imaging features are compatible with pneumonia, possibly atypical etiology. Follow-up CT chest without contrast in 3 months could be used to ensure resolution On room air  DVT prophylaxis: heparin Code Status:full Family Communication: Left message for family  Status is: Inpatient  Remains inpatient appropriate because:Hemodynamically unstable and Inpatient level of care appropriate due to severity of illness   Dispo: The patient is from: Home              Anticipated d/c is to: home with [facility]              Patient currently is not  medically stable to d/c.   Difficult to place patient No     LOS: 6 days     Annita Brod, MD [facility] Pager [phone]  If 7PM-7AM, please contact night-coverage 02/17/2021, 5:32 PM

## 2021-02-18 ENCOUNTER — Other Ambulatory Visit: Payer: Self-pay | Admitting: Urology

## 2021-02-18 DIAGNOSIS — N179 Acute kidney failure, unspecified: Secondary | ICD-10-CM | POA: Diagnosis not present

## 2021-02-18 DIAGNOSIS — E119 Type 2 diabetes mellitus without complications: Secondary | ICD-10-CM | POA: Diagnosis not present

## 2021-02-18 DIAGNOSIS — N401 Enlarged prostate with lower urinary tract symptoms: Secondary | ICD-10-CM | POA: Diagnosis not present

## 2021-02-18 DIAGNOSIS — E872 Acidosis: Secondary | ICD-10-CM | POA: Diagnosis not present

## 2021-02-18 LAB — CBC
HCT: 26.9 % — ABNORMAL LOW (ref 39.0–52.0)
Hemoglobin: 9.2 g/dL — ABNORMAL LOW (ref 13.0–17.0)
MCH: 30.9 pg (ref 26.0–34.0)
MCHC: 34.2 g/dL (ref 30.0–36.0)
MCV: 90.3 fL (ref 80.0–100.0)
Platelets: 139 10*3/uL — ABNORMAL LOW (ref 150–400)
RBC: 2.98 MIL/uL — ABNORMAL LOW (ref 4.22–5.81)
RDW: 12.1 % (ref 11.5–15.5)
WBC: 4.7 10*3/uL (ref 4.0–10.5)
nRBC: 0 % (ref 0.0–0.2)

## 2021-02-18 LAB — BASIC METABOLIC PANEL
Anion gap: 9 (ref 5–15)
BUN: 27 mg/dL — ABNORMAL HIGH (ref 8–23)
CO2: 32 mmol/L (ref 22–32)
Calcium: 7.4 mg/dL — ABNORMAL LOW (ref 8.9–10.3)
Chloride: 97 mmol/L — ABNORMAL LOW (ref 98–111)
Creatinine, Ser: 2.75 mg/dL — ABNORMAL HIGH (ref 0.61–1.24)
GFR, Estimated: 24 mL/min — ABNORMAL LOW (ref >60)
Glucose, Bld: 104 mg/dL — ABNORMAL HIGH (ref 70–99)
Potassium: 3.7 mmol/L (ref 3.5–5.1)
Sodium: 138 mmol/L (ref 135–145)

## 2021-02-18 LAB — GLUCOSE, CAPILLARY
Glucose-Capillary: 98 mg/dL (ref 70–99)
Glucose-Capillary: 169 mg/dL — ABNORMAL HIGH (ref 70–99)
Glucose-Capillary: 120 mg/dL — ABNORMAL HIGH (ref 70–99)
Glucose-Capillary: 140 mg/dL — ABNORMAL HIGH (ref 70–99)

## 2021-02-18 MED ORDER — SODIUM CHLORIDE 0.9 % IV SOLN: INTRAVENOUS | Status: DC

## 2021-02-18 NOTE — Progress Notes (Signed)
PROGRESS NOTE    Scott Woods  OAC:[id number] DOB: 1950/10/18 DOA: 02/11/2021 PCP: [facility] [place]    Brief Narrative:  Scott Woods is a 70 y.o. male with medical history significant for BPH with lower urinary obstructive symptoms currently undergoing evaluation by urology, elevated PSA insulin-dependent type 2 diabetes, HTN and stage II CKD, who was sent from his PCPs office for abnormal kidney function.  Patient had been complaining of generalized weakness and weight loss of over 20 pounds for the past 3 months and prostate biopsy was being arranged.  On admission  Found with creatinine of 6.8, foley placed, nephrology and urology consulted. Renal function improving with gentle hydration.  Creatinine slowly continues to improve.  Hemoglobin initially trending downwards although likely initially was falsely higher due to hemoconcentration.  Patient continues to feel better.  Appetite slowly improving.  Creatinine down to 2.8 today.  Consultants:   cardiology  Neurology  Nephrology  Procedures:  Foley catheter placement  Antimicrobials:   None    Objective: Vitals:   02/18/21 0020 02/18/21 0447 02/18/21 0818 02/18/21 1156  BP: (!) 138/94 124/62 (!) 128/97 (!) 143/96  Pulse: 92 84 94 (!) 108  Resp: 18 16 17 16   Temp: 98 F (36.7 C) 98.2 F (36.8 C) 98 F (36.7 C) 98.5 F (36.9 C)  TempSrc: Oral  Oral Oral  SpO2: 92% 97% 96% 99%  Weight:      Height:        Intake/Output Summary (Last 24 hours) at 02/18/2021 1407 Last data filed at 02/18/2021 0900 Gross per 24 hour  Intake 240 ml  Output 1900 ml  Net -1660 ml   Filed Weights   02/11/21 1630  Weight: 68 kg    Examination: General: Alert and oriented x3, no acute distress Lungs: Clear to auscultation bilaterally Cardiac: Regular rate and rhythm, S1-S2 Abdomen: Soft, nontender, nondistended, positive bowel sounds Extremities: No clubbing or cyanosis or edema  Data Reviewed: I have  personally reviewed following labs and imaging studies  CBC: Recent Labs  Lab 02/12/21 0428 02/15/21 0911 02/18/21 0441  WBC 5.7 5.2 4.7  HGB 9.2* 8.5* 9.2*  HCT 26.8* 24.7* 26.9*  MCV 87.3 88.5 90.3  PLT 195 154 010*   Basic Metabolic Panel: Recent Labs  Lab 02/11/21 2104 02/12/21 0428 02/14/21 0642 02/15/21 0911 02/16/21 0604 02/17/21 0509 02/18/21 0441  NA 134*   < > 140 138 137 138 138  K 4.9   < > 3.8 3.4* 3.8 3.4* 3.7  CL 104   < > 98 91* 89* 95* 97*  CO2 15*   < > 29 30 33* 34* 32  GLUCOSE 138*   < > 85 110* 143* 99 104*  BUN 108*   < > 61* 49* 39* 31* 27*  CREATININE 6.80*   < > 3.98* 3.40* 3.02* 2.90* 2.75*  CALCIUM 9.1   < > 6.8* 6.3* 7.0* 7.3* 7.4*  MG 1.9  --   --  1.3* 2.1 1.9  --   PHOS  --   --   --   --  4.5  --   --    < > = values in this interval not displayed.   GFR: Estimated Creatinine Clearance: 24.7 mL/min (A) (by C-G formula based on SCr of 2.75 mg/dL (H)). Liver Function Tests: No results for input(s): AST, ALT, ALKPHOS, BILITOT, PROT, ALBUMIN in the last 168 hours. No results for input(s): LIPASE, AMYLASE in the last 168 hours. No  results for input(s): AMMONIA in the last 168 hours. Coagulation Profile: No results for input(s): INR, PROTIME in the last 168 hours. Cardiac Enzymes: No results for input(s): CKTOTAL, CKMB, CKMBINDEX, TROPONINI in the last 168 hours. BNP (last 3 results) No results for input(s): PROBNP in the last 8760 hours. HbA1C: No results for input(s): HGBA1C in the last 72 hours. CBG: Recent Labs  Lab 02/17/21 1155 02/17/21 1652 02/17/21 2053 02/18/21 0818 02/18/21 1156  GLUCAP 117* 120* 146* 98 169*   Lipid Profile: No results for input(s): CHOL, HDL, LDLCALC, TRIG, CHOLHDL, LDLDIRECT in the last 72 hours. Thyroid Function Tests: No results for input(s): TSH, T4TOTAL, FREET4, T3FREE, THYROIDAB in the last 72 hours. Anemia Panel: No results for input(s): VITAMINB12, FOLATE, FERRITIN, TIBC, IRON, RETICCTPCT  in the last 72 hours. Sepsis Labs: Recent Labs  Lab 02/12/21 0428  PROCALCITON 0.30    Recent Results (from the past 240 hour(s))  Urine culture     Status: None   Collection Time: 02/11/21 10:41 PM   Specimen: Urine, Random  Result Value Ref Range Status   Specimen Description   Final    URINE, RANDOM Performed at [facility], [address], [place], [place] [address]    Special Requests   Final    NONE Performed at [facility], [address] [place], [place] [address]    Culture   Final    NO GROWTH Performed at [facility], [place] [address], [place], [place] [address]    Report Status 02/13/2021 FINAL  Final  Resp Panel by RT-PCR (Flu A&B, Covid) Urine, Catheterized     Status: Abnormal   Collection Time: 02/11/21 10:41 PM   Specimen: Urine, Catheterized; Nasopharyngeal(NP) swabs in vial transport medium  Result Value Ref Range Status   SARS Coronavirus 2 by RT PCR POSITIVE (A) NEGATIVE Final    Comment: RESULT CALLED TO, READ BACK BY AND VERIFIED WITH: Leonides Schanz RN 0006 02/12/21 HNM (NOTE) SARS-CoV-2 target nucleic acids are DETECTED.  The SARS-CoV-2 RNA is generally detectable in upper respiratory specimens during the acute phase of infection. Positive results are indicative of the presence of the identified virus, but do not rule out bacterial infection or co-infection with other pathogens not detected by the test. Clinical correlation with patient history and other diagnostic information is necessary to determine patient infection status. The expected result is Negative.  Fact Sheet for Patients: EntrepreneurPulse.com.au  Fact Sheet for Healthcare Providers: IncredibleEmployment.be  This test is not yet approved or cleared by the [place] FDA and  has been authorized for detection and/or diagnosis of SARS-CoV-2 by FDA under an Emergency Use Authorization (EUA).  This EUA will remain in  effect (meaning this test can be  used) for the duration of  the COVID-19 declaration under Section 564(b)(1) of the Act, 21 U.S.C. section 360bbb-3(b)(1), unless the authorization is terminated or revoked sooner.     Influenza A by PCR NEGATIVE NEGATIVE Final   Influenza B by PCR NEGATIVE NEGATIVE Final    Comment: (NOTE) The Xpert Xpress SARS-CoV-2/FLU/RSV plus assay is intended as an aid in the diagnosis of influenza from Nasopharyngeal swab specimens and should not be used as a sole basis for treatment. Nasal washings and aspirates are unacceptable for Xpert Xpress SARS-CoV-2/FLU/RSV testing.  Fact Sheet for Patients: EntrepreneurPulse.com.au  Fact Sheet for Healthcare Providers: IncredibleEmployment.be  This test is not yet approved or cleared by the [place] FDA and has been authorized for detection and/or  diagnosis of SARS-CoV-2 by FDA under an Emergency Use Authorization (EUA). This EUA will remain in effect (meaning this test can be used) for the duration of the COVID-19 declaration under Section 564(b)(1) of the Act, 21 U.S.C. section 360bbb-3(b)(1), unless the authorization is terminated or revoked.  Performed at [facility], [address] [place], [place] [address]          Radiology Studies: No results found.      Scheduled Meds: . calcium carbonate  500 mg of elemental calcium Oral TID WC  . Chlorhexidine Gluconate Cloth  6 each Topical Daily  . feeding supplement (GLUCERNA SHAKE)  237 mL Oral TID BM  . heparin  5,000 Units Subcutaneous Q8H  . insulin aspart  0-5 Units Subcutaneous QHS  . insulin aspart  0-9 Units Subcutaneous TID WC  . midodrine  5 mg Oral TID WC  . tamsulosin  0.4 mg Oral QPC supper   Continuous Infusions: . sodium chloride      Assessment & Plan:   Principal Problem:   Acute kidney injury superimposed on CKD II ([place]) Active Problems:   Hypertension, goal below  140/90   Diabetes mellitus type 2, controlled, without complications (HCC)   BPH with obstruction/lower urinary tract symptoms   Generalized weakness   Increased anion gap metabolic acidosis   Abnormal chest CT   Hypocalcemia      Acute kidney injury superimposed on CKD II ([place]) fel this is from uremia t to be secondary to BPH with obstruction and causing secondary metabolic acidosis from uremia: Renal failure felt to be a combination of prerenal intake issues and postrenal obstruction issues.  Foley in place.  No abnormality or renal ultrasound.  Avoiding hypotension and nephrotoxic medications.  Urology is following and patient on Flomax.  Prostate biopsy rescheduled, possibly to be canceled.  Creatinine slowly trending downward, at 2.8 today.  Baseline around 1.5.  Have added some gentle IV fluids to hopefully improve this  Hypokalemia-mild Replaced and stable   Anemia -possibly secondary to  underlying CKD Hemoglobin slowly down, possibly part of it is dilutional Follow-up hemoglobin today actually shows mild increase.  No concerns for bleeding    Generalized weakness/weight loss Uncertain primary etiology. covid infection/dehydration/AKI likely attributing to weakness.  PT and OT recommending home health  Hypocalcemia- Calcium 6.3 Pharmacy managing  Hypertension- bp nml-low Hold bp meds Continue midodrine    Diabetes mellitus type 2, controlled, without complications (HCC) CBG stable, on sliding scale.    Abnormal CT/Covid 19 infection Patchy and nodular airspace disease right lower lobe. Imaging features are compatible with pneumonia, possibly atypical etiology. Follow-up CT chest without contrast in 3 months could be used to ensure resolution On room air  DVT prophylaxis: heparin Code Status:full Family Communication: Left message for family  Status is: Inpatient  Remains inpatient appropriate because:Hemodynamically unstable and Inpatient level of care  appropriate due to severity of illness   Dispo: The patient is from: Home              Anticipated d/c is to: home with [facility]              Patient currently is not medically stable to d/c.   Difficult to place patient No  Anticipate discharge hopefully this weekend once creatinine much closer to baseline.   LOS: 7 days     Annita Brod, MD [facility] Pager [phone]  If 7PM-7AM, please contact night-coverage 02/18/2021, 2:07 PM

## 2021-02-18 NOTE — Progress Notes (Signed)
[facility]  PROGRESS NOTE   Subjective:   Patient seen laying in bed Alert and oriented States he feels ready to go home He continue to have poor appetite but eats 25-50% of meals  Objective:  Vital signs in last 24 hours:  Temp:  [97.5 F (36.4 C)-98.5 F (36.9 C)] 98.1 F (36.7 C) (05/30 1152) Pulse Rate:  [76-93] 84 (05/30 1152) Resp:  [16-20] 17 (05/30 1152) BP: (102-139)/(52-69) 139/69 (05/30 1152) SpO2:  [93 %-98 %] 98 % (05/30 1152)  Weight change:  Filed Weights   02/11/21 1630  Weight: 68 kg    Intake/Output: I/O last 3 completed shifts: In: -  Out: 2275 [Urine:2275]   Intake/Output this shift:  Total I/O In: 240 [P.O.:240] Out: 600 [Urine:600]  Physical Exam: General:  No acute distress  Head:  Normocephalic, atraumatic. Moist oral mucosal membranes  Eyes:  Anicteric  Lungs:   Clear to auscultation, normal effort  Heart:  S1S2 no rubs  Abdomen:   Soft, nontender, bowel sounds present  Extremities:  trace peripheral edema.  Neurologic:  Awake, alert, following commands  Skin:  No lesions  GU Foley    Basic Metabolic Panel: Recent Labs  Lab 02/11/21 2104 02/12/21 0428 02/13/21 0536 02/14/21 0642 02/15/21 0911  NA 134* 136 139 140 138  K 4.9 4.2 3.8 3.8 3.4*  CL 104 106 102 98 91*  CO2 15* 16* 27 29 30   GLUCOSE 138* 122* 93 85 110*  BUN 108* 100* 82* 61* 49*  CREATININE 6.80* 5.72* 4.50* 3.98* 3.40*  CALCIUM 9.1 8.3* 7.4* 6.8* 6.3*  MG 1.9  --   --   --  1.3*    Liver Function Tests: Recent Labs  Lab 02/11/21 1057  AST 13*  ALT 10  ALKPHOS 62  BILITOT 1.2  PROT 7.7  ALBUMIN 3.5   No results for input(s): LIPASE, AMYLASE in the last 168 hours. No results for input(s): AMMONIA in the last 168 hours.  CBC: Recent Labs  Lab 02/11/21 1057 02/12/21 0428 02/15/21 0911  WBC 9.9 5.7 5.2  NEUTROABS 8.3*  --   --   HGB 10.9* 9.2* 8.5*  HCT 32.0* 26.8* 24.7*  MCV 88.6 87.3 88.5  PLT 241 195 154    Cardiac  Enzymes: No results for input(s): CKTOTAL, CKMB, CKMBINDEX, TROPONINI in the last 168 hours.  BNP: Invalid input(s): POCBNP  CBG: Recent Labs  Lab 02/14/21 1241 02/14/21 1755 02/14/21 2118 02/15/21 0758 02/15/21 1142  GLUCAP 75 90 86 88 103*    Microbiology: Results for orders placed or performed during the hospital encounter of 02/11/21  Urine culture     Status: None   Collection Time: 02/11/21 10:41 PM   Specimen: Urine, Random  Result Value Ref Range Status   Specimen Description   Final    URINE, RANDOM Performed at [facility], [address] [place], [place] [address]    Special Requests   Final    NONE Performed at [facility], [address] [place], [place] [address]    Culture   Final    NO GROWTH Performed at [facility], [place] [address], [place], [place] [address]    Report Status 02/13/2021 FINAL  Final  Resp Panel by RT-PCR (Flu A&B, Covid) Urine, Catheterized     Status: Abnormal   Collection Time: 02/11/21 10:41 PM   Specimen: Urine, Catheterized; Nasopharyngeal(NP) swabs in vial transport medium  Result Value Ref Range Status  SARS Coronavirus 2 by RT PCR POSITIVE (A) NEGATIVE Final    Comment: RESULT CALLED TO, READ BACK BY AND VERIFIED WITH: Leonides Schanz RN 0006 02/12/21 HNM (NOTE) SARS-CoV-2 target nucleic acids are DETECTED.  The SARS-CoV-2 RNA is generally detectable in upper respiratory specimens during the acute phase of infection. Positive results are indicative of the presence of the identified virus, but do not rule out bacterial infection or co-infection with other pathogens not detected by the test. Clinical correlation with patient history and other diagnostic information is necessary to determine patient infection status. The expected result is Negative.  Fact Sheet for Patients: EntrepreneurPulse.com.au  Fact Sheet for Healthcare  Providers: IncredibleEmployment.be  This test is not yet approved or cleared by the [place] FDA and  has been authorized for detection and/or diagnosis of SARS-CoV-2 by FDA under an Emergency Use Authorization (EUA).  This EUA will remain in effect (meaning this test can be  used) for the duration of  the COVID-19 declaration under Section 564(b)(1) of the Act, 21 U.S.C. section 360bbb-3(b)(1), unless the authorization is terminated or revoked sooner.     Influenza A by PCR NEGATIVE NEGATIVE Final   Influenza B by PCR NEGATIVE NEGATIVE Final    Comment: (NOTE) The Xpert Xpress SARS-CoV-2/FLU/RSV plus assay is intended as an aid in the diagnosis of influenza from Nasopharyngeal swab specimens and should not be used as a sole basis for treatment. Nasal washings and aspirates are unacceptable for Xpert Xpress SARS-CoV-2/FLU/RSV testing.  Fact Sheet for Patients: EntrepreneurPulse.com.au  Fact Sheet for Healthcare Providers: IncredibleEmployment.be  This test is not yet approved or cleared by the [place] FDA and has been authorized for detection and/or diagnosis of SARS-CoV-2 by FDA under an Emergency Use Authorization (EUA). This EUA will remain in effect (meaning this test can be used) for the duration of the COVID-19 declaration under Section 564(b)(1) of the Act, 21 U.S.C. section 360bbb-3(b)(1), unless the authorization is terminated or revoked.  Performed at [facility], [address] [place], [place] [address]     Coagulation Studies: No results for input(s): LABPROT, INR in the last 72 hours.  Urinalysis: No results for input(s): COLORURINE, LABSPEC, PHURINE, GLUCOSEU, HGBUR, BILIRUBINUR, KETONESUR, PROTEINUR, UROBILINOGEN, NITRITE, LEUKOCYTESUR in the last 72 hours.  Invalid input(s): APPERANCEUR    Imaging: No results found.   Medications:   . sodium chloride 75 mL/hr at 02/14/21  2336  . calcium gluconate    . magnesium sulfate bolus IVPB    .  sodium bicarbonate (isotonic) infusion in sterile water 125 mL/hr at 02/15/21 0608   . calcium carbonate  500 mg of elemental calcium Oral BID WC  . Chlorhexidine Gluconate Cloth  6 each Topical Daily  . heparin  5,000 Units Subcutaneous Q8H  . insulin aspart  0-5 Units Subcutaneous QHS  . insulin aspart  0-9 Units Subcutaneous TID WC  . midodrine  5 mg Oral TID WC  . tamsulosin  0.4 mg Oral QPC supper    Assessment/ Plan:     Principal Problem:   Acute kidney injury superimposed on CKD II (HCC) Active Problems:   Hypertension, goal below 140/90   Diabetes mellitus type 2, controlled, without complications (HCC)   BPH with obstruction/lower urinary tract symptoms   Generalized weakness   Increased anion gap metabolic acidosis   Abnormal chest CT  70 year old male with history of hypertension, coronary artery disease, diabetes, peripheral vascular disease, BPH, CKD now admitted with history of weight loss, cough, shortness  of breath and found to have significant metabolic acidosis, hypotension and acute kidney injury on top of chronic kidney disease.  #1 AKI: Most likely due to prerenal azotemia complicated by post renal issues. Renal sonogram is negative. Patient had a Foley catheter placed. Renal function continues to improve slowly Foley to remain ni place for 7-10 days, then voiding trial to be arranged by Urology.  Will avoid nephrotoxic medications including IV contrast.  #2 anemia, possibly secondary to CKD Hgb 9.2 Will continue to monitor  #3 hypocalcemia, possibly due to secondary hyperparathyroidism.   Calcium improved to 7.4 Continue calcium carbonate 1200 mg BID.  #4 COVID infection, stable at this time. Room air  #5 Hypokalemia supplement Potassium as ordered Will prescribe K 53mEq po daily  Current level 3.7     LOS: [place], MD [facility] 6/2/20221:42 PM

## 2021-02-18 NOTE — Plan of Care (Signed)
  Problem: Clinical Measurements: Goal: Ability to maintain clinical measurements within normal limits will improve Outcome: Progressing Goal: Will remain free from infection Outcome: Progressing Goal: Diagnostic test results will improve Outcome: Progressing Goal: Respiratory complications will improve Outcome: Progressing Goal: Cardiovascular complication will be avoided Outcome: Progressing   Problem: Pain Managment: Goal: General experience of comfort will improve Outcome: Progressing   Problem: Safety: Goal: Ability to remain free from injury will improve Outcome: Progressing   Pt is alert and oriented. V/S stable. Denies any pain or SOB. FC in place; draining well.

## 2021-02-18 NOTE — Progress Notes (Signed)
Physical Therapy Treatment Patient Details Name: Scott Woods MRN: [id number] DOB: 01/15/1951 Today's Date: 02/18/2021    History of Present Illness Pt is a 70 y.o. male with medical history significant for BPH with lower urinary obstructive symptoms currently undergoing evaluation by urology, elevated PSA insulin-dependent type 2 diabetes, HTN and stage II CKD, who was sent from his PCPs office for abnormal kidney function.  MD assessment includes: Acute kidney injury superimposed on CKD II, generalized weakness, HTN, and Covid 19 infection.    PT Comments    Pt agrees to session, stating he can walk just fine.  Stood with RW and min guard.  He is able to make 2 laps in room x 2 with seated rest.  Uses RW for balance.  Generally unsteady but is able to recover imbalances without assist.  Some fatigue noted but he continually states "I'm alright".  Discussed discharge plan at length and concerns for safe mobility it the home.  He is strongly opposed to SNF upon discharge.  Voiced concerns over balance and general deconditioning but he just states "I'm alright".  He lives alone with occasional assist from sister.  [facility].  Encouraged to consider HHPT upon discharge but he refuses stating that [facility] and will just drive there.  Concerns again voiced but he remains firm in his refusal of SNF but is agreeable to a RW for discharge.    While SNF remains best option for ensuring safety upon discharge he is strongly against it at this time.  Will leave DC recommendations for HHPT as he did show ability for short household distances with RW but I anticipate he will struggle some initially without increased assist in the home.  Pt is aware of my concerns and voices understanding but chooses to return home.   Follow Up Recommendations  Home health PT;Supervision for mobility/OOB     Equipment Recommendations  Rolling walker  with 5" wheels    Recommendations for Other Services       Precautions / Restrictions Precautions Precautions: Fall Restrictions Weight Bearing Restrictions: No    Mobility  Bed Mobility Overal bed mobility: Modified Independent                  Transfers Overall transfer level: Needs assistance Equipment used: Rolling walker (2 wheeled) Transfers: Sit to/from Stand Sit to Stand: Min guard;Supervision            Ambulation/Gait Ambulation/Gait assistance: Min guard Gait Distance (Feet): 60 Feet Assistive device: Rolling walker (2 wheeled) Gait Pattern/deviations: Step-through pattern;Decreased step length - right;Decreased step length - left;Narrow base of support Gait velocity: decreased   General Gait Details: 60' x 2 in room with turn   Stairs             Wheelchair Mobility    Modified Rankin (Stroke Patients Only)       Balance Overall balance assessment: Needs assistance Sitting-balance support: Feet supported Sitting balance-Leahy Scale: Good     Standing balance support: Bilateral upper extremity supported;During functional activity Standing balance-Leahy Scale: Fair                              Cognition Arousal/Alertness: Awake/alert Behavior During Therapy: WFL for tasks assessed/performed Overall Cognitive Status: Within Functional Limits for tasks assessed  General Comments: seems to have decreased awareness of situation/condition      Exercises      General Comments        Pertinent Vitals/Pain Pain Assessment: No/denies pain    Home Living                      Prior Function            PT Goals (current goals can now be found in the care plan section) Progress towards PT goals: Progressing toward goals    Frequency    Min 2X/week      PT Plan Discharge plan needs to be updated    Co-evaluation              AM-PAC PT "6  Clicks" Mobility   Outcome Measure  Help needed turning from your back to your side while in a flat bed without using bedrails?: None Help needed moving from lying on your back to sitting on the side of a flat bed without using bedrails?: A Little Help needed moving to and from a bed to a chair (including a wheelchair)?: A Little Help needed standing up from a chair using your arms (e.g., wheelchair or bedside chair)?: A Little Help needed to walk in hospital room?: A Lot Help needed climbing 3-5 steps with a railing? : A Lot 6 Click Score: 17    End of Session Equipment Utilized During Treatment: Gait belt Activity Tolerance: Patient tolerated treatment well (limited by dry heaving and gagging during session.) Patient left: in chair;with call bell/phone within reach;with chair alarm set Nurse Communication: Other (comment) (dry heaving and gagging.) PT Visit Diagnosis: Difficulty in walking, not elsewhere classified (R26.2);Muscle weakness (generalized) (M62.81)     Time: 3785-8850 PT Time Calculation (min) (ACUTE ONLY): 24 min  Charges:  $Gait Training: 23-37 mins                    Chesley Noon, PTA 02/18/21, 1:42 PM

## 2021-02-18 NOTE — Progress Notes (Signed)
[place] for Electrolyte Monitoring and Replacement   Recent Labs: Potassium (mmol/L)  Date Value  02/18/2021 3.7   Magnesium (mg/dL)  Date Value  02/17/2021 1.9   Calcium (mg/dL)  Date Value  02/18/2021 7.4 (L)   Albumin (g/dL)  Date Value  02/11/2021 3.5   Phosphorus (mg/dL)  Date Value  02/16/2021 4.5   Sodium (mmol/L)  Date Value  02/18/2021 138   Corrected Calcium 7.7 mg/dL  Assessment: 69 y.o.malewith medical history significant for BPH with lower urinary obstructive symptoms currently undergoing evaluation by urology, elevated PSA insulin-dependent type 2 diabetes, HTN and stage II CKD, who was sent from his PCPs office for abnormal kidney function. Pharmacy has been consulted for electrolyte management.  Goal of Therapy:  Electrolytes WNL  Plan:   No electrolyte replacement warranted for today  Re-check electrolytes with AM labs  Dallie Piles, PharmD, BCPS Clinical Pharmacist 02/18/2021 7:40 AM

## 2021-02-18 NOTE — TOC Progression Note (Signed)
Transition of Care [facility]) - Progression Note    Patient Details  Name: Scott Woods MRN: [id number] Date of Birth: 10/26/1950  Transition [facility]) CM/SW Contact  Beverly Sessions, RN Phone Number: 02/18/2021, 3:26 PM  Clinical Narrative:     Patient agreeable to RW.  Referral made to [facility] with [facility].  To be delivered to room Per MD note anticipated DC this weekend.  Plan remains for home health services through [facility].  Patient states that sister will be picking him up at discharge   Expected Discharge Plan: [place] Barriers to Discharge: Continued Medical Work up  Expected Discharge Plan and Services Expected Discharge Plan: [place] In-house Referral: Clinical Social Work   Post Acute Care Choice: NA Living arrangements for the past 2 months: Apartment                 DME Arranged: Gilford Rile rolling DME Agency: [employer] Date DME Agency Contacted: 02/18/21   Representative spoke with at DME Agency: [place]: PT [place]: [place] ([place]) Date [place]: 02/15/21 Time [place]: 1237 Representative spoke with at [place]: [place] (SDOH) Interventions    Readmission Risk Interventions No flowsheet data found.

## 2021-02-18 NOTE — Progress Notes (Signed)
Occupational Therapy Treatment Patient Details Name: Scott Woods MRN: [id number] DOB: 08/19/1951 Today's Date: 02/18/2021    History of present illness Pt is a 70 y.o. male with medical history significant for BPH with lower urinary obstructive symptoms currently undergoing evaluation by urology, elevated PSA insulin-dependent type 2 diabetes, HTN and stage II CKD, who was sent from his PCPs office for abnormal kidney function.  MD assessment includes: Acute kidney injury superimposed on CKD II, generalized weakness, HTN, and Covid 19 infection.   OT comments  Pt seen for OT tx this date to f/u re: safety with ADLs/ADL mobility. Pt able to transition to sitting from laying in bed with MOD I (increasd time and use of rails). Seated EOB, pt requires MIN A to don socks. Pt requires MIN A for SPS transfer from EOB to chair. Noted to have flexed hips in standing requiring cues to extend. While seated in chair OT engages pt in [place] for 2 sets x10 reps and explains rationale with demo. Pt with good understanding. Pt continues to lack insight into deficits, continuing to insist that he is going home to live alone s/p hospital stay. Pt left in chair with chair alarm on, all needs met and in reach. Pt requesting a mask and RN notified. Will continue to follow acutely. Continue to anticipate that pt will require STR upon d/c as he is still quite unsteady and requiring assist for ADLs/ADL mobility d/t decreased balance and tolerance.   Follow Up Recommendations  SNF    Equipment Recommendations  Other (comment) (defer)    Recommendations for Other Services      Precautions / Restrictions Precautions Precautions: Fall Restrictions Weight Bearing Restrictions: No       Mobility Bed Mobility Overal bed mobility: Modified Independent             General bed mobility comments: increased time and effortful to come to EOB Sitting with HOB elevated and use of rails    Transfers Overall transfer  level: Needs assistance Equipment used: Rolling walker (2 wheeled) Transfers: Sit to/from [facility] Sit to Stand: Min guard Stand pivot transfers: Min assist       General transfer comment: requires increased time, demos flexed hips upon initially coming to stand requiring verbal/tactile cues to extend hips into fully erect standing posture, but then he is unable to maintain and requires increased assist ot balance    Balance Overall balance assessment: Needs assistance Sitting-balance support: Feet supported Sitting balance-[place]: Good   Postural control: Posterior lean Standing balance support: Bilateral upper extremity supported;During functional activity Standing balance-[place]: Fair Standing balance comment: requires UE support, loses balance posteriorly w/o assistance                           ADL either performed or assessed with clinical judgement   ADL Overall ADL's : Needs assistance/impaired     Grooming: Oral care;Set up;Sitting Grooming Details (indicate cue type and reason): EOB Sitting             Lower Body Dressing: Minimal assistance;Sitting/lateral leans Lower Body Dressing Details (indicate cue type and reason): to don socks                     Vision Patient Visual Report: No change from baseline     Perception     Praxis      Cognition Arousal/Alertness: Awake/alert Behavior During Therapy: [facility] for tasks  assessed/performed Overall Cognitive Status: Within Functional Limits for tasks assessed                                 General Comments: decreased safety awareness/lacks insight into deficits. Pt still insiting he can go home alone although he is requiring assist to stand and transfer and lives alone.        Exercises Other Exercises Other Exercises: Pt requires ed re: safe hand placement with RW use for ADL transfers. Pt with moderate reception/carryover. Other Exercises:  OT engages pt in [place] for 2 sets x10 reps to improve pt pelvis positioning in standing to reduce risk of falls.   Shoulder Instructions       General Comments      Pertinent Vitals/ Pain       Pain Assessment: No/denies pain  Home Living                                          Prior Functioning/Environment              Frequency  Min 1X/week        Progress Toward Goals  OT Goals(current goals can now be found in the care plan section)  Progress towards OT goals: Progressing toward goals  Acute Rehab OT Goals Patient Stated Goal: To return home OT Goal Formulation: With patient Time For Goal Achievement: 03/01/21 Potential to Achieve Goals: Good  Plan Discharge plan remains appropriate    Co-evaluation                 AM-PAC OT "6 Clicks" Daily Activity     Outcome Measure   Help from another person eating meals?: None Help from another person taking care of personal grooming?: A Little Help from another person toileting, which includes using toliet, bedpan, or urinal?: A Lot Help from another person bathing (including washing, rinsing, drying)?: A Lot Help from another person to put on and taking off regular upper body clothing?: A Little Help from another person to put on and taking off regular lower body clothing?: A Little 6 Click Score: 17    End of Session Equipment Utilized During Treatment: Gait belt;Rolling walker  OT Visit Diagnosis: Unsteadiness on feet (R26.81);Muscle weakness (generalized) (M62.81)   Activity Tolerance Patient tolerated treatment well   Patient Left in bed;with call bell/phone within reach;with bed alarm set;with nursing/sitter in room   Nurse Communication Mobility status        Time: 8101-7510 OT Time Calculation (min): 11 min  Charges: OT General Charges $OT Visit: 1 Visit OT Treatments $Therapeutic Activity: 8-22 mins  Scott Woods, Scott Woods, OTR/L ascom [phone] 02/18/21, 4:07 PM

## 2021-02-19 DIAGNOSIS — U071 COVID-19: Secondary | ICD-10-CM | POA: Diagnosis not present

## 2021-02-19 DIAGNOSIS — R338 Other retention of urine: Secondary | ICD-10-CM

## 2021-02-19 DIAGNOSIS — R531 Weakness: Secondary | ICD-10-CM

## 2021-02-19 DIAGNOSIS — N401 Enlarged prostate with lower urinary tract symptoms: Secondary | ICD-10-CM | POA: Diagnosis not present

## 2021-02-19 DIAGNOSIS — N179 Acute kidney failure, unspecified: Secondary | ICD-10-CM | POA: Diagnosis not present

## 2021-02-19 LAB — BASIC METABOLIC PANEL
Anion gap: 7 (ref 5–15)
BUN: 25 mg/dL — ABNORMAL HIGH (ref 8–23)
CO2: 29 mmol/L (ref 22–32)
Calcium: 7.6 mg/dL — ABNORMAL LOW (ref 8.9–10.3)
Chloride: 102 mmol/L (ref 98–111)
Creatinine, Ser: 2.76 mg/dL — ABNORMAL HIGH (ref 0.61–1.24)
GFR, Estimated: 24 mL/min — ABNORMAL LOW (ref >60)
Glucose, Bld: 119 mg/dL — ABNORMAL HIGH (ref 70–99)
Potassium: 3.7 mmol/L (ref 3.5–5.1)
Sodium: 138 mmol/L (ref 135–145)

## 2021-02-19 LAB — GLUCOSE, CAPILLARY
Glucose-Capillary: 102 mg/dL — ABNORMAL HIGH (ref 70–99)
Glucose-Capillary: 136 mg/dL — ABNORMAL HIGH (ref 70–99)

## 2021-02-19 MED ORDER — TAMSULOSIN HCL 0.4 MG PO CAPS: 0.4000 mg | ORAL_CAPSULE | Freq: Every day | ORAL | 0 refills | Status: DC

## 2021-02-19 NOTE — Progress Notes (Signed)
[facility]  PROGRESS NOTE   Subjective:   Patient seen laying in bed Alert and oriented Tolerating meals and denies nausea No complaints at this time   Objective:  Vital signs in last 24 hours:  Temp:  [97.5 F (36.4 C)-98.5 F (36.9 C)] 98.1 F (36.7 C) (05/30 1152) Pulse Rate:  [76-93] 84 (05/30 1152) Resp:  [16-20] 17 (05/30 1152) BP: (102-139)/(52-69) 139/69 (05/30 1152) SpO2:  [93 %-98 %] 98 % (05/30 1152)  Weight change:  Filed Weights   02/11/21 1630  Weight: 68 kg    Intake/Output: I/O last 3 completed shifts: In: -  Out: 2275 [Urine:2275]   Intake/Output this shift:  No intake/output data recorded.  Physical Exam: General:  No acute distress  Head:  Normocephalic, atraumatic. Moist oral mucosal membranes  Eyes:  Anicteric  Lungs:   Clear to auscultation, normal effort  Heart:  S1S2 no rubs  Abdomen:   Soft, nontender, bowel sounds present  Extremities:  trace peripheral edema.  Neurologic:  Awake, alert, following commands  Skin:  No lesions  GU Foley    Basic Metabolic Panel: Recent Labs  Lab 02/11/21 2104 02/12/21 0428 02/13/21 0536 02/14/21 0642 02/15/21 0911  NA 134* 136 139 140 138  K 4.9 4.2 3.8 3.8 3.4*  CL 104 106 102 98 91*  CO2 15* 16* 27 29 30   GLUCOSE 138* 122* 93 85 110*  BUN 108* 100* 82* 61* 49*  CREATININE 6.80* 5.72* 4.50* 3.98* 3.40*  CALCIUM 9.1 8.3* 7.4* 6.8* 6.3*  MG 1.9  --   --   --  1.3*    Liver Function Tests: Recent Labs  Lab 02/11/21 1057  AST 13*  ALT 10  ALKPHOS 62  BILITOT 1.2  PROT 7.7  ALBUMIN 3.5   No results for input(s): LIPASE, AMYLASE in the last 168 hours. No results for input(s): AMMONIA in the last 168 hours.  CBC: Recent Labs  Lab 02/11/21 1057 02/12/21 0428 02/15/21 0911  WBC 9.9 5.7 5.2  NEUTROABS 8.3*  --   --   HGB 10.9* 9.2* 8.5*  HCT 32.0* 26.8* 24.7*  MCV 88.6 87.3 88.5  PLT 241 195 154    Cardiac Enzymes: No results for input(s): CKTOTAL, CKMB,  CKMBINDEX, TROPONINI in the last 168 hours.  BNP: Invalid input(s): POCBNP  CBG: Recent Labs  Lab 02/14/21 1241 02/14/21 1755 02/14/21 2118 02/15/21 0758 02/15/21 1142  GLUCAP 75 90 86 88 103*    Microbiology: Results for orders placed or performed during the hospital encounter of 02/11/21  Urine culture     Status: None   Collection Time: 02/11/21 10:41 PM   Specimen: Urine, Random  Result Value Ref Range Status   Specimen Description   Final    URINE, RANDOM Performed at [facility], [address] [place], [place] [address]    Special Requests   Final    NONE Performed at [facility], [address] [place], [place] [address]    Culture   Final    NO GROWTH Performed at [facility], [place] [address], [place], [place] [address]    Report Status 02/13/2021 FINAL  Final  Resp Panel by RT-PCR (Flu A&B, Covid) Urine, Catheterized     Status: Abnormal   Collection Time: 02/11/21 10:41 PM   Specimen: Urine, Catheterized; Nasopharyngeal(NP) swabs in vial transport medium  Result Value Ref Range Status   SARS Coronavirus 2 by RT PCR POSITIVE (A) NEGATIVE Final  Comment: RESULT CALLED TO, READ BACK BY AND VERIFIED WITH: Leonides Schanz RN 0006 02/12/21 HNM (NOTE) SARS-CoV-2 target nucleic acids are DETECTED.  The SARS-CoV-2 RNA is generally detectable in upper respiratory specimens during the acute phase of infection. Positive results are indicative of the presence of the identified virus, but do not rule out bacterial infection or co-infection with other pathogens not detected by the test. Clinical correlation with patient history and other diagnostic information is necessary to determine patient infection status. The expected result is Negative.  Fact Sheet for Patients: EntrepreneurPulse.com.au  Fact Sheet for Healthcare Providers: IncredibleEmployment.be  This test is not yet approved or cleared by  the [place] FDA and  has been authorized for detection and/or diagnosis of SARS-CoV-2 by FDA under an Emergency Use Authorization (EUA).  This EUA will remain in effect (meaning this test can be  used) for the duration of  the COVID-19 declaration under Section 564(b)(1) of the Act, 21 U.S.C. section 360bbb-3(b)(1), unless the authorization is terminated or revoked sooner.     Influenza A by PCR NEGATIVE NEGATIVE Final   Influenza B by PCR NEGATIVE NEGATIVE Final    Comment: (NOTE) The Xpert Xpress SARS-CoV-2/FLU/RSV plus assay is intended as an aid in the diagnosis of influenza from Nasopharyngeal swab specimens and should not be used as a sole basis for treatment. Nasal washings and aspirates are unacceptable for Xpert Xpress SARS-CoV-2/FLU/RSV testing.  Fact Sheet for Patients: EntrepreneurPulse.com.au  Fact Sheet for Healthcare Providers: IncredibleEmployment.be  This test is not yet approved or cleared by the [place] FDA and has been authorized for detection and/or diagnosis of SARS-CoV-2 by FDA under an Emergency Use Authorization (EUA). This EUA will remain in effect (meaning this test can be used) for the duration of the COVID-19 declaration under Section 564(b)(1) of the Act, 21 U.S.C. section 360bbb-3(b)(1), unless the authorization is terminated or revoked.  Performed at [facility], [address] [place], [place] [address]     Coagulation Studies: No results for input(s): LABPROT, INR in the last 72 hours.  Urinalysis: No results for input(s): COLORURINE, LABSPEC, PHURINE, GLUCOSEU, HGBUR, BILIRUBINUR, KETONESUR, PROTEINUR, UROBILINOGEN, NITRITE, LEUKOCYTESUR in the last 72 hours.  Invalid input(s): APPERANCEUR    Imaging: No results found.   Medications:   . sodium chloride 75 mL/hr at 02/14/21 2336  . calcium gluconate    . magnesium sulfate bolus IVPB    .  sodium bicarbonate (isotonic)  infusion in sterile water 125 mL/hr at 02/15/21 0608   . calcium carbonate  500 mg of elemental calcium Oral BID WC  . Chlorhexidine Gluconate Cloth  6 each Topical Daily  . heparin  5,000 Units Subcutaneous Q8H  . insulin aspart  0-5 Units Subcutaneous QHS  . insulin aspart  0-9 Units Subcutaneous TID WC  . midodrine  5 mg Oral TID WC  . tamsulosin  0.4 mg Oral QPC supper    Assessment/ Plan:     Principal Problem:   Acute kidney injury superimposed on CKD II (HCC) Active Problems:   Hypertension, goal below 140/90   Diabetes mellitus type 2, controlled, without complications (HCC)   BPH with obstruction/lower urinary tract symptoms   Generalized weakness   Increased anion gap metabolic acidosis   Abnormal chest CT  70 year old male with history of hypertension, coronary artery disease, diabetes, peripheral vascular disease, BPH, CKD now admitted with history of weight loss, cough, shortness of breath and found to have significant metabolic acidosis, hypotension and acute kidney  injury on top of chronic kidney disease.  #1 AKI: Most likely due to prerenal azotemia complicated by post renal issues. Renal sonogram is negative. Patient had a Foley catheter placed. Renal function has remained steady for couple days. Foley to remain ni place for 7-10 days, then voiding trial to be arranged by Urology.  Will avoid nephrotoxic medications including IV contrast. Will follow up with nephrology at discharge in 1 week  #2 anemia, possibly secondary to CKD Hgb 9.2 Will continue to monitor  #3 hypocalcemia, possibly due to secondary hyperparathyroidism.   Calcium improved to 7.6 Calcium carbonate 1200 mg BID.  #4 COVID infection, stable at this time. Room air  #5 Hypokalemia supplement Potassium as ordered Will prescribe K 44mEq po daily  Stable     LOS: 8 Colon Flattery, MD [facility] 6/3/202210:09 AM

## 2021-02-19 NOTE — Progress Notes (Signed)
PT Cancellation Note  Patient Details Name: [name] MRN: [id number] DOB: 16-Nov-1950   Cancelled Treatment:    Reason Eval/Treat Not Completed: Other (comment)   Offered and encouraged session.  Pt flatly refused stating he wants to go home and is waiting for MD so he can get some real food.  (pt with pureed meal tray untouched on table).  Continues to refuse session despite encouragement "I'm fine."   Chesley Noon 02/19/2021, 2:42 PM

## 2021-02-19 NOTE — Plan of Care (Signed)

## 2021-02-19 NOTE — Progress Notes (Signed)
Pt's PIV removed with tip intact. Discharge instructions explained with pt. Pt denies any questions or concerns. Pt shown foley care and how to empty standard drainage bag. Pt does not want leg bag. Pt to follow up with urology early next week and have voiding trial. Pt agreeable to plan of care and treatment. Pt sister to pick up pt from [facility].

## 2021-02-19 NOTE — Progress Notes (Signed)
[facility] for Electrolyte Monitoring and Replacement   Recent Labs: Potassium (mmol/L)  Date Value  02/19/2021 3.7   Magnesium (mg/dL)  Date Value  02/17/2021 1.9   Calcium (mg/dL)  Date Value  02/19/2021 7.6 (L)   Albumin (g/dL)  Date Value  02/11/2021 3.5   Phosphorus (mg/dL)  Date Value  02/16/2021 4.5   Sodium (mmol/L)  Date Value  02/19/2021 138   Corrected Calcium 8 mg/dL  Assessment: 70 y.o.malewith medical history significant for BPH with lower urinary obstructive symptoms currently undergoing evaluation by urology, elevated PSA insulin-dependent type 2 diabetes, HTN and stage II CKD, who was sent from his PCPs office for abnormal kidney function. Pharmacy has been consulted for electrolyte management.  Goal of Therapy:  Electrolytes WNL  Plan:   No electrolyte replacement warranted for today  Re-check electrolytes with AM labs  Dallie Piles, PharmD, BCPS Clinical Pharmacist 02/19/2021 8:02 AM

## 2021-02-19 NOTE — Progress Notes (Signed)
Mobility Specialist - Progress Note   02/19/21 1600  Mobility  Activity Refused mobility  Mobility performed by Mobility specialist    Pt declined mobility, no reason specified. States he's ready to go home. Pt anticipating d/c later this date.    Kathee Delton Mobility Specialist 02/19/21, 4:01 PM

## 2021-02-19 NOTE — Discharge Summary (Signed)
Discharge Summary  ODES LOLLI TDS:[id number] DOB: [birth year]  PCP: [facility] date: 02/11/2021 Discharge date: 02/19/2021  Time spent: 25 minutes  Recommendations for Outpatient Follow-up:  1. Patient will follow-up in 1 week with Dr.Kolloru of nephrology 2. Patient will follow up with Debroah Loop of Urology early next week in the office for Foley catheter removal and voiding trial 3. New medication: Flomax 0.4 mg p.o. daily 4. Medication change: Lisinopril discontinued 5. Medication change: At this time, aspirin discontinued 6. Patient will follow up with his PCP in 1 month 7. Chest CT with contrast recommended in 3 months to follow-up airspace disease/questionable nodule  Discharge Diagnoses:  Active Hospital Problems   Diagnosis Date Noted  . Acute kidney injury superimposed on CKD II ([place]) 02/11/2021  . Hypocalcemia 02/17/2021  . Increased anion gap metabolic acidosis 26/20/3559  . Abnormal chest CT 02/12/2021  . Generalized weakness 02/11/2021  . BPH with obstruction/lower urinary tract symptoms 04/01/2019  . Hypertension, goal below 140/90 05/08/2015  . Diabetes mellitus type 2, controlled, without complications ([place]) 74/16/3845    Resolved Hospital Problems  No resolved problems to display.    Discharge Condition: Improved, being discharged home  Diet recommendation: Dysphagia 1 diet  Vitals:   02/19/21 0457 02/19/21 1200  BP: 120/77 140/88  Pulse: 93 97  Resp: 20 18  Temp: 98.1 F (36.7 C) 98.3 F (36.8 C)  SpO2: 97% 98%    History of present illness:  Scott Woods a 70 y.o.malewith medical history significant for BPH with lower urinary obstructive symptoms currently undergoing evaluation by urology, elevated PSA insulin-dependent type 2 diabetes, HTN and stage II CKD, who was sent from his PCPs office for abnormal kidney function. Patient had been complaining of generalized weakness and weight loss of over 20  pounds for the past 3 months and prostate biopsy was being arranged.  In the emergency room, patient was found of a creatinine of 6.8.  Unable to void on his own patient had a Foley catheter placed and urology was following.  Admitted to the hospitalist service.  He was also incidentally found to have COVID.  Hospital Course:  Acute kidney injury superimposed on CKD II ([place]) fel this is from uremia t to be secondary to BPH with obstruction and causing secondary metabolic acidosis from uremia: Renal failure felt to be a combination of prerenal intake issues and postrenal obstruction issues.  Foley in place.  No abnormality or renal ultrasound.  Avoiding hypotension and nephrotoxic medications.  Urology evaluated and patient started on Flomax.  Prostate biopsy rescheduled, possibly to be canceled.    Baseline creatinine around 1.5.  Creatinine continues to trend downward and was 2.75 x 6/2.  No change Busick/300 scheduled with nephrology, still early to tell if this is patient's new baseline versus just delayed in improvement.  At this point, he does not need to be in the hospital and this can be followed up as outpatient.  Recommending discontinuing nephrotoxic medications including ACE inhibitor.  Aspirin on hold.  Patient will follow up with nephrology in 1 week.  Enlarged prostate: Patient will continue finasteride.  Discharged on Flomax as well.  We will follow-up with urology in 1 week.  At that time removal of Foley and voiding trial will take place.  Hypokalemia-mild Replaced and stable   Anemia -possibly secondary to  underlying CKD Hemoglobin slowly down, possibly part of it is dilutional Follow-up hemoglobin on 6/2 actually shows mild increase.  No concerns  for bleeding  Generalized weakness/weight loss Uncertain primary etiology. covid infection/dehydration/AKI likely attributing to weakness.    Setting up PT and OT home health  Hypocalcemia- Calcium 6.3 Received supplementation  for this  Hypertension-during patient's hospital stay, blood pressure low to normal.  Lisinopril was put on hold in part due to hypotension and also in part due to renal dysfunction.  Was put on midodrine.  By day of discharge, blood pressure was normal to slightly elevated and so he will be discharged without midodrine, but with instructions to discontinue his lisinopril.  Nephrology will follow as outpatient.  Diabetes mellitus type 2, controlled, without complications (HCC) CBG stable, on sliding scale.  Abnormal CT/Covid 19 infection: Incidentally noted to be in this patient.  Patient not hypoxic   Procedures:  Foley catheter placement  Consultations:  Nephrology  Urology  Discharge Exam: BP 140/88 (BP Location: Right Arm)   Pulse 97   Temp 98.3 F (36.8 C) (Oral)   Resp 18   Ht 5\' 10"  (1.778 m)   Wt 68 kg   SpO2 98%   BMI 21.52 kg/m   General: Alert and oriented x3, no acute distress Cardiovascular: Regular rate and rhythm, S1-S2 Respiratory: Clear to auscultation bilaterally  Discharge Instructions You were cared for by a hospitalist during your hospital stay. If you have any questions about your discharge medications or the care you received while you were in the hospital after you are discharged, you can call the unit and asked to speak with the hospitalist on call if the hospitalist that took care of you is not available. Once you are discharged, your primary care physician will handle any further medical issues. Please note that NO REFILLS for any discharge medications will be authorized once you are discharged, as it is imperative that you return to your primary care physician (or establish a relationship with a primary care physician if you do not have one) for your aftercare needs so that they can reassess your need for medications and monitor your lab values.  Discharge Instructions    Diet - low sodium heart healthy   Complete by: As directed    Increase  activity slowly   Complete by: As directed      Allergies as of 02/19/2021   No Known Allergies     Medication List    STOP taking these medications   aspirin EC 81 MG tablet   lisinopril 10 MG tablet Commonly known as: ZESTRIL     TAKE these medications   atorvastatin 40 MG tablet Commonly known as: LIPITOR TAKE 1 TABLET BY MOUTH EVERYDAY AT BEDTIME What changed:   how much to take  how to take this  when to take this   finasteride 5 MG tablet Commonly known as: PROSCAR TAKE 1 TABLET BY MOUTH EVERY DAY   metFORMIN 500 MG tablet Commonly known as: GLUCOPHAGE Take 2 tablets twice daily with meals   tamsulosin 0.4 MG Caps capsule Commonly known as: FLOMAX Take 1 capsule (0.4 mg total) by mouth daily after supper.            Durable Medical Equipment  (From admission, onward)         Start     Ordered   02/18/21 1526  For home use only DME Walker rolling  Once       Question Answer Comment  Walker: With 5 Inch Wheels   Patient needs a walker to treat with the following condition Weakness  02/18/21 1525         No Known [facility] Follow up in 1 month(s).   Specialty: Family Medicine Contact information: [place] [address] [phone]        Debroah Loop, PA-C Follow up on 02/22/2021.   Specialty: Urology Why: Call office to confirm appointment early next week (hospital followup)  Contact information: [place] [address] [phone]        Lavonia Dana, MD. Schedule an appointment as soon as possible for a visit in 1 week(s).   Specialty: Nephrology Contact information: [place] [address] [phone]                The results of significant diagnostics from this hospitalization (including imaging, microbiology, ancillary and laboratory) are listed below for  reference.    Significant Diagnostic Studies: US RENAL  Result Date: 02/12/2021 CLINICAL DATA:  Acute renal injury EXAM: RENAL / URINARY TRACT ULTRASOUND COMPLETE COMPARISON:  CT from the previous day. FINDINGS: Right Kidney: Renal measurements: 9.0 x 4.7 x 5.5 cm. = volume: 123 mL. Echogenicity within normal limits. No mass or hydronephrosis visualized. Left Kidney: Renal measurements: 10.3 x 5.2 x 5.8 cm. = volume: 163 mL. Echogenicity within normal limits. No mass or hydronephrosis visualized. Bladder: Decompressed by Foley catheter. Other: None. IMPRESSION: No acute renal abnormality noted. Electronically Signed   By: Inez Catalina M.D.   On: 02/12/2021 18:10   DG Chest Portable 1 View  Result Date: 02/11/2021 CLINICAL DATA:  Cough EXAM: PORTABLE CHEST 1 VIEW COMPARISON:  10/10/2019 FINDINGS: The heart size and mediastinal contours are within normal limits. Both lungs are clear. Chronic left upper rib deformities. IMPRESSION: No active disease. Electronically Signed   By: Donavan Foil M.D.   On: 02/11/2021 22:49   CT Renal Stone Study  Result Date: 02/11/2021 CLINICAL DATA:  Hematuria. EXAM: CT ABDOMEN AND PELVIS WITHOUT CONTRAST TECHNIQUE: Multidetector CT imaging of the abdomen and pelvis was performed following the standard protocol without IV contrast. COMPARISON:  11/16/2016 FINDINGS: Lower chest: Patchy and nodular airspace disease noted right lower lobe. No pleural effusion. Hepatobiliary: No focal abnormality in the liver on this study without intravenous contrast. 9 mm calcified gallstone evident. No intrahepatic or extrahepatic biliary dilation. Pancreas: No focal mass lesion. No dilatation of the main duct. No intraparenchymal cyst. No peripancreatic edema. Spleen: No splenomegaly. No focal mass lesion. Adrenals/Urinary Tract: Stable 17 mm left adrenal adenoma. Right adrenal gland unremarkable. No stones are seen in either kidney or ureter. No secondary changes in either kidney or ureter.  Bladder is distended with mass-effect on the bladder base presumably related to median lobe hypertrophy of the prostate gland. Small bladder diverticuli noted. Stomach/Bowel: Stomach is unremarkable. No gastric wall thickening. No evidence of outlet obstruction. Duodenum is normally positioned as is the ligament of Treitz. No small bowel wall thickening. No small bowel dilatation. The terminal ileum is normal. The appendix is normal. No gross colonic mass. No colonic wall thickening. Diverticular changes are noted in the left colon without evidence of diverticulitis. Vascular/Lymphatic: There is abdominal aortic atherosclerosis without aneurysm. There is no gastrohepatic or hepatoduodenal ligament lymphadenopathy. No retroperitoneal or mesenteric lymphadenopathy. No pelvic sidewall lymphadenopathy. Reproductive: Prostate gland is enlarged. Other: No intraperitoneal free fluid. Musculoskeletal: No worrisome lytic or sclerotic osseous abnormality. IMPRESSION: 1. Patchy and nodular airspace disease right lower lobe. Imaging features are  compatible with pneumonia, possibly atypical etiology. Follow-up CT chest without contrast in 3 months could be used to ensure resolution. 2. Cholelithiasis. 3. Stable 17 mm left adrenal adenoma. 4. Left colonic diverticulosis without diverticulitis. 5. Prostatomegaly. Associated bladder diverticuli raise the question of underlying component of bladder outlet obstruction. 6. Aortic Atherosclerosis (ICD10-I70.0). Electronically Signed   By: Misty Stanley M.D.   On: 02/11/2021 21:30    Microbiology: Recent Results (from the past 240 hour(s))  Urine culture     Status: None   Collection Time: 02/11/21 10:41 PM   Specimen: Urine, Random  Result Value Ref Range Status   Specimen Description   Final    URINE, RANDOM Performed at [facility], [address] [place], [place] [address]    Special Requests   Final    NONE Performed at [facility], [address] [place], [place] [address]    Culture   Final    NO GROWTH Performed at [facility], [place] [address] [place], [place] [address]    Report Status 02/13/2021 FINAL  Final  Resp Panel by RT-PCR (Flu A&B, Covid) Urine, Catheterized     Status: Abnormal   Collection Time: 02/11/21 10:41 PM   Specimen: Urine, Catheterized; Nasopharyngeal(NP) swabs in vial transport medium  Result Value Ref Range Status   SARS Coronavirus 2 by RT PCR POSITIVE (A) NEGATIVE Final    Comment: RESULT CALLED TO, READ BACK BY AND VERIFIED WITH: Leonides Schanz RN 0006 02/12/21 HNM (NOTE) SARS-CoV-2 target nucleic acids are DETECTED.  The SARS-CoV-2 RNA is generally detectable in upper respiratory specimens during the acute phase of infection. Positive results are indicative of the presence of the identified virus, but do not rule out bacterial infection or co-infection with other pathogens not detected by the test. Clinical correlation with patient history and other diagnostic information is necessary to determine patient infection status. The expected result is Negative.  Fact Sheet for Patients: EntrepreneurPulse.com.au  Fact Sheet for Healthcare Providers: IncredibleEmployment.be  This test is not yet approved or cleared by the [place] FDA and  has been authorized for detection and/or diagnosis of SARS-CoV-2 by FDA under an Emergency Use Authorization (EUA).  This EUA will remain in effect (meaning this test can be  used) for the duration of  the COVID-19 declaration under Section 564(b)(1) of the Act, 21 U.S.C. section 360bbb-3(b)(1), unless the authorization is terminated or revoked sooner.     Influenza A by PCR NEGATIVE NEGATIVE Final   Influenza B by PCR NEGATIVE NEGATIVE Final    Comment: (NOTE) The Xpert Xpress SARS-CoV-2/FLU/RSV plus assay is intended as an aid in the diagnosis of influenza from Nasopharyngeal swab specimens and should not be  used as a sole basis for treatment. Nasal washings and aspirates are unacceptable for Xpert Xpress SARS-CoV-2/FLU/RSV testing.  Fact Sheet for Patients: EntrepreneurPulse.com.au  Fact Sheet for Healthcare Providers: IncredibleEmployment.be  This test is not yet approved or cleared by the [place] FDA and has been authorized for detection and/or diagnosis of SARS-CoV-2 by FDA under an Emergency Use Authorization (EUA). This EUA will remain in effect (meaning this test can be used) for the duration of the COVID-19 declaration under Section 564(b)(1) of the Act, 21 U.S.C. section 360bbb-3(b)(1), unless the authorization is terminated or revoked.  Performed at [facility], [address] [place], [place] [address]      Labs: Basic Metabolic Panel: Recent Labs  Lab 02/15/21 [id number] 02/16/21 5329 02/17/21 9242  02/18/21 0441 02/19/21 0450  NA 138 137 138 138 138  K 3.4* 3.8 3.4* 3.7 3.7  CL 91* 89* 95* 97* 102  CO2 30 33* 34* 32 29  GLUCOSE 110* 143* 99 104* 119*  BUN 49* 39* 31* 27* 25*  CREATININE 3.40* 3.02* 2.90* 2.75* 2.76*  CALCIUM 6.3* 7.0* 7.3* 7.4* 7.6*  MG 1.3* 2.1 1.9  --   --   PHOS  --  4.5  --   --   --    Liver Function Tests: No results for input(s): AST, ALT, ALKPHOS, BILITOT, PROT, ALBUMIN in the last 168 hours. No results for input(s): LIPASE, AMYLASE in the last 168 hours. No results for input(s): AMMONIA in the last 168 hours. CBC: Recent Labs  Lab 02/15/21 0911 02/18/21 0441  WBC 5.2 4.7  HGB 8.5* 9.2*  HCT 24.7* 26.9*  MCV 88.5 90.3  PLT 154 139*   Cardiac Enzymes: No results for input(s): CKTOTAL, CKMB, CKMBINDEX, TROPONINI in the last 168 hours. BNP: BNP (last 3 results) No results for input(s): BNP in the last 8760 hours.  ProBNP (last 3 results) No results for input(s): PROBNP in the last 8760 hours.  CBG: Recent Labs  Lab 02/18/21 1156 02/18/21 1745 02/18/21 2005  02/19/21 0746 02/19/21 1156  GLUCAP 169* 120* 140* 102* 136*       Signed:  Annita Brod, MD [facility] 02/19/2021, 3:41 PM

## 2021-02-22 ENCOUNTER — Telehealth: Payer: Self-pay

## 2021-02-22 NOTE — Telephone Encounter (Signed)
Copied from [place]. Topic: General - Other >> Feb 22, 2021  9:22 AM Yvette Rack wrote: Reason for CRM: Sharee Pimple with [facility] stated they have not been able to reach the patient but they will continue to try to reach him and get him scheduled for tomorrow if this is ok with [name]. Cb# [phone] option # 2

## 2021-02-23 ENCOUNTER — Telehealth: Payer: Self-pay | Admitting: Family Medicine

## 2021-02-23 NOTE — Telephone Encounter (Signed)
Home Health Verbal Orders - Caller/Agency: [facility] Number: [phone]  Requesting : PT & Nursing Evual(urinay issue, diabetic teaching) & social work order(community resources) & Occupational therapy  w/ ADLS    Frequency: (PT) 1w1, 2w3

## 2021-02-23 NOTE — Progress Notes (Signed)
02/24/2021 2:49 PM   Scott Woods 1951-07-08 [id number]  Referring provider: [facility], [place],  [place] [address]  Chief Complaint  Patient presents with  . Urinary Retention   Urological history: 1. Elevated PSA -PSA trend  9.5 in 2015 - bx negative  8.6 on 10/10/16  9.2 on 11/02/2016 - started on finasteride   5.1 (10.2) on 05/24/2017  5.7 (11.4) on 11/27/2017  4.9 (9.8) on 05/31/2018  5.0 (10.0) on 01/08/2019   4.1 (8.2) on 07/16/2019  4.3 (8.6) on 01/17/2020  4.1 (8.2) in 06/2020   6.0 (12.) in 12/2020  2. High risk hematuria -non-smoker -CTU 2018 Stable left adrenal gland nodule measuring less than 10 Hounsfield units and consistent with a benign adenoma.  The right adrenal gland is normal.  No renal, ureteral or bladder calculi.   After contrast administration both kidneys demonstrate normal enhancement/ perfusion. No worrisome renal lesions. The delayed images do not demonstrate any significant collecting system abnormalities. Both ureters are normal.  Mild diffuse bladder wall thickening, trabeculation and cellules suggesting partial bladder outlet obstruction. Prostate gland is enlarged with median lobe hypertrophy impressing on the base of bladder. No bladder mass.  Enlarged prostate gland. The seminal vesicles are normal. -cysto 2018 Enlarged prostate Visually obstructive, 6 cm in length, and hypervascular.  Mild trabeculation. One small diverticulum noted in posterior wall of bladder  3. BPH with LU TS -I PSS 3/1 -managed with finasteride 5 mg daily  4. Adrenal adenoma -has been referred to endocrinology several times, but patient has not scheduled an appointment  HPI: GRACESON NICHELSON is a 70 y.o. male who presents today a TOV.  He was recently hospitalized for AKI secondary to urinary retention.   Catheter Removal  Patient is present today for a catheter removal.  9 ml of water was drained from the  balloon. A 16 FR foley cath was removed from the bladder no complications were noted . Patient tolerated well.  Performed by: Myself   PVR materials 149 mL.  Patient states he has been drinking fluid and voiding well all day.  PMH: Past Medical History:  Diagnosis Date  . Adrenal nodule ([place])   . Blood in the urine 05/08/2015  . Borderline diabetes   . Chronic kidney disease   . CKD (chronic kidney disease), stage II   . Diabetes mellitus without complication ([place])   . Elevated PSA   . Gross hematuria   . H/O type B viral hepatitis 05/08/2015   When he was a child   . History of hepatitis B   . HTN (hypertension)   . Hyperlipidemia with target LDL less than 100   . Obesity   . Pre-ulcerative calluses 03/28/2016   great toe   . Prostate cancer [facility])     Surgical History: Past Surgical History:  Procedure Laterality Date  . ADENOIDECTOMY    . COLONOSCOPY  11/18/2014  . PROCTOSCOPY  04/2014  . TONSILLECTOMY      Home Medications:  Allergies as of 02/24/2021   No Known Allergies     Medication List       Accurate as of February 24, 2021  2:49 PM. If you have any questions, ask your nurse or doctor.        atorvastatin 40 MG tablet Commonly known as: LIPITOR TAKE 1 TABLET BY MOUTH EVERYDAY AT BEDTIME What changed:   how much to take  how to take this  when  to take this   finasteride 5 MG tablet Commonly known as: PROSCAR TAKE 1 TABLET BY MOUTH EVERY DAY   metFORMIN 500 MG tablet Commonly known as: GLUCOPHAGE Take 2 tablets twice daily with meals   tamsulosin 0.4 MG Caps capsule Commonly known as: FLOMAX Take 1 capsule (0.4 mg total) by mouth daily after supper.       Allergies: No Known Allergies  Family History: Family History  Problem Relation Age of Onset  . Parkinson's disease Brother   . Heart disease Mother   . Diabetes Mellitus II Mother   . Coronary artery disease Mother   . Heart disease Father   . Prostate cancer Neg Hx   . Kidney  cancer Neg Hx   . Bladder Cancer Neg Hx     Social History:  reports that he has never smoked. He has never used smokeless tobacco. He reports that he does not drink alcohol and does not use drugs.  ROS: For pertinent review of systems please refer to history of present illness  Physical Exam: Constitutional:  Well nourished. Alert and oriented, No acute distress. HEENT: [place] AT, mask in place.  Trachea midline Cardiovascular: No clubbing, cyanosis, or edema. Respiratory: Normal respiratory effort, no increased work of breathing. Neurologic: Grossly intact, no focal deficits, moving all 4 extremities. Psychiatric: Normal mood and affect.   Laboratory Data: Lab Results  Component Value Date   WBC 4.7 02/18/2021   HGB 9.2 (L) 02/18/2021   HCT 26.9 (L) 02/18/2021   MCV 90.3 02/18/2021   PLT 139 (L) 02/18/2021    Lab Results  Component Value Date   CREATININE 2.76 (H) 02/19/2021    Lab Results  Component Value Date   HGBA1C 6.4 (A) 02/11/2021       Component Value Date/Time   CHOL 136 02/11/2021 1057   HDL 38 (L) 02/11/2021 1057   CHOLHDL 3.6 02/11/2021 1057   VLDL 35 02/11/2021 1057   LDLCALC 63 02/11/2021 1057   LDLCALC 61 10/02/2020 0954    Lab Results  Component Value Date   AST 13 (L) 02/11/2021   Lab Results  Component Value Date   ALT 10 02/11/2021  I reviewed the labs  Pertinent imaging: Results for ROJELIO, UHRICH" (MRN [id number]) as of 02/24/2021 14:46  Ref. Range 02/24/2021 14:34  Scan Result Unknown 150m:    Assessment & Plan:    1. Acute urinary retention -resolved at this time -Advised to contact us or seek treatment in the ED thinks she had difficulty urinating  2. Elevated PSA -patient prostate biopsy was postponed due to episode of AKI and urinary retention -He will be going to [place] to visit with his son as he is in the [employer] and is out on leave at this time he will call back in July when he returns to get his biopsy  rescheduled  3. History of hematuria -Hematuria workup in 2015 and 2018 - no worrisome lesions discovered -episode of gross hematuria in 04/2020-was encouraged to repeat hematuria work up but declined -he still declines today - he was more concerned and upset regarding his 30 hour wait in the waiting room that day   4. BPH with LUTS -symptoms stable -continue conservative management, avoiding bladder irritants and timed voiding's -continue finasteride 5 mg daily and tamsulosin 0.4 mg daily  5. Adrenal adenoma -Patient has not yet seen endocrinology -discussed that the weight loss may also be the results of metabolically active adrenal adenomas and encouraged him to  see endocrinology -he still declines     Return for in July .  These notes generated with voice recognition software. I apologize for typographical errors.  Zara Council, PA-C  [facility] [address] [place], [place] [address] [phone]  I spent 30 minutes on the day of the encounter to include pre-visit record review, face-to-face time with the patient, and post-visit ordering of tests.

## 2021-02-23 NOTE — Telephone Encounter (Signed)
Glory Buff at [facility] notified

## 2021-02-23 NOTE — Telephone Encounter (Signed)
Id this ok to give verbal

## 2021-02-24 ENCOUNTER — Ambulatory Visit (INDEPENDENT_AMBULATORY_CARE_PROVIDER_SITE_OTHER): Payer: Medicare (Managed Care) | Admitting: Urology

## 2021-02-24 ENCOUNTER — Ambulatory Visit: Payer: Self-pay | Admitting: Urology

## 2021-02-24 ENCOUNTER — Encounter: Payer: Self-pay | Admitting: Urology

## 2021-02-24 ENCOUNTER — Other Ambulatory Visit: Payer: Self-pay

## 2021-02-24 DIAGNOSIS — R338 Other retention of urine: Secondary | ICD-10-CM

## 2021-02-24 DIAGNOSIS — R972 Elevated prostate specific antigen [PSA]: Secondary | ICD-10-CM

## 2021-02-24 LAB — BLADDER SCAN AMB NON-IMAGING

## 2021-02-24 NOTE — Patient Instructions (Signed)
Please call us when you get back from your vacation in July so that we can set up an appointment and get you prepared and reschedule for your prostate biopsy.  It is important that you have this appointment as your PSA was found to be elevated and we need to conduct a biopsy to ensure the elevation is not due to prostate cancer.

## 2021-02-26 ENCOUNTER — Telehealth: Payer: Self-pay

## 2021-03-01 NOTE — Telephone Encounter (Signed)
error 

## 2021-03-02 ENCOUNTER — Telehealth: Payer: Self-pay

## 2021-03-02 NOTE — Telephone Encounter (Signed)
Copied from [place] [phone]. Topic: General - Other >> Mar 02, 2021  4:00 PM Bayard Beaver wrote: Reason for CRM: T[facility] from [facility], called to state patient doesn't want them back to home for Physical Therapy anymore.

## 2021-03-03 ENCOUNTER — Other Ambulatory Visit: Payer: Self-pay | Admitting: Urology

## 2021-03-09 NOTE — Telephone Encounter (Signed)
Colletta Maryland with [facility] is calling in to request verbal orders for OT   Frequency: 1 week 1 and 2 week 2   CB:  [phone]

## 2021-03-10 NOTE — Telephone Encounter (Signed)
Verbal orders given  

## 2021-03-24 ENCOUNTER — Other Ambulatory Visit: Payer: Self-pay | Admitting: Family Medicine

## 2021-03-24 DIAGNOSIS — R809 Proteinuria, unspecified: Secondary | ICD-10-CM

## 2021-03-31 ENCOUNTER — Telehealth: Payer: Self-pay | Admitting: Urology

## 2021-03-31 NOTE — Telephone Encounter (Signed)
Spoke with patient and advised results Appt scheduled 

## 2021-03-31 NOTE — Progress Notes (Signed)
04/01/2021 3:29 PM   Scott Woods [birth year] [id number]  Referring provider: [facility], [place],  [place] [address]  Chief Complaint  Patient presents with   Elevated PSA    Urological history: 1. Elevated PSA -PSA trend  9.5 in 2015 - bx negative  8.6 on 10/10/16  9.2 on 11/02/2016 - started on finasteride   5.1 (10.2) on 05/24/2017  5.7 (11.4) on 11/27/2017  4.9 (9.8) on 05/31/2018  5.0 (10.0) on 01/08/2019   4.1 (8.2) on 07/16/2019  4.3 (8.6) on 01/17/2020  4.1 (8.2) in 06/2020   6.0 (12.) in 12/2020  2. High risk hematuria -non-smoker -CTU 2018 Stable left adrenal gland nodule measuring less than 10 Hounsfield units and consistent with a benign adenoma.  The right adrenal gland is normal.  No renal, ureteral or bladder calculi.    After contrast administration both kidneys demonstrate normal enhancement/ perfusion. No worrisome renal lesions. The delayed images do not demonstrate any significant collecting system abnormalities. Both ureters are normal.  Mild diffuse bladder wall thickening, trabeculation and cellules suggesting partial bladder outlet obstruction. Prostate gland is enlarged with median lobe hypertrophy impressing on the base of bladder. No bladder mass.  Enlarged prostate gland. The seminal vesicles are normal. -cysto 2018 Enlarged prostate Visually obstructive, 6 cm in length, and hypervascular.  Mild trabeculation. One small diverticulum noted in posterior wall of bladder  3. BPH with LU TS -I PSS 3/1 -managed with finasteride 5 mg daily  4. Adrenal adenoma -has been referred to endocrinology several times, but patient has not scheduled an appointment  HPI: Scott Woods is a 70 y.o. male who presents today to discuss undergoing prostate biopsy.  He states he does not want undergo the prostate biopsy at this time as he states he feels fine.  PMH: Past Medical History:  Diagnosis Date    Adrenal nodule ([place])    Blood in the urine 05/08/2015   Borderline diabetes    Chronic kidney disease    CKD (chronic kidney disease), stage II    Diabetes mellitus without complication (HCC)    Elevated PSA    Gross hematuria    H/O type B viral hepatitis 05/08/2015   When he was a child    History of hepatitis B    HTN (hypertension)    Hyperlipidemia with target LDL less than 100    Obesity    Pre-ulcerative calluses 03/28/2016   great toe    Prostate cancer [facility])     Surgical History: Past Surgical History:  Procedure Laterality Date   ADENOIDECTOMY     COLONOSCOPY  11/18/2014   PROCTOSCOPY  04/2014   TONSILLECTOMY      Home Medications:  Allergies as of 04/01/2021   No Known Allergies      Medication List        Accurate as of April 01, 2021  3:29 PM. If you have any questions, ask your nurse or doctor.          atorvastatin 40 MG tablet Commonly known as: LIPITOR TAKE 1 TABLET BY MOUTH EVERYDAY AT BEDTIME What changed:  how much to take how to take this when to take this   finasteride 5 MG tablet Commonly known as: PROSCAR TAKE 1 TABLET BY MOUTH EVERY DAY   metFORMIN 500 MG tablet Commonly known as: GLUCOPHAGE Take 2 tablets twice daily with meals   tamsulosin 0.4 MG Caps capsule Commonly known as: FLOMAX Take  1 capsule (0.4 mg total) by mouth daily after supper.        Allergies: No Known Allergies  Family History: Family History  Problem Relation Age of Onset   Parkinson's disease Brother    Heart disease Mother    Diabetes Mellitus II Mother    Coronary artery disease Mother    Heart disease Father    Prostate cancer Neg Hx    Kidney cancer Neg Hx    Bladder Cancer Neg Hx     Social History:  reports that he has never smoked. He has never used smokeless tobacco. He reports that he does not drink alcohol and does not use drugs.  ROS: For pertinent review of systems please refer to history of present illness  Physical  Exam: Blood pressure 108/65, pulse 98, height 5\' 10"  (1.778 m), weight 150 lb (68 kg).  Constitutional:  Well nourished. Alert and oriented, No acute distress. HEENT: [place] AT, moist mucus membranes.  Trachea midline Cardiovascular: No clubbing, cyanosis, or edema. Respiratory: Normal respiratory effort, no increased work of breathing. Neurologic: Grossly intact, no focal deficits, moving all 4 extremities. Psychiatric: Normal mood and affect.   Laboratory Data: Urinalysis Component     Latest Ref Rng & Units 04/01/2021  Specific Gravity, UA     1.005 - 1.030 1.020  pH, UA     5.0 - 7.5 5.0  Color, UA     Yellow Yellow  Appearance Ur     Clear Clear  Leukocytes,UA     Negative Negative  Protein,UA     Negative/Trace 1+ (A)  Glucose, UA     Negative Trace (A)  Ketones, UA     Negative Negative  RBC, UA     Negative Negative  Bilirubin, UA     Negative Negative  Urobilinogen, Ur     0.2 - 1.0 mg/dL 0.2  Nitrite, UA     Negative Negative  Microscopic Examination      See below:   Component     Latest Ref Rng & Units 04/01/2021  WBC, UA     0 - 5 /hpf 0-5  RBC     0 - 2 /hpf None seen  Epithelial Cells (non renal)     0 - 10 /hpf 0-10  Bacteria, UA     None seen/Few None seen    I reviewed the labs  Pertinent imaging: Results for LYNARD, POSTLEWAIT" (MRN [id number]) as of 04/01/2021 15:07  Ref. Range 04/01/2021 14:05  Scan Result Unknown 94     Assessment & Plan:    1. Elevated PSA -explained that even though he "feels fine" he may still have prostate cancer and that his best bet for diagnosis, treatment and possibly curing him of prostate cancer is early diagnosis and that waiting until he feels bad to undergo a prostate biopsy may delay treatment of the prostate cancer to when it is it is most advanced stages and had a metastasized making treatment and cure more difficult -Repeat the PSA again today, but likely will still be elevated and we will need to  proceed with a prostate biopsy -I reviewed how the biopsy is performed advised him not to take any aspirin or blood thinning products prior to his appointment    Return for TRUSBx of prostate .  These notes generated with voice recognition software. I apologize for typographical errors.  Zara Council, PA-C  [place] [address] [place],  [address] [phone]  

## 2021-03-31 NOTE — Telephone Encounter (Signed)
Patient has returned home from visiting his son and was told to call the office to talk to [facility].  I reviewed the last note and advised patient that Larene Beach would like for him to schedule a prostate biopsy due to his elevated PSA = 6.0.  Patient states that he is feeling fine and does not want to schedule a biopsy at this time.  He wants to know if he can just follow up with Larene Beach and talk to her.  Please contact patient to provide information about the biopsy and Shannon's recommendation.

## 2021-03-31 NOTE — Telephone Encounter (Signed)
Zara Council A, PA-C  You 1 minute ago (4:25 PM)     My recommendation still stands as he has an elevated PSA and he needs a prostate biopsy for further evaluation of this.  If he desires an office visit to review how a prostate biopsy is performed, the preparation for the prostate biopsy and we can make sure he has recovered from his retention issues, that I will certainly see him in the office prior to the biopsy.

## 2021-04-01 ENCOUNTER — Ambulatory Visit (INDEPENDENT_AMBULATORY_CARE_PROVIDER_SITE_OTHER): Payer: Medicare (Managed Care) | Admitting: Urology

## 2021-04-01 ENCOUNTER — Ambulatory Visit: Payer: Medicare (Managed Care) | Admitting: Family Medicine

## 2021-04-01 ENCOUNTER — Other Ambulatory Visit: Payer: Self-pay

## 2021-04-01 ENCOUNTER — Encounter: Payer: Self-pay | Admitting: Urology

## 2021-04-01 VITALS — BP 108/65 | HR 98 | Ht 70.0 in | Wt 150.0 lb

## 2021-04-01 DIAGNOSIS — R972 Elevated prostate specific antigen [PSA]: Secondary | ICD-10-CM | POA: Diagnosis not present

## 2021-04-01 LAB — MICROSCOPIC EXAMINATION
Bacteria, UA: NONE SEEN
RBC, Urine: NONE SEEN /hpf (ref 0–2)

## 2021-04-01 LAB — URINALYSIS, COMPLETE
Bilirubin, UA: NEGATIVE
Ketones, UA: NEGATIVE
Leukocytes,UA: NEGATIVE
Nitrite, UA: NEGATIVE
RBC, UA: NEGATIVE
Specific Gravity, UA: 1.02 (ref 1.005–1.030)
Urobilinogen, Ur: 0.2 mg/dL (ref 0.2–1.0)
pH, UA: 5 (ref 5.0–7.5)

## 2021-04-01 LAB — BLADDER SCAN AMB NON-IMAGING: Scan Result: 94

## 2021-04-01 MED ORDER — TAMSULOSIN HCL 0.4 MG PO CAPS: 0.4000 mg | ORAL_CAPSULE | Freq: Every day | ORAL | 3 refills | Status: AC

## 2021-04-01 NOTE — Patient Instructions (Signed)

## 2021-04-02 LAB — PSA: Prostate Specific Ag, Serum: 5.3 ng/mL — ABNORMAL HIGH (ref 0.0–4.0)

## 2021-04-04 LAB — CULTURE, URINE COMPREHENSIVE

## 2021-04-06 ENCOUNTER — Telehealth: Payer: Self-pay

## 2021-04-06 NOTE — Telephone Encounter (Signed)
-----   Message from Debroah Loop, [place] sent at 04/05/2021  4:56 PM EDT ----- PSA has declined slightly, however it remains significant elevated over his values from 1 year ago, which is concerning for prostate cancer. I strongly recommend he continue with plans for prostate biopsy next week despite feeling well, as feeling ill is not an early sign of prostate cancer but elevated PSA is. ----- Message ----- From: Interface, [facility] Lab Results In Sent: 04/01/2021   4:37 PM EDT To: Nori Riis, PA-C

## 2021-04-06 NOTE — Telephone Encounter (Signed)
Notified patient as advised, patient verbalized understanding and confirmed his biopsy appt date, time and instructions.

## 2021-04-15 ENCOUNTER — Ambulatory Visit (INDEPENDENT_AMBULATORY_CARE_PROVIDER_SITE_OTHER): Payer: Medicare (Managed Care) | Admitting: Urology

## 2021-04-15 ENCOUNTER — Telehealth: Payer: Self-pay

## 2021-04-15 ENCOUNTER — Encounter: Payer: Self-pay | Admitting: Urology

## 2021-04-15 ENCOUNTER — Other Ambulatory Visit: Payer: Self-pay

## 2021-04-15 VITALS — BP 144/78 | HR 112 | Ht 70.0 in | Wt 154.0 lb

## 2021-04-15 DIAGNOSIS — R972 Elevated prostate specific antigen [PSA]: Secondary | ICD-10-CM | POA: Diagnosis not present

## 2021-04-15 MED ORDER — GENTAMICIN SULFATE 40 MG/ML IJ SOLN
80.0000 mg | Freq: Once | INTRAMUSCULAR | Status: AC
  Administered 2021-04-15: 80 mg via INTRAMUSCULAR

## 2021-04-15 MED ORDER — LEVOFLOXACIN 500 MG PO TABS
500.0000 mg | ORAL_TABLET | Freq: Once | ORAL | Status: AC
  Administered 2021-04-15: 500 mg via ORAL

## 2021-04-15 NOTE — Progress Notes (Signed)
   04/15/21  CC:  Chief Complaint  Patient presents with   Prostate Biopsy    HPI: 70 year old male with a personal history of elevated/rising PSA on finasteride who presents here for prostate biopsy.  Please see previous notes for details.  NED. A&Ox3.   No respiratory distress   Abd soft, NT, ND Normal sphincter tone  Prostate Biopsy Procedure   Informed consent was obtained after discussing risks/benefits of the procedure.  A time out was performed to ensure correct patient identity.  Pre-Procedure: - Gentamicin given prophylactically - Levaquin 500 mg administered PO -Transrectal Ultrasound performed revealing a 72 gm prostate -No significant hypoechoic or median lobe noted  Procedure: - Prostate block performed using 10 cc 1% lidocaine and biopsies taken from sextant areas, a total of 12 under ultrasound guidance.  Post-Procedure: - Patient tolerated the procedure well - He was counseled to seek immediate medical attention if experiences any severe pain, significant bleeding, or fevers - Return in one week to discuss biopsy results    Hollice Espy, MD

## 2021-04-15 NOTE — Telephone Encounter (Signed)
Incoming call from pt on triage line who states he does not feel he can administer his enema on his own prior to his biopsy. Advised pt that biopsy will not be performed if he is unable to complete fleets enema. Advised pt to lie on one side, raise the opposite knee and bend to help allow for insertion of enema. Pt states he will try this. There is no one in the home that can assist. Advised pt to call back if unable to complete enema. Pt voiced understanding.

## 2021-04-15 NOTE — Patient Instructions (Signed)
Transrectal Ultrasound-Guided Prostate Biopsy, Care After This sheet gives you information about how to care for yourself after your procedure. Your doctor may also give you more specific instructions. If youhave problems or questions, contact your doctor. What can I expect after the procedure? After the procedure, it is common to have: Pain and discomfort in your butt, especially while sitting. Pink-colored pee (urine), due to small amounts of blood in the pee. Burning while peeing (urinating). Blood in your poop (stool). Bleeding from your butt. Blood in your semen. Follow these instructions at home: Medicines Take over-the-counter and prescription medicines only as told by your doctor. If you were prescribed antibiotic medicine, take it as told by your doctor. Do not stop taking the antibiotic even if you start to feel better. Activity  Do not drive for 24 hours if you were given a medicine to help you relax (sedative) during your procedure. Return to your normal activities as told by your doctor. Ask your doctor what activities are safe for you. Ask your doctor when it is okay for you to have sex. Do not lift anything that is heavier than 10 lb (4.5 kg), or the limit that you are told, until your doctor says that it is safe.  General instructions  Drink enough water to keep your pee pale yellow. Watch your pee, poop, and semen for new bleeding or bleeding that gets worse. Keep all follow-up visits as told by your doctor. This is important.  Contact a doctor if you: Have blood clots in your pee or poop. Notice that your pee smells bad or unusual. Have very bad belly pain. Have trouble peeing. Notice that your lower belly feels firm. Have blood in your pee for more than 2 weeks after the procedure. Have blood in your semen for more than 2 months after the procedure. Have problems getting an erection. Feel sick to your stomach (nauseous) or throw up (vomit). Have new or worse  bleeding in your pee, poop, or semen. Get help right away if you: Have a fever or chills. Have bright red pee. Have very bad pain that does not get better with medicine. Cannot pee. Summary After this procedure, it is common to have pain and discomfort around your butt, especially while sitting. You may have blood in your pee and poop. It is common to have blood in your semen for 1-2 months. If you were prescribed antibiotic medicine, take it as told by your doctor. Do not stop taking the antibiotic even if you start to feel better. Get help right away if you have a fever or chills. This information is not intended to replace advice given to you by your health care provider. Make sure you discuss any questions you have with your healthcare provider. Document Revised: 07/20/2020 Document Reviewed: 05/21/2020 Elsevier Patient Education  2022 Elsevier Inc.  

## 2021-04-15 NOTE — Telephone Encounter (Signed)
Sw patient, patient states he was able to do the enema and has also DC his ASA.

## 2021-04-15 NOTE — Telephone Encounter (Signed)
Pt LM on triage line stating that he cannot complete the Fleets enema without help. He states he is not a doctor or nurse and does not know how he is supposed to do this.

## 2021-04-18 NOTE — Progress Notes (Shared)
04/20/2021 1:42 PM   Scott Woods March 11, 1951 [id number]  Referring provider: [facility], [place],  [place] [address] No chief complaint on file.   HPI: Scott Woods is a 70 y.o. male who presents today to discuss biopsy results that was performed 04/15/2021.  Patient has a personal history of elevated/rising PSA on finasteride, high risk hematuria, and BPH with LUTS. Patients elevated/rising PSA is the reasoning for biopsy.   PSA on 04/01/2021 was 5.3.      PMH: Past Medical History:  Diagnosis Date   Adrenal nodule ([place])    Blood in the urine 05/08/2015   Borderline diabetes    Chronic kidney disease    CKD (chronic kidney disease), stage II    Diabetes mellitus without complication (HCC)    Elevated PSA    Gross hematuria    H/O type B viral hepatitis 05/08/2015   When he was a child    History of hepatitis B    HTN (hypertension)    Hyperlipidemia with target LDL less than 100    Obesity    Pre-ulcerative calluses 03/28/2016   great toe    Prostate cancer [facility])     Surgical History: Past Surgical History:  Procedure Laterality Date   ADENOIDECTOMY     COLONOSCOPY  11/18/2014   PROCTOSCOPY  04/2014   TONSILLECTOMY      Home Medications:  Allergies as of 04/20/2021   No Known Allergies      Medication List        Accurate as of April 18, 2021  1:42 PM. If you have any questions, ask your nurse or doctor.          atorvastatin 40 MG tablet Commonly known as: LIPITOR TAKE 1 TABLET BY MOUTH EVERYDAY AT BEDTIME What changed:  how much to take how to take this when to take this   finasteride 5 MG tablet Commonly known as: PROSCAR TAKE 1 TABLET BY MOUTH EVERY DAY   lisinopril 10 MG tablet Commonly known as: ZESTRIL Take 10 mg by mouth daily.   metFORMIN 500 MG tablet Commonly known as: GLUCOPHAGE Take 2 tablets twice daily with meals   tamsulosin 0.4 MG Caps capsule Commonly known as:  FLOMAX Take 1 capsule (0.4 mg total) by mouth daily after supper.        Allergies: No Known Allergies  Family History: Family History  Problem Relation Age of Onset   Parkinson's disease Brother    Heart disease Mother    Diabetes Mellitus II Mother    Coronary artery disease Mother    Heart disease Father    Prostate cancer Neg Hx    Kidney cancer Neg Hx    Bladder Cancer Neg Hx     Social History:  reports that he has never smoked. He has never used smokeless tobacco. He reports that he does not drink alcohol and does not use drugs.   Physical Exam: There were no vitals taken for this visit.  Constitutional:  Alert and oriented, No acute distress. HEENT: [place] AT, moist mucus membranes.  Trachea midline, no masses. Cardiovascular: No clubbing, cyanosis, or edema. Respiratory: Normal respiratory effort, no increased work of breathing. GI: Abdomen is soft, nontender, nondistended, no abdominal masses GU: No CVA tenderness Lymph: No cervical or inguinal lymphadenopathy. Skin: No rashes, bruises or suspicious lesions. Neurologic: Grossly intact, no focal deficits, moving all 4 extremities. Psychiatric: Normal mood and affect.  Laboratory Data:  Lab  Results  Component Value Date   CREATININE 2.76 (H) 02/19/2021    Lab Results  Component Value Date   PSA 8.6 (H) 10/10/2016    No results found for: TESTOSTERONE  Lab Results  Component Value Date   HGBA1C 6.4 (A) 02/11/2021    Urinalysis   Pertinent Imaging: ***    Assessment & Plan:     No follow-ups on file.  I,Kailey Littlejohn,acting as a Education administrator for Hollice Espy, MD.,have documented all relevant documentation on the behalf of Hollice Espy, MD,as directed by  Hollice Espy, MD while in the presence of Hollice Espy, [place] [address] [place], [place] [address] [phone]

## 2021-04-20 ENCOUNTER — Ambulatory Visit: Payer: Self-pay | Admitting: Urology

## 2021-04-20 LAB — SURGICAL PATHOLOGY

## 2021-04-20 NOTE — Progress Notes (Signed)
04/21/2021 3:36 PM   Scott Woods Apr 09, 1951 [id number]  Referring provider: [facility], Scott Woods,  Scott Woods Chief Complaint  Patient presents with   Results    HPI: Scott Woods is a 70 y.o. male with a personal history of elevated/rising PSA on finasteride who presents today for biopsy results.   Biopsy was benign atypical malignant. Non-specific granulomatous prostatitis characterized by a mixed inflammatory infiltrate around prostatic ducts.   PSA Trend:   Component     Latest Ref Rng & Units 07/14/2020 01/18/2021 04/01/2021  Prostate Specific Ag, Serum     0.0 - 4.0 ng/mL 4.1 (H) 6.0 (H) 5.3 (H)      PMH: Past Medical History:  Diagnosis Date   Adrenal nodule ([place])    Blood in the urine 05/08/2015   Borderline diabetes    Chronic kidney disease    CKD (chronic kidney disease), stage II    Diabetes mellitus without complication (HCC)    Elevated PSA    Gross hematuria    H/O type B viral hepatitis 05/08/2015   When he was a child    History of hepatitis B    HTN (hypertension)    Hyperlipidemia with target LDL less than 100    Obesity    Pre-ulcerative calluses 03/28/2016   great toe    Prostate cancer [facility])     Surgical History: Past Surgical History:  Procedure Laterality Date   ADENOIDECTOMY     COLONOSCOPY  11/18/2014   PROCTOSCOPY  04/2014   TONSILLECTOMY      Home Medications:  Allergies as of 04/21/2021   No Known Allergies      Medication List        Accurate as of April 21, 2021  3:36 PM. If you have any questions, ask your nurse or doctor.          atorvastatin 40 MG tablet Commonly known as: LIPITOR TAKE 1 TABLET BY MOUTH EVERYDAY AT BEDTIME What changed:  how much to take how to take this when to take this   finasteride 5 MG tablet Commonly known as: PROSCAR TAKE 1 TABLET BY MOUTH EVERY DAY   lisinopril 10 MG tablet Commonly known as: ZESTRIL Take 10 mg by mouth  daily.   metFORMIN 500 MG tablet Commonly known as: GLUCOPHAGE Take 2 tablets twice daily with meals   tamsulosin 0.4 MG Caps capsule Commonly known as: FLOMAX Take 1 capsule (0.4 mg total) by mouth daily after supper.        Allergies: No Known Allergies  Family History: Family History  Problem Relation Age of Onset   Parkinson's disease Brother    Heart disease Mother    Diabetes Mellitus II Mother    Coronary artery disease Mother    Heart disease Father    Prostate cancer Neg Hx    Kidney cancer Neg Hx    Bladder Cancer Neg Hx     Social History:  reports that he has never smoked. He has never used smokeless tobacco. He reports that he does not drink alcohol and does not use drugs.   Physical Exam: BP 117/67   Pulse 93   Ht 5\' 10"  (1.778 m)   Wt 154 lb (69.9 kg)   BMI 22.10 kg/m   Constitutional:  Alert and oriented, No acute distress. HEENT: [place] AT, moist mucus membranes.  Trachea midline, no masses. Cardiovascular: No clubbing, cyanosis, or edema. Respiratory: Normal respiratory effort, no  increased work of breathing Skin: No rashes, bruises or suspicious lesions. Neurologic: Grossly intact, no focal deficits, moving all 4 extremities. Psychiatric: Normal mood and affect.    Assessment & Plan:    Elevated PSA -Reviewed pathology results, no evidence of malignancy -Plan to repeat PSA in 6 months -Continue finasteride  2. Prostatitis  -Pathologic finding only, asymptomatic   Follow-up with Scott Council, PA-C in 6 months for PVR/IPSS and PSA.   I,Scott Woods,acting as a Education administrator for Scott Espy, MD.,have documented all relevant documentation on the behalf of Scott Espy, MD,as directed by  Scott Espy, MD while in the presence of Scott Espy, MD.  I have reviewed the above documentation for accuracy and completeness, and I agree with the above.   Scott Espy, MD    [facility] [address],  [place], [place] [address] [phone]

## 2021-04-21 ENCOUNTER — Ambulatory Visit (INDEPENDENT_AMBULATORY_CARE_PROVIDER_SITE_OTHER): Payer: Medicare (Managed Care) | Admitting: Urology

## 2021-04-21 ENCOUNTER — Other Ambulatory Visit: Payer: Self-pay

## 2021-04-21 ENCOUNTER — Encounter: Payer: Self-pay | Admitting: Urology

## 2021-04-21 VITALS — BP 117/67 | HR 93 | Ht 70.0 in | Wt 154.0 lb

## 2021-04-21 DIAGNOSIS — R972 Elevated prostate specific antigen [PSA]: Secondary | ICD-10-CM

## 2021-04-29 ENCOUNTER — Ambulatory Visit (INDEPENDENT_AMBULATORY_CARE_PROVIDER_SITE_OTHER): Payer: Medicare (Managed Care)

## 2021-04-29 ENCOUNTER — Other Ambulatory Visit: Payer: Self-pay

## 2021-04-29 VITALS — BP 118/72 | HR 101 | Temp 97.4°F | Resp 15 | Ht 70.0 in | Wt 151.2 lb

## 2021-04-29 DIAGNOSIS — Z0000 Encounter for general adult medical examination without abnormal findings: Secondary | ICD-10-CM | POA: Diagnosis not present

## 2021-04-29 NOTE — Progress Notes (Signed)
Subjective:   Scott Woods is a 70 y.o. male who presents for an Initial Medicare Annual Wellness Visit.  Review of Systems     Cardiac Risk Factors include: advanced age (>55men, >39 women);diabetes mellitus;dyslipidemia;hypertension;male gender     Objective:    Today's Vitals   04/29/21 1555  BP: 118/72  Pulse: (!) 101  Resp: 15  Temp: (!) 97.4 F (36.3 C)  TempSrc: Oral  SpO2: 99%  Weight: 151 lb 3.2 oz (68.6 kg)  Height: 5\' 10"  (1.778 m)   Body mass index is 21.69 kg/m.  Advanced Directives 04/29/2021 02/13/2021 02/12/2021 02/11/2021 05/01/2020 10/10/2019 02/20/2017  Does Patient Have a Medical Advance Directive? Yes Yes Yes Yes No No No  Type of Advance Directive Living will;Healthcare Power of [place]  Does patient want to make changes to medical advance directive? - - No - Patient declined - - - -  Copy of [place] in Chart? No - copy requested No - copy requested No - copy requested - - - -  Would patient like information on creating a medical advance directive? - - - - - No - Patient declined -    Current Medications (verified) Outpatient Encounter Medications as of 04/29/2021  Medication Sig   atorvastatin (LIPITOR) 40 MG tablet TAKE 1 TABLET BY MOUTH EVERYDAY AT BEDTIME (Patient taking differently: Take 40 mg by mouth daily. TAKE 1 TABLET BY MOUTH EVERYDAY AT BEDTIME)   finasteride (PROSCAR) 5 MG tablet TAKE 1 TABLET BY MOUTH EVERY DAY (Patient taking differently: Take 5 mg by mouth daily.)   lisinopril (ZESTRIL) 10 MG tablet Take 10 mg by mouth daily.   metFORMIN (GLUCOPHAGE) 500 MG tablet Take 2 tablets twice daily with meals   tamsulosin (FLOMAX) 0.4 MG CAPS capsule Take 1 capsule (0.4 mg total) by mouth daily after supper.   No facility-administered encounter medications on file as of 04/29/2021.    Allergies (verified) Patient has no known allergies.   History: Past  Medical History:  Diagnosis Date   Adrenal nodule ([place])    Blood in the urine 05/08/2015   Borderline diabetes    Chronic kidney disease    CKD (chronic kidney disease), stage II    Diabetes mellitus without complication (HCC)    Elevated PSA    Gross hematuria    H/O type B viral hepatitis 05/08/2015   When he was a child    History of hepatitis B    HTN (hypertension)    Hyperlipidemia with target LDL less than 100    Obesity    Pre-ulcerative calluses 03/28/2016   great toe    Prostate cancer [facility])    Past Surgical History:  Procedure Laterality Date   ADENOIDECTOMY     COLONOSCOPY  11/18/2014   PROCTOSCOPY  04/2014   TONSILLECTOMY     Family History  Problem Relation Age of Onset   Parkinson's disease Brother    Heart disease Mother    Diabetes Mellitus II Mother    Coronary artery disease Mother    Heart disease Father    Prostate cancer Neg Hx    Kidney cancer Neg Hx    Bladder Cancer Neg Hx    Social History   Socioeconomic History   Marital status: Divorced    Spouse name: Not on file   Number of children: 2   Years of education: Not on file   Highest education  level: Not on file  Occupational History   Not on file  Tobacco Use   Smoking status: Never   Smokeless tobacco: Never   Tobacco comments:    Smoking cessation materials not required  Vaping Use   Vaping Use: Never used  Substance and Sexual Activity   Alcohol use: No    Alcohol/week: 0.0 standard drinks   Drug use: No   Sexual activity: Not Currently  Other Topics Concern   Not on file  Social History Narrative   Not on file   Social Determinants of Health   Financial Resource Strain: Low Risk    Difficulty of Paying Living Expenses: Not hard at all  Food Insecurity: No Food Insecurity   Worried About Charity fundraiser in the Last Year: Never true   Ran Out of Food in the Last Year: Never true  Transportation Needs: No Transportation Needs   Lack of Transportation (Medical): No    Lack of Transportation (Non-Medical): No  Physical Activity: Inactive   Days of Exercise per Week: 0 days   Minutes of Exercise per Session: 0 min  Stress: No Stress Concern Present   Feeling of Stress : Not at all  Social Connections: Moderately Isolated   Frequency of Communication with Friends and Family: More than three times a week   Frequency of Social Gatherings with Friends and Family: Once a week   Attends Religious Services: More than 4 times per year   Active Member of [employer] or Organizations: No   Attends Music therapist: Never   Marital Status: Divorced    Tobacco Counseling Counseling given: Not Answered Tobacco comments: Smoking cessation materials not required   Clinical Intake:  Pre-visit preparation completed: Yes  Pain : No/denies pain     BMI - recorded: 21.69 Nutritional Status: BMI of 19-24  Normal Nutritional Risks: None Diabetes: Yes CBG done?: No Did pt. bring in CBG monitor from home?: No  How often do you need to have someone help you when you read instructions, pamphlets, or other written materials from your doctor or pharmacy?: 1 - Never Nutrition Risk Assessment:  Has the patient had any N/V/D within the last 2 months?  No  Does the patient have any non-healing wounds?  No  Has the patient had any unintentional weight loss or weight gain?  No   Diabetes:  Is the patient diabetic?  Yes  If diabetic, was a CBG obtained today?  No  Did the patient bring in their glucometer from home?  No  How often do you monitor your CBG's? Patient does not actively check blood sugar.   Financial Strains and Diabetes Management:  Are you having any financial strains with the device, your supplies or your medication? No .  Does the patient want to be seen by Chronic Care Management for management of their diabetes?  No  Would the patient like to be referred to a Nutritionist or for Diabetic Management?  No   Diabetic Exams:  Diabetic  Eye Exam: Completed 12/04/19 negative reitnopathy. Overdue for diabetic eye exam. Pt has been advised about the importance in completing this exam.   Diabetic Foot Exam: Completed 10/02/20.   Interpreter Needed?: No  Information entered by :: Clemetine Marker LPN   Activities of Daily Living In your present state of health, do you have any difficulty performing the following activities: 04/29/2021 02/12/2021  Hearing? N N  Vision? N N  Difficulty concentrating or making decisions? N N  Walking or climbing stairs? Y Y  Dressing or bathing? N N  Doing errands, shopping? N N  Preparing Food and eating ? N -  Using the Toilet? N -  In the past six months, have you accidently leaked urine? N -  Do you have problems with loss of bowel control? N -  Managing your Medications? N -  Managing your Finances? N -  Housekeeping or managing your Housekeeping? N -  Some recent data might be hidden    Patient Care Team: [facility] as PCP - General (Family Medicine) Dingeldein, Remo Lipps, MD (Ophthalmology) Hollice Espy, MD as Consulting Physician (Urology)  Indicate any recent Medical Services you may have received from other than [facility] providers in the past year (date may be approximate).     Assessment:   This is a routine wellness examination for [place].  Hearing/Vision screen Hearing Screening - Comments:: Pt denies hearing difficulty Vision Screening - Comments:: Annual vision screenings done at [facility]   Dietary issues and exercise activities discussed: Current Exercise Habits: The patient does not participate in regular exercise at present, Exercise limited by: orthopedic condition(s)   Goals Addressed   None    Depression Screen PHQ 2/9 Scores 04/29/2021 02/11/2021 10/02/2020 04/01/2020 10/03/2019 04/01/2019 09/28/2018  PHQ - 2 Score 0 0 1 0 0 0 0  PHQ- 9 Score - 0 - 0 0 0 0    Fall Risk Fall Risk  04/29/2021 02/11/2021 10/02/2020 04/01/2020 10/03/2019  Falls  in the past year? 1 1 1  0 0  Comment - - 3 - -  Number falls in past yr: 0 1 1 0 0  Injury with Fall? 0 0 1 0 0  Risk for fall due to : Impaired balance/gait - History of fall(s) - -  Follow up Falls prevention discussed - Falls evaluation completed - Falls evaluation completed    FALL RISK PREVENTION PERTAINING TO THE HOME:  Any stairs in or around the home? Yes  If so, are there any without handrails? No  Home free of loose throw rugs in walkways, pet beds, electrical cords, etc? Yes  Adequate lighting in your home to reduce risk of falls? Yes   ASSISTIVE DEVICES UTILIZED TO PREVENT FALLS:  Life alert? No  Use of a cane, walker or w/c? Yes  Grab bars in the bathroom? Yes  Shower chair or bench in shower? No  Elevated toilet seat or a handicapped toilet? No   TIMED UP AND GO:  Was the test performed? Yes .  Length of time to ambulate 10 feet: 6 sec.   Gait steady and fast with assistive device  Cognitive Function: Normal cognitive status assessed by direct observation by this Nurse Health Advisor. No abnormalities found.          Immunizations Immunization History  Administered Date(s) Administered   Pneumococcal Conjugate-13 02/13/2018   Pneumococcal Polysaccharide-23 04/01/2019    TDAP status: Due, Education has been provided regarding the importance of this vaccine. Advised may receive this vaccine at local pharmacy or Health Dept. Aware to provide a copy of the vaccination record if obtained from local pharmacy or Health Dept. Verbalized acceptance and understanding.  Flu Vaccine status: Declined, Education has been provided regarding the importance of this vaccine but patient still declined. Advised may receive this vaccine at local pharmacy or Health Dept. Aware to provide a copy of the vaccination record if obtained from local pharmacy or Health Dept. Verbalized acceptance and understanding.  Pneumococcal vaccine status: Up to date  Covid-19 vaccine status:  Completed vaccines  Qualifies for Shingles Vaccine? Yes   Zostavax completed No   Shingrix Completed?: No.    Education has been provided regarding the importance of this vaccine. Patient has been advised to call insurance company to determine out of pocket expense if they have not yet received this vaccine. Advised may also receive vaccine at local pharmacy or Health Dept. Verbalized acceptance and understanding.  Screening Tests Health Maintenance  Topic Date Due   COVID-19 Vaccine (1) Never done   Zoster Vaccines- Shingrix (1 of 2) Never done   INFLUENZA VACCINE  04/19/2021   HEMOGLOBIN A1C  08/14/2021   FOOT EXAM  10/02/2021   OPHTHALMOLOGY EXAM  01/06/2022   TETANUS/TDAP  09/19/2022   COLONOSCOPY (Pts 45-9yrs Insurance coverage will need to be confirmed)  09/19/2024   Hepatitis C Screening  Completed   PNA vac Low Risk Adult  Completed   HPV VACCINES  Aged Out    Health Maintenance  Health Maintenance Due  Topic Date Due   COVID-19 Vaccine (1) Never done   Zoster Vaccines- Shingrix (1 of 2) Never done   INFLUENZA VACCINE  04/19/2021    Colorectal cancer screening: Type of screening: Colonoscopy. Completed 2016. Repeat every 10 years  Lung Cancer Screening: (Low Dose CT Chest recommended if Age 31-80 years, 30 pack-year currently smoking OR have quit w/in 15years.) does not qualify.   Additional Screening:  Hepatitis C Screening: does qualify; Completed 04/01/19  Vision Screening: Recommended annual ophthalmology exams for early detection of glaucoma and other disorders of the eye. Is the patient up to date with their annual eye exam?  No  Who is the provider or what is the name of the office in which the patient attends annual eye exams? [facility] .   Dental Screening: Recommended annual dental exams for proper oral hygiene  Community Resource Referral / Chronic Care Management: CRR required this visit?  No   CCM required this visit?  No      Plan:      I have personally reviewed and noted the following in the patient's chart:   Medical and social history Use of alcohol, tobacco or illicit drugs  Current medications and supplements including opioid prescriptions. Patient is not currently taking opioid prescriptions. Functional ability and status Nutritional status Physical activity Advanced directives List of other physicians Hospitalizations, surgeries, and ER visits in previous 12 months Vitals Screenings to include cognitive, depression, and falls Referrals and appointments  In addition, I have reviewed and discussed with patient certain preventive protocols, quality metrics, and best practice recommendations. A written personalized care plan for preventive services as well as general preventive health recommendations were provided to patient.     Clemetine Marker, LPN   [id number]/53/2992   Nurse Notes: pt advised due for follow up with PCP; scheduled for 05/25/21. Patient was supposed to see nephrology post hospital discharge; no records available.Marland Kitchen

## 2021-04-29 NOTE — Patient Instructions (Signed)
Scott Woods , Thank you for taking time to come for your Medicare Wellness Visit. I appreciate your ongoing commitment to your health goals. Please review the following plan we discussed and let me know if I can assist you in the future.   Screening recommendations/referrals: Colonoscopy: done 2016. Repeat in 2026 Recommended yearly ophthalmology/optometry visit for glaucoma screening and checkup Recommended yearly dental visit for hygiene and checkup  Vaccinations: Influenza vaccine: declined Pneumococcal vaccine: done 04/01/19 Tdap vaccine: due Shingles vaccine: Shingrix discussed. Please contact your pharmacy for coverage information.  Covid-19:  please bring a copy of your vaccine record to your next appointment  Advanced directives: Please bring a copy of your health care power of attorney and living will to the office at your convenience.   Conditions/risks identified: Recommend increasing physical activity as tolerated  Next appointment: Follow up in one year for your annual wellness visit.   Preventive Care 60 Years and Older, Male Preventive care refers to lifestyle choices and visits with your health care provider that can promote health and wellness. What does preventive care include? A yearly physical exam. This is also called an annual well check. Dental exams once or twice a year. Routine eye exams. Ask your health care provider how often you should have your eyes checked. Personal lifestyle choices, including: Daily care of your teeth and gums. Regular physical activity. Eating a healthy diet. Avoiding tobacco and drug use. Limiting alcohol use. Practicing safe sex. Taking low doses of aspirin every day. Taking vitamin and mineral supplements as recommended by your health care provider. What happens during an annual well check? The services and screenings done by your health care provider during your annual well check will depend on your age, overall health, lifestyle  risk factors, and family history of disease. Counseling  Your health care provider may ask you questions about your: Alcohol use. Tobacco use. Drug use. Emotional well-being. Home and relationship well-being. Sexual activity. Eating habits. History of falls. Memory and ability to understand (cognition). Work and work Statistician. Screening  You may have the following tests or measurements: Height, weight, and BMI. Blood pressure. Lipid and cholesterol levels. These may be checked every 5 years, or more frequently if you are over 36 years old. Skin check. Lung cancer screening. You may have this screening every year starting at age 91 if you have a 30-pack-year history of smoking and currently smoke or have quit within the past 15 years. Fecal occult blood test (FOBT) of the stool. You may have this test every year starting at age 72. Flexible sigmoidoscopy or colonoscopy. You may have a sigmoidoscopy every 5 years or a colonoscopy every 10 years starting at age 32. Prostate cancer screening. Recommendations will vary depending on your family history and other risks. Hepatitis C blood test. Hepatitis B blood test. Sexually transmitted disease (STD) testing. Diabetes screening. This is done by checking your blood sugar (glucose) after you have not eaten for a while (fasting). You may have this done every 1-3 years. Abdominal aortic aneurysm (AAA) screening. You may need this if you are a current or former smoker. Osteoporosis. You may be screened starting at age 61 if you are at high risk. Talk with your health care provider about your test results, treatment options, and if necessary, the need for more tests. Vaccines  Your health care provider may recommend certain vaccines, such as: Influenza vaccine. This is recommended every year. Tetanus, diphtheria, and acellular pertussis (Tdap, Td) vaccine. You may need a  Td booster every 10 years. Zoster vaccine. You may need this after age  90. Pneumococcal 13-valent conjugate (PCV13) vaccine. One dose is recommended after age 16. Pneumococcal polysaccharide (PPSV23) vaccine. One dose is recommended after age 69. Talk to your health care provider about which screenings and vaccines you need and how often you need them. This information is not intended to replace advice given to you by your health care provider. Make sure you discuss any questions you have with your health care provider. Document Released: 10/02/2015 Document Revised: 05/25/2016 Document Reviewed: 07/07/2015 Elsevier Interactive Patient Education  2017 [employer] Prevention in the Home Falls can cause injuries. They can happen to people of all ages. There are many things you can do to make your home safe and to help prevent falls. What can I do on the outside of my home? Regularly fix the edges of walkways and driveways and fix any cracks. Remove anything that might make you trip as you walk through a door, such as a raised step or threshold. Trim any bushes or trees on the path to your home. Use bright outdoor lighting. Clear any walking paths of anything that might make someone trip, such as rocks or tools. Regularly check to see if handrails are loose or broken. Make sure that both sides of any steps have handrails. Any raised decks and porches should have guardrails on the edges. Have any leaves, snow, or ice cleared regularly. Use sand or salt on walking paths during winter. Clean up any spills in your garage right away. This includes oil or grease spills. What can I do in the bathroom? Use night lights. Install grab bars by the toilet and in the tub and shower. Do not use towel bars as grab bars. Use non-skid mats or decals in the tub or shower. If you need to sit down in the shower, use a plastic, non-slip stool. Keep the floor dry. Clean up any water that spills on the floor as soon as it happens. Remove soap buildup in the tub or shower  regularly. Attach bath mats securely with double-sided non-slip rug tape. Do not have throw rugs and other things on the floor that can make you trip. What can I do in the bedroom? Use night lights. Make sure that you have a light by your bed that is easy to reach. Do not use any sheets or blankets that are too big for your bed. They should not hang down onto the floor. Have a firm chair that has side arms. You can use this for support while you get dressed. Do not have throw rugs and other things on the floor that can make you trip. What can I do in the kitchen? Clean up any spills right away. Avoid walking on wet floors. Keep items that you use a lot in easy-to-reach places. If you need to reach something above you, use a strong step stool that has a grab bar. Keep electrical cords out of the way. Do not use floor polish or wax that makes floors slippery. If you must use wax, use non-skid floor wax. Do not have throw rugs and other things on the floor that can make you trip. What can I do with my stairs? Do not leave any items on the stairs. Make sure that there are handrails on both sides of the stairs and use them. Fix handrails that are broken or loose. Make sure that handrails are as long as the stairways. Check any  carpeting to make sure that it is firmly attached to the stairs. Fix any carpet that is loose or worn. Avoid having throw rugs at the top or bottom of the stairs. If you do have throw rugs, attach them to the floor with carpet tape. Make sure that you have a light switch at the top of the stairs and the bottom of the stairs. If you do not have them, ask someone to add them for you. What else can I do to help prevent falls? Wear shoes that: Do not have high heels. Have rubber bottoms. Are comfortable and fit you well. Are closed at the toe. Do not wear sandals. If you use a stepladder: Make sure that it is fully opened. Do not climb a closed stepladder. Make sure that  both sides of the stepladder are locked into place. Ask someone to hold it for you, if possible. Clearly mark and make sure that you can see: Any grab bars or handrails. First and last steps. Where the edge of each step is. Use tools that help you move around (mobility aids) if they are needed. These include: Canes. Walkers. Scooters. Crutches. Turn on the lights when you go into a dark area. Replace any light bulbs as soon as they burn out. Set up your furniture so you have a clear path. Avoid moving your furniture around. If any of your floors are uneven, fix them. If there are any pets around you, be aware of where they are. Review your medicines with your doctor. Some medicines can make you feel dizzy. This can increase your chance of falling. Ask your doctor what other things that you can do to help prevent falls. This information is not intended to replace advice given to you by your health care provider. Make sure you discuss any questions you have with your health care provider. Document Released: 07/02/2009 Document Revised: 02/11/2016 Document Reviewed: 10/10/2014 Elsevier Interactive Patient Education  2017 [employer].

## 2021-05-07 ENCOUNTER — Other Ambulatory Visit: Payer: Self-pay | Admitting: Family Medicine

## 2021-05-07 DIAGNOSIS — E1129 Type 2 diabetes mellitus with other diabetic kidney complication: Secondary | ICD-10-CM

## 2021-05-10 NOTE — Telephone Encounter (Signed)
It doesn't look like we have seen this pt for a while.  According to labs, he should not be taking this medication at this time.

## 2021-05-25 ENCOUNTER — Ambulatory Visit: Payer: Medicare (Managed Care) | Admitting: Family Medicine

## 2021-05-25 DIAGNOSIS — R809 Proteinuria, unspecified: Secondary | ICD-10-CM

## 2021-05-25 DIAGNOSIS — N182 Chronic kidney disease, stage 2 (mild): Secondary | ICD-10-CM

## 2021-05-25 DIAGNOSIS — R634 Abnormal weight loss: Secondary | ICD-10-CM

## 2021-05-25 DIAGNOSIS — E785 Hyperlipidemia, unspecified: Secondary | ICD-10-CM

## 2021-05-25 DIAGNOSIS — I10 Essential (primary) hypertension: Secondary | ICD-10-CM

## 2021-05-27 ENCOUNTER — Ambulatory Visit: Payer: Medicare (Managed Care) | Admitting: Family Medicine

## 2021-06-03 ENCOUNTER — Other Ambulatory Visit: Payer: Self-pay

## 2021-06-03 ENCOUNTER — Encounter: Payer: Self-pay | Admitting: Unknown Physician Specialty

## 2021-06-03 ENCOUNTER — Ambulatory Visit: Payer: Medicare (Managed Care) | Admitting: Unknown Physician Specialty

## 2021-06-03 ENCOUNTER — Telehealth: Payer: Self-pay | Admitting: Family Medicine

## 2021-06-03 VITALS — BP 120/70 | HR 98 | Temp 98.2°F | Resp 16 | Ht 70.0 in | Wt 154.0 lb

## 2021-06-03 DIAGNOSIS — I10 Essential (primary) hypertension: Secondary | ICD-10-CM

## 2021-06-03 DIAGNOSIS — R918 Other nonspecific abnormal finding of lung field: Secondary | ICD-10-CM

## 2021-06-03 DIAGNOSIS — R9389 Abnormal findings on diagnostic imaging of other specified body structures: Secondary | ICD-10-CM

## 2021-06-03 DIAGNOSIS — E119 Type 2 diabetes mellitus without complications: Secondary | ICD-10-CM | POA: Diagnosis not present

## 2021-06-03 DIAGNOSIS — D3502 Benign neoplasm of left adrenal gland: Secondary | ICD-10-CM | POA: Diagnosis not present

## 2021-06-03 DIAGNOSIS — N184 Chronic kidney disease, stage 4 (severe): Secondary | ICD-10-CM | POA: Diagnosis not present

## 2021-06-03 DIAGNOSIS — E1129 Type 2 diabetes mellitus with other diabetic kidney complication: Secondary | ICD-10-CM

## 2021-06-03 LAB — POCT GLYCOSYLATED HEMOGLOBIN (HGB A1C): Hemoglobin A1C: 5.5 % (ref 4.0–5.6)

## 2021-06-03 NOTE — Assessment & Plan Note (Signed)
Re-order chest CT with contrast for f/u

## 2021-06-03 NOTE — Assessment & Plan Note (Signed)
Continue Lisinopril.  Refer to nephrology

## 2021-06-03 NOTE — Telephone Encounter (Signed)
Requested medications are due for refill today.  yes  Requested medications are on the active medications list.  yes  Last refill. 10/02/2020  Future visit scheduled.   yes  Notes to clinic.  Failed protocol d/t labs.

## 2021-06-03 NOTE — Addendum Note (Signed)
Addended by: Kathrine Haddock on: 06/03/2021 04:13 PM   Modules accepted: Orders

## 2021-06-03 NOTE — Assessment & Plan Note (Signed)
Last GFR 24.  Urgent referral to Nephrology.  Lost to f/u

## 2021-06-03 NOTE — Assessment & Plan Note (Signed)
Hgb A1C is 5.5.  Stop Metformin due to low GFR

## 2021-06-03 NOTE — Telephone Encounter (Signed)
Last seen 02/11/2021

## 2021-06-03 NOTE — Assessment & Plan Note (Signed)
Another referral to Endocrine for management

## 2021-06-03 NOTE — Progress Notes (Addendum)
BP 120/70   Pulse 98   Temp 98.2 F (36.8 C) (Oral)   Resp 16   Ht 5\' 10"  (1.778 m)   Wt 154 lb (69.9 kg)   SpO2 94%   BMI 22.10 kg/m    Subjective:    Patient ID: Scott Woods, male    DOB: 1951/01/05, 70 y.o.   MRN: [id number]  HPI: Scott Woods is a 70 y.o. male  Chief Complaint  Patient presents with   Follow-up   Last visit referred to the Er secondary to AKI.  Discharged 6/3 and lost to f/u.  Reviewing Discharge notes:   Needs a chest CT with contrast to f/u on a ? Nodule/air space disease.  CT noted patchy and nodular air space disease (incidentally pos Covid in hospital)  Nephrology to follow  Review of chart shows seeing urology for elevated PSA/hematuria.  Bx negative 8/22 Adrenal mass not following with endocrine.    ------------------------------------------------------------------------------------------ Today patient is here and not aware that he was due for a f/u here and feels he was told there "was nothing wrong with my kidneys" by the urologist who did a prostate biopsy.    Med review: Pt seems unclear about what medications he takes.  Taken off of Lisinopril in the hospital.  Unclear if he has restarted.  He is still taking Metformin despite last GFR 24 and upset he might have to stop.   Depression screen [facility] 2/9 06/03/2021 04/29/2021 02/11/2021 10/02/2020 04/01/2020  Decreased Interest 0 0 0 1 0  Down, Depressed, Hopeless 0 0 0 0 0  PHQ - 2 Score 0 0 0 1 0  Altered sleeping - - 0 - 0  Tired, decreased energy - - 0 - 0  Change in appetite - - 0 - 0  Feeling bad or failure about yourself  - - 0 - 0  Trouble concentrating - - 0 - 0  Moving slowly or fidgety/restless - - 0 - 0  Suicidal thoughts - - 0 - 0  PHQ-9 Score - - 0 - 0  Difficult doing work/chores - - Not difficult at all - -       Relevant past medical, surgical, family and social history reviewed and updated as indicated. Interim medical history since our last visit reviewed. Allergies  and medications reviewed and updated.  Review of Systems  Constitutional: Negative.   HENT: Negative.    Cardiovascular: Negative.   Genitourinary: Negative.   Neurological: Negative.    Per HPI unless specifically indicated above     Objective:    BP 120/70   Pulse 98   Temp 98.2 F (36.8 C) (Oral)   Resp 16   Ht 5\' 10"  (1.778 m)   Wt 154 lb (69.9 kg)   SpO2 94%   BMI 22.10 kg/m   Wt Readings from Last 3 Encounters:  06/03/21 154 lb (69.9 kg)  04/29/21 151 lb 3.2 oz (68.6 kg)  04/21/21 154 lb (69.9 kg)    Physical Exam Constitutional:      General: He is not in acute distress.    Appearance: Normal appearance. He is well-developed.  HENT:     Head: Normocephalic and atraumatic.  Eyes:     General: Lids are normal. No scleral icterus.       Right eye: No discharge.        Left eye: No discharge.     Conjunctiva/sclera: Conjunctivae normal.  Neck:     Vascular: No carotid bruit  or JVD.  Cardiovascular:     Rate and Rhythm: Normal rate and regular rhythm.     Heart sounds: Normal heart sounds.  Pulmonary:     Effort: Pulmonary effort is normal. No respiratory distress.     Breath sounds: Normal breath sounds.  Abdominal:     Palpations: There is no hepatomegaly or splenomegaly.  Musculoskeletal:        General: Normal range of motion.     Cervical back: Normal range of motion and neck supple.  Skin:    General: Skin is warm and Woods.     Coloration: Skin is not pale.     Findings: No rash.  Neurological:     Mental Status: He is alert and oriented to person, place, and time.  Psychiatric:        Behavior: Behavior normal.        Thought Content: Thought content normal.        Judgment: Judgment normal.     Assessment & Plan:   Problem List Items Addressed This Visit       Unprioritized   Abnormal chest CT    Re-order chest CT with contrast for f/u      Adrenal adenoma    Another referral to Endocrine for management      Relevant Orders    Ambulatory referral to Endocrinology   Diabetes mellitus type 2, controlled, without complications (HCC)    Hgb A1C is 5.5.  Stop Metformin due to low GFR      Relevant Orders   AMB Referral to Community Care Coordinaton   Hypertension, goal below 140/90    Continue Lisinopril.  Refer to nephrology      Stage 4 chronic kidney disease ([place]) - Primary    Last GFR 24.  Urgent referral to Nephrology.  Lost to f/u      Relevant Orders   Ambulatory referral to Nephrology   POCT HgB A1C (Completed)   AMB Referral to [place]   Other Visit Diagnoses     Abnormal findings on diagnostic imaging of lung       Relevant Orders   CT Chest W Contrast        Follow up plan: Return in about 3 months (around 09/02/2021).  Addendum:  Called pharmacy and patient has filled the following meds - Tamulosin, Lisinopri, Atorvastatin --Reluctant to have multiple referrals and testing.  Will refer to chronic care management  Addendum: Cancelled CT due to renal function.  Refer to pulmonary instead

## 2021-06-04 ENCOUNTER — Telehealth: Payer: Self-pay | Admitting: *Deleted

## 2021-06-04 NOTE — Chronic Care Management (AMB) (Signed)
  Chronic Care Management   Note  06/04/2021 Name: Scott Woods MRN: [id number] DOB: 11-27-1950  Scott Woods is a 70 y.o. year old male who is a primary care patient of [facility] reached out to [name] by phone today in response to a referral sent by [name]'s PCP, Kathrine Haddock, NP      [name] was given information about Chronic Care Management services today including:  CCM service includes personalized support from designated clinical staff supervised by his physician, including individualized plan of care and coordination with other care providers 24/7 contact phone numbers for assistance for urgent and routine care needs. Service will only be billed when office clinical staff spend 20 minutes or more in a month to coordinate care. Only one practitioner may furnish and bill the service in a calendar month. The patient may stop CCM services at any time (effective at the end of the month) by phone call to the office staff. The patient will be responsible for cost sharing (co-pay) of up to 20% of the service fee (after annual deductible is met).  Patient agreed to services and verbal consent obtained.   Follow up plan: Telephone appointment with care management team member scheduled for: 06/10/2021  Julian Hy, [place] Management  Direct Dial: [phone]

## 2021-06-09 NOTE — Addendum Note (Signed)
Addended by: Kathrine Haddock on: 06/09/2021 01:22 PM   Modules accepted: Orders

## 2021-06-10 ENCOUNTER — Ambulatory Visit (INDEPENDENT_AMBULATORY_CARE_PROVIDER_SITE_OTHER): Payer: Medicare (Managed Care)

## 2021-06-10 DIAGNOSIS — E785 Hyperlipidemia, unspecified: Secondary | ICD-10-CM

## 2021-06-10 DIAGNOSIS — I10 Essential (primary) hypertension: Secondary | ICD-10-CM

## 2021-06-10 DIAGNOSIS — E119 Type 2 diabetes mellitus without complications: Secondary | ICD-10-CM

## 2021-06-10 NOTE — Patient Instructions (Addendum)
Thank you for allowing the Chronic Care Management team to participate in your care.    Patient Care Plan: Diabetes Type 2 (Adult)     Problem Identified: Glycemic Management (Diabetes, Type 2)      Long-Range Goal: Glycemic Management Optimized   Start Date: 06/10/2021  Expected End Date: 09/08/2021  Priority: Medium  Note:   Objective:  Lab Results  Component Value Date   HGBA1C 5.5 06/03/2021    Current Barriers:  Chronic Disease Management support and educational needs related to Diabetes.  Case Manager Clinical Goal(s):  Over the next 90 days, patient will maintain optimal glycemic management as evidenced by adhering to a ADA/ carb modified diet and contacting provider for new or worsened symptoms or questions.  Interventions:  Collaboration with [name], PA-C regarding development and update of comprehensive plan of care as evidenced by provider attestation and co-signature Inter-disciplinary care team collaboration (see longitudinal plan of care) Discussed current plan for diabetes management. A1C is currently at goal. Reports discontinuing metformin as instructed. Reports engaging in regular activity/walking. Discussed nutritional intake and importance of complying with an ADA/modified carb diet. Reports significant improvements with his diet. He is using Boost supplements when needed during the week. Advised to keep the care management team updated of need for nutritional resources.  Discussed importance of completing recommended DM preventive care. Advised to complete recommended foot care. Reports last eye exam was over a year ago. Does not currently require prescription eyewear. Advised to schedule an eye exam. Discussed need for additional resources. Thoroughly discussed plan for keeping A1C at goal and maintaining optimal glycemic control.  Patient Goals/Self-Care Activities Attend all scheduled provider appointments Adhere to prescribed ADA/carb modified Notify  provider or care management team with questions and new concerns as needed    Follow Up Plan:  Will follow up in two months    Patient Care Plan: Hypertension and Hyperlipidemia     Problem Identified: Hypertension and Hyperlipidemia      Long-Range Goal: Hypertension and Hyperlipidemia   Start Date: 06/10/2021  Expected End Date: 09/08/2021  Priority: High  Note:   Current Barriers:  Chronic Disease Management support and educational needs related to HTN and HLD.  Case Manager Clinical Goal(s):  Over the next 90 days, patient will demonstrate improved adherence to prescribed treatment plan as evidenced by taking all medications as prescribed, monitoring and recording blood pressure and adhering to a low sodium/DASH diet.  Interventions:  Collaboration with [name], PA-C regarding development and update of comprehensive plan of care as evidenced by provider attestation and co-signature Inter-disciplinary care team collaboration (see longitudinal plan of care) Reviewed medications and importance of compliance. Advised to continue taking medication as prescribed. Advised to notify provider if unable to tolerate prescribed regimen. Encouraged to notify the care management team with concerns regarding medication management or prescription cost. Provided information regarding established blood pressure parameters along with indications for notifying a provider. Encouraged to monitor and record readings.  Discussed compliance with recommended cardiac prudent diet. Encouraged to read nutrition labels, monitor sodium intake and avoid highly processed foods when possible. Discussed activity tolerance. Reports engaging in low impact activity/walking frequently without difficulty Discussed complications of uncontrolled blood pressure. Reviewed s/sx of heart attack, stroke and worsening symptoms that require immediate medical attention.   Patient Goals/Self-Care  Activities: Self-administer medications as prescribed Monitor and record blood pressure Adhere to recommended cardiac prudent/heart healthy diet Notify provider or care management team with questions and new concerns as needed  Follow Up Plan:  Will follow up in two months       Scott Woods verbalized understanding of the information discussed during the telephonic outreach. Declined need for mailed/printed instructions. A member of the care management team will follow up in two months.    Cristy [facility] Care Management [facility] [phone]

## 2021-06-10 NOTE — Chronic Care Management (AMB) (Signed)
Chronic Care Management   CCM RN Visit Note  06/10/2021 Name: Scott Woods MRN: [id number] DOB: [birth year]  Subjective: Scott Woods is a 70 y.o. year old male who is a primary care patient of Scott Woods, [place]. The care management team was consulted for assistance with disease management and care coordination needs.    Engaged with patient by telephone for initial visit in response to provider referral for case management and care coordination services.   Consent to Services:  The patient was given the following information about Chronic Care Management services: 1. CCM service includes personalized support from designated clinical staff supervised by the primary care provider, including individualized plan of care and coordination with other care providers 2. 24/7 contact phone numbers for assistance for urgent and routine care needs. 3. Service will only be billed when office clinical staff spend 20 minutes or more in a month to coordinate care. 4. Only one practitioner may furnish and bill the service in a calendar month. 5.The patient may stop CCM services at any time (effective at the end of the month) by phone call to the office staff. 6. The patient will be responsible for cost sharing (co-pay) of up to 20% of the service fee (after annual deductible is met). Patient agreed to services and consent obtained.   Assessment: Review of patient past medical history, allergies, medications, health status, including review of consultants reports, laboratory and other test data, was performed as part of comprehensive evaluation and provision of chronic care management services.   SDOH (Social Determinants of Health) assessments and interventions performed:   SDOH Interventions    Flowsheet Row Most Recent Value  SDOH Interventions   Food Insecurity Interventions Intervention Not Indicated  Transportation Interventions Intervention Not Indicated        CCM Care Plan  No Known  Allergies  Outpatient Encounter Medications as of 06/10/2021  Medication Sig   atorvastatin (LIPITOR) 40 MG tablet TAKE 1 TABLET BY MOUTH EVERYDAY AT BEDTIME (Patient taking differently: Take 40 mg by mouth daily. TAKE 1 TABLET BY MOUTH EVERYDAY AT BEDTIME)   finasteride (PROSCAR) 5 MG tablet TAKE 1 TABLET BY MOUTH EVERY DAY (Patient taking differently: Take 5 mg by mouth daily.)   lisinopril (ZESTRIL) 10 MG tablet Take 10 mg by mouth daily.   tamsulosin (FLOMAX) 0.4 MG CAPS capsule Take 1 capsule (0.4 mg total) by mouth daily after supper.   No facility-administered encounter medications on file as of 06/10/2021.    Patient Active Problem List   Diagnosis Date Noted   Stage 4 chronic kidney disease ([place]) 06/03/2021   Hypocalcemia 02/17/2021   Increased anion gap metabolic acidosis 85/27/7824   Abnormal chest CT 02/12/2021   BPH with obstruction/lower urinary tract symptoms 04/01/2019   Type 2 diabetes mellitus with microalbuminuria, without long-term current use of insulin (Scott Woods) 11/16/2017   Adrenal adenoma 05/08/2015   Abnormal prostate specific antigen 05/08/2015   Hyperlipidemia LDL goal <100 05/08/2015   Hypertension, goal below 140/90 05/08/2015   Diabetes mellitus type 2, controlled, without complications (Scott Woods) 23/53/6144    Conditions to be addressed/monitored:HTN, HLD, and DMII Patient Care Plan: Diabetes Type 2 (Adult)     Problem Identified: Glycemic Management (Diabetes, Type 2)      Long-Range Goal: Glycemic Management Optimized   Start Date: 06/10/2021  Expected End Date: 09/08/2021  Priority: Medium  Note:   Objective:  Lab Results  Component Value Date   HGBA1C 5.5 06/03/2021    Current Barriers:  Chronic Disease Management support and educational needs related to Diabetes.  Case Manager Clinical Goal(s):  Over the next 90 days, patient will maintain optimal glycemic management as evidenced by adhering to a ADA/ carb modified diet and contacting provider  for new or worsened symptoms or questions.  Interventions:  Collaboration with Scott Grana, PA-C regarding development and update of comprehensive plan of care as evidenced by provider attestation and co-signature Inter-disciplinary care team collaboration (see longitudinal plan of care) Discussed current plan for diabetes management. A1C is currently at goal. Reports discontinuing metformin as instructed. Reports engaging in regular activity/walking. Discussed nutritional intake and importance of complying with an ADA/modified carb diet. Reports significant improvements with his diet. He is using Boost supplements when needed during the week. Advised to keep the care management team updated of need for nutritional resources.  Discussed importance of completing recommended DM preventive care. Advised to complete recommended foot care. Reports last eye exam was over a year ago. Does not currently require prescription eyewear. Advised to schedule an eye exam. Discussed need for additional resources. Thoroughly discussed plan for keeping A1C at goal and maintaining optimal glycemic control.  Patient Goals/Self-Care Activities Attend all scheduled provider appointments Adhere to prescribed ADA/carb modified Notify provider or care management team with questions and new concerns as needed    Follow Up Plan:  Will follow up in two months    Patient Care Plan: Hypertension and Hyperlipidemia     Problem Identified: Hypertension and Hyperlipidemia      Long-Range Goal: Hypertension and Hyperlipidemia   Start Date: 06/10/2021  Expected End Date: 09/08/2021  Priority: High  Note:   Current Barriers:  Chronic Disease Management support and educational needs related to HTN and HLD.  Case Manager Clinical Goal(s):  Over the next 90 days, patient will demonstrate improved adherence to prescribed treatment plan as evidenced by taking all medications as prescribed, monitoring and recording blood  pressure and adhering to a low sodium/DASH diet.  Interventions:  Collaboration with Scott Grana, PA-C regarding development and update of comprehensive plan of care as evidenced by provider attestation and co-signature Inter-disciplinary care team collaboration (see longitudinal plan of care) Reviewed medications and importance of compliance. Advised to continue taking medication as prescribed. Advised to notify provider if unable to tolerate prescribed regimen. Encouraged to notify the care management team with concerns regarding medication management or prescription cost. Provided information regarding established blood pressure parameters along with indications for notifying a provider. Encouraged to monitor and record readings.  Discussed compliance with recommended cardiac prudent diet. Encouraged to read nutrition labels, monitor sodium intake and avoid highly processed foods when possible. Discussed activity tolerance. Reports engaging in low impact activity/walking frequently without difficulty Discussed complications of uncontrolled blood pressure. Reviewed s/sx of heart attack, stroke and worsening symptoms that require immediate medical attention.   Patient Goals/Self-Care Activities: Self-administer medications as prescribed Monitor and record blood pressure Adhere to recommended cardiac prudent/heart healthy diet Notify provider or care management team with questions and new concerns as needed   Follow Up Plan:  Will follow up in two months       PLAN: A member of the care management team will follow up in two months.    [facility]risty [facility] Care Management [facility] [phone]

## 2021-06-16 NOTE — Telephone Encounter (Signed)
Copied from [place] [id number]. Topic: General - Other >> Jun 15, 2021  4:53 PM Celene Kras wrote: Reason for CRM: Cleaster Corin, from [employer], calling in regards to the pts chest CT. She states that the request is in peer to peer and is requesting to have more info. Please advise.    Tracking # [id number]

## 2021-06-17 ENCOUNTER — Inpatient Hospital Stay (HOSPITAL_COMMUNITY)
Admission: EM | Admit: 2021-06-17 | Discharge: 2021-07-20 | DRG: 682 | Disposition: E | Payer: Medicare (Managed Care) | Attending: Critical Care Medicine | Admitting: Critical Care Medicine

## 2021-06-17 ENCOUNTER — Emergency Department (HOSPITAL_COMMUNITY): Payer: Medicare (Managed Care)

## 2021-06-17 ENCOUNTER — Other Ambulatory Visit: Payer: Self-pay

## 2021-06-17 ENCOUNTER — Encounter (HOSPITAL_COMMUNITY): Payer: Self-pay

## 2021-06-17 DIAGNOSIS — Y92219 Unspecified school as the place of occurrence of the external cause: Secondary | ICD-10-CM

## 2021-06-17 DIAGNOSIS — E875 Hyperkalemia: Secondary | ICD-10-CM | POA: Diagnosis present

## 2021-06-17 DIAGNOSIS — N184 Chronic kidney disease, stage 4 (severe): Secondary | ICD-10-CM | POA: Diagnosis not present

## 2021-06-17 DIAGNOSIS — N179 Acute kidney failure, unspecified: Secondary | ICD-10-CM | POA: Diagnosis not present

## 2021-06-17 DIAGNOSIS — I7121 Aneurysm of the ascending aorta, without rupture: Secondary | ICD-10-CM | POA: Diagnosis not present

## 2021-06-17 DIAGNOSIS — J9690 Respiratory failure, unspecified, unspecified whether with hypoxia or hypercapnia: Secondary | ICD-10-CM | POA: Diagnosis not present

## 2021-06-17 DIAGNOSIS — Z66 Do not resuscitate: Secondary | ICD-10-CM | POA: Diagnosis not present

## 2021-06-17 DIAGNOSIS — G9341 Metabolic encephalopathy: Secondary | ICD-10-CM | POA: Diagnosis not present

## 2021-06-17 DIAGNOSIS — Y9381 Activity, refereeing a sports activity: Secondary | ICD-10-CM | POA: Diagnosis not present

## 2021-06-17 DIAGNOSIS — R0603 Acute respiratory distress: Secondary | ICD-10-CM

## 2021-06-17 DIAGNOSIS — J939 Pneumothorax, unspecified: Secondary | ICD-10-CM | POA: Diagnosis not present

## 2021-06-17 DIAGNOSIS — N189 Chronic kidney disease, unspecified: Secondary | ICD-10-CM | POA: Diagnosis present

## 2021-06-17 DIAGNOSIS — G40909 Epilepsy, unspecified, not intractable, without status epilepticus: Secondary | ICD-10-CM | POA: Diagnosis not present

## 2021-06-17 DIAGNOSIS — T17908A Unspecified foreign body in respiratory tract, part unspecified causing other injury, initial encounter: Secondary | ICD-10-CM | POA: Diagnosis not present

## 2021-06-17 DIAGNOSIS — Z820 Family history of epilepsy and other diseases of the nervous system: Secondary | ICD-10-CM

## 2021-06-17 DIAGNOSIS — F05 Delirium due to known physiological condition: Secondary | ICD-10-CM | POA: Diagnosis present

## 2021-06-17 DIAGNOSIS — Z8546 Personal history of malignant neoplasm of prostate: Secondary | ICD-10-CM

## 2021-06-17 DIAGNOSIS — Z20822 Contact with and (suspected) exposure to covid-19: Secondary | ICD-10-CM | POA: Diagnosis present

## 2021-06-17 DIAGNOSIS — E872 Acidosis, unspecified: Secondary | ICD-10-CM | POA: Diagnosis not present

## 2021-06-17 DIAGNOSIS — I351 Nonrheumatic aortic (valve) insufficiency: Secondary | ICD-10-CM | POA: Diagnosis not present

## 2021-06-17 DIAGNOSIS — G9340 Encephalopathy, unspecified: Secondary | ICD-10-CM | POA: Diagnosis not present

## 2021-06-17 DIAGNOSIS — Z4682 Encounter for fitting and adjustment of non-vascular catheter: Secondary | ICD-10-CM | POA: Diagnosis not present

## 2021-06-17 DIAGNOSIS — T17908D Unspecified foreign body in respiratory tract, part unspecified causing other injury, subsequent encounter: Secondary | ICD-10-CM | POA: Diagnosis not present

## 2021-06-17 DIAGNOSIS — D62 Acute posthemorrhagic anemia: Secondary | ICD-10-CM | POA: Diagnosis not present

## 2021-06-17 DIAGNOSIS — Z79899 Other long term (current) drug therapy: Secondary | ICD-10-CM

## 2021-06-17 DIAGNOSIS — E538 Deficiency of other specified B group vitamins: Secondary | ICD-10-CM | POA: Diagnosis present

## 2021-06-17 DIAGNOSIS — G40409 Other generalized epilepsy and epileptic syndromes, not intractable, without status epilepticus: Secondary | ICD-10-CM | POA: Diagnosis not present

## 2021-06-17 DIAGNOSIS — I10 Essential (primary) hypertension: Secondary | ICD-10-CM

## 2021-06-17 DIAGNOSIS — G250 Essential tremor: Secondary | ICD-10-CM | POA: Diagnosis present

## 2021-06-17 DIAGNOSIS — W19XXXA Unspecified fall, initial encounter: Secondary | ICD-10-CM

## 2021-06-17 DIAGNOSIS — E1151 Type 2 diabetes mellitus with diabetic peripheral angiopathy without gangrene: Secondary | ICD-10-CM | POA: Diagnosis present

## 2021-06-17 DIAGNOSIS — Z8249 Family history of ischemic heart disease and other diseases of the circulatory system: Secondary | ICD-10-CM

## 2021-06-17 DIAGNOSIS — E1122 Type 2 diabetes mellitus with diabetic chronic kidney disease: Secondary | ICD-10-CM | POA: Diagnosis present

## 2021-06-17 DIAGNOSIS — G931 Anoxic brain damage, not elsewhere classified: Secondary | ICD-10-CM | POA: Diagnosis not present

## 2021-06-17 DIAGNOSIS — D631 Anemia in chronic kidney disease: Secondary | ICD-10-CM | POA: Diagnosis present

## 2021-06-17 DIAGNOSIS — I082 Rheumatic disorders of both aortic and tricuspid valves: Secondary | ICD-10-CM | POA: Diagnosis not present

## 2021-06-17 DIAGNOSIS — E785 Hyperlipidemia, unspecified: Secondary | ICD-10-CM | POA: Diagnosis not present

## 2021-06-17 DIAGNOSIS — R55 Syncope and collapse: Secondary | ICD-10-CM | POA: Diagnosis present

## 2021-06-17 DIAGNOSIS — Z515 Encounter for palliative care: Secondary | ICD-10-CM

## 2021-06-17 DIAGNOSIS — N183 Chronic kidney disease, stage 3 unspecified: Secondary | ICD-10-CM | POA: Diagnosis not present

## 2021-06-17 DIAGNOSIS — I2510 Atherosclerotic heart disease of native coronary artery without angina pectoris: Secondary | ICD-10-CM | POA: Diagnosis present

## 2021-06-17 DIAGNOSIS — N400 Enlarged prostate without lower urinary tract symptoms: Secondary | ICD-10-CM | POA: Diagnosis present

## 2021-06-17 DIAGNOSIS — Z8619 Personal history of other infectious and parasitic diseases: Secondary | ICD-10-CM

## 2021-06-17 DIAGNOSIS — I129 Hypertensive chronic kidney disease with stage 1 through stage 4 chronic kidney disease, or unspecified chronic kidney disease: Secondary | ICD-10-CM | POA: Diagnosis present

## 2021-06-17 DIAGNOSIS — E8809 Other disorders of plasma-protein metabolism, not elsewhere classified: Secondary | ICD-10-CM | POA: Diagnosis not present

## 2021-06-17 DIAGNOSIS — J9600 Acute respiratory failure, unspecified whether with hypoxia or hypercapnia: Secondary | ICD-10-CM | POA: Diagnosis not present

## 2021-06-17 DIAGNOSIS — E1165 Type 2 diabetes mellitus with hyperglycemia: Secondary | ICD-10-CM | POA: Diagnosis not present

## 2021-06-17 DIAGNOSIS — I469 Cardiac arrest, cause unspecified: Secondary | ICD-10-CM | POA: Diagnosis not present

## 2021-06-17 DIAGNOSIS — J9601 Acute respiratory failure with hypoxia: Secondary | ICD-10-CM | POA: Diagnosis not present

## 2021-06-17 DIAGNOSIS — E119 Type 2 diabetes mellitus without complications: Secondary | ICD-10-CM | POA: Diagnosis not present

## 2021-06-17 DIAGNOSIS — G253 Myoclonus: Secondary | ICD-10-CM | POA: Diagnosis not present

## 2021-06-17 DIAGNOSIS — Z9911 Dependence on respirator [ventilator] status: Secondary | ICD-10-CM | POA: Diagnosis not present

## 2021-06-17 DIAGNOSIS — Z7189 Other specified counseling: Secondary | ICD-10-CM | POA: Diagnosis not present

## 2021-06-17 DIAGNOSIS — Z833 Family history of diabetes mellitus: Secondary | ICD-10-CM

## 2021-06-17 DIAGNOSIS — Z4659 Encounter for fitting and adjustment of other gastrointestinal appliance and device: Secondary | ICD-10-CM

## 2021-06-17 DIAGNOSIS — W07XXXA Fall from chair, initial encounter: Secondary | ICD-10-CM | POA: Diagnosis present

## 2021-06-17 DIAGNOSIS — N170 Acute kidney failure with tubular necrosis: Principal | ICD-10-CM | POA: Diagnosis present

## 2021-06-17 DIAGNOSIS — Z781 Physical restraint status: Secondary | ICD-10-CM

## 2021-06-17 LAB — CBC WITH DIFFERENTIAL/PLATELET
Abs Immature Granulocytes: 0.05 10*3/uL (ref 0.00–0.07)
Basophils Absolute: 0 10*3/uL (ref 0.0–0.1)
Basophils Relative: 0 %
Eosinophils Absolute: 0.1 10*3/uL (ref 0.0–0.5)
Eosinophils Relative: 1 %
HCT: 25.6 % — ABNORMAL LOW (ref 39.0–52.0)
Hemoglobin: 8.2 g/dL — ABNORMAL LOW (ref 13.0–17.0)
Immature Granulocytes: 1 %
Lymphocytes Relative: 13 %
Lymphs Abs: 1.2 10*3/uL (ref 0.7–4.0)
MCH: 32 pg (ref 26.0–34.0)
MCHC: 32 g/dL (ref 30.0–36.0)
MCV: 100 fL (ref 80.0–100.0)
Monocytes Absolute: 0.5 10*3/uL (ref 0.1–1.0)
Monocytes Relative: 5 %
Neutro Abs: 7 10*3/uL (ref 1.7–7.7)
Neutrophils Relative %: 80 %
Platelets: 192 10*3/uL (ref 150–400)
RBC: 2.56 MIL/uL — ABNORMAL LOW (ref 4.22–5.81)
RDW: 12.8 % (ref 11.5–15.5)
WBC: 8.8 10*3/uL (ref 4.0–10.5)
nRBC: 0 % (ref 0.0–0.2)

## 2021-06-17 LAB — COMPREHENSIVE METABOLIC PANEL
ALT: 18 U/L (ref 0–44)
AST: 16 U/L (ref 15–41)
Albumin: 3.1 g/dL — ABNORMAL LOW (ref 3.5–5.0)
Alkaline Phosphatase: 66 U/L (ref 38–126)
Anion gap: 9 (ref 5–15)
BUN: 44 mg/dL — ABNORMAL HIGH (ref 8–23)
CO2: 22 mmol/L (ref 22–32)
Calcium: 9.6 mg/dL (ref 8.9–10.3)
Chloride: 106 mmol/L (ref 98–111)
Creatinine, Ser: 6.23 mg/dL — ABNORMAL HIGH (ref 0.61–1.24)
GFR, Estimated: 9 mL/min — ABNORMAL LOW (ref >60)
Glucose, Bld: 124 mg/dL — ABNORMAL HIGH (ref 70–99)
Potassium: 6.3 mmol/L (ref 3.5–5.1)
Sodium: 137 mmol/L (ref 135–145)
Total Bilirubin: 0.5 mg/dL (ref 0.3–1.2)
Total Protein: 6.5 g/dL (ref 6.5–8.1)

## 2021-06-17 LAB — TROPONIN I (HIGH SENSITIVITY): Troponin I (High Sensitivity): 5 ng/L (ref <18)

## 2021-06-17 LAB — LACTIC ACID, PLASMA: Lactic Acid, Venous: 0.9 mmol/L (ref 0.5–1.9)

## 2021-06-17 IMAGING — CT CT ABD-PELV W/O CM
2 of 4 series · 16 of 46 positions shown, 18 images · non-contrast
Comparison: None.

CLINICAL DATA: Abdominal trauma

EXAM:
CT ABDOMEN AND PELVIS WITHOUT CONTRAST
TECHNIQUE: Multidetector CT imaging of the abdomen and pelvis was performed
following the standard protocol without IV contrast.

[Series 3: ap without · axial · non-contrast · 0.86mm/px · z∈[+282,+728]mm · 13 of 101 slices shown, 15 images]
[im 6/101  soft-tissue]
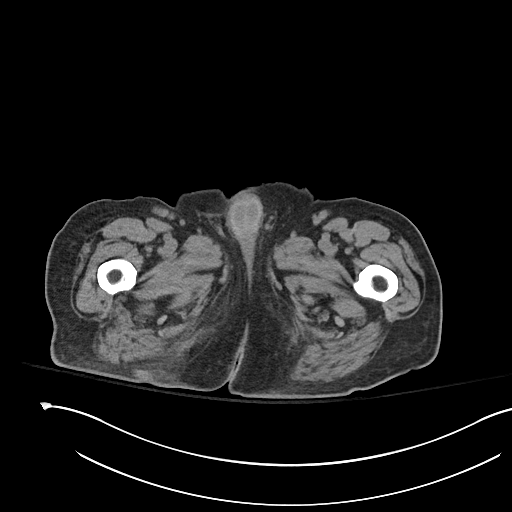
[im 6/101  bone]
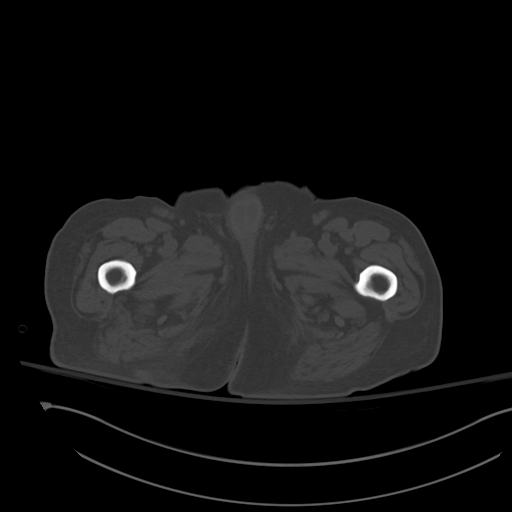
[im 16/101  soft-tissue]
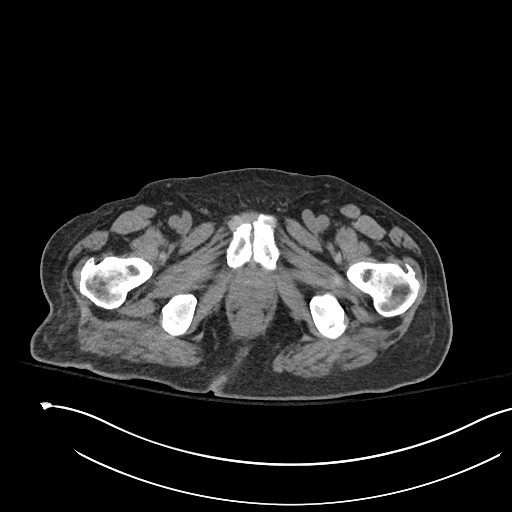
[im 22/101  soft-tissue]
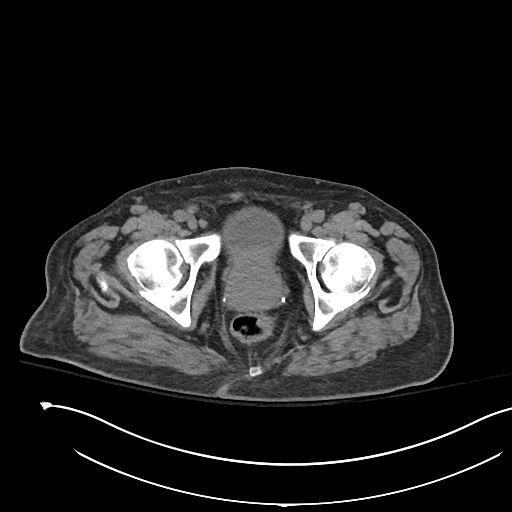
[im 27/101  soft-tissue]
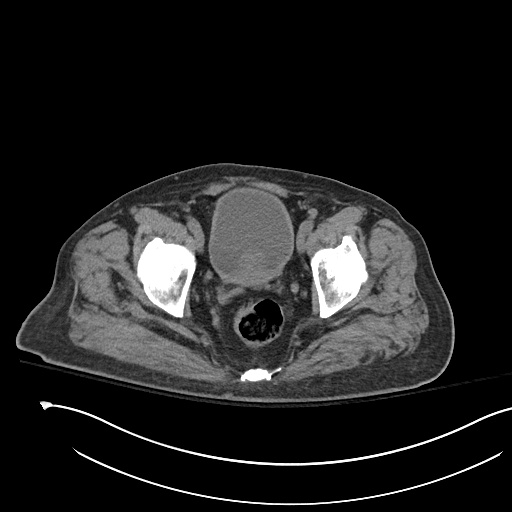
[im 37/101  soft-tissue]
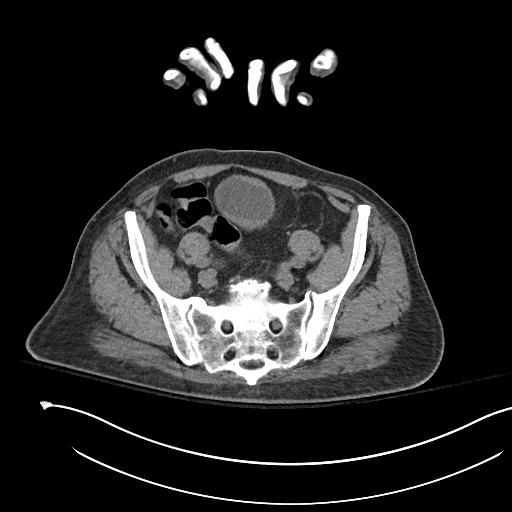
[im 43/101  soft-tissue]
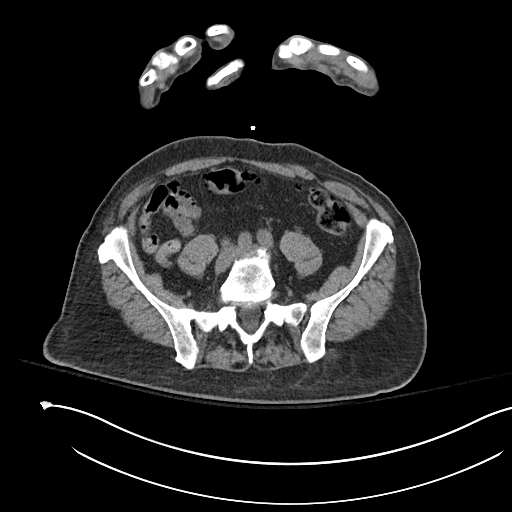
[im 53/101  soft-tissue]
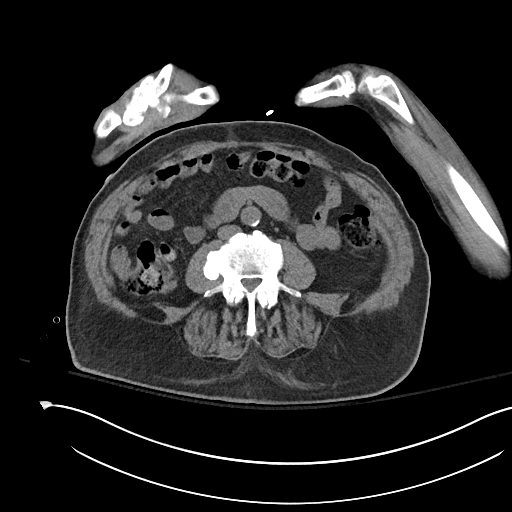
[im 58/101  soft-tissue]
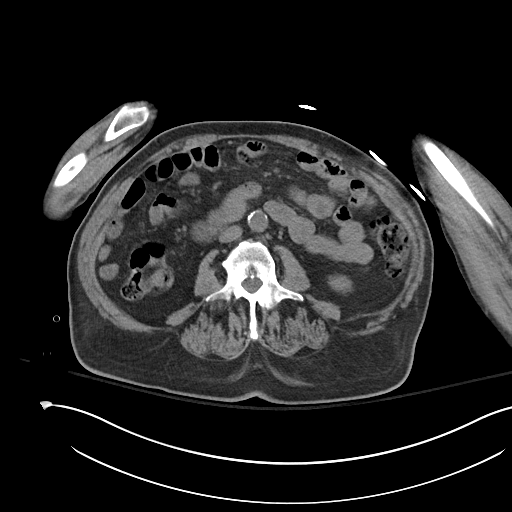
[im 64/101  soft-tissue]
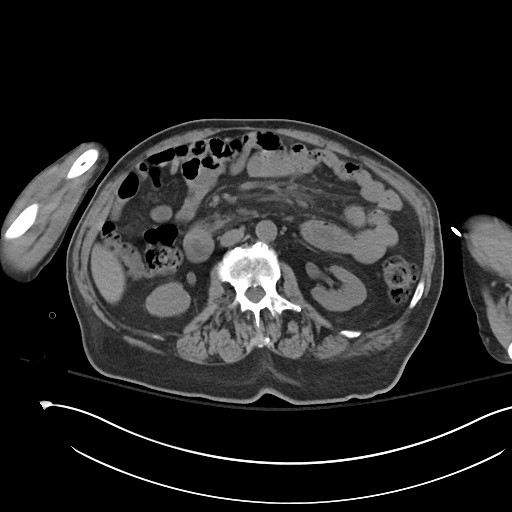
[im 64/101  bone]
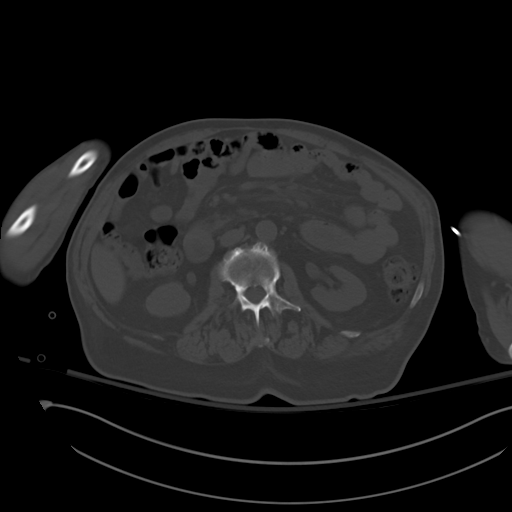
[im 74/101  soft-tissue]
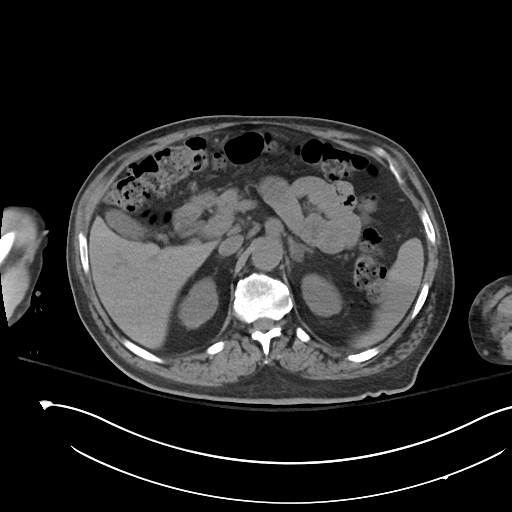
[im 79/101  soft-tissue]
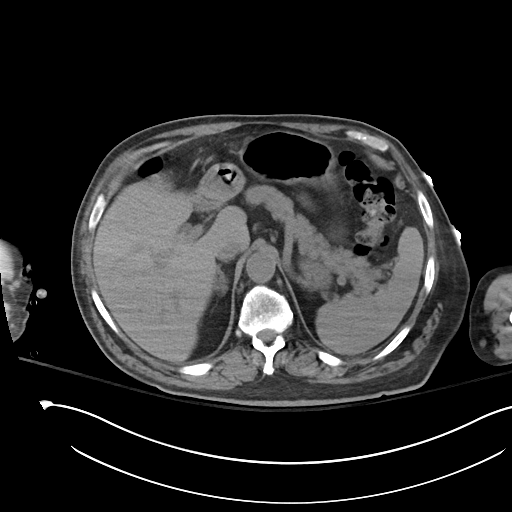
[im 85/101  soft-tissue]
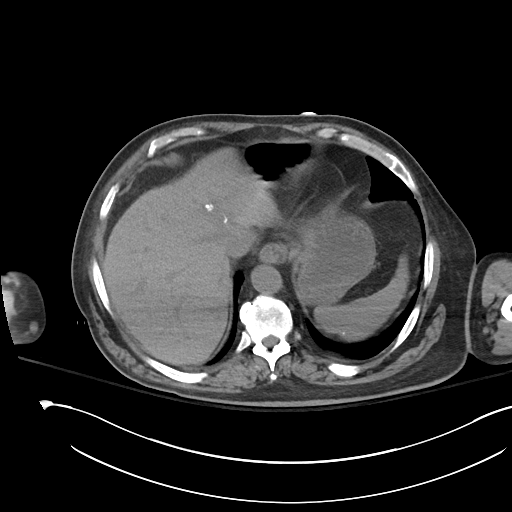
[im 95/101  soft-tissue]
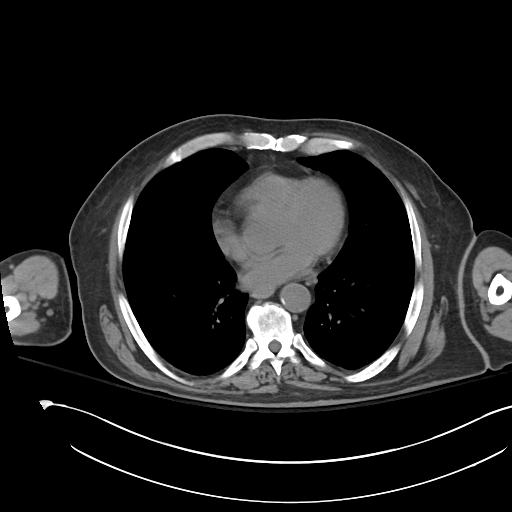

[Series 6: cor · coronal · 0.72mm/px · 3 of 93 slices shown]
[im 31/93  soft-tissue]
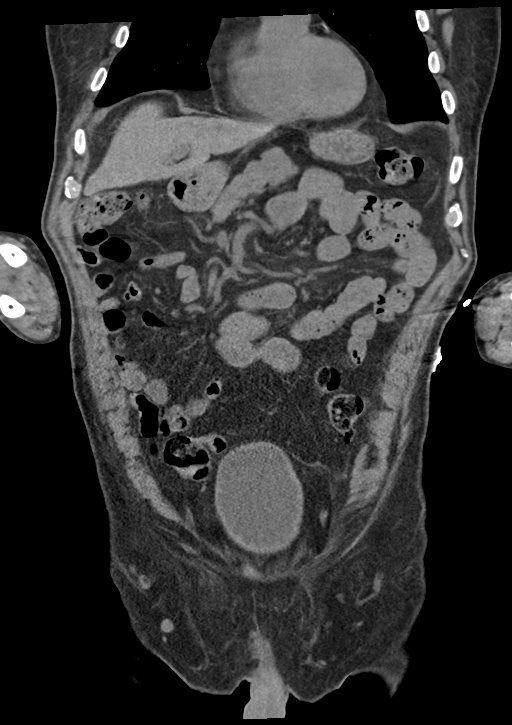
[im 41/93  soft-tissue]
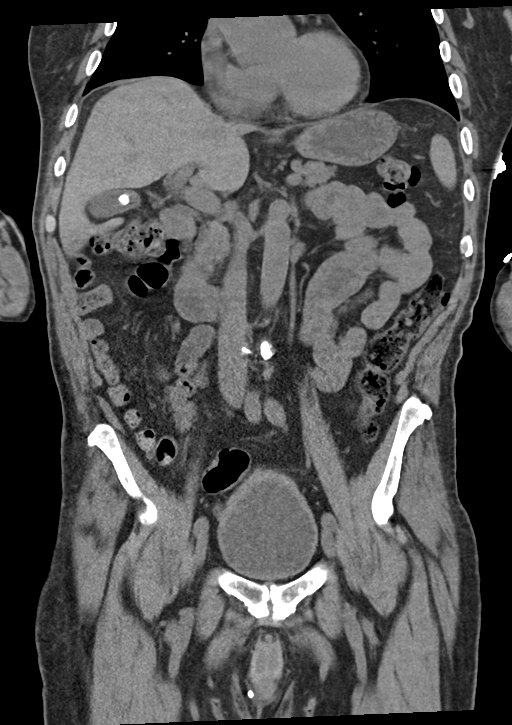
[im 52/93  soft-tissue]
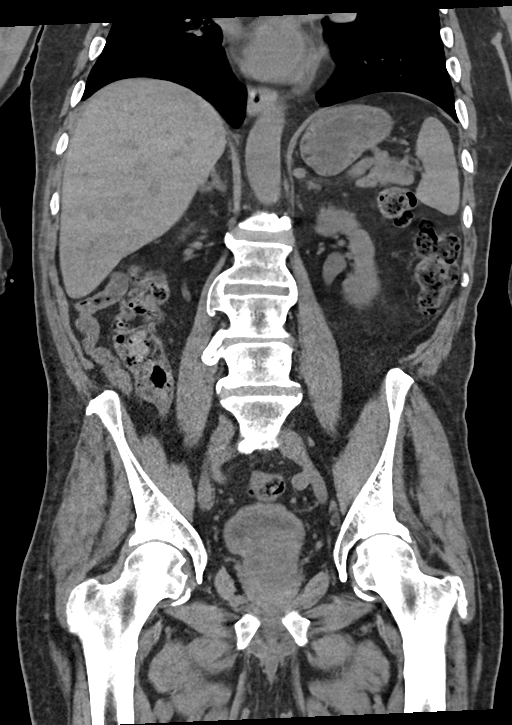

[16 of 46 positions shown; findings below may reference images not displayed]

FINDINGS: LOWER CHEST: Normal.

HEPATOBILIARY: Normal hepatic contours. No intra- or extrahepatic
biliary dilatation. There is cholelithiasis without acute
inflammation.

PANCREAS: Normal pancreas. No ductal dilatation or peripancreatic
fluid collection.

SPLEEN: Normal.

ADRENALS/URINARY TRACT: Unchanged appearance of small left adrenal
adenoma. No hydronephrosis, nephroureterolithiasis or solid renal
mass. The urinary bladder is normal for degree of distention

STOMACH/BOWEL: There is no hiatal hernia. Normal duodenal course and
caliber. No small bowel dilatation or inflammation. No focal colonic
abnormality. Normal appendix.

VASCULAR/LYMPHATIC: There is calcific atherosclerosis of the
abdominal aorta. No lymphadenopathy.

REPRODUCTIVE: Enlarged prostate measures 5.0 cm in transverse
dimension.

MUSCULOSKELETAL. No bony spinal canal stenosis or focal osseous
abnormality.

OTHER: None.
IMPRESSION: 1. No acute abnormality of the abdomen or pelvis.
2. Unchanged appearance of small left adrenal adenoma.
3. Cholelithiasis without acute inflammation.

Aortic Atherosclerosis ([P2]-[P2]).

## 2021-06-17 IMAGING — CR DG RIBS W/ CHEST 3+V*L*
6 series · 6 of 6 positions shown · non-contrast
Comparison: [DATE]

CLINICAL DATA: Fall, rib pain

EXAM:
LEFT RIBS AND CHEST - 3+ VIEW

[chest ap (1 of 2)]
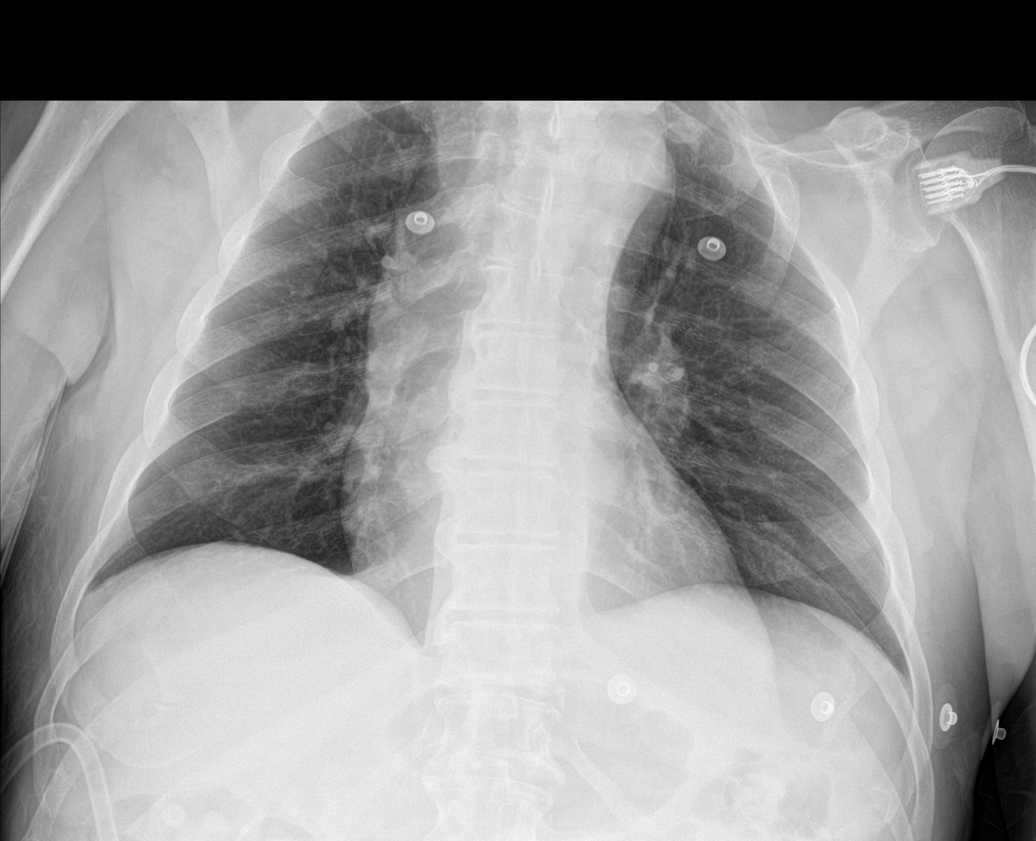

[rib ap (1 of 2)]
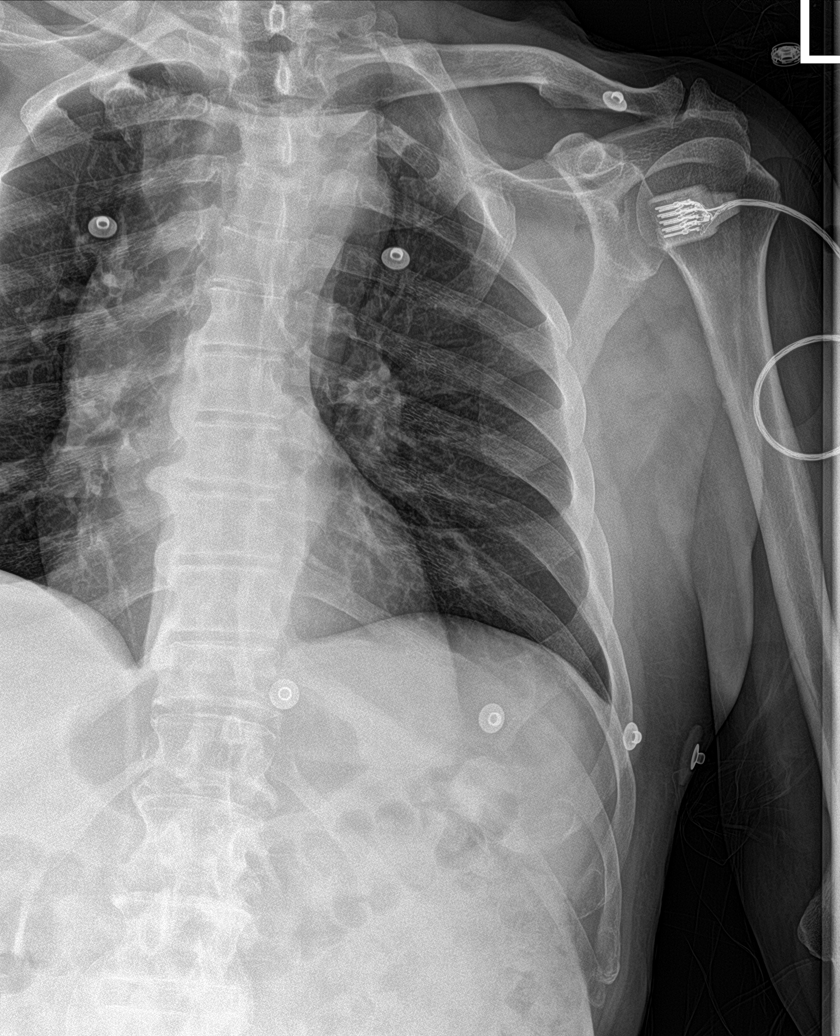

[chest ap (2 of 2)]
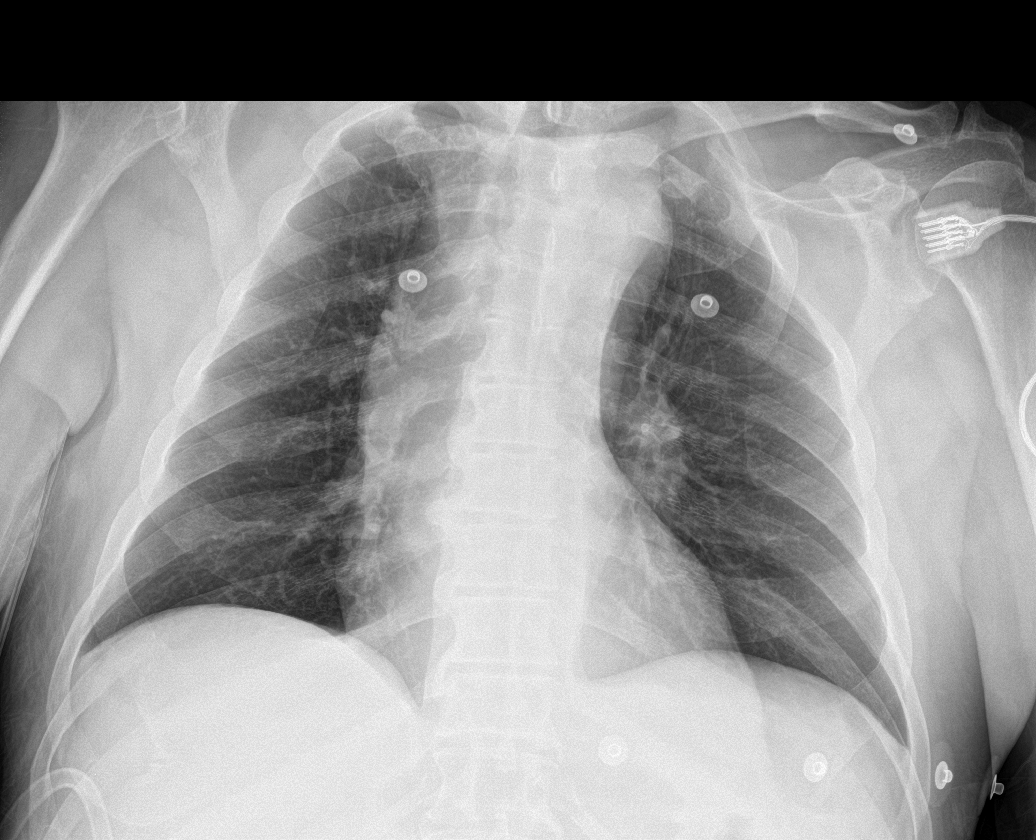

[rib ap (2 of 2)]
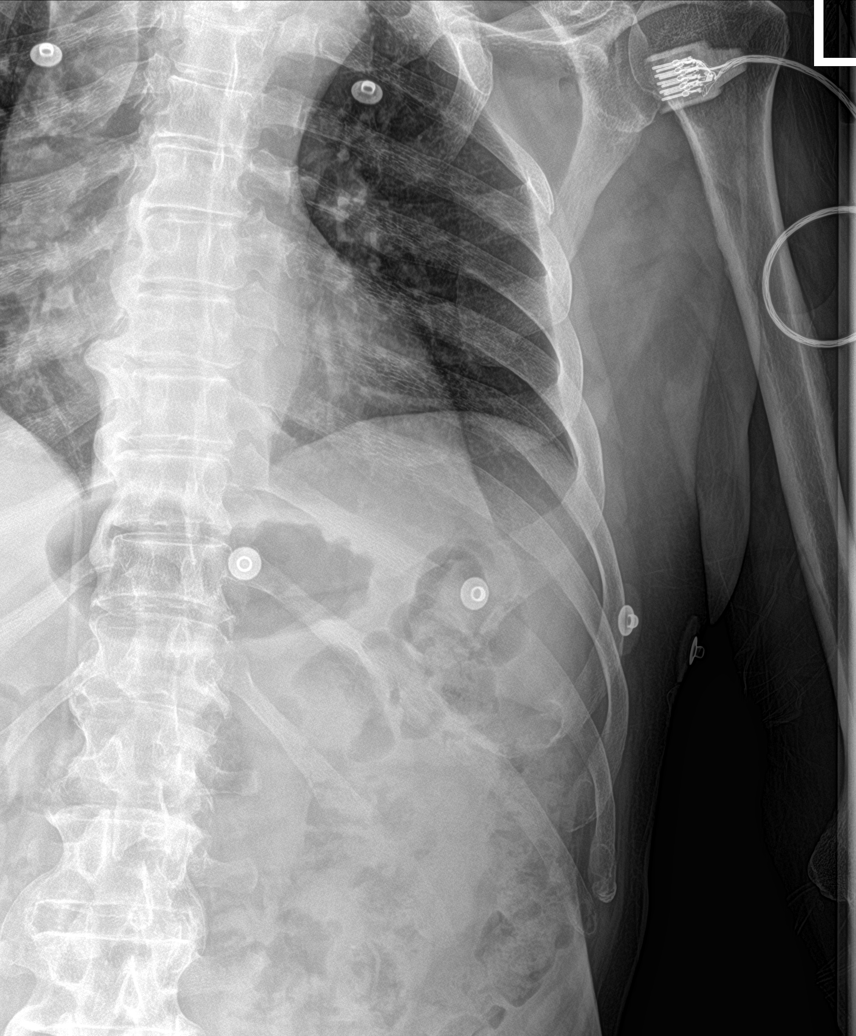

[rib ap obl (1 of 2)]
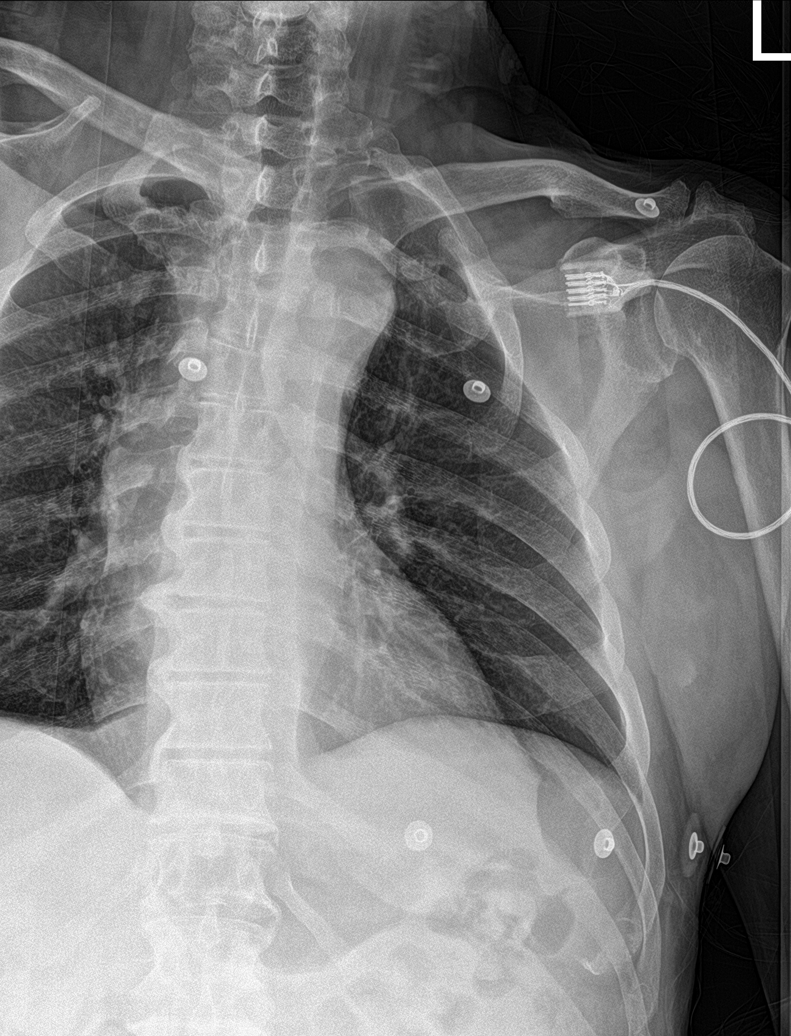

[rib ap obl (2 of 2)]
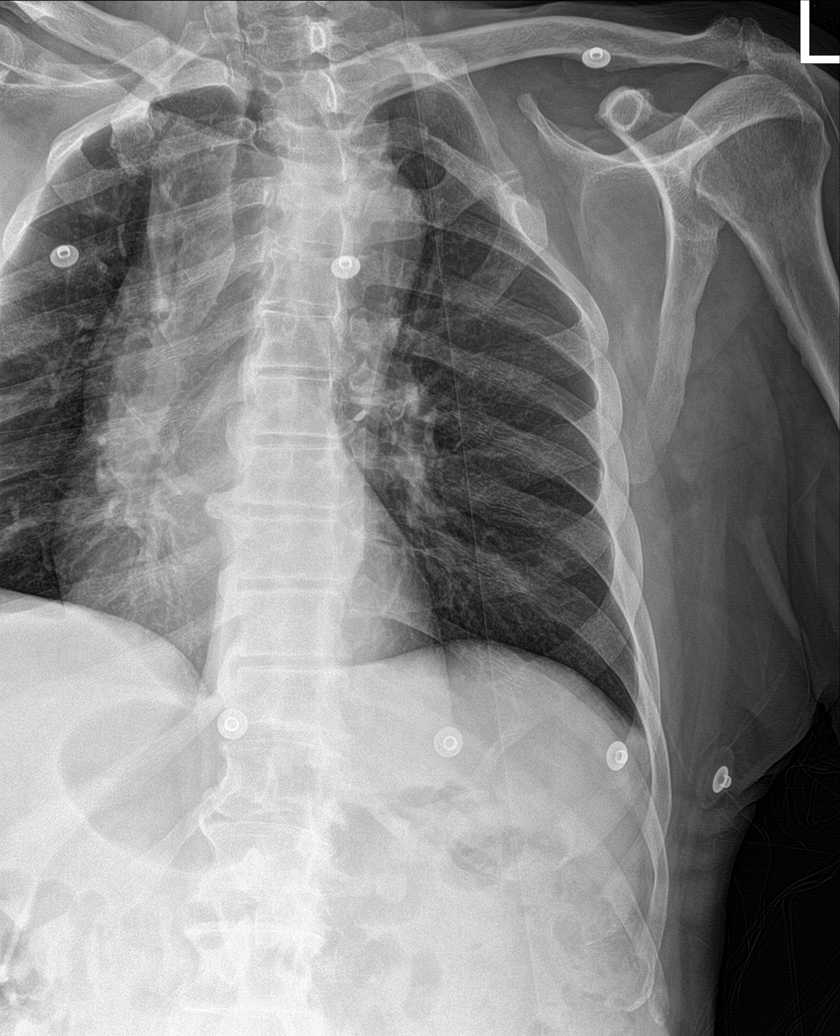

[6 of 6 positions shown; findings below may reference images not displayed]

FINDINGS: Congenital thoracic cage deformity with synchondrosis of the left
first, second, and third ribs is unchanged. No acute bone
abnormality. Lungs are clear. No pneumothorax or pleural effusion.
Cardiac size within normal limits. No superior mediastinal widening.
Pulmonary vascularity is normal.
IMPRESSION: No acute abnormality.

## 2021-06-17 IMAGING — CT CT CERVICAL SPINE W/O CM
3 of 4 series · 11 of 35 positions shown, 13 images · non-contrast
Comparison: [DATE]

CLINICAL DATA: Head and neck trauma, fell

EXAM:
CT CERVICAL SPINE WITHOUT CONTRAST
TECHNIQUE: Multidetector CT imaging of the cervical spine was performed without
intravenous contrast. Multiplanar CT image reconstructions were also
generated.

[Series 8: sag bone · sagittal · 0.31mm/px · 5 of 80 slices shown, 6 images]
[im 27/80  bone]
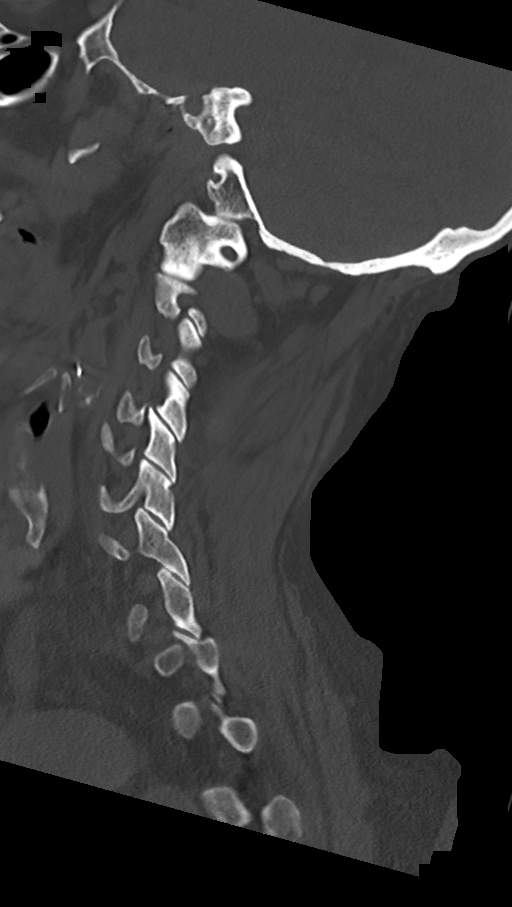
[im 33/80  bone]
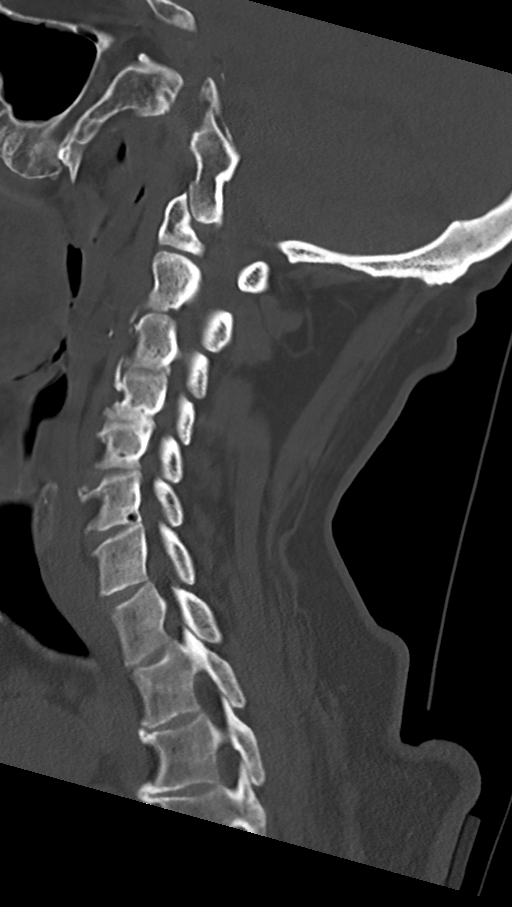
[im 40/80  soft-tissue]
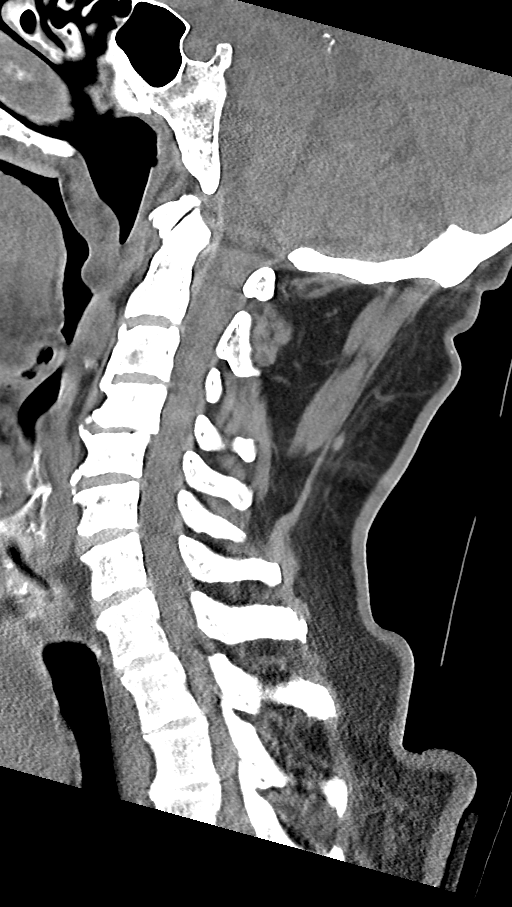
[im 40/80  bone]
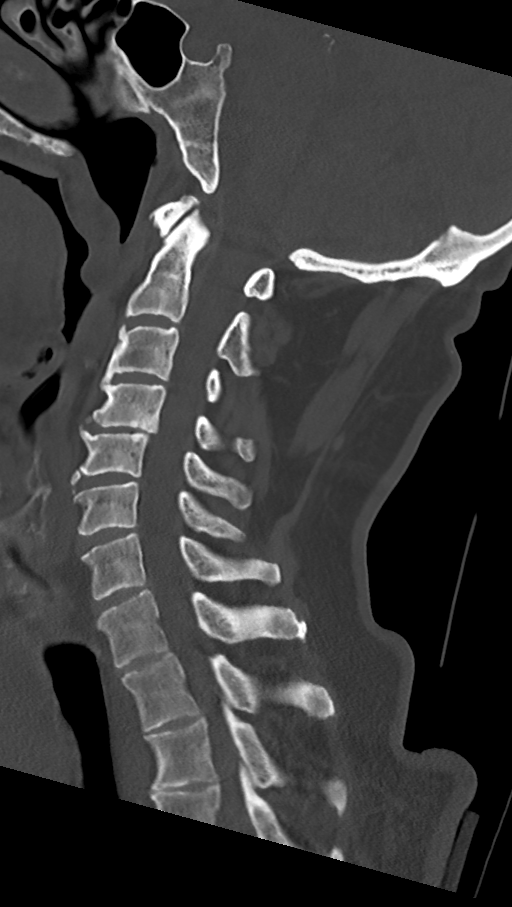
[im 47/80  bone]
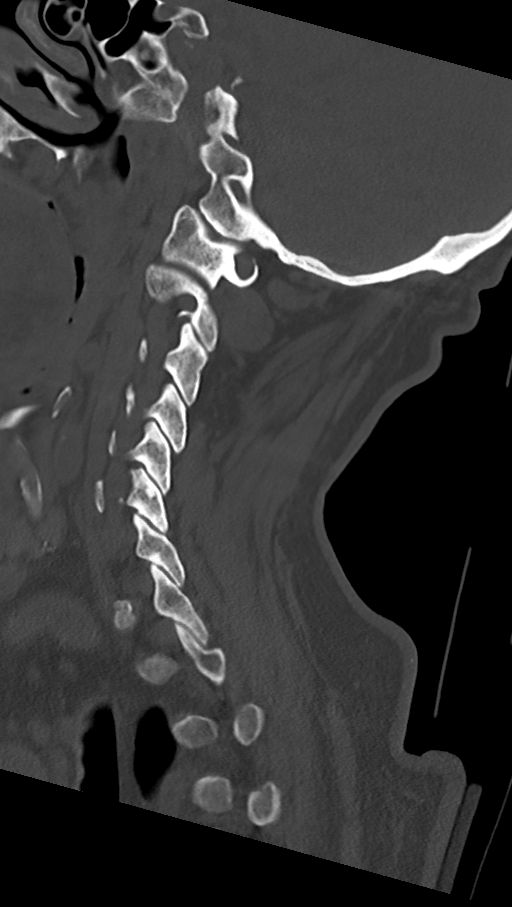
[im 53/80  bone]
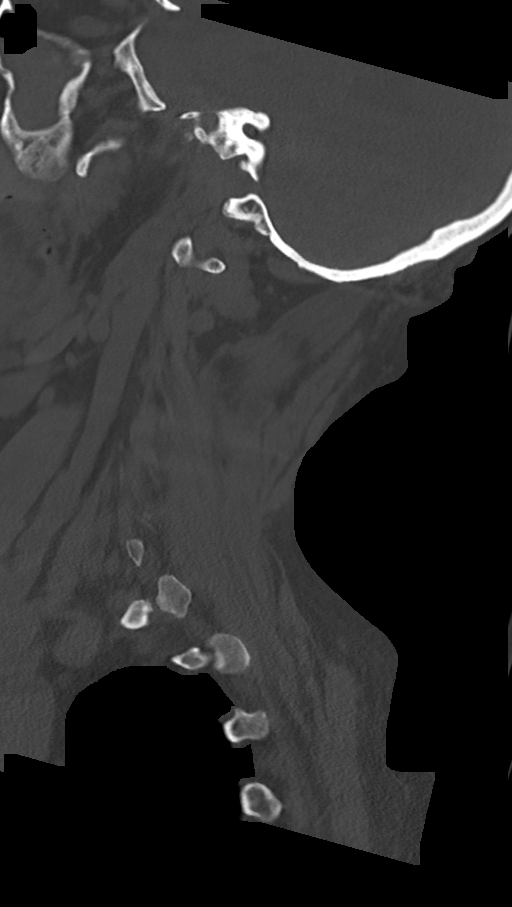

[Series 9: cor bone · coronal · 0.33mm/px · 3 of 77 slices shown]
[im 16/77  bone]
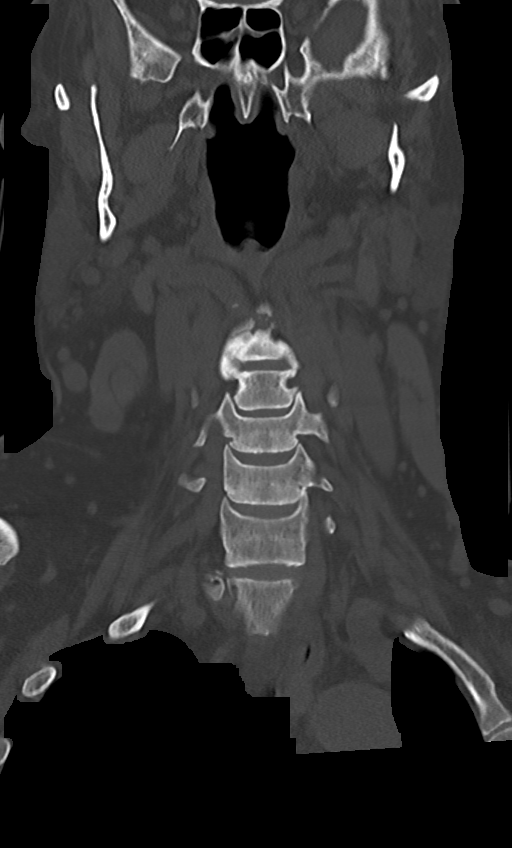
[im 31/77  bone]
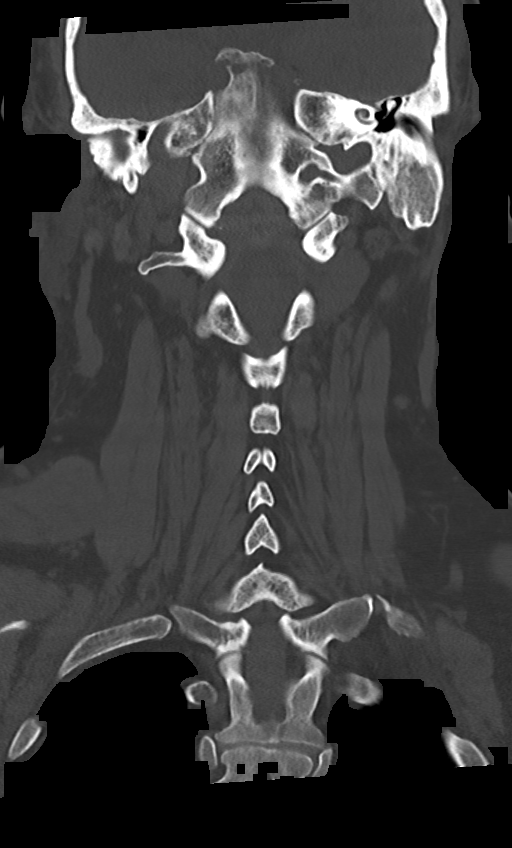
[im 46/77  bone]
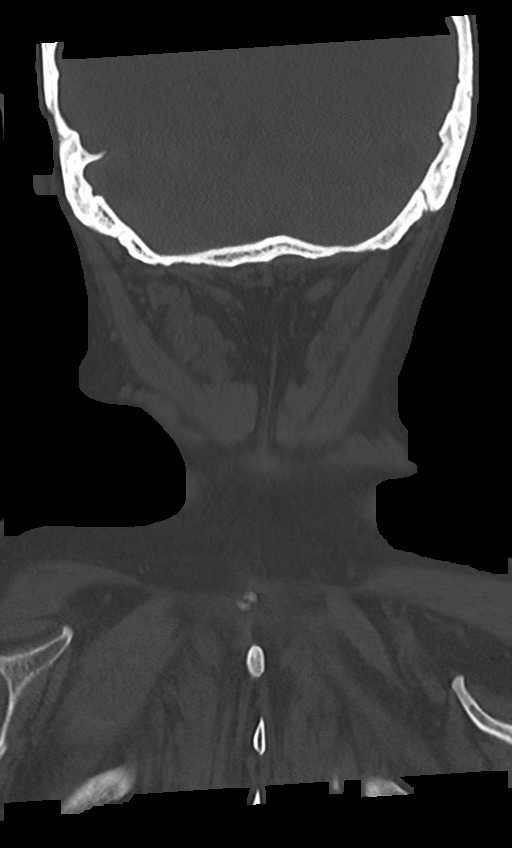

[Series 10: orthogonal axials · axial · 0.21mm/px · z∈[+861,+993]mm · 3 of 105 slices shown, 4 images]
[im 21/105  soft-tissue]
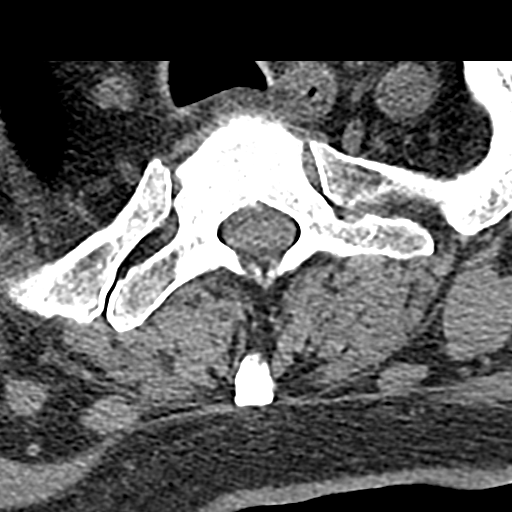
[im 21/105  bone]
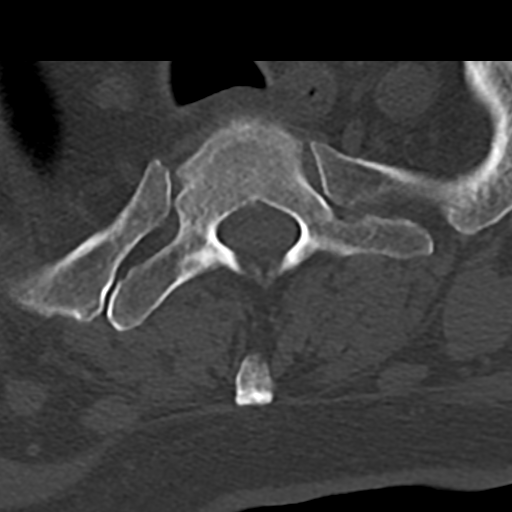
[im 63/105  bone]
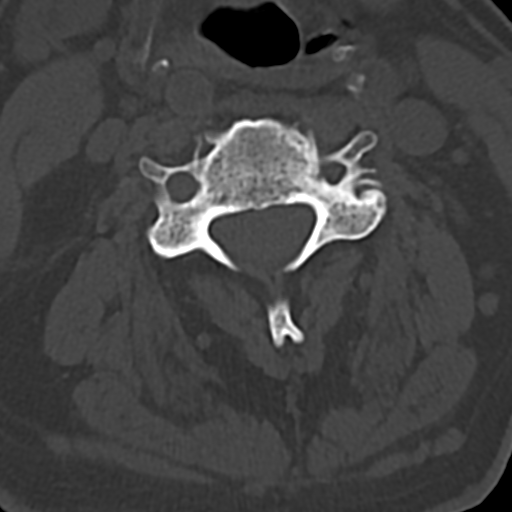
[im 84/105  bone]
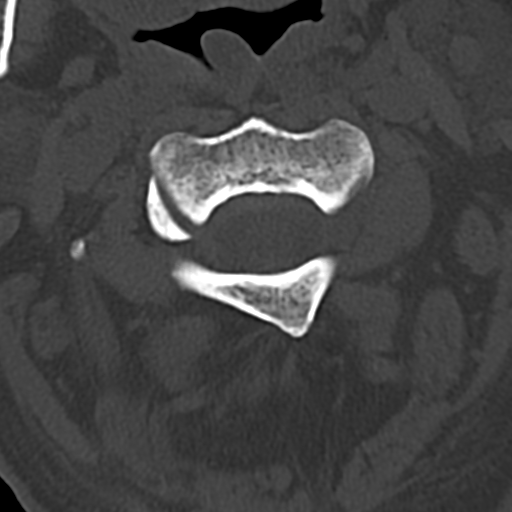

[11 of 35 positions shown; findings below may reference images not displayed]

FINDINGS: Alignment: Alignment is anatomic.

Skull base and vertebrae: No acute fracture. No primary bone lesion
or focal pathologic process.

Soft tissues and spinal canal: No prevertebral fluid or swelling. No
visible canal hematoma.

Disc levels: Mild diffuse cervical spondylosis most pronounced at
C4-5.

Upper chest: Airway is patent.  Lung apices are clear.

Other: Reconstructed images demonstrate no additional findings.
IMPRESSION: 1. No acute cervical spine fracture.
2. Stable cervical spondylosis.

## 2021-06-17 IMAGING — CT CT HEAD W/O CM
4 series · 17 of 47 positions shown, 19 images · non-contrast
Comparison: [DATE]

CLINICAL DATA: Head and neck trauma, fell

EXAM:
CT HEAD WITHOUT CONTRAST
TECHNIQUE: Contiguous axial images were obtained from the base of the skull
through the vertex without intravenous contrast.

[Series 3: head wo · axial · 0.47mm/px · z∈[+1004,+1154]mm · 7 of 41 slices shown, 9 images]
[im 6/41  brain]
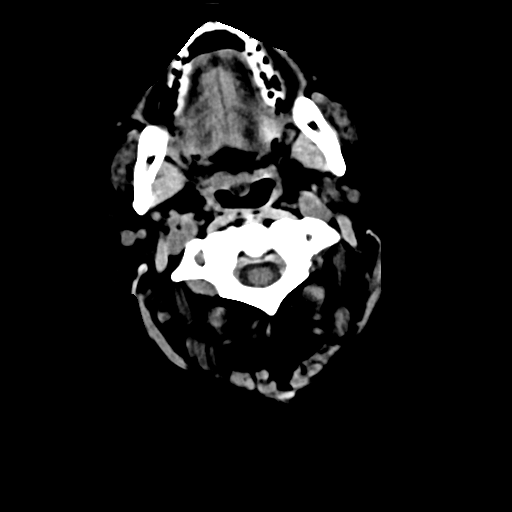
[im 6/41  bone]
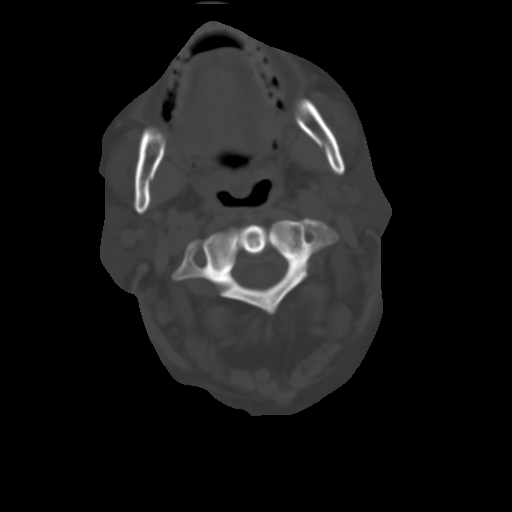
[im 11/41  brain]
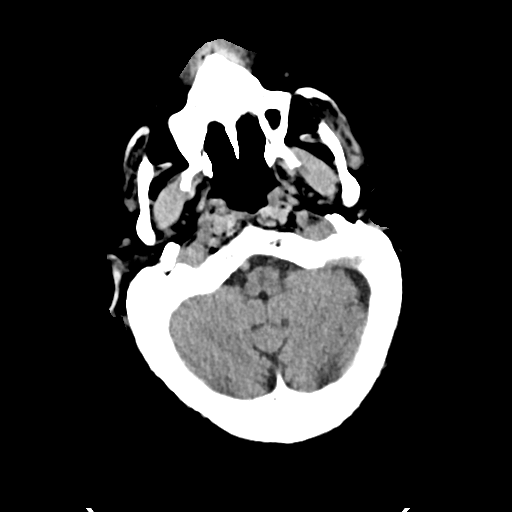
[im 16/41  brain]
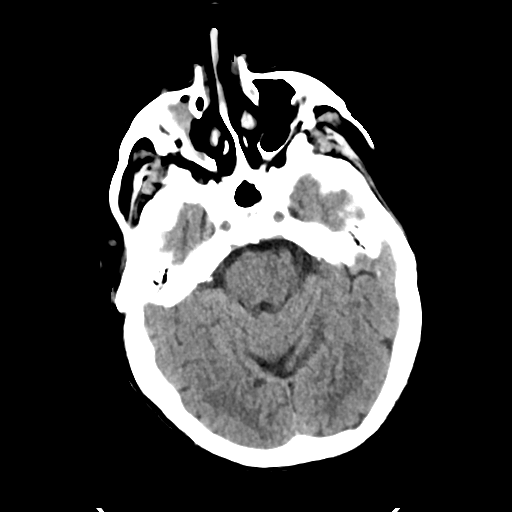
[im 21/41  brain]
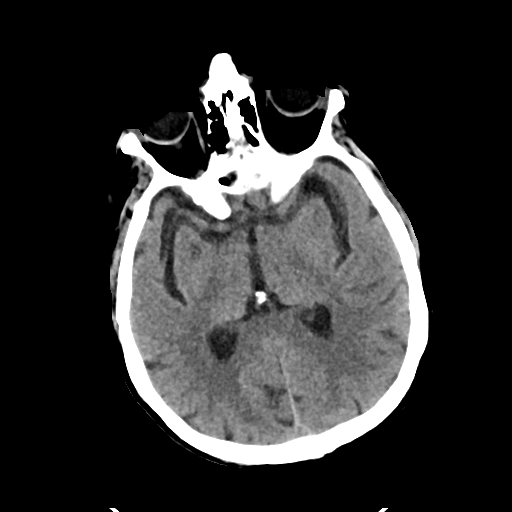
[im 26/41  brain]
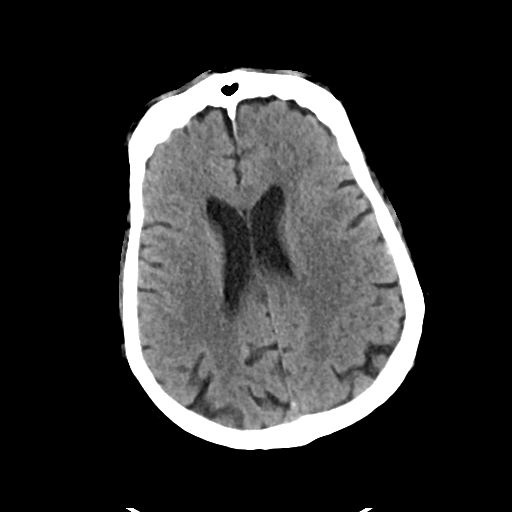
[im 26/41  bone]
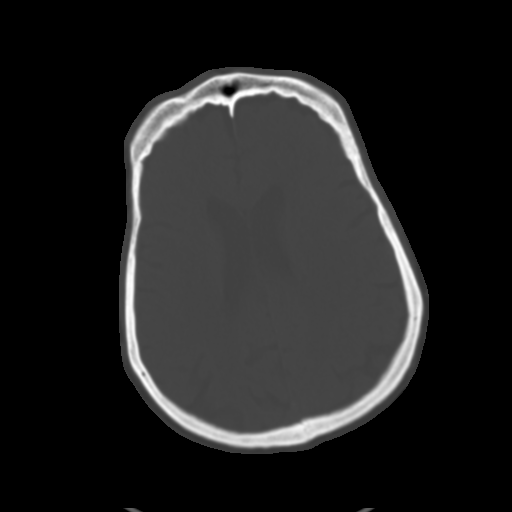
[im 31/41  brain]
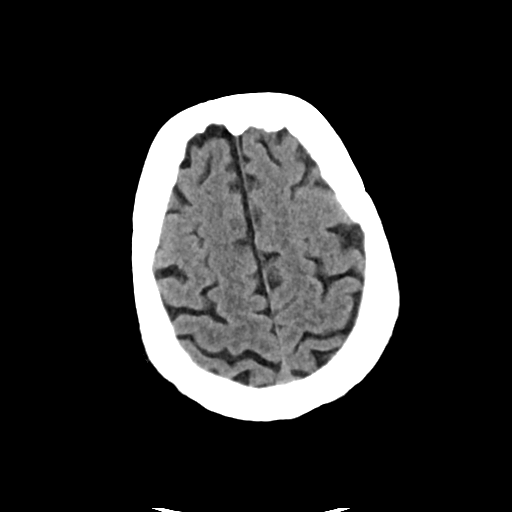
[im 36/41  brain]
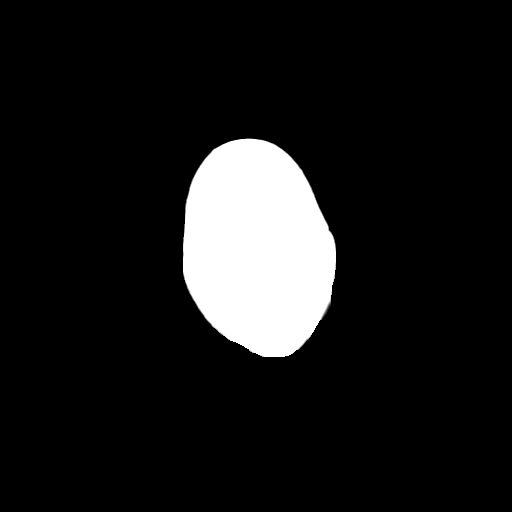

[Series 4: head bone · axial · 0.47mm/px · z∈[+1000,+1070]mm · 4 of 103 slices shown]
[im 11/103  bone]
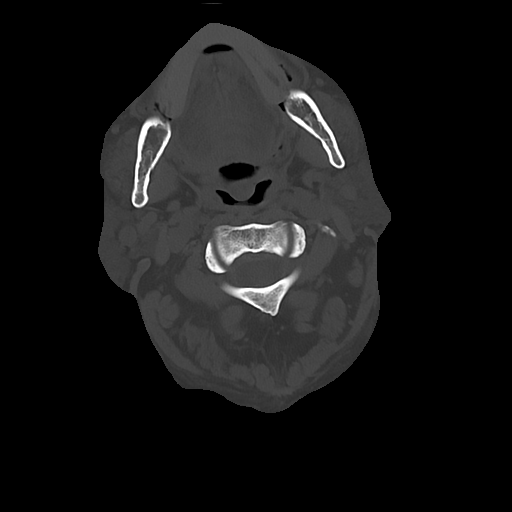
[im 21/103  bone]
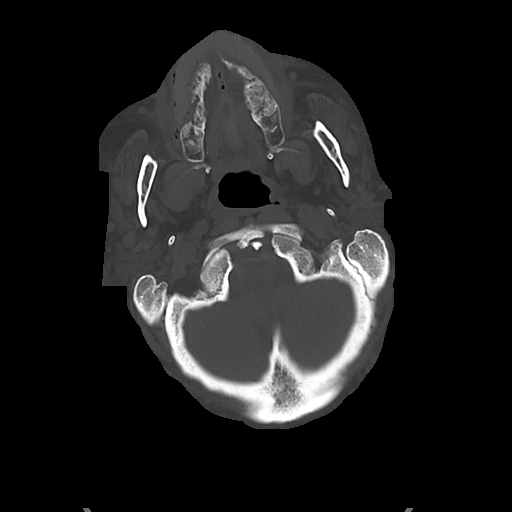
[im 31/103  bone]
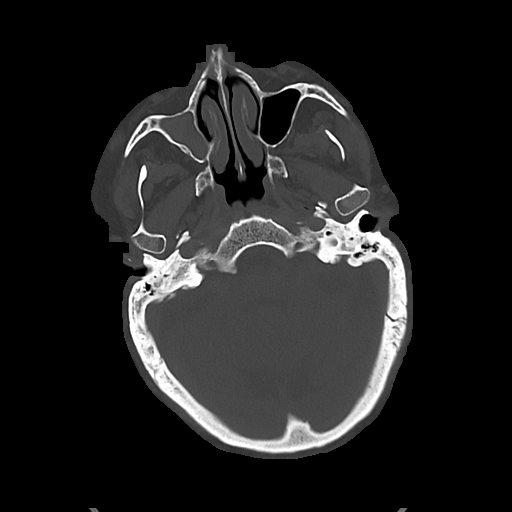
[im 46/103  bone]
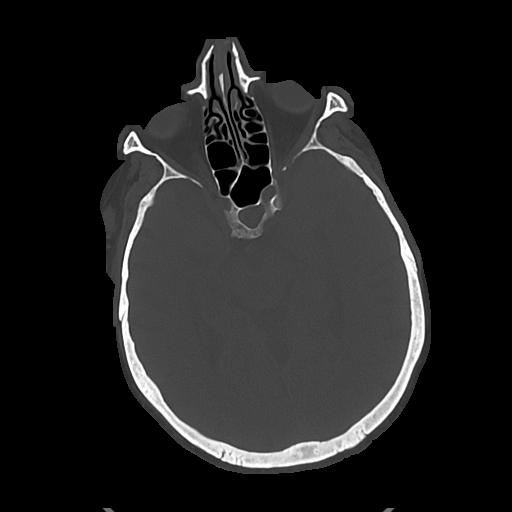

[Series 5: cor soft · coronal · 0.38mm/px · 3 of 73 slices shown]
[im 25/73  brain]
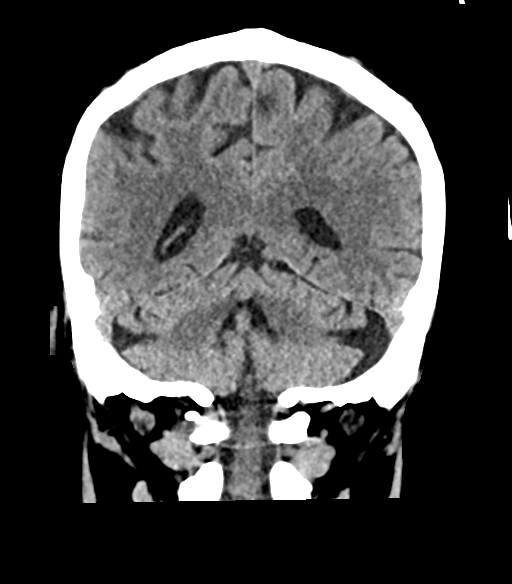
[im 33/73  brain]
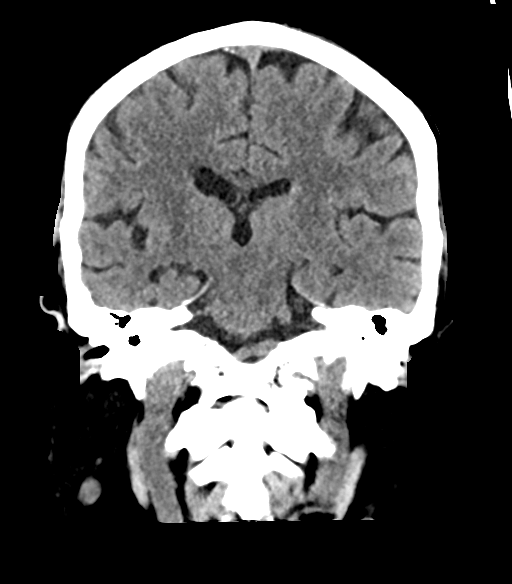
[im 41/73  brain]
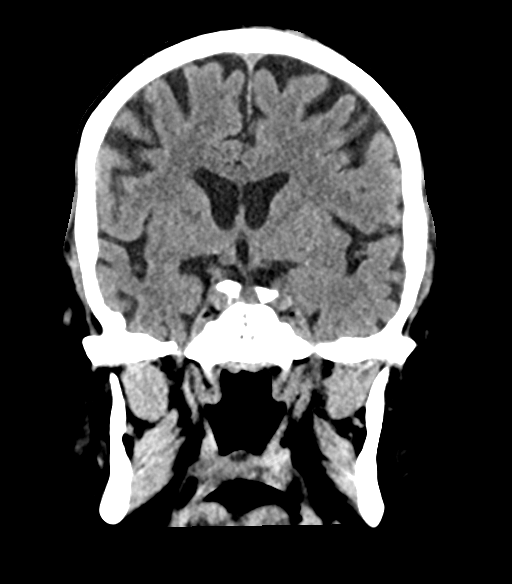

[Series 6: sag soft · sagittal · 0.42mm/px · 3 of 62 slices shown]
[im 21/62  brain]
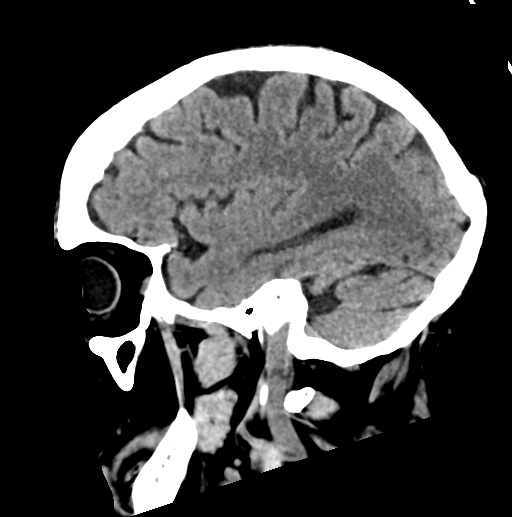
[im 31/62  brain]
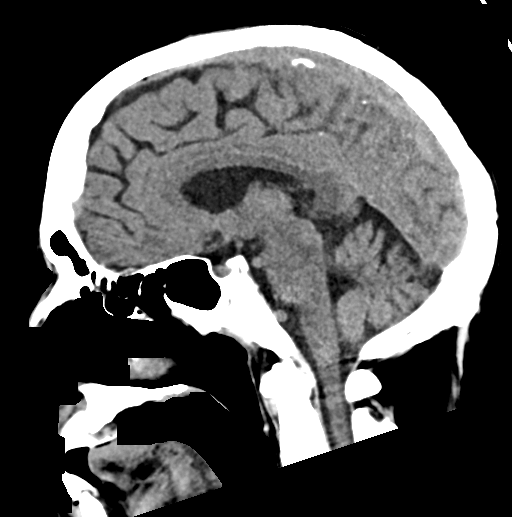
[im 41/62  brain]
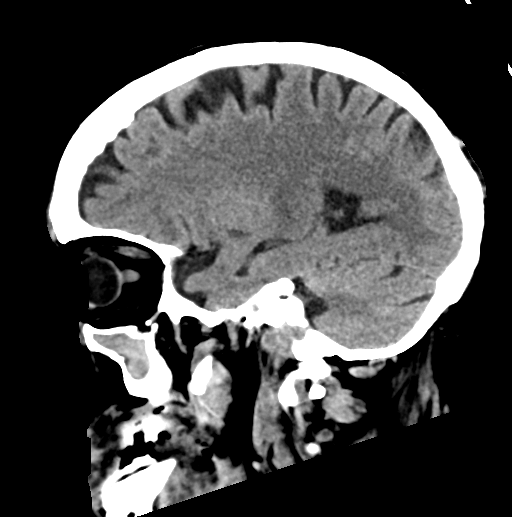

[17 of 47 positions shown; findings below may reference images not displayed]

FINDINGS: Brain: No acute infarct or hemorrhage. Lateral ventricles and
midline structures are unremarkable. No acute extra-axial fluid
collections. No mass effect.

Vascular: No hyperdense vessel or unexpected calcification.

Skull: Normal. Negative for fracture or focal lesion.

Sinuses/Orbits: Opacification of the right maxillary sinus.
Remaining paranasal sinuses are clear.

Other: None.
IMPRESSION: 1. No acute intracranial process.
2. Right maxillary sinus disease.

## 2021-06-17 MED ORDER — INSULIN ASPART 100 UNIT/ML IV SOLN
5.0000 [IU] | Freq: Once | INTRAVENOUS | Status: AC
  Administered 2021-06-18: 5 [IU] via INTRAVENOUS

## 2021-06-17 MED ORDER — LACTATED RINGERS IV SOLN: INTRAVENOUS | Status: DC

## 2021-06-17 MED ORDER — ACETAMINOPHEN 325 MG PO TABS: 650.0000 mg | ORAL_TABLET | Freq: Four times a day (QID) | ORAL | Status: DC | PRN

## 2021-06-17 MED ORDER — SODIUM ZIRCONIUM CYCLOSILICATE 10 G PO PACK
10.0000 g | PACK | Freq: Once | ORAL | Status: AC
  Administered 2021-06-18: 10 g via ORAL
  Filled 2021-06-17: qty 1

## 2021-06-17 MED ORDER — ACETAMINOPHEN 650 MG RE SUPP: 650.0000 mg | Freq: Four times a day (QID) | RECTAL | Status: DC | PRN

## 2021-06-17 MED ORDER — DEXTROSE 50 % IV SOLN
1.0000 | Freq: Once | INTRAVENOUS | Status: AC
  Administered 2021-06-18: 50 mL via INTRAVENOUS
  Filled 2021-06-17: qty 50

## 2021-06-17 NOTE — ED Provider Notes (Signed)
[facility] EMERGENCY DEPARTMENT Provider Note   CSN: [id number] Arrival date & time: 06/04/2021  2043     History Chief Complaint  Patient presents with   Scott Woods is a 70 y.o. male.  With past medical of CKD stage IV, diabetes, hypertension, hyperlipidemia who presents emergency department after fall.  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  Unknown fall from height but he thinks it is at least 10 feet.  Positive loss of consciousness.  Reported incontinence of urine.  He states that he does not remember the event.  He is unable to tell me if he was lightheaded, dizzy, having chest pain, or any other symptoms prior to this fall.  On interview he states that he is having pain on the left ribs.  Denies headache, visual changes, nausea or vomiting, spinal tenderness, pain to his upper or lower extremities, chest pain, palpitations, abdominal pain.  He is unaware that he was incontinent of urine.  He denies seizure history.  Not anticoagulated.  Fall Pertinent negatives include no chest pain, no abdominal pain, no headaches and no shortness of breath.   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/08/2015   When he was a child    History of hepatitis B    HTN (hypertension)    Hyperlipidemia with target LDL less than 100    Obesity    Pre-ulcerative calluses 03/28/2016   great toe    Prostate cancer [facility])     Patient Active Problem List   Diagnosis Date Noted   Stage 4 chronic kidney disease ([place]) 06/03/2021   Hypocalcemia 02/17/2021   Increased anion gap metabolic acidosis 32/35/5732   Abnormal chest CT 02/12/2021   BPH  with obstruction/lower urinary tract symptoms 04/01/2019   Type 2 diabetes mellitus with microalbuminuria, without long-term current use of insulin ([place]) 11/16/2017   Adrenal adenoma 05/08/2015   Abnormal prostate specific antigen 05/08/2015   Hyperlipidemia LDL goal <100 05/08/2015   Hypertension, goal below 140/90 05/08/2015   Diabetes mellitus type 2, controlled, without complications ([place]) 20/25/4270    Past Surgical History:  Procedure Laterality Date   ADENOIDECTOMY     COLONOSCOPY  11/18/2014   PROCTOSCOPY  04/2014   TONSILLECTOMY         Family History  Problem Relation Age of Onset   Parkinson's disease Brother    Heart disease Mother    Diabetes Mellitus II Mother    Coronary artery disease Mother    Heart disease Father    Prostate cancer Neg Hx    Kidney cancer Neg Hx    Bladder Cancer Neg Hx     Social History   Tobacco Use   Smoking status: Never   Smokeless tobacco: Never   Tobacco comments:    Smoking cessation materials not required  Vaping Use   Vaping Use: Never used  Substance Use Topics   Alcohol use: No    Alcohol/week: 0.0 standard drinks   Drug use: No    Home Medications Prior to Admission medications   Medication Sig  Start Date End Date Taking? Authorizing Provider  atorvastatin (LIPITOR) 40 MG tablet TAKE 1 TABLET BY MOUTH EVERYDAY AT BEDTIME Patient taking differently: Take 40 mg by mouth daily. TAKE 1 TABLET BY MOUTH EVERYDAY AT BEDTIME 10/02/20   [name], PA-C  finasteride (PROSCAR) 5 MG tablet TAKE 1 TABLET BY MOUTH EVERY DAY Patient taking differently: Take 5 mg by mouth daily. 03/10/20   Zara Council A, PA-C  lisinopril (ZESTRIL) 10 MG tablet Take 10 mg by mouth daily. 03/26/21   [provider]  tamsulosin (FLOMAX) 0.4 MG CAPS capsule Take 1 capsule (0.4 mg total) by mouth daily after supper. 04/01/21   Zara Council A, PA-C    Allergies    Patient has no known allergies.  Review of Systems   Review of  Systems  Constitutional:  Negative for diaphoresis.  Respiratory:  Negative for shortness of breath.   Cardiovascular:  Negative for chest pain and palpitations.  Gastrointestinal:  Negative for abdominal pain, nausea and vomiting.  Musculoskeletal:  Positive for myalgias.  Neurological:  Negative for dizziness, seizures, light-headedness and headaches.  All other systems reviewed and are negative.  Physical Exam Updated Vital Signs BP 132/77   Pulse (!) 109   Resp 16   Ht 5\' 10"  (1.778 m)   Wt 68 kg   SpO2 100%   BMI 21.52 kg/m   Physical Exam Vitals and nursing note reviewed.  Constitutional:      General: He is not in acute distress.    Appearance: Normal appearance. He is not toxic-appearing.  HENT:     Head: Normocephalic. Abrasion present.      Comments: 6 cm abrasion to the parietal head without laceration or active bleeding.    Nose: Nose normal.     Mouth/Throat:     Mouth: Mucous membranes are moist.     Dentition: Normal dentition.     Tongue: No lesions.     Pharynx: Oropharynx is clear. Uvula midline.  Eyes:     General: Vision grossly intact. Gaze aligned appropriately. No scleral icterus.    Extraocular Movements: Extraocular movements intact.     Conjunctiva/sclera: Conjunctivae normal.     Pupils: Pupils are equal, round, and reactive to light.  Neck:     Comments: C-collar in place  Cardiovascular:     Rate and Rhythm: Regular rhythm. Tachycardia present.     Pulses: Normal pulses.     Heart sounds: Normal heart sounds. No murmur heard. Pulmonary:     Effort: Pulmonary effort is normal. No respiratory distress.     Breath sounds: Normal breath sounds.  Chest:     Chest wall: No deformity or tenderness.  Abdominal:     General: Abdomen is flat. Bowel sounds are normal. There is no distension.     Palpations: Abdomen is soft.     Tenderness: There is no abdominal tenderness.  Musculoskeletal:        General: No swelling, tenderness, deformity or  signs of injury. Normal range of motion.     Cervical back: No tenderness. No spinous process tenderness or muscular tenderness.     Right lower leg: No edema.     Left lower leg: No edema.  Skin:    General: Skin is warm and dry.     Capillary Refill: Capillary refill takes less than 2 seconds.     Coloration: Skin is pale.  Neurological:     General: No focal deficit present.     Mental Status:  He is alert and oriented to person, place, and time. Mental status is at baseline.     Cranial Nerves: No cranial nerve deficit.     Sensory: Sensation is intact. No sensory deficit.     Motor: Motor function is intact. No weakness.  Psychiatric:        Mood and Affect: Mood normal.        Behavior: Behavior normal.        Thought Content: Thought content normal.        Judgment: Judgment normal.    ED Results / Procedures / Treatments   Labs (all labs ordered are listed, but only abnormal results are displayed) Labs Reviewed  COMPREHENSIVE METABOLIC PANEL - Abnormal; Notable for the following components:      Result Value   Potassium 6.3 (*)    Glucose, Bld 124 (*)    BUN 44 (*)    Creatinine, Ser 6.23 (*)    Albumin 3.1 (*)    GFR, Estimated 9 (*)    All other components within normal limits  CBC WITH DIFFERENTIAL/PLATELET - Abnormal; Notable for the following components:   RBC 2.56 (*)    Hemoglobin 8.2 (*)    HCT 25.6 (*)    All other components within normal limits  LACTIC ACID, PLASMA  URINALYSIS, ROUTINE W REFLEX MICROSCOPIC  LACTIC ACID, PLASMA  GLUCOSE, RANDOM  CBG MONITORING, ED  TROPONIN I (HIGH SENSITIVITY)  TROPONIN I (HIGH SENSITIVITY)   EKG EKG Interpretation  Date/Time:  Thursday June 17 2021 22:44:16 EDT Ventricular Rate:  107 PR Interval:  189 QRS Duration: 97 QT Interval:  320 QTC Calculation: 427 R Axis:   -28 Text Interpretation: Sinus tachycardia Borderline left axis deviation Low voltage, extremity leads Abnormal R-wave progression, early  transition Sinus tachycardia Confirmed by Lavenia Atlas [phone]) on 06/06/2021 10:55:00 PM  Radiology CT Abdomen Pelvis Wo Contrast  Result Date: 06/06/2021 CLINICAL DATA:  Abdominal trauma EXAM: CT ABDOMEN AND PELVIS WITHOUT CONTRAST TECHNIQUE: Multidetector CT imaging of the abdomen and pelvis was performed following the standard protocol without IV contrast. COMPARISON:  None. FINDINGS: LOWER CHEST: Normal. HEPATOBILIARY: Normal hepatic contours. No intra- or extrahepatic biliary dilatation. There is cholelithiasis without acute inflammation. PANCREAS: Normal pancreas. No ductal dilatation or peripancreatic fluid collection. SPLEEN: Normal. ADRENALS/URINARY TRACT: Unchanged appearance of small left adrenal adenoma. No hydronephrosis, nephroureterolithiasis or solid renal mass. The urinary bladder is normal for degree of distention STOMACH/BOWEL: There is no hiatal hernia. Normal duodenal course and caliber. No small bowel dilatation or inflammation. No focal colonic abnormality. Normal appendix. VASCULAR/LYMPHATIC: There is calcific atherosclerosis of the abdominal aorta. No lymphadenopathy. REPRODUCTIVE: Enlarged prostate measures 5.0 cm in transverse dimension. MUSCULOSKELETAL. No bony spinal canal stenosis or focal osseous abnormality. OTHER: None. IMPRESSION: 1. No acute abnormality of the abdomen or pelvis. 2. Unchanged appearance of small left adrenal adenoma. 3. Cholelithiasis without acute inflammation. Aortic Atherosclerosis (ICD10-I70.0). Electronically Signed   By: Ulyses Jarred M.D.   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 No acute bone abnormality. Lungs are clear. No pneumothorax or pleural effusion. Cardiac size within normal limits. No  superior mediastinal widening. Pulmonary vascularity is normal. IMPRESSION: No acute abnormality. Electronically Signed   By: Fidela Salisbury M.D.   On:  06/09/2021 22:17   CT Head Wo Contrast  Result Date: 05/21/2021 CLINICAL DATA:  Head and neck trauma, fell EXAM: CT HEAD WITHOUT CONTRAST TECHNIQUE: Contiguous axial images were obtained from the base of the skull through the vertex without intravenous contrast. COMPARISON:  10/10/2019 FINDINGS: Brain: No acute infarct or hemorrhage. Lateral ventricles and midline structures are unremarkable. No acute extra-axial fluid collections. No mass effect. Vascular: No hyperdense vessel or unexpected calcification. Skull: Normal. Negative for fracture or focal lesion. Sinuses/Orbits: Opacification of the right maxillary sinus. Remaining paranasal sinuses are clear. Other: None. IMPRESSION: 1. No acute intracranial process. 2. Right maxillary sinus disease. Electronically Signed   By: Randa Ngo M.D.   On: 06/05/2021 22:29   CT Cervical Spine Wo Contrast  Result Date: 05/21/2021 CLINICAL DATA:  Head and neck trauma, fell EXAM: CT CERVICAL SPINE WITHOUT CONTRAST TECHNIQUE: Multidetector CT imaging of the cervical spine was performed without intravenous contrast. Multiplanar CT image reconstructions were also generated. COMPARISON:  10/10/2019 FINDINGS: Alignment: Alignment is anatomic. Skull base and vertebrae: No acute fracture. No primary bone lesion or focal pathologic process. Soft tissues and spinal canal: No prevertebral fluid or swelling. No visible canal hematoma. Disc levels: Mild diffuse cervical spondylosis most pronounced at C4-5. Upper chest: Airway is patent.  Lung apices are clear. Other: Reconstructed images demonstrate no additional findings. IMPRESSION: 1. No acute cervical spine fracture. 2. Stable cervical spondylosis. Electronically Signed   By: Randa Ngo M.D.   On: 06/10/2021 22:34    Procedures .Critical Care Performed by: Mickie Hillier, PA-C Authorized by: Mickie Hillier, PA-C   Critical care provider statement:    Critical care time (minutes):  40   Critical care was necessary to treat or prevent imminent or life-threatening deterioration of the following conditions:  Renal failure   Critical care was time spent personally by me on the following activities:  Ordering and performing treatments and interventions, ordering and review of radiographic studies, ordering and review of laboratory studies, re-evaluation of patient's condition, review of old charts, obtaining history from patient or surrogate, examination of patient, evaluation of patient's response to treatment, discussions with consultants and development of treatment plan with patient or surrogate   Care discussed with: admitting provider     Medications Ordered in ED Medications  insulin aspart (novoLOG) injection 5 Units (has no administration in time range)    And  dextrose 50 % solution 50 mL (has no administration in time range)    ED Course  I have reviewed the triage vital signs and the nursing notes.  Pertinent labs & imaging results that were available during my care of the patient were reviewed by me and considered in my medical decision making (see chart for details).  MDM Rules/Calculators/A&P 70 year old male who presents emergency department after fall.  Witnessed fall from referee chair of unknown height, however he states it may be over 10 feet.  Had positive loss of consciousness.  He is unable to tell me if he had any symptoms prior to fall, or if he syncopized prior to falling.  He did have incontinence of urine.  Because of high to fall received CT head, C-spine, abdomen pelvis.  All imaging was negative.  He also complained of pain over the left ribs.  X-ray of his chest negative for acute fractures. Lactic 0.9, no postictal period and no witnessed seizure-like activity, so doubt new onset seizure. Troponin x1 negative, EKG  without ischemia or infarction, no chest pain so  doubt ACS as origin of event.  CMP with creatinine of 6.23, potassium of 6.3.  It is up significantly from 3 months ago where his creatinine was 2.7 without electrolyte derangement.  I do see in his previous primary care note on 06/03/2021 that there was a referral to nephrology for urgent follow-up.  Patient states he has not seen a nephrologist yet.  EKG hyperkalemia changes. Did not obtain CK as he has known amount of downtime and do not think there is any component of rhabdomyolysis in his acute on chronic renal failure.  Suspicious that renal failure with electrolyte derangement being because of fall. 2310: Spoke with Dr. Joelyn Oms with nephrology who agrees to follow the patient while he is admitted.  Advises to temporize his potassium here in the emergency department.  I have ordered 5 units of NovoLog with dextrose and will reevaluate his glucose and potassium. 2320: Spoke with Dr. Nadara Mustard, hospitalist, who agrees to admit the patient at this time.  Final Clinical Impression(s) / ED Diagnoses Final diagnoses:  Fall, initial encounter  Acute renal failure superimposed on stage 4 chronic kidney disease, unspecified acute renal failure type ([place])  Hyperkalemia    Rx / DC Orders ED Discharge Orders     None        Mickie Hillier, PA-C 06/13/2021 [id number]    Horton, Alvin Critchley, DO 06/18/21 1532

## 2021-06-17 NOTE — ED Notes (Signed)
Critical value from lab. Potassium is 6.3 with no hemolysis. Dr Dina Rich made aware.

## 2021-06-17 NOTE — H&P (Signed)
History and Physical    PLEASE NOTE THAT DRAGON DICTATION SOFTWARE WAS USED IN THE CONSTRUCTION OF THIS NOTE.   Scott Woods UTM:[id number] DOB: [birth year] DOA: 06/04/2021  PCP: [name], PA-C Patient coming from: home   I have personally briefly reviewed patient's old medical records in [place]  Chief Complaint: Fall  HPI: Scott Woods is a 70 y.o. male with medical history significant for stage IV chronic kidney disease, essential hypertension, hyperlipidemia, BPH, chronic anemia with baseline hemoglobin 8-11, who is admitted to [facility] on 06/07/2021 with acute renal failure superimposed on stage IV chronic kidney disease after presenting from home to [facility] ED for evaluation of fall.   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  He reports that he was completely asymptomatic leading up to this fall, and experienced no preceding sensation of dizziness, lightheadedness, diaphoresis, or subjective sensation of impending loss of consciousness. denies any recent, immediately preceding, or ensuing chest pain, sob, palpitations, diaphoresis, nausea, vomiting dizziness.  Denies any prior history of syncopal event.    The above episode was reportedly witnessed by this observing the volleyball game, and the patient reportedly was noted to have an episode of urinary incontinence upon falling to the court, in the absence of any associated fecal incontinence, tongue biting, tremors, or tonic-clonic activity.  It appears that he remained unconscious for only a few seconds before regaining consciousness without any associated confusion.  Denies any history of seizures.  It is unclear if patient hit his head as a component of this.,  But currently denies any associated  headache or neck pain.  He also denies any associated cute focal weakness, acute focal numbness, paresthesias, facial droop, slurred speech, expressive aphasia, acute change in vision, dysphagia, vertigo.  He conveys some mild left lateral chest wall discomfort that is completely reproducible with palpation over this area.  Otherwise denies any significant acute arthralgias or myalgias.   Denies any recent subjective fever, chills, rigors, or generalized myalgias.  No recent neck stiffness, rhinitis, rhinorrhea, sore throat, wheezing, cough, abdominal pain, or rash.  No recent traveling or known COVID-19 exposures. No recent worsening of peripheral edema, nor any calf tenderness, or new lower extremity erythema.  Denies any recent hemoptysis.  Not associated with any recent dysuria, gross hematuria, or change in urinary urgency/frequency.  Medical history notable for essential hypertension, for which the patient is on lisinopril.  He also has a documented history of BPH for which he is on finasteride as well as Flomax.  He also has a history of stage IV chronic kidney disease with apparent baseline creatinine of approximately 2.7, with most recent prior serum creatinine value noted to be 2.76 on 02/19/2021.  The patient reports that he was supposed to have a outpatient nephrology consult in the setting of his stage IV CKD, as he is reportedly not previously established with nephrology, but this has not yet occurred.   Patient denies any recent dysuria or gross hematuria, and has noted no significant change in urinary output recently.     ED Course:  Vital signs in the ED were notable for the following:  - Heart rate 109 110; blood pressure 132/77  Labs were notable for the following:  148/78; respiratory rate 16, oxygen saturation 100% on room  air.  Sodium 137, potassium 6.3, bicarbonate 22, anion gap 9, BUN 44, creatinine 6.23, glucose 124, calcium corrected for mild hypoalbuminemia noted to be 10.3,  albumin 3.1, otherwise liver enzymes were found to be within normal limits.  Initial high-sensitivity troponin I found to be 5.  CBC notable for hemoglobin 8.2 compared to most recent prior value of 9.2 on 6-22 and was associated with normocytic/normochromic findings as well as nonelevated RDW.  Urinalysis with microscopy has been ordered, with result currently pending.  Screening COVID-19/onset PCR were checked in the emergency department this evening, with result currently pending.  Imaging and additional notable ED work-up: EKG showed sinus tachycardia with heart rate 107, normal intervals, and no evidence of T wave or ST changes, including no evidence of ST elevation.  Chest x-ray showed no evidence of acute cardiopulmonary process, including no evidence of infiltrate, edema, effusion, or pneumothorax.  Noncontrast CT that showed no evidence of acute intracranial process, including no evidence of intracranial hemorrhage.  CT cervical spine showed no acute cervical spine fracture.  CT abdomen/pelvis without contrast showed no evidence of acute abdominal/pelvic abnormality, including no hydronephrosis, ureteral nephrosis, no nephro-ureterolithiasis, and demonstrated a normal-appearing bladder.  The EDP discussed the patient's case with the on-call nephrologist, Dr. Joelyn Oms, who will consult. Dr. Joelyn Oms did not feel that urgent/emergent hemodialysis was indicated at this time.   While in the ED, the following were administered: NovoLog 5 units IV x1 as well as 1 amp of D50.  Subsequently, the patient was admitted to the PCU for further evaluation management of acute renal failure superimposed on stage IV CKD, complicated by hyper kalemia in the context of suspected presenting syncope.     Review of Systems: As per HPI otherwise 10 point review of systems negative.   Past Medical History:  Diagnosis Date   Adrenal nodule ([place])    Blood in the urine 05/08/2015   Borderline diabetes    Chronic kidney  disease    CKD (chronic kidney disease), stage II    Diabetes mellitus without complication (HCC)    Elevated PSA    Gross hematuria    H/O type B viral hepatitis 05/08/2015   When he was a child    History of hepatitis B    HTN (hypertension)    Hyperlipidemia with target LDL less than 100    Obesity    Pre-ulcerative calluses 03/28/2016   great toe    Prostate cancer [facility])     Past Surgical History:  Procedure Laterality Date   ADENOIDECTOMY     COLONOSCOPY  11/18/2014   PROCTOSCOPY  04/2014   TONSILLECTOMY      Social History:  reports that he has never smoked. He has never used smokeless tobacco. He reports that he does not drink alcohol and does not use drugs.   No Known Allergies  Family History  Problem Relation Age of Onset   Parkinson's disease Brother    Heart disease Mother    Diabetes Mellitus II Mother    Coronary artery disease Mother    Heart disease Father    Prostate cancer Neg Hx    Kidney cancer Neg Hx    Bladder Cancer Neg Hx     Family history reviewed and not pertinent    Prior to Admission medications   Medication Sig Start Date End Date Taking? Authorizing Provider  atorvastatin (LIPITOR) 40 MG tablet TAKE 1 TABLET BY MOUTH EVERYDAY AT BEDTIME Patient taking differently: Take 40 mg by mouth  at bedtime. 10/02/20  Yes [name], PA-C  finasteride (PROSCAR) 5 MG tablet TAKE 1 TABLET BY MOUTH EVERY DAY Patient taking differently: Take 5 mg by mouth at bedtime. 03/10/20  Yes McGowan, Larene Beach A, PA-C  lisinopril (ZESTRIL) 10 MG tablet Take 10 mg by mouth at bedtime. 03/26/21  Yes [provider]  tamsulosin (FLOMAX) 0.4 MG CAPS capsule Take 1 capsule (0.4 mg total) by mouth daily after supper. 04/01/21  Yes Nori Riis, PA-C     Objective    Physical Exam: Vitals:   06/01/2021 2049 06/10/2021 2051 05/26/2021 2100 06/05/2021 2130  BP:   132/77 (!) 148/78  Pulse:   (!) 109 (!) 110  Resp:   16 16  SpO2: 100%  100% 100%  Weight:  68  kg    Height:  5\' 10"  (1.778 m)      General: appears to be stated age; alert, oriented Skin: warm, dry, no rash Head:  AT/[place] Mouth:  Oral mucosa membranes appear moist, normal dentition Neck: supple; trachea midline Heart:  RRR; did not appreciate any M/R/G Lungs: CTAB, did not appreciate any wheezes, rales, or rhonchi Abdomen: + BS; soft, ND, NT Vascular: 2+ pedal pulses b/l; 2+ radial pulses b/l Extremities: no peripheral edema, no muscle wasting Neuro: strength and sensation intact in upper and lower extremities b/l   Labs on Admission: I have personally reviewed following labs and imaging studies  CBC: Recent Labs  Lab 05/27/2021 2100  WBC 8.8  NEUTROABS 7.0  HGB 8.2*  HCT 25.6*  MCV 100.0  PLT 185   Basic Metabolic Panel: Recent Labs  Lab 06/07/2021 2100  NA 137  K 6.3*  CL 106  CO2 22  GLUCOSE 124*  BUN 44*  CREATININE 6.23*  CALCIUM 9.6   GFR: Estimated Creatinine Clearance: 10.8 mL/min (A) (by C-G formula based on SCr of 6.23 mg/dL (H)). Liver Function Tests: Recent Labs  Lab 05/20/2021 2100  AST 16  ALT 18  ALKPHOS 66  BILITOT 0.5  PROT 6.5  ALBUMIN 3.1*   No results for input(s): LIPASE, AMYLASE in the last 168 hours. No results for input(s): AMMONIA in the last 168 hours. Coagulation Profile: No results for input(s): INR, PROTIME in the last 168 hours. Cardiac Enzymes: No results for input(s): CKTOTAL, CKMB, CKMBINDEX, TROPONINI in the last 168 hours. BNP (last 3 results) No results for input(s): PROBNP in the last 8760 hours. HbA1C: No results for input(s): HGBA1C in the last 72 hours. CBG: No results for input(s): GLUCAP in the last 168 hours. Lipid Profile: No results for input(s): CHOL, HDL, LDLCALC, TRIG, CHOLHDL, LDLDIRECT in the last 72 hours. Thyroid Function Tests: No results for input(s): TSH, T4TOTAL, FREET4, T3FREE, THYROIDAB in the last 72 hours. Anemia Panel: No results for input(s): VITAMINB12, FOLATE, FERRITIN, TIBC, IRON,  RETICCTPCT in the last 72 hours. Urine analysis:    Component Value Date/Time   COLORURINE YELLOW (A) 02/11/2021 2104   APPEARANCEUR Clear 04/01/2021 1406   LABSPEC 1.012 02/11/2021 2104   LABSPEC 1.027 08/09/2014 1559   PHURINE 5.0 02/11/2021 2104   GLUCOSEU Trace (A) 04/01/2021 1406   GLUCOSEU see comment 08/09/2014 1559   HGBUR NEGATIVE 02/11/2021 2104   BILIRUBINUR Negative 04/01/2021 1406   BILIRUBINUR see comment 08/09/2014 [place] 02/11/2021 2104   PROTEINUR 1+ (A) 04/01/2021 1406   PROTEINUR NEGATIVE 02/11/2021 2104   UROBILINOGEN 0.2 02/20/2017 1615   NITRITE Negative 04/01/2021 1406   NITRITE NEGATIVE 02/11/2021 2104  LEUKOCYTESUR Negative 04/01/2021 [place] 02/11/2021 2104   LEUKOCYTESUR see comment 08/09/2014 1559    Radiological Exams on Admission: CT Abdomen Pelvis Wo Contrast  Result Date: 06/03/2021 CLINICAL DATA:  Abdominal trauma EXAM: CT ABDOMEN AND PELVIS WITHOUT CONTRAST TECHNIQUE: Multidetector CT imaging of the abdomen and pelvis was performed following the standard protocol without IV contrast. COMPARISON:  None. FINDINGS: LOWER CHEST: Normal. HEPATOBILIARY: Normal hepatic contours. No intra- or extrahepatic biliary dilatation. There is cholelithiasis without acute inflammation. PANCREAS: Normal pancreas. No ductal dilatation or peripancreatic fluid collection. SPLEEN: Normal. ADRENALS/URINARY TRACT: Unchanged appearance of small left adrenal adenoma. No hydronephrosis, nephroureterolithiasis or solid renal mass. The urinary bladder is normal for degree of distention STOMACH/BOWEL: There is no hiatal hernia. Normal duodenal course and caliber. No small bowel dilatation or inflammation. No focal colonic abnormality. Normal appendix. VASCULAR/LYMPHATIC: There is calcific atherosclerosis of the abdominal aorta. No lymphadenopathy. REPRODUCTIVE: Enlarged prostate measures 5.0 cm in transverse dimension. MUSCULOSKELETAL. No bony  spinal canal stenosis or focal osseous abnormality. OTHER: None. IMPRESSION: 1. No acute abnormality of the abdomen or pelvis. 2. Unchanged appearance of small left adrenal adenoma. 3. Cholelithiasis without acute inflammation. Aortic Atherosclerosis (ICD10-I70.0). Electronically Signed   By: Ulyses Jarred M.D.   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 No acute bone abnormality. Lungs are clear. No pneumothorax or pleural effusion. Cardiac size within normal limits. No superior mediastinal widening. Pulmonary vascularity is normal. IMPRESSION: No acute abnormality. Electronically Signed   By: Fidela Salisbury M.D.   On: 05/20/2021 22:17   CT Head Wo Contrast  Result Date: 06/04/2021 CLINICAL DATA:  Head and neck trauma, fell EXAM: CT HEAD WITHOUT CONTRAST TECHNIQUE: Contiguous axial images were obtained from the base of the skull through the vertex without intravenous contrast. COMPARISON:  10/10/2019 FINDINGS: Brain: No acute infarct or hemorrhage. Lateral ventricles and midline structures are unremarkable. No acute extra-axial fluid collections. No mass effect. Vascular: No hyperdense vessel or unexpected calcification. Skull: Normal. Negative for fracture or focal lesion. Sinuses/Orbits: Opacification of the right maxillary sinus. Remaining paranasal sinuses are clear. Other: None. IMPRESSION: 1. No acute intracranial process. 2. Right maxillary sinus disease. Electronically Signed   By: Randa Ngo M.D.   On: 05/26/2021 22:29   CT Cervical Spine Wo Contrast  Result Date: 05/20/2021 CLINICAL DATA:  Head and neck trauma, fell EXAM: CT CERVICAL SPINE WITHOUT CONTRAST TECHNIQUE: Multidetector CT imaging of the cervical spine was performed without  intravenous contrast. Multiplanar CT image reconstructions were also generated. COMPARISON:  10/10/2019 FINDINGS: Alignment: Alignment is anatomic. Skull base and vertebrae: No acute fracture. No primary bone lesion or focal pathologic process. Soft tissues and spinal canal: No prevertebral fluid or swelling. No visible canal hematoma. Disc levels: Mild diffuse cervical spondylosis most pronounced at C4-5. Upper chest: Airway is patent.  Lung apices are clear. Other: Reconstructed images demonstrate no additional findings. IMPRESSION: 1. No acute cervical spine fracture. 2. Stable cervical spondylosis. Electronically Signed   By: Randa Ngo M.D.   On: 06/16/2021 22:34     EKG: Independently reviewed, with result as described above.    Assessment/Plan    Scott Woods is a 70 y.o. male with medical history significant for stage IV chronic kidney disease, essential  hypertension, hyperlipidemia, BPH, chronic anemia with baseline hemoglobin 8-11, who is admitted to [facility] on 06/13/2021 with acute renal failure superimposed on stage IV chronic kidney disease after presenting from home to [facility] ED for evaluation of fall.    Principal Problem:   ARF (acute renal failure) (HCC) Active Problems:   Hyperlipidemia LDL goal <100   Hypertension, goal below 140/90   Hyperkalemia   Syncope   Anemia due to chronic kidney disease     #) Acute renal failure superimposed on stage IV CKD: In the setting of history of stage IV CKD, with apparent baseline creatinine of approximately 2.7, with most recent prior serum creatinine noted to be 2.76 on 02/19/2021, presenting serum creatinine found to be 6.23.  This appears to be associated with mild hyper kalemia, as further detailed below, but without associated evidence of uremia, anion gap metabolic acidosis, or evidence of acute volume overload.  Unclear etiology leading to this exacerbation of the patient's CKD.  Of note, he is on lisinopril as an  outpatient, which will be held for now. Will also check CPK level given traumatic fall that occurred earlier today.  Urinalysis with microscopy has been ordered, with result currently pending.  Of note, CT abdomen/pelvis performed today showed no evidence of postrenal obstructive process, as further detailed above. patient's case and imaging were discussed with the on-call nephrologist, Dr. Joelyn Oms, who will consult. Dr. Joelyn Oms did not feel that urgent/emergent hemodialysis was indicated at this time.     Plan: Follow-up for results of the urinalysis with microscopy, including attention for the presence of urinary casts.  Add on random urine sodium and random urine creatinine.  Add on CPK level, as above.  Continuous lactated Ringer's at 100 cc/h.  Monitor strict I's and O's Daily weights.  Tempt avoid nephrotoxic agents.  Hold home lisinopril for now.  Add on serum magnesium level and check serum phosphorus level.  Repeat CMP in the morning.  Nephrology consulted, as above.  Further evaluation management of presenting hyperkalemia, as further detailed above.         #) Hyperkalemia: presenting serum potassium level of 6.3, with lab work according no overt evidence of associated hemolysis.  This appears to be in the setting of acute renal failure superimposed on stage IV CKD, as above, with potential secondary pharmacologic exacerbation contribution from home lisinopril.  No evidence of associated peaked T waves on presenting EKG.  In the context of presenting acute renal failure with associated hyperkalemia, the patient's case was discussed with the on-call nephrologist, Dr.Sanford, Who will formally consult, does not feel that there is a current indication for urgent overnight hemodialysis.  Of note, the patient received 5 units of IV insulin along with an amp of D50 in the ED this evening.  We will also initiate a dose of Lokelma, as detailed below.  Given borderline hypercalcemia per presenting  serum calcium adjusted for hypoalbuminemia, will refrain from administration of calcium gluconate for now.  Does not appear to be associated with anion gap metabolic acidosis at this juncture, therefore we will refrain from serum bicarbonate administration for now, pending subsequent trend and acid-base status per repeat labs in the a.m., as detailed below.   Plan: Further evaluation management of presenting acute renal failure superimposed on stage IV CKD, as above, including continuous LR, as detailed above.  Monitor strict I's and O's and daily weights.  Lokelma 10 g p.o. x1 now.  Hold home lisinopril.  Refraining from calcium gluconate  administration for now, as above.  Not on serum magnesium level.  Repeat CMP in the morning.  Nephrology consulted, as further detailed above.  Monitor on telemetry.      #) Syncope: 1 episode of falling off volleyball referee chair, with preceding loss of consciousness before landing on the cord below, suspicious for episode of syncope without any associated prodrome.  There does not appear to be any positional element that would be classically associated with orthostatic hypotension, although the patient does appear to have been rotating his cervical neck, and will check bilateral carotid ultrasounds to further evaluate for any contribution from carotid stenosis.  In the context of suspected syncope without prodrome, with presenting Hyper kalemia, differential includes ventricular arrhythmia.  At this time, no overt evidence of ACS, with initial troponin nonelevated, presenting EKG showing no evidence of acute ischemic changes, in the absence of any associated chest pain.    Of note, suspected syncopal episode was reportedly associated with bladder incontinence, although no additional clinical features to suggest seizure, all in the context of no reported seizure history.  Will check prolactin level to further evaluate. Not associated with any overt acute focal  neurologic deficits to suggest acute ischemic stroke versus seizures appear less likely at this time.      Plan: I have placed a nursing communication order requesting that orthostatic vital signs x 1 set be checked and documented, following which will initiate gentle IV fluids in the form of LR 100 cc/hr. Monitor on telemetry.  Monitor strict I's and O's.  Add-on serum Mg level. Check CMP, CBC, serum Mg level in the AM.  Add on prolactin level, as above.  Check bilateral carotid ultrasound.  Continue to trend serial troponin.  Further evaluation management of presenting hyper kalemia, as further detailed above.      #) Essential hypertension: Documented history of such, on lisinopril as an outpatient.  In the context of presenting acute renal failure superimposed on stage IV CKD with hyperkalemia, will hold lisinopril for now.  Plan: Hold home lisinopril for now, as above.  Close monitoring of ensuing blood pressure via routine vital signs.  Further evaluation management of acute renal failure on CKD 4 as well as hyperkalemia, as further detailed above.       #) Hyperlipidemia: On high intensity atorvastatin as an outpatient.  Plan: Resume home atorvastatin.  Follow for result of CPK as component of evaluation of presenting acute renal failure, as above      #) BPH: On Flomax as well as finasteride.  No evidence of postrenal obstruction identified on today CT abdomen/pelvis, as further detailed above.  Plan: Monitor strict I's and O's and daily weights.  Continue home Flomax and finasteride.  Repeat CMP in the morning.      #) Chronic anemia: Associated with baseline hemoglobin range of 8-11, with presenting hemoglobin consistent with this range, and associated with normocytic/normochromic findings as well as nonelevated RDW, all consistent with contribution from anemia of CKD.  No evidence of active bleed at this time.  Plan: Repeat CBC in the morning.  Check INR.  Further  evaluation management of presenting acute renal failure superimposed on CKD 4, as above.     DVT prophylaxis: SCDs Code Status: Full code Family Communication: none Disposition Plan: Per Rounding Team Consults called: Dr. Joelyn Oms of nephrology consulted, as further detailed above;  Admission status: Inpatient; pcu     Of note, this patient was added by me to the following Admit List/Treatment  Team: [email or web address].    Of note, the Adult Admission Order Set (Multimorbid order set) was used by me in the admission process for this patient.   PLEASE NOTE THAT DRAGON DICTATION SOFTWARE WAS USED IN THE CONSTRUCTION OF THIS NOTE.   [place] [facility] Pager [phone] From [place]  Otherwise, please contact night-coverage  [email or web address] Password [facility]   06/14/2021, 11:39 PM

## 2021-06-17 NOTE — ED Triage Notes (Signed)
EMS reports patient fell app 10-12 feet to gym floor.  Positive loss of consciousness after fall.  Incontinent of urine prior to arrival.

## 2021-06-18 ENCOUNTER — Inpatient Hospital Stay (HOSPITAL_COMMUNITY): Payer: Medicare (Managed Care)

## 2021-06-18 DIAGNOSIS — N183 Chronic kidney disease, stage 3 unspecified: Secondary | ICD-10-CM

## 2021-06-18 DIAGNOSIS — E119 Type 2 diabetes mellitus without complications: Secondary | ICD-10-CM

## 2021-06-18 DIAGNOSIS — E785 Hyperlipidemia, unspecified: Secondary | ICD-10-CM | POA: Diagnosis not present

## 2021-06-18 DIAGNOSIS — N179 Acute kidney failure, unspecified: Secondary | ICD-10-CM | POA: Diagnosis not present

## 2021-06-18 DIAGNOSIS — I10 Essential (primary) hypertension: Secondary | ICD-10-CM | POA: Diagnosis not present

## 2021-06-18 DIAGNOSIS — R55 Syncope and collapse: Secondary | ICD-10-CM | POA: Diagnosis not present

## 2021-06-18 DIAGNOSIS — E875 Hyperkalemia: Secondary | ICD-10-CM | POA: Diagnosis not present

## 2021-06-18 DIAGNOSIS — N189 Chronic kidney disease, unspecified: Secondary | ICD-10-CM | POA: Diagnosis present

## 2021-06-18 DIAGNOSIS — D631 Anemia in chronic kidney disease: Secondary | ICD-10-CM | POA: Diagnosis present

## 2021-06-18 LAB — HEPATITIS PANEL, ACUTE
HCV Ab: NONREACTIVE
Hep A IgM: NONREACTIVE
Hep B C IgM: NONREACTIVE
Hepatitis B Surface Ag: NONREACTIVE

## 2021-06-18 LAB — CBC
HCT: 23.2 % — ABNORMAL LOW (ref 39.0–52.0)
Hemoglobin: 7.7 g/dL — ABNORMAL LOW (ref 13.0–17.0)
MCH: 32.5 pg (ref 26.0–34.0)
MCHC: 33.2 g/dL (ref 30.0–36.0)
MCV: 97.9 fL (ref 80.0–100.0)
Platelets: 158 K/uL (ref 150–400)
RBC: 2.37 MIL/uL — ABNORMAL LOW (ref 4.22–5.81)
RDW: 12.7 % (ref 11.5–15.5)
WBC: 7 K/uL (ref 4.0–10.5)
nRBC: 0.3 % — ABNORMAL HIGH (ref 0.0–0.2)

## 2021-06-18 LAB — COMPREHENSIVE METABOLIC PANEL WITH GFR
ALT: 16 U/L (ref 0–44)
AST: 15 U/L (ref 15–41)
Albumin: 2.9 g/dL — ABNORMAL LOW (ref 3.5–5.0)
Alkaline Phosphatase: 63 U/L (ref 38–126)
Anion gap: 9 (ref 5–15)
BUN: 40 mg/dL — ABNORMAL HIGH (ref 8–23)
CO2: 20 mmol/L — ABNORMAL LOW (ref 22–32)
Calcium: 9.3 mg/dL (ref 8.9–10.3)
Chloride: 107 mmol/L (ref 98–111)
Creatinine, Ser: 6.13 mg/dL — ABNORMAL HIGH (ref 0.61–1.24)
GFR, Estimated: 9 mL/min — ABNORMAL LOW
Glucose, Bld: 115 mg/dL — ABNORMAL HIGH (ref 70–99)
Potassium: 6 mmol/L — ABNORMAL HIGH (ref 3.5–5.1)
Sodium: 136 mmol/L (ref 135–145)
Total Bilirubin: 1 mg/dL (ref 0.3–1.2)
Total Protein: 6 g/dL — ABNORMAL LOW (ref 6.5–8.1)

## 2021-06-18 LAB — BASIC METABOLIC PANEL
Anion gap: 7 (ref 5–15)
BUN: 41 mg/dL — ABNORMAL HIGH (ref 8–23)
CO2: 20 mmol/L — ABNORMAL LOW (ref 22–32)
Calcium: 8.9 mg/dL (ref 8.9–10.3)
Chloride: 108 mmol/L (ref 98–111)
Creatinine, Ser: 5.97 mg/dL — ABNORMAL HIGH (ref 0.61–1.24)
GFR, Estimated: 10 mL/min — ABNORMAL LOW (ref >60)
Glucose, Bld: 128 mg/dL — ABNORMAL HIGH (ref 70–99)
Potassium: 5.7 mmol/L — ABNORMAL HIGH (ref 3.5–5.1)
Sodium: 135 mmol/L (ref 135–145)

## 2021-06-18 LAB — SODIUM, URINE, RANDOM: Sodium, Ur: 109 mmol/L

## 2021-06-18 LAB — D-DIMER, QUANTITATIVE: D-Dimer, Quant: 1.71 ug/mL-FEU — ABNORMAL HIGH (ref 0.00–0.50)

## 2021-06-18 LAB — RESP PANEL BY RT-PCR (FLU A&B, COVID) ARPGX2
Influenza A by PCR: NEGATIVE
Influenza B by PCR: NEGATIVE
SARS Coronavirus 2 by RT PCR: NEGATIVE

## 2021-06-18 LAB — PROTEIN / CREATININE RATIO, URINE
Creatinine, Urine: 54.18 mg/dL
Creatinine, Urine: 45.87 mg/dL
Protein Creatinine Ratio: 1.24 mg/mg{Cre} — ABNORMAL HIGH (ref 0.00–0.15)
Protein Creatinine Ratio: 1.22 mg/mg{Cre} — ABNORMAL HIGH (ref 0.00–0.15)
Total Protein, Urine: 67 mg/dL
Total Protein, Urine: 56 mg/dL

## 2021-06-18 LAB — URINALYSIS, ROUTINE W REFLEX MICROSCOPIC
Bacteria, UA: NONE SEEN
Bilirubin Urine: NEGATIVE
Glucose, UA: 150 mg/dL — AB
Hgb urine dipstick: NEGATIVE
Ketones, ur: NEGATIVE mg/dL
Leukocytes,Ua: NEGATIVE
Nitrite: NEGATIVE
Protein, ur: 30 mg/dL — AB
Specific Gravity, Urine: 1.01 (ref 1.005–1.030)
pH: 8 (ref 5.0–8.0)

## 2021-06-18 LAB — LACTIC ACID, PLASMA: Lactic Acid, Venous: 0.9 mmol/L (ref 0.5–1.9)

## 2021-06-18 LAB — TROPONIN I (HIGH SENSITIVITY)
Troponin I (High Sensitivity): 5 ng/L (ref <18)
Troponin I (High Sensitivity): 5 ng/L

## 2021-06-18 LAB — MRSA NEXT GEN BY PCR, NASAL: MRSA by PCR Next Gen: NOT DETECTED

## 2021-06-18 LAB — PHOSPHORUS: Phosphorus: 4.3 mg/dL (ref 2.5–4.6)

## 2021-06-18 LAB — MAGNESIUM
Magnesium: 2.1 mg/dL (ref 1.7–2.4)
Magnesium: 2.1 mg/dL (ref 1.7–2.4)

## 2021-06-18 LAB — CK: Total CK: 56 U/L (ref 49–397)

## 2021-06-18 LAB — PROTIME-INR
INR: 1.1 (ref 0.8–1.2)
Prothrombin Time: 14.6 s (ref 11.4–15.2)

## 2021-06-18 LAB — CREATININE, URINE, RANDOM: Creatinine, Urine: 54.61 mg/dL

## 2021-06-18 LAB — HIV ANTIBODY (ROUTINE TESTING W REFLEX): HIV Screen 4th Generation wRfx: NONREACTIVE

## 2021-06-18 LAB — CBG MONITORING, ED: Glucose-Capillary: 109 mg/dL — ABNORMAL HIGH (ref 70–99)

## 2021-06-18 MED ORDER — SODIUM ZIRCONIUM CYCLOSILICATE 10 G PO PACK
10.0000 g | PACK | Freq: Once | ORAL | Status: AC
  Administered 2021-06-18: 10 g via ORAL
  Filled 2021-06-18: qty 1

## 2021-06-18 MED ORDER — TAMSULOSIN HCL 0.4 MG PO CAPS
0.4000 mg | ORAL_CAPSULE | Freq: Every day | ORAL | Status: DC
  Administered 2021-06-18 – 2021-06-21 (×4): 0.4 mg via ORAL
  Filled 2021-06-18 (×4): qty 1

## 2021-06-18 MED ORDER — ATORVASTATIN CALCIUM 40 MG PO TABS
40.0000 mg | ORAL_TABLET | Freq: Every day | ORAL | Status: DC
  Administered 2021-06-18 – 2021-06-21 (×4): 40 mg via ORAL
  Filled 2021-06-18 (×4): qty 1

## 2021-06-18 MED ORDER — INSULIN ASPART 100 UNIT/ML IV SOLN
5.0000 [IU] | Freq: Once | INTRAVENOUS | Status: AC
  Administered 2021-06-18: 5 [IU] via INTRAVENOUS

## 2021-06-18 MED ORDER — FINASTERIDE 5 MG PO TABS
5.0000 mg | ORAL_TABLET | Freq: Every day | ORAL | Status: DC
  Administered 2021-06-18 – 2021-06-21 (×5): 5 mg via ORAL
  Filled 2021-06-18 (×5): qty 1

## 2021-06-18 MED ORDER — DEXTROSE 50 % IV SOLN
1.0000 | Freq: Once | INTRAVENOUS | Status: AC
  Administered 2021-06-18: 50 mL via INTRAVENOUS
  Filled 2021-06-18: qty 50

## 2021-06-18 MED ORDER — SODIUM CHLORIDE 0.9 % IV SOLN: INTRAVENOUS | Status: DC

## 2021-06-18 MED ORDER — ALBUTEROL SULFATE (2.5 MG/3ML) 0.083% IN NEBU
2.5000 mg | INHALATION_SOLUTION | Freq: Once | RESPIRATORY_TRACT | Status: AC
  Administered 2021-06-18: 2.5 mg via RESPIRATORY_TRACT
  Filled 2021-06-18: qty 3

## 2021-06-18 MED ORDER — HYDRALAZINE HCL 25 MG PO TABS
25.0000 mg | ORAL_TABLET | Freq: Four times a day (QID) | ORAL | Status: DC | PRN
Start: 2021-06-18 — End: 2021-06-19
  Administered 2021-06-19: 25 mg via ORAL
  Filled 2021-06-18: qty 1

## 2021-06-18 NOTE — Telephone Encounter (Signed)
Pt returned call and was notified about the changes.

## 2021-06-18 NOTE — Progress Notes (Signed)
PROGRESS NOTE  Scott Woods    DOB: 11-18-1950, 70 y.o.  MOQ:[id number]  PCP: [name], PA-C   Code Status: Full Code   DOA: 06/13/2021   LOS: 1  Brief Narrative of Current Hospitalization  Scott Woods is a 70 y.o. male with a PMH significant for CKD 4, HTN, HLD, BPH, chronic anemia. They presented from volleyball game to the ED on 05/30/2021 with syncope x1. In the ED, it was found that they had hyperkalemia. They were treated with albuterol, insulin, lokelma, LR.  Patient was admitted to medicine service for further workup and management of ARF and syncope as outlined in detail below.  06/18/21 -feels back to baseline  Assessment & Plan  Principal Problem:   ARF (acute renal failure) (HCC) Active Problems:   Hyperlipidemia LDL goal <100   Hypertension, goal below 140/90   Hyperkalemia   Syncope   Anemia due to chronic kidney disease  Syncope- remains tachycardic and has history of same. ECG sinus and similar to previous. With no prodrome and rapid recovery, and in setting of h/o abnormal rhythm/rate and electrolyte imbalances on admission, suspicious for arrhythmia. Carotid artery duplex showed non-obstructive disease but unlikely source to have both occluded to cause syncope. Not having any respiratory concerns but will order D-dimer to hopefully rule out low risk of PE contributing - continue telemetry - IV fluids  Hyperkalemia- improved today 6.3>6.0>5.7 s/p Lokelma, albuterol, insulin x2.  In setting of AKI on CKD 4 with creatinine baseline of around 2.7.  Creatinine is stable today since admission at 6.2>6.1.  Suspect this is from hypoperfusion.  History of AKI from distal obstruction which has resolved since 02/2021. - repeat ecg -Discontinued LR and change to normal saline -Repeat insulin with dextrose x1, Lokelma -Albuterol nebulizer x1 -nephrology following - avoid nephrotoxic agents - w/u for GN - daily Cr -Strict I/O - RFP am  HTN - holding home lisinopril  for AKI  BPH - continue home finasteride - continue home flomax  HLD - continue home atorvastatin  Tremor- patient has hand tremor which he attributes to being cold but is consistent with essential tremor. Negative asterixis.  - given the tremor and chronic tachycardia, would like to trial low dose BB this admission but will hold off for further telemetry monitoring for occult block or arrhythmia  DVT prophylaxis: SCDs Start: 06/02/2021 2337   Diet:  Diet Orders (From admission, onward)     Start     Ordered   05/21/2021 2339  Diet regular Room service appropriate? Yes; Fluid consistency: Thin  Diet effective now       Question Answer Comment  Room service appropriate? Yes   Fluid consistency: Thin      05/26/2021 2338            Subjective 06/18/21    Pt reports feels back to baseline. No concerns at this time. He remembers before and after the syncopal episode but did lose consciousness.  Disposition Plan & Communication  Status is: Inpatient  Remains inpatient appropriate because:Persistent severe electrolyte disturbances and Ongoing diagnostic testing needed not appropriate for outpatient work up  Dispo: The patient is from: Home              Anticipated d/c is to: Home              Patient currently is not medically stable to d/c.   Difficult to place patient No  Family Communication: none   Consults, Procedures, Significant Events  Consultants:  nephrology  Procedures/significant events:  none  Antimicrobials:  Anti-infectives (From admission, onward)    None        Objective   Vitals:   06/18/21 0500 06/18/21 0600 06/18/21 0700 06/18/21 0752  BP: 130/70 (!) 144/83 (!) 153/80 (!) 141/82  Pulse: (!) 109 (!) 106 99 99  Resp:   16 20  Temp:   98.1 F (36.7 C)   TempSrc:   Oral   SpO2: 99% 97% 100% 98%  Weight:      Height:       No intake or output data in the 24 hours ending 06/18/21 0825 Filed Weights   05/20/2021 2051  Weight: 68 kg     Patient BMI: Body mass index is 21.52 kg/m.   Physical Exam: General: awake, alert, NAD HEENT: atraumatic, clear conjunctiva, anicteric sclera, dry mucus membranes, hearing grossly normal Respiratory: CTAB, no wheezes, rales or rhonchi, normal respiratory effort. Cardiovascular: normal S1/S2, tachycardic but regular, no JVD, murmurs, rubs, gallops, quick capillary refill  Gastrointestinal: soft, NT, ND, no HSM felt Nervous: A&O x4. no gross focal neurologic deficits, normal speech Extremities: moves all equally, no edema, normal tone. Essential tremor bilateral arms Skin: dry, intact, normal temperature, normal color, No rashes, lesions or ulcers. Yellowish tint to skin Psychiatry: normal mood, congruent affect  Labs   I have personally reviewed following labs and imaging studies Admission on 05/20/2021  Component Date Value Ref Range Status   Sodium 06/01/2021 137  135 - 145 mmol/L Final   Potassium 06/13/2021 6.3 (A) 3.5 - 5.1 mmol/L Final   Chloride 05/23/2021 106  98 - 111 mmol/L Final   CO2 06/12/2021 22  22 - 32 mmol/L Final   Glucose, Bld 06/05/2021 124 (A) 70 - 99 mg/dL Final   BUN 06/11/2021 44 (A) 8 - 23 mg/dL Final   Creatinine, Ser 06/04/2021 6.23 (A) 0.61 - 1.24 mg/dL Final   Calcium 05/22/2021 9.6  8.9 - 10.3 mg/dL Final   Total Protein 05/28/2021 6.5  6.5 - 8.1 g/dL Final   Albumin 06/02/2021 3.1 (A) 3.5 - 5.0 g/dL Final   AST 06/13/2021 16  15 - 41 U/L Final   ALT 05/20/2021 18  0 - 44 U/L Final   Alkaline Phosphatase 06/08/2021 66  38 - 126 U/L Final   Total Bilirubin 05/31/2021 0.5  0.3 - 1.2 mg/dL Final   GFR, Estimated 06/14/2021 9 (A) >60 mL/min Final   Anion gap 06/01/2021 9  5 - 15 Final   Troponin I (High Sensitivity) 05/26/2021 5  <18 ng/L Final   WBC 06/03/2021 8.8  4.0 - 10.5 K/uL Final   RBC 06/16/2021 2.56 (A) 4.22 - 5.81 MIL/uL Final   Hemoglobin 06/16/2021 8.2 (A) 13.0 - 17.0 g/dL Final   HCT 06/15/2021 25.6 (A) 39.0 - 52.0 % Final   MCV  05/26/2021 100.0  80.0 - 100.0 fL Final   MCH 05/21/2021 32.0  26.0 - 34.0 pg Final   MCHC 06/05/2021 32.0  30.0 - 36.0 g/dL Final   RDW 06/01/2021 12.8  11.5 - 15.5 % Final   Platelets 06/16/2021 192  150 - 400 K/uL Final   nRBC 06/02/2021 0.0  0.0 - 0.2 % Final   Neutrophils Relative % 05/20/2021 80  % Final   Neutro Abs 05/30/2021 7.0  1.7 - 7.7 K/uL Final   Lymphocytes Relative 05/25/2021 13  % Final   Lymphs Abs 06/16/2021 1.2  0.7 - 4.0 K/uL Final   Monocytes Relative  05/23/2021 5  % Final   Monocytes Absolute 06/11/2021 0.5  0.1 - 1.0 K/uL Final   Eosinophils Relative 05/30/2021 1  % Final   Eosinophils Absolute 06/15/2021 0.1  0.0 - 0.5 K/uL Final   Basophils Relative 05/21/2021 0  % Final   Basophils Absolute 05/24/2021 0.0  0.0 - 0.1 K/uL Final   Immature Granulocytes 06/07/2021 1  % Final   Abs Immature Granulocytes 05/21/2021 0.05  0.00 - 0.07 K/uL Final   Color, Urine 06/18/2021 STRAW (A) YELLOW Final   APPearance 06/18/2021 CLEAR  CLEAR Final   Specific Gravity, Urine 06/18/2021 1.010  1.005 - 1.030 Final   pH 06/18/2021 8.0  5.0 - 8.0 Final   Glucose, UA 06/18/2021 150 (A) NEGATIVE mg/dL Final   Hgb urine dipstick 06/18/2021 NEGATIVE  NEGATIVE Final   Bilirubin Urine 06/18/2021 NEGATIVE  NEGATIVE Final   Ketones, ur 06/18/2021 NEGATIVE  NEGATIVE mg/dL Final   Protein, ur 06/18/2021 30 (A) NEGATIVE mg/dL Final   Nitrite 06/18/2021 NEGATIVE  NEGATIVE Final   Leukocytes,Ua 06/18/2021 NEGATIVE  NEGATIVE Final   WBC, UA 06/18/2021 0-5  0 - 5 WBC/hpf Final   Bacteria, UA 06/18/2021 NONE SEEN  NONE SEEN Final   Lactic Acid, Venous 05/23/2021 0.9  0.5 - 1.9 mmol/L Final   Lactic Acid, Venous 06/18/2021 0.9  0.5 - 1.9 mmol/L Final   Troponin I (High Sensitivity) 06/18/2021 5  <18 ng/L Final   Glucose-Capillary 06/18/2021 109 (A) 70 - 99 mg/dL Final   SARS Coronavirus 2 by RT PCR 06/18/2021 NEGATIVE  NEGATIVE Final   Influenza A by PCR 06/18/2021 NEGATIVE  NEGATIVE Final    Influenza B by PCR 06/18/2021 NEGATIVE  NEGATIVE Final   Phosphorus 06/18/2021 4.3  2.5 - 4.6 mg/dL Final   Magnesium 06/18/2021 2.1  1.7 - 2.4 mg/dL Final   Magnesium 06/18/2021 2.1  1.7 - 2.4 mg/dL Final   Sodium 06/18/2021 136  135 - 145 mmol/L Final   Potassium 06/18/2021 6.0 (A) 3.5 - 5.1 mmol/L Final   Chloride 06/18/2021 107  98 - 111 mmol/L Final   CO2 06/18/2021 20 (A) 22 - 32 mmol/L Final   Glucose, Bld 06/18/2021 115 (A) 70 - 99 mg/dL Final   BUN 06/18/2021 40 (A) 8 - 23 mg/dL Final   Creatinine, Ser 06/18/2021 6.13 (A) 0.61 - 1.24 mg/dL Final   Calcium 06/18/2021 9.3  8.9 - 10.3 mg/dL Final   Total Protein 06/18/2021 6.0 (A) 6.5 - 8.1 g/dL Final   Albumin 06/18/2021 2.9 (A) 3.5 - 5.0 g/dL Final   AST 06/18/2021 15  15 - 41 U/L Final   ALT 06/18/2021 16  0 - 44 U/L Final   Alkaline Phosphatase 06/18/2021 63  38 - 126 U/L Final   Total Bilirubin 06/18/2021 1.0  0.3 - 1.2 mg/dL Final   GFR, Estimated 06/18/2021 9 (A) >60 mL/min Final   Anion gap 06/18/2021 9  5 - 15 Final   WBC 06/18/2021 7.0  4.0 - 10.5 K/uL Final   RBC 06/18/2021 2.37 (A) 4.22 - 5.81 MIL/uL Final   Hemoglobin 06/18/2021 7.7 (A) 13.0 - 17.0 g/dL Final   HCT 06/18/2021 23.2 (A) 39.0 - 52.0 % Final   MCV 06/18/2021 97.9  80.0 - 100.0 fL Final   MCH 06/18/2021 32.5  26.0 - 34.0 pg Final   MCHC 06/18/2021 33.2  30.0 - 36.0 g/dL Final   RDW 06/18/2021 12.7  11.5 - 15.5 % Final   Platelets 06/18/2021 158  150 - 400 K/uL Final   nRBC 06/18/2021 0.3 (A) 0.0 - 0.2 % Final   Total CK 06/18/2021 56  49 - 397 U/L Final   Creatinine, Urine 06/18/2021 54.18  mg/dL Final   Total Protein, Urine 06/18/2021 67  mg/dL Final   Protein Creatinine Ratio 06/18/2021 1.24 (A) 0.00 - 0.15 mg/mg[Cre] Final   Sodium, Ur 06/18/2021 109  mmol/L Final   Creatinine, Urine 06/18/2021 54.61  mg/dL Final   Prothrombin Time 06/18/2021 14.6  11.4 - 15.2 seconds Final   INR 06/18/2021 1.1  0.8 - 1.2 Final   Troponin I (High Sensitivity)  06/18/2021 5  <18 ng/L Final     Recent Results (from the past 240 hour(s))  Resp Panel by RT-PCR (Flu A&B, Covid) Nasopharyngeal Swab     Status: None   Collection Time: 06/18/21  1:25 AM   Specimen: Nasopharyngeal Swab; Nasopharyngeal(NP) swabs in vial transport medium  Result Value Ref Range Status   SARS Coronavirus 2 by RT PCR NEGATIVE NEGATIVE Final    Comment: (NOTE) SARS-CoV-2 target nucleic acids are NOT DETECTED.  The SARS-CoV-2 RNA is generally detectable in upper respiratory specimens during the acute phase of infection. The lowest concentration of SARS-CoV-2 viral copies this assay can detect is 138 copies/mL. A negative result does not preclude SARS-Cov-2 infection and should not be used as the sole basis for treatment or other patient management decisions. A negative result may occur with  improper specimen collection/handling, submission of specimen other than nasopharyngeal swab, presence of viral mutation(s) within the areas targeted by this assay, and inadequate number of viral copies(<138 copies/mL). A negative result must be combined with clinical observations, patient history, and epidemiological information. The expected result is Negative.  Fact Sheet for Patients:  EntrepreneurPulse.com.au  Fact Sheet for Healthcare Providers:  IncredibleEmployment.be  This test is no t yet approved or cleared by the [place] FDA and  has been authorized for detection and/or diagnosis of SARS-CoV-2 by FDA under an Emergency Use Authorization (EUA). This EUA will remain  in effect (meaning this test can be used) for the duration of the COVID-19 declaration under Section 564(b)(1) of the Act, 21 U.S.C.section 360bbb-3(b)(1), unless the authorization is terminated  or revoked sooner.       Influenza A by PCR NEGATIVE NEGATIVE Final   Influenza B by PCR NEGATIVE NEGATIVE Final    Comment: (NOTE) The Xpert Xpress  SARS-CoV-2/FLU/RSV plus assay is intended as an aid in the diagnosis of influenza from Nasopharyngeal swab specimens and should not be used as a sole basis for treatment. Nasal washings and aspirates are unacceptable for Xpert Xpress SARS-CoV-2/FLU/RSV testing.  Fact Sheet for Patients: EntrepreneurPulse.com.au  Fact Sheet for Healthcare Providers: IncredibleEmployment.be  This test is not yet approved or cleared by the [place] FDA and has been authorized for detection and/or diagnosis of SARS-CoV-2 by FDA under an Emergency Use Authorization (EUA). This EUA will remain in effect (meaning this test can be used) for the duration of the COVID-19 declaration under Section 564(b)(1) of the Act, 21 U.S.C. section 360bbb-3(b)(1), unless the authorization is terminated or revoked.  Performed at [facility], [place] [address], [place], [place] [address]      Imaging Studies  CT Abdomen Pelvis Wo Contrast  Result Date: 05/26/2021 CLINICAL DATA:  Abdominal trauma EXAM: CT ABDOMEN AND PELVIS WITHOUT CONTRAST TECHNIQUE: Multidetector CT imaging of the abdomen and pelvis was performed following the standard protocol without IV contrast. COMPARISON:  None. FINDINGS:  LOWER CHEST: Normal. HEPATOBILIARY: Normal hepatic contours. No intra- or extrahepatic biliary dilatation. There is cholelithiasis without acute inflammation. PANCREAS: Normal pancreas. No ductal dilatation or peripancreatic fluid collection. SPLEEN: Normal. ADRENALS/URINARY TRACT: Unchanged appearance of small left adrenal adenoma. No hydronephrosis, nephroureterolithiasis or solid renal mass. The urinary bladder is normal for degree of distention STOMACH/BOWEL: There is no hiatal hernia. Normal duodenal course and caliber. No small bowel dilatation or inflammation. No focal colonic abnormality. Normal appendix. VASCULAR/LYMPHATIC: There is calcific atherosclerosis of the abdominal aorta. No  lymphadenopathy. REPRODUCTIVE: Enlarged prostate measures 5.0 cm in transverse dimension. MUSCULOSKELETAL. No bony spinal canal stenosis or focal osseous abnormality. OTHER: None. IMPRESSION: 1. No acute abnormality of the abdomen or pelvis. 2. Unchanged appearance of small left adrenal adenoma. 3. Cholelithiasis without acute inflammation. Aortic Atherosclerosis (ICD10-I70.0). Electronically Signed   By: Ulyses Jarred M.D.   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 No acute bone abnormality. Lungs are clear. No pneumothorax or pleural effusion. Cardiac size within normal limits. No superior mediastinal widening. Pulmonary vascularity is normal. IMPRESSION: No acute abnormality. Electronically Signed   By: Fidela Salisbury M.D.   On: 06/06/2021 22:17   CT Head Wo Contrast  Result Date: 06/05/2021 CLINICAL DATA:  Head and neck trauma, fell EXAM: CT HEAD WITHOUT CONTRAST TECHNIQUE: Contiguous axial images were obtained from the base of the skull through the vertex without intravenous contrast. COMPARISON:  10/10/2019 FINDINGS: Brain: No acute infarct or hemorrhage. Lateral ventricles and midline structures are unremarkable. No acute extra-axial fluid collections. No mass effect. Vascular: No hyperdense vessel or unexpected calcification. Skull: Normal. Negative for fracture or focal lesion. Sinuses/Orbits: Opacification of the right maxillary sinus. Remaining paranasal sinuses are clear. Other: None. IMPRESSION: 1. No acute intracranial process. 2. Right maxillary sinus disease. Electronically Signed   By: Randa Ngo M.D.   On: 06/11/2021 22:29   CT Cervical Spine Wo Contrast  Result Date: 05/25/2021 CLINICAL DATA:  Head and neck trauma, fell EXAM: CT  CERVICAL SPINE WITHOUT CONTRAST TECHNIQUE: Multidetector CT imaging of the cervical spine was performed without intravenous contrast. Multiplanar CT image reconstructions were also generated. COMPARISON:  10/10/2019 FINDINGS: Alignment: Alignment is anatomic. Skull base and vertebrae: No acute fracture. No primary bone lesion or focal pathologic process. Soft tissues and spinal canal: No prevertebral fluid or swelling. No visible canal hematoma. Disc levels: Mild diffuse cervical spondylosis most pronounced at C4-5. Upper chest: Airway is patent.  Lung apices are clear. Other: Reconstructed images demonstrate no additional findings. IMPRESSION: 1. No acute cervical spine fracture. 2. Stable cervical spondylosis. Electronically Signed   By: Randa Ngo M.D.   On: 06/12/2021 22:34   Medications   Scheduled Meds:  atorvastatin  40 mg Oral QHS   finasteride  5 mg Oral QHS   tamsulosin  0.4 mg Oral QPC supper     LOS: 1 day   Time spent: >76min  Nery Kalisz L Koralee Wedeking, DO [facility] 06/18/2021, 8:25 AM   To contact the [facility] Attending or Consulting provider for this patient: Check the care team in [facility] for a) attending/consulting [place] provider listed and b) the [facility] team listed Log into [email or web address] and use [place]'s universal password to access. If you do not have the password, please contact the  hospital operator. Locate the [facility] provider you are looking for under [facility] and page to a number that you can be directly reached. If you still have difficulty reaching the provider, please page the [facility] (Director on Call) for the Hospitalists listed on amion for assistance.

## 2021-06-18 NOTE — Telephone Encounter (Signed)
Attempted to reach out to pt to notify him about the changes.

## 2021-06-18 NOTE — Progress Notes (Signed)
Carotid duplex has been completed.   Preliminary results in CV Proc.   Mamie Diiorio Jadie Comas 06/18/2021 11:28 AM

## 2021-06-18 NOTE — Progress Notes (Signed)
[facility] night shift PCU coverage note.  The nursing staff reported that the patient's blood pressure is elevated with the most recent blood pressure measuring 159/101 mmHg.  They also stated that the patient has been frequently tachycardic in the low 100s.  He was admitted for syncopal episode.  His lisinopril was held due to AKI and hyperkalemia.  Hydralazine 25 mg every 6 hours as needed x2 doses ordered.  The primary team will address further antihypertensive therapy in the morning.  Tennis Must, MD.

## 2021-06-18 NOTE — Consult Note (Signed)
Nephrology Consult   Requesting provider: Doristine Mango Reason for consult: AKI on CKD 3a   Assessment/Recommendations: Scott Woods is a/an 70 y.o. male with a past medical history TN, CAD, DM2, PVD, BPH, CKDa who present w/ fall and AKI on CKD 3a   Non-Oliguric AKI on CKD 3a: This is a confusing clinical picture. I think it is unclear why the patient has had 2 very severe AKIs in a short period of time. His Crt improved last time but not to his likely baseline and does not make sense why he had such severe abnormality of creatinine given no preceeding events for dehydration on either hospitalization and no hydronephrosis.  However, this could simply be related to hemodynamics from significant hypotension related to finasteride, Flomax, lisinopril use but this seems unlikely given the patient's normal blood pressure in the hospital.  Urinalysis has been fairly bland without significant hematuria but he does report a history of intermittent hematuria in the past which could represent a glomerular disease.  What is also curious is that his urinalysis had minimal proteinuria but UPC was 1.24g which seems out of proportion.  - will perform extensive serological work up for GN despite reassuring urine given clinical picture makes little sense -Fortunately no indication for dialysis at this time but will have to monitor closely -Continue with IV hydration for now -Continue to monitor daily Cr, Dose meds for GFR -Monitor Daily I/Os, Daily weight  -Maintain MAP>65 for optimal renal perfusion.  -Avoid nephrotoxic medications including NSAIDs and Vanc/Zosyn combo -Hold lisinopril -Follow-up CPK level  Hyperkalemia: Potassium now improved to 5.7.  Continue Lokelma as needed.  Hydration as above  Tachycardia: Patient is tachycardic and normotensive.  No hypoxia or chest pain.  Given his syncope of pulmonary embolus could be considered but seems unlikely.  Defer to primary team  Hypertension: Hold  lisinopril.  HLD: On statin  BPH: Continue home meds as tolerated    Recommendations conveyed to primary service.    [facility] 06/18/2021 12:39 PM   _____________________________________________________________________________________ CC: AKI on CKD  History of Present Illness: Scott Woods is a/an 70 y.o. male with a past medical history of HTN, CAD, DM2, PVD, BPH, CKD who presents with syncope as well as AKI on CKD.   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  Does not remember having problems with syncope in the past.  He reports being asymptomatic as of late.  Denies any issues urinating.  States that he was told by providers outpatient that his kidneys were fine but we do not have any labs supporting that his kidney function returned to a normal baseline.  He denies any difficulties voiding or pain with urination.  He does report several episodes of hematuria in the past and he is unsure the cause.  Not clearly associated with any viral infections.  States he has been drinking well and urinating well.  No diarrhea or signs of dehydration per patient.  Denies any cough, hemoptysis, rashes, joint issues.  No fevers, chills, shortness of breath, chest pain. No NSAID use. Prescribed lisinopril at home.  Defining the patient's baseline creatinine is a little bit difficult.  It appears the patient had mild CKD in 2021 with a creatinine around 1-1.3.  We have an isolated creatinine of 1.5 in January 2022.  Patient was hospitalized for severe AKI in May 2022 with creatinine up to 7.  He  was felt that this time that that was largely related to dehydration and possible obstruction although hydronephrosis was not apparent on imaging.  He was hydrated and a Foley catheter was placed and his creatinine improved to 2.8.  We do not have outside records to know what his creatinine was between June and  September.  Patient thinks his kidneys were fine but he also notes that he was referred to nephrology and had not been seen yet.  The patient has followed up with urology in the outpatient setting and underwent a prostate biopsy which did not demonstrate any signs of malignancy. He was placed on finasteride and flomax.   On arrival the patient's creatinine was again 6.2. K was 6.  He has been tachycardic but blood pressure has been okay.  CT abdomen pelvis in the emergency department without signs of obstruction.  Medications:  Current Facility-Administered Medications  Medication Dose Route Frequency Provider Last Rate Last Admin   0.9 %  sodium chloride infusion   Intravenous Continuous Richarda Osmond, MD 100 mL/hr at 06/18/21 8413 New Bag at 06/18/21 2440   acetaminophen (TYLENOL) tablet 650 mg  650 mg Oral Q6H PRN Howerter, Justin B, DO       Or   acetaminophen (TYLENOL) suppository 650 mg  650 mg Rectal Q6H PRN Howerter, Justin B, DO       atorvastatin (LIPITOR) tablet 40 mg  40 mg Oral QHS Howerter, Justin B, DO       finasteride (PROSCAR) tablet 5 mg  5 mg Oral QHS Howerter, Justin B, DO   5 mg at 06/18/21 0222   tamsulosin (FLOMAX) capsule 0.4 mg  0.4 mg Oral QPC supper Howerter, Justin B, DO       Current Outpatient Medications  Medication Sig Dispense Refill   atorvastatin (LIPITOR) 40 MG tablet TAKE 1 TABLET BY MOUTH EVERYDAY AT BEDTIME (Patient taking differently: Take 40 mg by mouth at bedtime.) 90 tablet 3   finasteride (PROSCAR) 5 MG tablet TAKE 1 TABLET BY MOUTH EVERY DAY (Patient taking differently: Take 5 mg by mouth at bedtime.) 90 tablet 3   lisinopril (ZESTRIL) 10 MG tablet Take 10 mg by mouth at bedtime.     tamsulosin (FLOMAX) 0.4 MG CAPS capsule Take 1 capsule (0.4 mg total) by mouth daily after supper. 90 capsule 3     ALLERGIES Patient has no known allergies.  MEDICAL HISTORY Past Medical History:  Diagnosis Date   Adrenal nodule ([place])    Blood in the  urine 05/08/2015   Borderline diabetes    Chronic kidney disease    CKD (chronic kidney disease), stage II    Diabetes mellitus without complication (HCC)    Elevated PSA    Gross hematuria    H/O type B viral hepatitis 05/08/2015   When he was a child    History of hepatitis B    HTN (hypertension)    Hyperlipidemia with target LDL less than 100    Obesity    Pre-ulcerative calluses 03/28/2016   great toe    Prostate cancer [facility])      SOCIAL HISTORY Social History   Socioeconomic History   Marital status: Divorced    Spouse name: Not on file   Number of children: 2   Years of education: Not on file   Highest education level: Not on file  Occupational History   Not on file  Tobacco Use   Smoking status: Never   Smokeless tobacco: Never  Tobacco comments:    Smoking cessation materials not required  Vaping Use   Vaping Use: Never used  Substance and Sexual Activity   Alcohol use: No    Alcohol/week: 0.0 standard drinks   Drug use: No   Sexual activity: Not Currently  Other Topics Concern   Not on file  Social History Narrative   Not on file   Social Determinants of Health   Financial Resource Strain: Low Risk    Difficulty of Paying Living Expenses: Not hard at all  Food Insecurity: No Food Insecurity   Worried About Charity fundraiser in the Last Year: Never true   [place]oth in the Last Year: Never true  Transportation Needs: No Transportation Needs   Lack of Transportation (Medical): No   Lack of Transportation (Non-Medical): No  Physical Activity: Inactive   Days of Exercise per Week: 0 days   Minutes of Exercise per Session: 0 min  Stress: No Stress Concern Present   Feeling of Stress : Not at all  Social Connections: Moderately Isolated   Frequency of Communication with Friends and Family: More than three times a week   Frequency of Social Gatherings with Friends and Family: Once a week   Attends Religious Services: More than 4 times per year    Active Member of [employer] or Organizations: No   Attends Music therapist: Never   Marital Status: Divorced  Human resources officer Violence: Not At Risk   Fear of Current or Ex-Partner: No   Emotionally Abused: No   Physically Abused: No   Sexually Abused: No     FAMILY HISTORY Family History  Problem Relation Age of Onset   Parkinson's disease Brother    Heart disease Mother    Diabetes Mellitus II Mother    Coronary artery disease Mother    Heart disease Father    Prostate cancer Neg Hx    Kidney cancer Neg Hx    Bladder Cancer Neg Hx       Review of Systems: 12 systems reviewed Otherwise as per HPI, all other systems reviewed and negative  Physical Exam: Vitals:   06/18/21 1100 06/18/21 1130  BP: 136/78 140/81  Pulse: (!) 108 (!) 108  Resp: 16 16  Temp:    SpO2: 100% 99%   No intake/output data recorded. No intake or output data in the 24 hours ending 06/18/21 1239 General: well-appearing, no acute distress HEENT: anicteric sclera, oropharynx clear without lesions CV: regular rate, normal rhythm, no murmurs, no gallops, no rubs, no peripheral edema Lungs: clear to auscultation bilaterally, normal work of breathing Abd: soft, non-tender, non-distended Skin: Appears pale, otherwise no visible lesions or rashes Psych: alert, engaged, appropriate mood and affect Musculoskeletal: no obvious deformities Neuro: normal speech, no gross focal deficits   Test Results Reviewed Lab Results  Component Value Date   NA 136 06/18/2021   K 6.0 (H) 06/18/2021   CL 107 06/18/2021   CO2 20 (L) 06/18/2021   BUN 40 (H) 06/18/2021   CREATININE 6.13 (H) 06/18/2021   CALCIUM 9.3 06/18/2021   ALBUMIN 2.9 (L) 06/18/2021   PHOS 4.3 06/18/2021     I have reviewed all relevant outside healthcare records related to the patient's current hospitalization

## 2021-06-19 ENCOUNTER — Other Ambulatory Visit: Payer: Self-pay | Admitting: Family Medicine

## 2021-06-19 DIAGNOSIS — E875 Hyperkalemia: Secondary | ICD-10-CM | POA: Diagnosis not present

## 2021-06-19 DIAGNOSIS — E785 Hyperlipidemia, unspecified: Secondary | ICD-10-CM | POA: Diagnosis not present

## 2021-06-19 DIAGNOSIS — E1129 Type 2 diabetes mellitus with other diabetic kidney complication: Secondary | ICD-10-CM

## 2021-06-19 DIAGNOSIS — N179 Acute kidney failure, unspecified: Secondary | ICD-10-CM | POA: Diagnosis not present

## 2021-06-19 DIAGNOSIS — N183 Chronic kidney disease, stage 3 unspecified: Secondary | ICD-10-CM | POA: Diagnosis not present

## 2021-06-19 LAB — RENAL FUNCTION PANEL
Albumin: 2.6 g/dL — ABNORMAL LOW (ref 3.5–5.0)
Anion gap: 7 (ref 5–15)
BUN: 40 mg/dL — ABNORMAL HIGH (ref 8–23)
CO2: 20 mmol/L — ABNORMAL LOW (ref 22–32)
Calcium: 8.5 mg/dL — ABNORMAL LOW (ref 8.9–10.3)
Chloride: 107 mmol/L (ref 98–111)
Creatinine, Ser: 5.83 mg/dL — ABNORMAL HIGH (ref 0.61–1.24)
GFR, Estimated: 10 mL/min — ABNORMAL LOW
Glucose, Bld: 116 mg/dL — ABNORMAL HIGH (ref 70–99)
Phosphorus: 5 mg/dL — ABNORMAL HIGH (ref 2.5–4.6)
Potassium: 5.1 mmol/L (ref 3.5–5.1)
Sodium: 134 mmol/L — ABNORMAL LOW (ref 135–145)

## 2021-06-19 LAB — MICROALBUMIN / CREATININE URINE RATIO
Creatinine, Urine: 40.7 mg/dL
Microalb Creat Ratio: 65 mg/g{creat} — ABNORMAL HIGH (ref 0–29)
Microalb, Ur: 26.3 ug/mL — ABNORMAL HIGH

## 2021-06-19 LAB — PROLACTIN: Prolactin: 8.5 ng/mL (ref 4.0–15.2)

## 2021-06-19 LAB — SYPHILIS: RPR W/REFLEX TO RPR TITER AND TREPONEMAL ANTIBODIES, TRADITIONAL SCREENING AND DIAGNOSIS ALGORITHM: RPR Ser Ql: NONREACTIVE

## 2021-06-19 MED ORDER — SODIUM BICARBONATE 650 MG PO TABS
650.0000 mg | ORAL_TABLET | Freq: Two times a day (BID) | ORAL | Status: DC
  Administered 2021-06-19 (×2): 650 mg via ORAL
  Filled 2021-06-19 (×2): qty 1

## 2021-06-19 NOTE — Progress Notes (Signed)
Nephrology Follow-Up Consult note   Assessment/Recommendations: Scott Woods is a/an 70 y.o. male with a past medical history significant for HTN, CAD, DM2, PVD, BPH, CKDa who present w/ fall and AKI on CKD 3a    Non-Oliguric AKI on CKD 3a: Severe AKI in May thought to be dehydration and/or obstruction now again with severe AKI and cause is not completely clear. Unsure if Crt ever returned to baseline after AKI in May.  Previous baseline was 1.5.  No signs of obstruction this admission.  This could just be ATN from intermittent hypotension at home but given confusing picture we did perform serological work-up and 1 could consider biopsy if the patient fails to improve -Follow-up GN labs as they return -Fortunately no indication for dialysis at this time but will have to monitor closely -Continue with IV hydration for now -UPC 1.2g but UA w/ minimal protein; f/u UACR as a discrepancy could be concerning for Bence-Jones Proteinuria -Continue to monitor daily Cr, Dose meds for GFR -Monitor Daily I/Os, Daily weight  -Maintain MAP>65 for optimal renal perfusion.  -Avoid nephrotoxic medications including NSAIDs and Vanc/Zosyn combo -Hold lisinopril   Hyperkalemia: Potassium potassium improved to 5.1 after Lokelma  Metabolic acidosis: Bicarb 20.  Start sodium bicarb 650 mg twice daily.   Tachycardia: Patient is tachycardic and normotensive.  No hypoxia or chest pain.  Given his syncope of pulmonary embolus could be considered but seems unlikely.  D-dimer was elevated.  Defer to primary on further evaluation   Hypertension: Hold lisinopril. Has hydral PRN   HLD: On statin   BPH: Continue home meds as tolerated   Recommendations conveyed to primary service.    [facility] 06/19/2021 9:49 AM  ___________________________________________________________  CC: AKI  Interval History/Subjective: Patient states he feels well today.  Denies any specific  complaints.  Denies any dizziness, lightheadedness, shortness of breath, chest pain.  Urine output has been adequate per patient without any dysuria or hematuria.  Blood pressure slightly high overnight which is now improved.  Hepatitis and HIV negative.  Creatinine minimally improved at 5.8 today.   Medications:  Current Facility-Administered Medications  Medication Dose Route Frequency Provider Last Rate Last Admin   0.9 %  sodium chloride infusion   Intravenous Continuous Richarda Osmond, MD 100 mL/hr at 06/19/21 0439 New Bag at 06/19/21 0439   acetaminophen (TYLENOL) tablet 650 mg  650 mg Oral Q6H PRN Howerter, Justin B, DO       Or   acetaminophen (TYLENOL) suppository 650 mg  650 mg Rectal Q6H PRN Howerter, Justin B, DO       atorvastatin (LIPITOR) tablet 40 mg  40 mg Oral QHS Howerter, Justin B, DO   40 mg at 06/18/21 2208   finasteride (PROSCAR) tablet 5 mg  5 mg Oral QHS Howerter, Justin B, DO   5 mg at 06/18/21 2208   sodium bicarbonate tablet 650 mg  650 mg Oral BID Reesa Chew, MD   650 mg at 06/19/21 5400   tamsulosin (FLOMAX) capsule 0.4 mg  0.4 mg Oral QPC supper Howerter, Justin B, DO   0.4 mg at 06/18/21 1815      Review of Systems: 10 systems reviewed and negative except per interval history/subjective  Physical Exam: Vitals:   06/19/21 0400 06/19/21 0747  BP: 138/64 138/84  Pulse: 74 (!) 109  Resp: 20 20  Temp:  98.7 F (37.1 C)  SpO2:  95%   Total I/O In: 240 [  P.O.:240] Out: 400 [Urine:400]  Intake/Output Summary (Last 24 hours) at 06/19/2021 0949 Last data filed at 06/19/2021 0800 Gross per 24 hour  Intake 2036.08 ml  Output 2350 ml  Net -313.92 ml   Constitutional: well-appearing, no acute distress ENMT: ears and nose without scars or lesions, MMM CV: normal rate, no edema Respiratory: clear to auscultation, normal work of breathing Gastrointestinal: soft, non-tender, no palpable masses or hernias Skin: no visible lesions or rashes Psych:  alert, judgement/insight appropriate, appropriate mood and affect   Test Results I personally reviewed new and old clinical labs and radiology tests Lab Results  Component Value Date   NA 134 (L) 06/19/2021   K 5.1 06/19/2021   CL 107 06/19/2021   CO2 20 (L) 06/19/2021   BUN 40 (H) 06/19/2021   CREATININE 5.83 (H) 06/19/2021   CALCIUM 8.5 (L) 06/19/2021   ALBUMIN 2.6 (L) 06/19/2021   PHOS 5.0 (H) 06/19/2021

## 2021-06-19 NOTE — Progress Notes (Signed)
Pt ambulated in a room and to the bathroom, sat in a recliner for some hours in the afternoon. Denies Chest Pain, distress and SOB. IV fluid is continue. Pt is in RA, running NSR. Will continue to monitor the pt  Palma Holter, RN

## 2021-06-19 NOTE — Evaluation (Addendum)
Physical Therapy Evaluation Patient Details Name: Scott Woods MRN: [id number] DOB: 04/23/1951 Today's Date: 06/19/2021  History of Present Illness  Pt is a 70 y.o. male who presented 05/24/2021 with syncopal episode/fall and AKI on CKD 3a. PMH: HTN, CAD, DM2, PVD, BPH, CKDa   Clinical Impression  Pt presents with condition above and deficits mentioned below, see PT Problem List. PTA, he was mod I using a SPC for mobility (was IND without AD before admission in June 2022), working at [employer], and living alone in a 1-level apartment with 3-4 STE with a handrail. Pt displays tremors, primarily on his L side (UE>lower extremity), which he reports has been present for a while. Currently, pt is displaying a posterior bias with intermittent posterior LOB, needing up to minA during transfers and modA when ambulating without UE support to recover/maintain balance. However, pt benefits from having bil UE support on a RW, only needing min guard assist for safety with transfers and gait bouts up to ~100 ft. He displays deficits in balance, activity tolerance, gross strength, and coordination. He is at risk for falls. Discussed options to get therapy at a facility prior to discharging home, but pt declining SNF even though acknowledging PT education that he is at risk for falls. Thus, recommending follow-up with HHPT, per pt request. However, I feel he could really benefit from short-term rehab in the SNF setting due to the deficits and risks mentioned above and that he has limited support at home. Will continue to follow acutely.     Recommendations for follow up therapy are one component of a multi-disciplinary discharge planning process, led by the attending physician.  Recommendations may be updated based on patient status, additional functional criteria and insurance authorization.  Follow Up Recommendations Home health PT;Supervision for mobility/OOB (would recommend SNF but pt declining it)    Equipment  Recommendations  Other (comment) (tub bench)    Recommendations for Other Services OT consult     Precautions / Restrictions Precautions Precautions: Fall Precaution Comments: monitor vitals Restrictions Weight Bearing Restrictions: No      Mobility  Bed Mobility Overal bed mobility: Needs Assistance Bed Mobility: Supine to Sit;Sit to Supine     Supine to sit: Min guard;HOB elevated Sit to supine: Min guard;HOB elevated   General bed mobility comments: Extra time and effort with transitioning supine > sit L EOB, with pt pulling up on rails to ascend trunk to long-sitting then scooting legs off EOB. Extra time to lift legs back onto bed to return to supine. Min guard assist for safety.    Transfers Overall transfer level: Needs assistance Equipment used: Rolling walker (2 wheeled);None Transfers: Sit to/from Stand Sit to Stand: Min assist;Min guard         General transfer comment: First rep, pt came to stand from low EOB to no UE support with light minA to prevent posterior LOB as he stood up with a posterior bias. Pt resting posterior aspects of legs on bed to compensate for lack of balance. Min guard assist for second rep when using RW.  Ambulation/Gait Ambulation/Gait assistance: Min guard Gait Distance (Feet): 100 Feet Assistive device: Rolling walker (2 wheeled) Gait Pattern/deviations: Step-through pattern;Decreased stride length;Shuffle;Narrow base of support Gait velocity: reduced Gait velocity interpretation: <1.31 ft/sec, indicative of household ambulator General Gait Details: Pt with slow, shuffling gait pattern. Pt tremulous throughout, primarily on L. No overt LOB, min guard assist for safety. Pt with posterior LOB when trying to take steps without UE support,  modA to maintain balance.  Stairs            Wheelchair Mobility    Modified Rankin (Stroke Patients Only) Modified Rankin (Stroke Patients Only) Pre-Morbid Rankin Score: Slight  disability Modified Rankin: Moderately severe disability     Balance Overall balance assessment: Needs assistance Sitting-balance support: No upper extremity supported;Feet supported Sitting balance-Leahy Scale: Fair Sitting balance - Comments: Pt with posterior LOB occasionally, mainly when initially sitting, but able to recover balance without assistance. Postural control: Posterior lean Standing balance support: Bilateral upper extremity supported;During functional activity;No upper extremity supported Standing balance-Leahy Scale: Fair Standing balance comment: Able to stand statically without UE support but benefits from bil UE support for mobility.                             Pertinent Vitals/Pain Pain Assessment: Faces Faces Pain Scale: Hurts little more Pain Location: hips Pain Descriptors / Indicators: Aching Pain Intervention(s): Limited activity within patient's tolerance;Monitored during session;Repositioned    Home Living Family/patient expects to be discharged to:: Private residence Living Arrangements: Alone Available Help at Discharge: Neighbor;Available PRN/intermittently Type of Home: Apartment Home Access: Stairs to enter Entrance Stairs-Rails: Right (ascending) Entrance Stairs-Number of Steps: 3-4 Home Layout: One level Home Equipment: Walker - 2 wheels;Kasandra Knudsen - single point      Prior Function Level of Independence: Independent with assistive device(s)         Comments: Pt was independent without AD before admission in June 2022, in which he then began to use a [facility] for mod I mobility. Pt drives. 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 He used to be in the [employer].     Hand Dominance   Dominant Hand: Right    Extremity/Trunk Assessment   Upper Extremity Assessment Upper Extremity Assessment: LUE deficits/detail;Generalized weakness LUE Deficits / Details: Tremors noted    Lower  Extremity Assessment Lower Extremity Assessment: Generalized weakness;LLE deficits/detail (denied numbness/tingling bil) LLE Deficits / Details: Tremulous on L side LLE Sensation:  (denied numbness/tingling)    Cervical / Trunk Assessment Cervical / Trunk Assessment: Kyphotic  Communication   Communication: No difficulties  Cognition Arousal/Alertness: Awake/alert Behavior During Therapy: WFL for tasks assessed/performed Overall Cognitive Status: No family/caregiver present to determine baseline cognitive functioning                                 General Comments: Pt is easily self-distracted talking about sports. Pt with awareness into his balance deficits but does appear to be limited in comprehension of how it impacts his safety at home alone. This is likely his baseline per chart review from prior admissions.      General Comments General comments (skin integrity, edema, etc.): HR 97-128, BP 124/75 supine, 138/81 sitting, 144/81 standing, denied lightheadedness/dizziness throughout    Exercises     Assessment/Plan    PT Assessment Patient needs continued PT services  PT Problem List Decreased strength;Decreased activity tolerance;Decreased balance;Decreased mobility;Decreased coordination;Decreased safety awareness       PT Treatment Interventions DME instruction;Gait training;Stair training;Functional mobility training;Therapeutic activities;Therapeutic exercise;Balance training;Neuromuscular re-education;Cognitive remediation;Patient/family education    PT Goals (Current goals can be found in the Care Plan section)  Acute Rehab PT Goals Patient Stated Goal: to go home with HHPT PT Goal Formulation: With patient Time For Goal Achievement:  07/03/21 Potential to Achieve Goals: Good    Frequency Min 3X/week   Barriers to discharge Decreased caregiver support      Co-evaluation               AM-PAC PT "6 Clicks" Mobility  Outcome Measure Help  needed turning from your back to your side while in a flat bed without using bedrails?: A Little Help needed moving from lying on your back to sitting on the side of a flat bed without using bedrails?: A Little Help needed moving to and from a bed to a chair (including a wheelchair)?: A Little Help needed standing up from a chair using your arms (e.g., wheelchair or bedside chair)?: A Little Help needed to walk in hospital room?: A Little Help needed climbing 3-5 steps with a railing? : A Lot 6 Click Score: 17    End of Session Equipment Utilized During Treatment: Gait belt Activity Tolerance: Patient tolerated treatment well Patient left: in bed;with call bell/phone within reach;with bed alarm set Nurse Communication: Mobility status;Other (comment) (vitals) PT Visit Diagnosis: Unsteadiness on feet (R26.81);Other abnormalities of gait and mobility (R26.89);Muscle weakness (generalized) (M62.81);History of falling (Z91.81);Difficulty in walking, not elsewhere classified (R26.2)    Time: 7282-0601 PT Time Calculation (min) (ACUTE ONLY): 36 min   Charges:   PT Evaluation $PT Eval Moderate Complexity: 1 Mod PT Treatments $Therapeutic Activity: 8-22 mins        Moishe Spice, PT, DPT Acute Rehabilitation Services  Pager: [phone] Office: [place] 06/19/2021, 6:49 PM

## 2021-06-19 NOTE — Plan of Care (Signed)
Initiation of Care Plan  Problem: Education: Goal: Knowledge of General Education information will improve Description: Including pain rating scale, medication(s)/side effects and non-pharmacologic comfort measures Outcome: Progressing   Problem: Health Behavior/Discharge Planning: Goal: Ability to manage health-related needs will improve Outcome: Progressing   Problem: Clinical Measurements: Goal: Ability to maintain clinical measurements within normal limits will improve Outcome: Progressing Goal: Will remain free from infection Outcome: Progressing Goal: Diagnostic test results will improve Outcome: Progressing Goal: Respiratory complications will improve Outcome: Progressing Goal: Cardiovascular complication will be avoided Outcome: Progressing   Problem: Activity: Goal: Risk for activity intolerance will decrease Outcome: Progressing   Problem: Nutrition: Goal: Adequate nutrition will be maintained Outcome: Progressing   Problem: Coping: Goal: Level of anxiety will decrease Outcome: Progressing   Problem: Elimination: Goal: Will not experience complications related to bowel motility Outcome: Progressing Goal: Will not experience complications related to urinary retention Outcome: Progressing   Problem: Pain Managment: Goal: General experience of comfort will improve Outcome: Progressing   Problem: Safety: Goal: Ability to remain free from injury will improve Outcome: Progressing   Problem: Skin Integrity: Goal: Risk for impaired skin integrity will decrease Outcome: Progressing   Problem: Education: Goal: Knowledge of disease and its progression will improve Outcome: Progressing Goal: Individualized Educational Video(s) Outcome: Progressing   Problem: Fluid Volume: Goal: Compliance with measures to maintain balanced fluid volume will improve Outcome: Progressing   Problem: Health Behavior/Discharge Planning: Goal: Ability to manage health-related  needs will improve Outcome: Progressing   Problem: Nutritional: Goal: Ability to make healthy dietary choices will improve Outcome: Progressing   Problem: Clinical Measurements: Goal: Complications related to the disease process, condition or treatment will be avoided or minimized Outcome: Progressing

## 2021-06-19 NOTE — Telephone Encounter (Signed)
dc'd 06/03/21

## 2021-06-19 NOTE — Progress Notes (Signed)
PROGRESS NOTE  Scott Woods    DOB: May 18, 1951, 70 y.o.  TGG:[id number]  PCP: [name], PA-C   Code Status: Full Code   DOA: 06/06/2021   LOS: 2  Brief Narrative of Current Hospitalization  Scott Woods is a 70 y.o. male with a PMH significant for CKD 4, HTN, HLD, BPH, chronic anemia. They presented from volleyball game to the ED on 06/03/2021 with syncope x1. In the ED, it was found that they had hyperkalemia. They were treated with albuterol, insulin, lokelma, LR.  Patient was admitted to medicine service for further workup and management of ARF and syncope as outlined in detail below.  06/19/21 -stable  Assessment & Plan  Principal Problem:   Acute renal failure superimposed on stage 4 chronic kidney disease (HCC) Active Problems:   Hyperlipidemia LDL goal <100   Hypertension, goal below 140/90   Hyperkalemia   Syncope   Anemia due to chronic kidney disease  Syncope- D-dimer age adjusted to make VTE very unlikely without other respiratory symptoms.  - continue telemetry - IV fluids - PT/OT with orthostatic  Hyperkalemia- resolved. 6.3>6.0>5.7>5.1 s/p Lokelma, albuterol, insulin x2.  In setting of AKI on CKD 4 with creatinine baseline of around 2.7.  Creatinine improving 6.2>6.1>5.8.  - continue normal saline -nephrology following - avoid nephrotoxic agents - w/u for GN - daily Cr -Strict I/O - RFP am  HTN - holding home lisinopril for AKI  BPH - continue home finasteride - continue home flomax  HLD - continue home atorvastatin  Tremor- patient has hand tremor which he attributes to being cold but is consistent with essential tremor. Negative asterixis.  - given the tremor and chronic tachycardia, would like to trial low dose BB this admission but will hold off for further telemetry monitoring for occult block or arrhythmia  DVT prophylaxis: SCDs Start: 05/28/2021 2337   Diet:  Diet Orders (From admission, onward)     Start     Ordered   05/22/2021 2339   Diet regular Room service appropriate? Yes; Fluid consistency: Thin  Diet effective now       Question Answer Comment  Room service appropriate? Yes   Fluid consistency: Thin      06/14/2021 2338            Subjective 06/19/21    Pt has no complaints today. He wants to go back to see the girls at volleyball to let them know he's alright.   Disposition Plan & Communication  Status is: Inpatient  Remains inpatient appropriate because:Persistent severe electrolyte disturbances and Ongoing diagnostic testing needed not appropriate for outpatient work up  Dispo: The patient is from: Home              Anticipated d/c is to: Home              Patient currently is not medically stable to d/c.   Difficult to place patient No  Family Communication: none   Consults, Procedures, Significant Events  Consultants:  nephrology  Procedures/significant events:  none  Antimicrobials:  Anti-infectives (From admission, onward)    None        Objective   Vitals:   06/19/21 0136 06/19/21 0348 06/19/21 0400 06/19/21 0425  BP: (!) 148/75 122/65 138/64   Pulse:  75 74   Resp:  16 20   Temp:  98.3 F (36.8 C)    TempSrc:  Oral    SpO2:  100%    Weight:  69 kg  Height:        Intake/Output Summary (Last 24 hours) at 06/19/2021 0737 Last data filed at 06/19/2021 0400 Gross per 24 hour  Intake 1796.08 ml  Output 1950 ml  Net -153.92 ml   Filed Weights   06/15/2021 2051 06/19/21 0425  Weight: 68 kg 69 kg    Patient BMI: Body mass index is 21.83 kg/m.   Physical Exam: General: awake, alert, NAD HEENT: atraumatic, clear conjunctiva, anicteric sclera, dry mucus membranes, hearing grossly normal Respiratory: CTAB, no wheezes, rales or rhonchi, normal respiratory effort. Cardiovascular: normal S1/S2, tachycardic but regular, no JVD, murmurs, rubs, gallops, quick capillary refill  Gastrointestinal: soft, NT, ND, no HSM felt Nervous: A&O x4. no gross focal neurologic deficits,  normal speech Extremities: moves all equally, no edema, normal tone. Essential tremor bilateral arms Skin: dry, intact, normal temperature, normal color, No rashes, lesions or ulcers. Yellowish tint to skin Psychiatry: normal mood, congruent affect  Labs   I have personally reviewed following labs and imaging studies Admission on 05/29/2021  Component Date Value Ref Range Status   Sodium 05/26/2021 137  135 - 145 mmol/L Final   Potassium 06/06/2021 6.3 (A) 3.5 - 5.1 mmol/L Final   Chloride 05/30/2021 106  98 - 111 mmol/L Final   CO2 06/07/2021 22  22 - 32 mmol/L Final   Glucose, Bld 05/22/2021 124 (A) 70 - 99 mg/dL Final   BUN 06/10/2021 44 (A) 8 - 23 mg/dL Final   Creatinine, Ser 05/30/2021 6.23 (A) 0.61 - 1.24 mg/dL Final   Calcium 06/04/2021 9.6  8.9 - 10.3 mg/dL Final   Total Protein 06/16/2021 6.5  6.5 - 8.1 g/dL Final   Albumin 06/05/2021 3.1 (A) 3.5 - 5.0 g/dL Final   AST 05/26/2021 16  15 - 41 U/L Final   ALT 05/21/2021 18  0 - 44 U/L Final   Alkaline Phosphatase 06/07/2021 66  38 - 126 U/L Final   Total Bilirubin 05/31/2021 0.5  0.3 - 1.2 mg/dL Final   GFR, Estimated 05/20/2021 9 (A) >60 mL/min Final   Anion gap 05/28/2021 9  5 - 15 Final   Troponin I (High Sensitivity) 06/08/2021 5  <18 ng/L Final   WBC 06/18/2021 8.8  4.0 - 10.5 K/uL Final   RBC 06/16/2021 2.56 (A) 4.22 - 5.81 MIL/uL Final   Hemoglobin 05/24/2021 8.2 (A) 13.0 - 17.0 g/dL Final   HCT 06/11/2021 25.6 (A) 39.0 - 52.0 % Final   MCV 06/18/2021 100.0  80.0 - 100.0 fL Final   MCH 05/31/2021 32.0  26.0 - 34.0 pg Final   MCHC 05/28/2021 32.0  30.0 - 36.0 g/dL Final   RDW 05/22/2021 12.8  11.5 - 15.5 % Final   Platelets 06/04/2021 192  150 - 400 K/uL Final   nRBC 05/26/2021 0.0  0.0 - 0.2 % Final   Neutrophils Relative % 05/20/2021 80  % Final   Neutro Abs 05/28/2021 7.0  1.7 - 7.7 K/uL Final   Lymphocytes Relative 06/05/2021 13  % Final   Lymphs Abs 06/02/2021 1.2  0.7 - 4.0 K/uL Final   Monocytes Relative  06/12/2021 5  % Final   Monocytes Absolute 06/16/2021 0.5  0.1 - 1.0 K/uL Final   Eosinophils Relative 06/12/2021 1  % Final   Eosinophils Absolute 05/23/2021 0.1  0.0 - 0.5 K/uL Final   Basophils Relative 06/14/2021 0  % Final   Basophils Absolute 06/09/2021 0.0  0.0 - 0.1 K/uL Final   Immature Granulocytes  06/11/2021 1  % Final   Abs Immature Granulocytes 05/31/2021 0.05  0.00 - 0.07 K/uL Final   Color, Urine 06/18/2021 STRAW (A) YELLOW Final   APPearance 06/18/2021 CLEAR  CLEAR Final   Specific Gravity, Urine 06/18/2021 1.010  1.005 - 1.030 Final   pH 06/18/2021 8.0  5.0 - 8.0 Final   Glucose, UA 06/18/2021 150 (A) NEGATIVE mg/dL Final   Hgb urine dipstick 06/18/2021 NEGATIVE  NEGATIVE Final   Bilirubin Urine 06/18/2021 NEGATIVE  NEGATIVE Final   Ketones, ur 06/18/2021 NEGATIVE  NEGATIVE mg/dL Final   Protein, ur 06/18/2021 30 (A) NEGATIVE mg/dL Final   Nitrite 06/18/2021 NEGATIVE  NEGATIVE Final   Leukocytes,Ua 06/18/2021 NEGATIVE  NEGATIVE Final   WBC, UA 06/18/2021 0-5  0 - 5 WBC/hpf Final   Bacteria, UA 06/18/2021 NONE SEEN  NONE SEEN Final   Lactic Acid, Venous 05/27/2021 0.9  0.5 - 1.9 mmol/L Final   Lactic Acid, Venous 06/18/2021 0.9  0.5 - 1.9 mmol/L Final   Troponin I (High Sensitivity) 06/18/2021 5  <18 ng/L Final   Glucose-Capillary 06/18/2021 109 (A) 70 - 99 mg/dL Final   SARS Coronavirus 2 by RT PCR 06/18/2021 NEGATIVE  NEGATIVE Final   Influenza A by PCR 06/18/2021 NEGATIVE  NEGATIVE Final   Influenza B by PCR 06/18/2021 NEGATIVE  NEGATIVE Final   Phosphorus 06/18/2021 4.3  2.5 - 4.6 mg/dL Final   Magnesium 06/18/2021 2.1  1.7 - 2.4 mg/dL Final   Magnesium 06/18/2021 2.1  1.7 - 2.4 mg/dL Final   Sodium 06/18/2021 136  135 - 145 mmol/L Final   Potassium 06/18/2021 6.0 (A) 3.5 - 5.1 mmol/L Final   Chloride 06/18/2021 107  98 - 111 mmol/L Final   CO2 06/18/2021 20 (A) 22 - 32 mmol/L Final   Glucose, Bld 06/18/2021 115 (A) 70 - 99 mg/dL Final   BUN 06/18/2021 40 (A) 8  - 23 mg/dL Final   Creatinine, Ser 06/18/2021 6.13 (A) 0.61 - 1.24 mg/dL Final   Calcium 06/18/2021 9.3  8.9 - 10.3 mg/dL Final   Total Protein 06/18/2021 6.0 (A) 6.5 - 8.1 g/dL Final   Albumin 06/18/2021 2.9 (A) 3.5 - 5.0 g/dL Final   AST 06/18/2021 15  15 - 41 U/L Final   ALT 06/18/2021 16  0 - 44 U/L Final   Alkaline Phosphatase 06/18/2021 63  38 - 126 U/L Final   Total Bilirubin 06/18/2021 1.0  0.3 - 1.2 mg/dL Final   GFR, Estimated 06/18/2021 9 (A) >60 mL/min Final   Anion gap 06/18/2021 9  5 - 15 Final   WBC 06/18/2021 7.0  4.0 - 10.5 K/uL Final   RBC 06/18/2021 2.37 (A) 4.22 - 5.81 MIL/uL Final   Hemoglobin 06/18/2021 7.7 (A) 13.0 - 17.0 g/dL Final   HCT 06/18/2021 23.2 (A) 39.0 - 52.0 % Final   MCV 06/18/2021 97.9  80.0 - 100.0 fL Final   MCH 06/18/2021 32.5  26.0 - 34.0 pg Final   MCHC 06/18/2021 33.2  30.0 - 36.0 g/dL Final   RDW 06/18/2021 12.7  11.5 - 15.5 % Final   Platelets 06/18/2021 158  150 - 400 K/uL Final   nRBC 06/18/2021 0.3 (A) 0.0 - 0.2 % Final   Total CK 06/18/2021 56  49 - 397 U/L Final   Creatinine, Urine 06/18/2021 54.18  mg/dL Final   Total Protein, Urine 06/18/2021 67  mg/dL Final   Protein Creatinine Ratio 06/18/2021 1.24 (A) 0.00 - 0.15 mg/mg[Cre] Final   Sodium,  Ur 06/18/2021 109  mmol/L Final   Creatinine, Urine 06/18/2021 54.61  mg/dL Final   Prothrombin Time 06/18/2021 14.6  11.4 - 15.2 seconds Final   INR 06/18/2021 1.1  0.8 - 1.2 Final   Troponin I (High Sensitivity) 06/18/2021 5  <18 ng/L Final   Sodium 06/18/2021 135  135 - 145 mmol/L Final   Potassium 06/18/2021 5.7 (A) 3.5 - 5.1 mmol/L Final   Chloride 06/18/2021 108  98 - 111 mmol/L Final   CO2 06/18/2021 20 (A) 22 - 32 mmol/L Final   Glucose, Bld 06/18/2021 128 (A) 70 - 99 mg/dL Final   BUN 06/18/2021 41 (A) 8 - 23 mg/dL Final   Creatinine, Ser 06/18/2021 5.97 (A) 0.61 - 1.24 mg/dL Final   Calcium 06/18/2021 8.9  8.9 - 10.3 mg/dL Final   GFR, Estimated 06/18/2021 10 (A) >60 mL/min Final    Anion gap 06/18/2021 7  5 - 15 Final   Creatinine, Urine 06/18/2021 45.87  mg/dL Final   Total Protein, Urine 06/18/2021 56  mg/dL Final   Protein Creatinine Ratio 06/18/2021 1.22 (A) 0.00 - 0.15 mg/mg[Cre] Final   Hepatitis B Surface Ag 06/18/2021 NON REACTIVE  NON REACTIVE Final   HCV Ab 06/18/2021 NON REACTIVE  NON REACTIVE Final   Hep A IgM 06/18/2021 NON REACTIVE  NON REACTIVE Final   Hep B C IgM 06/18/2021 NON REACTIVE  NON REACTIVE Final   HIV Screen 4th Generation wRfx 06/18/2021 Non Reactive  Non Reactive Final   D-Dimer, Quant 06/18/2021 1.71 (A) 0.00 - 0.50 ug/mL-FEU Final   Sodium 06/19/2021 134 (A) 135 - 145 mmol/L Final   Potassium 06/19/2021 5.1  3.5 - 5.1 mmol/L Final   Chloride 06/19/2021 107  98 - 111 mmol/L Final   CO2 06/19/2021 20 (A) 22 - 32 mmol/L Final   Glucose, Bld 06/19/2021 116 (A) 70 - 99 mg/dL Final   BUN 06/19/2021 40 (A) 8 - 23 mg/dL Final   Creatinine, Ser 06/19/2021 5.83 (A) 0.61 - 1.24 mg/dL Final   Calcium 06/19/2021 8.5 (A) 8.9 - 10.3 mg/dL Final   Phosphorus 06/19/2021 5.0 (A) 2.5 - 4.6 mg/dL Final   Albumin 06/19/2021 2.6 (A) 3.5 - 5.0 g/dL Final   GFR, Estimated 06/19/2021 10 (A) >60 mL/min Final   Anion gap 06/19/2021 7  5 - 15 Final   MRSA by PCR Next Gen 06/18/2021 NOT DETECTED  NOT DETECTED Final     Recent Results (from the past 240 hour(s))  Resp Panel by RT-PCR (Flu A&B, Covid) Nasopharyngeal Swab     Status: None   Collection Time: 06/18/21  1:25 AM   Specimen: Nasopharyngeal Swab; Nasopharyngeal(NP) swabs in vial transport medium  Result Value Ref Range Status   SARS Coronavirus 2 by RT PCR NEGATIVE NEGATIVE Final    Comment: (NOTE) SARS-CoV-2 target nucleic acids are NOT DETECTED.  The SARS-CoV-2 RNA is generally detectable in upper respiratory specimens during the acute phase of infection. The lowest concentration of SARS-CoV-2 viral copies this assay can detect is 138 copies/mL. A negative result does not preclude  SARS-Cov-2 infection and should not be used as the sole basis for treatment or other patient management decisions. A negative result may occur with  improper specimen collection/handling, submission of specimen other than nasopharyngeal swab, presence of viral mutation(s) within the areas targeted by this assay, and inadequate number of viral copies(<138 copies/mL). A negative result must be combined with clinical observations, patient history, and epidemiological information. The expected result is Negative.  Fact Sheet for Patients:  EntrepreneurPulse.com.au  Fact Sheet for Healthcare Providers:  IncredibleEmployment.be  This test is no t yet approved or cleared by the [place] FDA and  has been authorized for detection and/or diagnosis of SARS-CoV-2 by FDA under an Emergency Use Authorization (EUA). This EUA will remain  in effect (meaning this test can be used) for the duration of the COVID-19 declaration under Section 564(b)(1) of the Act, 21 U.S.C.section 360bbb-3(b)(1), unless the authorization is terminated  or revoked sooner.       Influenza A by PCR NEGATIVE NEGATIVE Final   Influenza B by PCR NEGATIVE NEGATIVE Final    Comment: (NOTE) The Xpert Xpress SARS-CoV-2/FLU/RSV plus assay is intended as an aid in the diagnosis of influenza from Nasopharyngeal swab specimens and should not be used as a sole basis for treatment. Nasal washings and aspirates are unacceptable for Xpert Xpress SARS-CoV-2/FLU/RSV testing.  Fact Sheet for Patients: EntrepreneurPulse.com.au  Fact Sheet for Healthcare Providers: IncredibleEmployment.be  This test is not yet approved or cleared by the [place] FDA and has been authorized for detection and/or diagnosis of SARS-CoV-2 by FDA under an Emergency Use Authorization (EUA). This EUA will remain in effect (meaning this test can be used) for the duration of  the COVID-19 declaration under Section 564(b)(1) of the Act, 21 U.S.C. section 360bbb-3(b)(1), unless the authorization is terminated or revoked.  Performed at [facility], [place] [address], [place], [place] [address]   MRSA Next Gen by PCR, Nasal     Status: None   Collection Time: 06/18/21  7:01 PM   Specimen: Nasal Mucosa; Nasal Swab  Result Value Ref Range Status   MRSA by PCR Next Gen NOT DETECTED NOT DETECTED Final    Comment: (NOTE) The GeneXpert MRSA Assay (FDA approved for NASAL specimens only), is one component of a comprehensive MRSA colonization surveillance program. It is not intended to diagnose MRSA infection nor to guide or monitor treatment for MRSA infections. Test performance is not FDA approved in patients less than 58 years old. Performed at [facility], [place] [address], [place], [place] [address]      Imaging Studies  VAS US CAROTID  Result Date: 06/18/2021 Carotid Arterial Duplex Study Patient Name:  LY BACCHI  Date of Exam:   06/18/2021 Medical Rec #: [id number]       Accession #:    [id number] Date of Birth: 01-Dec-1950       Patient Gender: M Patient Age:   16 years Exam Location:  [facility] Procedure:      VAS US CAROTID Referring Phys: Babs Bertin --------------------------------------------------------------------------------  Indications:       Syncope. Risk Factors:      Hypertension, hyperlipidemia, Diabetes. Comparison Study:  no prior Performing Technologist: Archie Patten RVS  Examination Guidelines: A complete evaluation includes B-mode imaging, spectral Doppler, color Doppler, and power Doppler as needed of all accessible portions of each vessel. Bilateral testing is considered an integral part of a complete examination. Limited examinations for reoccurring indications may be performed as noted.  Right Carotid Findings: +----------+--------+--------+--------+------------------+------------------+           PSV cm/sEDV  cm/sStenosisPlaque DescriptionComments           +----------+--------+--------+--------+------------------+------------------+ CCA Prox  89      9  intimal thickening +----------+--------+--------+--------+------------------+------------------+ CCA Distal63      12                                intimal thickening +----------+--------+--------+--------+------------------+------------------+ ICA Prox  52      16      1-39%   heterogenous                         +----------+--------+--------+--------+------------------+------------------+ ICA Distal52      26                                                   +----------+--------+--------+--------+------------------+------------------+ ECA       67                      heterogenous                         +----------+--------+--------+--------+------------------+------------------+ +----------+--------+-------+----------------+-------------------+           PSV cm/sEDV cmsDescribe        Arm Pressure (mmHG) +----------+--------+-------+----------------+-------------------+ [id number]             Multiphasic, WNL                    +----------+--------+-------+----------------+-------------------+ +---------+--------+--+--------+--+---------+ VertebralPSV cm/s40EDV cm/s11Antegrade +---------+--------+--+--------+--+---------+  Left Carotid Findings: +----------+--------+--------+--------+------------------+------------------+           PSV cm/sEDV cm/sStenosisPlaque DescriptionComments           +----------+--------+--------+--------+------------------+------------------+ CCA Prox  72      14                                intimal thickening +----------+--------+--------+--------+------------------+------------------+ CCA Distal70      14                                intimal thickening  +----------+--------+--------+--------+------------------+------------------+ ICA Prox  91      19      1-39%   heterogenous                         +----------+--------+--------+--------+------------------+------------------+ ICA Distal43      16                                                   +----------+--------+--------+--------+------------------+------------------+ ECA       98      13              heterogenous                         +----------+--------+--------+--------+------------------+------------------+ +----------+--------+--------+----------------+-------------------+           PSV cm/sEDV cm/sDescribe        Arm Pressure (mmHG) +----------+--------+--------+----------------+-------------------+ [id number]              Multiphasic, WNL                    +----------+--------+--------+----------------+-------------------+ +---------+--------+--------+--------------+  [place] cm/sEDV cm/sNot identified +---------+--------+--------+--------------+   Summary: Right Carotid: Velocities in the right ICA are consistent with a 1-39% stenosis. Left Carotid: Velocities in the left ICA are consistent with a 1-39% stenosis. Vertebrals:  Right vertebral artery demonstrates antegrade flow. Left vertebral              artery was not visualized. Subclavians: Normal flow hemodynamics were seen in bilateral subclavian              arteries. *See table(s) above for measurements and observations.     Preliminary    Medications   Scheduled Meds:  atorvastatin  40 mg Oral QHS   finasteride  5 mg Oral QHS   sodium bicarbonate  650 mg Oral BID   tamsulosin  0.4 mg Oral QPC supper     LOS: 2 days   Time spent: >23min  Levy Wellman L Birdie Fetty, DO [facility] 06/19/2021, 7:37 AM   To contact the [facility] Attending or Consulting provider for this patient: Check the care team in [facility] for a) attending/consulting [place] provider listed and b) the [facility] team listed Log into  [email or web address] and use [facility]'s universal password to access. If you do not have the password, please contact the hospital operator. Locate the [facility] provider you are looking for under [facility] and page to a number that you can be directly reached. If you still have difficulty reaching the provider, please page the [facility] (Director on Call) for the Hospitalists listed on amion for assistance.

## 2021-06-19 DEATH — deceased

## 2021-06-20 ENCOUNTER — Inpatient Hospital Stay (HOSPITAL_COMMUNITY): Payer: Medicare (Managed Care)

## 2021-06-20 DIAGNOSIS — R55 Syncope and collapse: Secondary | ICD-10-CM

## 2021-06-20 DIAGNOSIS — N183 Chronic kidney disease, stage 3 unspecified: Secondary | ICD-10-CM | POA: Diagnosis not present

## 2021-06-20 DIAGNOSIS — E875 Hyperkalemia: Secondary | ICD-10-CM | POA: Diagnosis not present

## 2021-06-20 DIAGNOSIS — N179 Acute kidney failure, unspecified: Secondary | ICD-10-CM | POA: Diagnosis not present

## 2021-06-20 DIAGNOSIS — W19XXXA Unspecified fall, initial encounter: Secondary | ICD-10-CM | POA: Diagnosis not present

## 2021-06-20 LAB — RENAL FUNCTION PANEL
Albumin: 2.6 g/dL — ABNORMAL LOW (ref 3.5–5.0)
Anion gap: 7 (ref 5–15)
BUN: 41 mg/dL — ABNORMAL HIGH (ref 8–23)
CO2: 18 mmol/L — ABNORMAL LOW (ref 22–32)
Calcium: 8.5 mg/dL — ABNORMAL LOW (ref 8.9–10.3)
Chloride: 111 mmol/L (ref 98–111)
Creatinine, Ser: 5.62 mg/dL — ABNORMAL HIGH (ref 0.61–1.24)
GFR, Estimated: 10 mL/min — ABNORMAL LOW (ref >60)
Glucose, Bld: 101 mg/dL — ABNORMAL HIGH (ref 70–99)
Phosphorus: 5.5 mg/dL — ABNORMAL HIGH (ref 2.5–4.6)
Potassium: 4.9 mmol/L (ref 3.5–5.1)
Sodium: 136 mmol/L (ref 135–145)

## 2021-06-20 LAB — ECHOCARDIOGRAM COMPLETE
AR max vel: 2.74 cm2
AV Area VTI: 2.82 cm2
AV Area mean vel: 2.82 cm2
AV Mean grad: 2 mmHg
AV Peak grad: 4.2 mmHg
Ao pk vel: 1.02 m/s
Area-P 1/2: 4.01 cm2
Height: 70 in
P 1/2 time: 312 msec
S' Lateral: 3.1 cm
Weight: 2433.88 oz

## 2021-06-20 MED ORDER — LORAZEPAM 2 MG/ML IJ SOLN
0.2500 mg | Freq: Once | INTRAMUSCULAR | Status: AC
  Administered 2021-06-20: 0.25 mg via INTRAVENOUS
  Filled 2021-06-20: qty 1

## 2021-06-20 MED ORDER — CARVEDILOL 3.125 MG PO TABS
3.1250 mg | ORAL_TABLET | Freq: Two times a day (BID) | ORAL | Status: DC
  Administered 2021-06-20 – 2021-06-21 (×3): 3.125 mg via ORAL
  Filled 2021-06-20 (×3): qty 1

## 2021-06-20 MED ORDER — SODIUM BICARBONATE 650 MG PO TABS
1300.0000 mg | ORAL_TABLET | Freq: Two times a day (BID) | ORAL | Status: DC
  Administered 2021-06-20 – 2021-06-21 (×4): 1300 mg via ORAL
  Filled 2021-06-20 (×5): qty 2

## 2021-06-20 MED ORDER — HALOPERIDOL LACTATE 5 MG/ML IJ SOLN
2.0000 mg | Freq: Once | INTRAMUSCULAR | Status: AC
  Administered 2021-06-20: 2 mg via INTRAVENOUS
  Filled 2021-06-20: qty 1

## 2021-06-20 NOTE — Progress Notes (Signed)
  Echocardiogram 2D Echocardiogram has been performed.  Scott Woods 06/20/2021, 2:52 PM

## 2021-06-20 NOTE — Progress Notes (Signed)
OT Cancellation Note  Patient Details Name: Scott Woods MRN: [id number] DOB: 09-08-1951   Cancelled Treatment:    Reason Eval/Treat Not Completed: Patient at procedure or test/ unavailable (ECHO)  [name],[name] 06/20/2021, 4:43 PM Maurie Boettcher, OT/L   Acute OT Clinical Specialist Acute Rehabilitation Services Pager [phone] Office [phone]

## 2021-06-20 NOTE — Progress Notes (Signed)
Release the restraint from the pt's hand this morning at 0724 which was applied from the night shift. Per report he had some episodes of confusion overnight.  Pt complained his phone is lost, I searched all his bed and every belongings with the help from night shift RN Vinnie Level, could not find it, calling sister to know if she knows about it. Last time I saw yesterday pt was using the phone.  Will keep looking for it At this moment pt is eating his breakfast and alert to place, person and situation.  Will continue to monitor it  Palma Holter, Therapist, sports

## 2021-06-20 NOTE — Progress Notes (Signed)
Pt's family friends are here in bed side, updated the pt's condition. They are concerned about his phone and wallet. Pt's sister was called by her family friend and she confirmed that she took the wallet but not the phone.  I took the pt out from bed to the recliner chair, I looked everywhere in the bed, belongings and drawers with the help of NT Elmyra Ricks and spend good time looking around, could not find the phone  Will call the security to complete a missing personal belongings, will call laundry to see if they found there and will complete safety Zone  Palma Holter, RN

## 2021-06-20 NOTE — Progress Notes (Signed)
PROGRESS NOTE  Scott Woods    DOB: 1951-08-28, 70 y.o.  QZE:[id number]  PCP: [name], PA-C   Code Status: Full Code   DOA: 05/31/2021   LOS: 3  Brief Narrative of Current Hospitalization  Scott Woods is a 70 y.o. male with a PMH significant for CKD 4, HTN, HLD, BPH, chronic anemia. They presented from volleyball game to the ED on 06/06/2021 with syncope x1. In the ED, it was found that they had hyperkalemia. They were treated with albuterol, insulin, lokelma, LR.  Patient was admitted to medicine service for further workup and management of ARF and syncope as outlined in detail below.  06/20/21 -stable  Assessment & Plan  Principal Problem:   Acute renal failure superimposed on stage 4 chronic kidney disease (HCC) Active Problems:   Hyperlipidemia LDL goal <100   Hypertension, goal below 140/90   Hyperkalemia   Syncope   Anemia due to chronic kidney disease  Delirium-acute episode of delirium overnight.  Received Ativan x1 as well as haloperidol and soft hand restraints. resolved by the time that I saw him this morning. Likely exacerbated by being attached to several medical monitors and IV and not having his eyeglasses or view of the sun at window. -Contact sister to get his glasses -Sitting up in sunlight and/or walking around the unit during the day  Syncope- D-dimer age adjusted to make VTE very unlikely without other respiratory symptoms.  No further episodes during admission.  Telemetry showing only sinus tachycardia without other abnormal arrhythmias. - continue telemetry - IV fluids - PT/OT with orthostatic -Trial of low-dose beta-blocker today in setting of sinus tachycardia essential tremor-carvedilol 3.125 twice daily  Hyperkalemia- resolved. 6.3>6.0>5.7>5.1>4.9 s/p Lokelma, albuterol, insulin x2.  In setting of AKI on CKD 4 with creatinine baseline of around 2.7.  Creatinine improving 6.2>6.1>5.8>5.6.  - continue normal saline -nephrology following - avoid  nephrotoxic agents - w/u for GN continuing - daily Cr -Strict I/O - RFP am  HTN- intermittently elevated - holding home lisinopril for AKI  BPH - continue home finasteride - continue home flomax  HLD - continue home atorvastatin  Tremor- patient has hand tremor which he attributes to being cold but is consistent with essential tremor. Negative asterixis.  - given the tremor and chronic tachycardia, would like to trial low dose BB this admission but will hold off for further telemetry monitoring for occult block or arrhythmia  DVT prophylaxis: SCDs Start: 06/14/2021 2337   Diet:  Diet Orders (From admission, onward)     Start     Ordered   06/16/2021 2339  Diet regular Room service appropriate? Yes; Fluid consistency: Thin  Diet effective now       Question Answer Comment  Room service appropriate? Yes   Fluid consistency: Thin      06/02/2021 2338            Subjective 06/20/21    Pt has no complaints today. He wants to go back to see the girls at volleyball to let them know he's alright.   Disposition Plan & Communication  Status is: Inpatient  Remains inpatient appropriate because:Persistent severe electrolyte disturbances and Ongoing diagnostic testing needed not appropriate for outpatient work up  Dispo: The patient is from: Home              Anticipated d/c is to: Home              Patient currently is not medically stable to d/c.  Difficult to place patient No  Family Communication: none   Consults, Procedures, Significant Events  Consultants:  nephrology  Procedures/significant events:  none  Antimicrobials:  Anti-infectives (From admission, onward)    None        Objective   Vitals:   06/19/21 1125 06/19/21 1517 06/19/21 2005 06/19/21 2335  BP: (!) 149/90 (!) 150/89 134/82 136/89  Pulse: 100 (!) 102 88 95  Resp: 20 18 15 17   Temp: 98.7 F (37.1 C) 98.7 F (37.1 C) 98.6 F (37 C) 98 F (36.7 C)  TempSrc: Oral Oral Oral Oral  SpO2:  97% 100% 100% 99%  Weight:      Height:        Intake/Output Summary (Last 24 hours) at 06/20/2021 0713 Last data filed at 06/19/2021 1200 Gross per 24 hour  Intake 480 ml  Output 1100 ml  Net -620 ml    Filed Weights   05/30/2021 2051 06/19/21 0425  Weight: 68 kg 69 kg    Patient BMI: Body mass index is 21.83 kg/m.   Physical Exam: General: awake, alert, NAD HEENT: atraumatic, clear conjunctiva, anicteric sclera, dry mucus membranes, hearing grossly normal Respiratory: CTAB, no wheezes, rales or rhonchi, normal respiratory effort. Cardiovascular: normal S1/S2, tachycardic but regular, no JVD, murmurs, rubs, gallops, quick capillary refill  Gastrointestinal: soft, NT, ND, no HSM felt Nervous: A&O x4. no gross focal neurologic deficits, normal speech Extremities: moves all equally, no edema, normal tone. Essential tremor bilateral arms Skin: dry, intact, normal temperature, normal color, No rashes, lesions or ulcers. Yellowish tint to skin Psychiatry: normal mood, congruent affect  Labs   I have personally reviewed following labs and imaging studies Admission on 05/21/2021  Component Date Value Ref Range Status   Sodium 06/14/2021 137  135 - 145 mmol/L Final   Potassium 06/05/2021 6.3 (A) 3.5 - 5.1 mmol/L Final   Chloride 06/14/2021 106  98 - 111 mmol/L Final   CO2 06/14/2021 22  22 - 32 mmol/L Final   Glucose, Bld 05/29/2021 124 (A) 70 - 99 mg/dL Final   BUN 06/02/2021 44 (A) 8 - 23 mg/dL Final   Creatinine, Ser 06/03/2021 6.23 (A) 0.61 - 1.24 mg/dL Final   Calcium 06/13/2021 9.6  8.9 - 10.3 mg/dL Final   Total Protein 06/08/2021 6.5  6.5 - 8.1 g/dL Final   Albumin 06/11/2021 3.1 (A) 3.5 - 5.0 g/dL Final   AST 06/14/2021 16  15 - 41 U/L Final   ALT 06/15/2021 18  0 - 44 U/L Final   Alkaline Phosphatase 06/13/2021 66  38 - 126 U/L Final   Total Bilirubin 05/28/2021 0.5  0.3 - 1.2 mg/dL Final   GFR, Estimated 05/31/2021 9 (A) >60 mL/min Final   Anion gap 06/18/2021 9  5 -  15 Final   Troponin I (High Sensitivity) 06/12/2021 5  <18 ng/L Final   WBC 06/06/2021 8.8  4.0 - 10.5 K/uL Final   RBC 06/12/2021 2.56 (A) 4.22 - 5.81 MIL/uL Final   Hemoglobin 05/24/2021 8.2 (A) 13.0 - 17.0 g/dL Final   HCT 06/02/2021 25.6 (A) 39.0 - 52.0 % Final   MCV 05/23/2021 100.0  80.0 - 100.0 fL Final   MCH 06/08/2021 32.0  26.0 - 34.0 pg Final   MCHC 06/02/2021 32.0  30.0 - 36.0 g/dL Final   RDW 06/03/2021 12.8  11.5 - 15.5 % Final   Platelets 05/25/2021 192  150 - 400 K/uL Final   nRBC 06/11/2021 0.0  0.0 - 0.2 %  Final   Neutrophils Relative % 06/05/2021 80  % Final   Neutro Abs 05/25/2021 7.0  1.7 - 7.7 K/uL Final   Lymphocytes Relative 05/22/2021 13  % Final   Lymphs Abs 05/25/2021 1.2  0.7 - 4.0 K/uL Final   Monocytes Relative 05/28/2021 5  % Final   Monocytes Absolute 06/18/2021 0.5  0.1 - 1.0 K/uL Final   Eosinophils Relative 06/15/2021 1  % Final   Eosinophils Absolute 06/10/2021 0.1  0.0 - 0.5 K/uL Final   Basophils Relative 06/04/2021 0  % Final   Basophils Absolute 05/28/2021 0.0  0.0 - 0.1 K/uL Final   Immature Granulocytes 05/30/2021 1  % Final   Abs Immature Granulocytes 05/31/2021 0.05  0.00 - 0.07 K/uL Final   Color, Urine 06/18/2021 STRAW (A) YELLOW Final   APPearance 06/18/2021 CLEAR  CLEAR Final   Specific Gravity, Urine 06/18/2021 1.010  1.005 - 1.030 Final   pH 06/18/2021 8.0  5.0 - 8.0 Final   Glucose, UA 06/18/2021 150 (A) NEGATIVE mg/dL Final   Hgb urine dipstick 06/18/2021 NEGATIVE  NEGATIVE Final   Bilirubin Urine 06/18/2021 NEGATIVE  NEGATIVE Final   Ketones, ur 06/18/2021 NEGATIVE  NEGATIVE mg/dL Final   Protein, ur 06/18/2021 30 (A) NEGATIVE mg/dL Final   Nitrite 06/18/2021 NEGATIVE  NEGATIVE Final   Leukocytes,Ua 06/18/2021 NEGATIVE  NEGATIVE Final   WBC, UA 06/18/2021 0-5  0 - 5 WBC/hpf Final   Bacteria, UA 06/18/2021 NONE SEEN  NONE SEEN Final   Lactic Acid, Venous 05/27/2021 0.9  0.5 - 1.9 mmol/L Final   Lactic Acid, Venous 06/18/2021  0.9  0.5 - 1.9 mmol/L Final   Troponin I (High Sensitivity) 06/18/2021 5  <18 ng/L Final   Glucose-Capillary 06/18/2021 109 (A) 70 - 99 mg/dL Final   SARS Coronavirus 2 by RT PCR 06/18/2021 NEGATIVE  NEGATIVE Final   Influenza A by PCR 06/18/2021 NEGATIVE  NEGATIVE Final   Influenza B by PCR 06/18/2021 NEGATIVE  NEGATIVE Final   Phosphorus 06/18/2021 4.3  2.5 - 4.6 mg/dL Final   Magnesium 06/18/2021 2.1  1.7 - 2.4 mg/dL Final   Magnesium 06/18/2021 2.1  1.7 - 2.4 mg/dL Final   Sodium 06/18/2021 136  135 - 145 mmol/L Final   Potassium 06/18/2021 6.0 (A) 3.5 - 5.1 mmol/L Final   Chloride 06/18/2021 107  98 - 111 mmol/L Final   CO2 06/18/2021 20 (A) 22 - 32 mmol/L Final   Glucose, Bld 06/18/2021 115 (A) 70 - 99 mg/dL Final   BUN 06/18/2021 40 (A) 8 - 23 mg/dL Final   Creatinine, Ser 06/18/2021 6.13 (A) 0.61 - 1.24 mg/dL Final   Calcium 06/18/2021 9.3  8.9 - 10.3 mg/dL Final   Total Protein 06/18/2021 6.0 (A) 6.5 - 8.1 g/dL Final   Albumin 06/18/2021 2.9 (A) 3.5 - 5.0 g/dL Final   AST 06/18/2021 15  15 - 41 U/L Final   ALT 06/18/2021 16  0 - 44 U/L Final   Alkaline Phosphatase 06/18/2021 63  38 - 126 U/L Final   Total Bilirubin 06/18/2021 1.0  0.3 - 1.2 mg/dL Final   GFR, Estimated 06/18/2021 9 (A) >60 mL/min Final   Anion gap 06/18/2021 9  5 - 15 Final   WBC 06/18/2021 7.0  4.0 - 10.5 K/uL Final   RBC 06/18/2021 2.37 (A) 4.22 - 5.81 MIL/uL Final   Hemoglobin 06/18/2021 7.7 (A) 13.0 - 17.0 g/dL Final   HCT 06/18/2021 23.2 (A) 39.0 - 52.0 % Final  MCV 06/18/2021 97.9  80.0 - 100.0 fL Final   MCH 06/18/2021 32.5  26.0 - 34.0 pg Final   MCHC 06/18/2021 33.2  30.0 - 36.0 g/dL Final   RDW 06/18/2021 12.7  11.5 - 15.5 % Final   Platelets 06/18/2021 158  150 - 400 K/uL Final   nRBC 06/18/2021 0.3 (A) 0.0 - 0.2 % Final   Total CK 06/18/2021 56  49 - 397 U/L Final   Creatinine, Urine 06/18/2021 54.18  mg/dL Final   Total Protein, Urine 06/18/2021 67  mg/dL Final   Protein Creatinine Ratio  06/18/2021 1.24 (A) 0.00 - 0.15 mg/mg[Cre] Final   Sodium, Ur 06/18/2021 109  mmol/L Final   Creatinine, Urine 06/18/2021 54.61  mg/dL Final   Prothrombin Time 06/18/2021 14.6  11.4 - 15.2 seconds Final   INR 06/18/2021 1.1  0.8 - 1.2 Final   Prolactin 06/18/2021 8.5  4.0 - 15.2 ng/mL Final   Troponin I (High Sensitivity) 06/18/2021 5  <18 ng/L Final   Sodium 06/18/2021 135  135 - 145 mmol/L Final   Potassium 06/18/2021 5.7 (A) 3.5 - 5.1 mmol/L Final   Chloride 06/18/2021 108  98 - 111 mmol/L Final   CO2 06/18/2021 20 (A) 22 - 32 mmol/L Final   Glucose, Bld 06/18/2021 128 (A) 70 - 99 mg/dL Final   BUN 06/18/2021 41 (A) 8 - 23 mg/dL Final   Creatinine, Ser 06/18/2021 5.97 (A) 0.61 - 1.24 mg/dL Final   Calcium 06/18/2021 8.9  8.9 - 10.3 mg/dL Final   GFR, Estimated 06/18/2021 10 (A) >60 mL/min Final   Anion gap 06/18/2021 7  5 - 15 Final   Microalb, Ur 06/18/2021 26.3 (A) Not Estab. ug/mL Final   Microalb Creat Ratio 06/18/2021 65 (A) 0 - 29 mg/g creat Final   Creatinine, Urine 06/18/2021 40.7  Not Estab. mg/dL Final   Creatinine, Urine 06/18/2021 45.87  mg/dL Final   Total Protein, Urine 06/18/2021 56  mg/dL Final   Protein Creatinine Ratio 06/18/2021 1.22 (A) 0.00 - 0.15 mg/mg[Cre] Final   Hepatitis B Surface Ag 06/18/2021 NON REACTIVE  NON REACTIVE Final   HCV Ab 06/18/2021 NON REACTIVE  NON REACTIVE Final   Hep A IgM 06/18/2021 NON REACTIVE  NON REACTIVE Final   Hep B C IgM 06/18/2021 NON REACTIVE  NON REACTIVE Final   HIV Screen 4th Generation wRfx 06/18/2021 Non Reactive  Non Reactive Final   RPR Ser Ql 06/18/2021 NON REACTIVE  NON REACTIVE Final   D-Dimer, Quant 06/18/2021 1.71 (A) 0.00 - 0.50 ug/mL-FEU Final   Sodium 06/19/2021 134 (A) 135 - 145 mmol/L Final   Potassium 06/19/2021 5.1  3.5 - 5.1 mmol/L Final   Chloride 06/19/2021 107  98 - 111 mmol/L Final   CO2 06/19/2021 20 (A) 22 - 32 mmol/L Final   Glucose, Bld 06/19/2021 116 (A) 70 - 99 mg/dL Final   BUN 06/19/2021 40  (A) 8 - 23 mg/dL Final   Creatinine, Ser 06/19/2021 5.83 (A) 0.61 - 1.24 mg/dL Final   Calcium 06/19/2021 8.5 (A) 8.9 - 10.3 mg/dL Final   Phosphorus 06/19/2021 5.0 (A) 2.5 - 4.6 mg/dL Final   Albumin 06/19/2021 2.6 (A) 3.5 - 5.0 g/dL Final   GFR, Estimated 06/19/2021 10 (A) >60 mL/min Final   Anion gap 06/19/2021 7  5 - 15 Final   MRSA by PCR Next Gen 06/18/2021 NOT DETECTED  NOT DETECTED Final   Sodium 06/20/2021 136  135 - 145 mmol/L Final   Potassium  06/20/2021 4.9  3.5 - 5.1 mmol/L Final   Chloride 06/20/2021 111  98 - 111 mmol/L Final   CO2 06/20/2021 18 (A) 22 - 32 mmol/L Final   Glucose, Bld 06/20/2021 101 (A) 70 - 99 mg/dL Final   BUN 06/20/2021 41 (A) 8 - 23 mg/dL Final   Creatinine, Ser 06/20/2021 5.62 (A) 0.61 - 1.24 mg/dL Final   Calcium 06/20/2021 8.5 (A) 8.9 - 10.3 mg/dL Final   Phosphorus 06/20/2021 5.5 (A) 2.5 - 4.6 mg/dL Final   Albumin 06/20/2021 2.6 (A) 3.5 - 5.0 g/dL Final   GFR, Estimated 06/20/2021 10 (A) >60 mL/min Final   Anion gap 06/20/2021 7  5 - 15 Final     Recent Results (from the past 240 hour(s))  Resp Panel by RT-PCR (Flu A&B, Covid) Nasopharyngeal Swab     Status: None   Collection Time: 06/18/21  1:25 AM   Specimen: Nasopharyngeal Swab; Nasopharyngeal(NP) swabs in vial transport medium  Result Value Ref Range Status   SARS Coronavirus 2 by RT PCR NEGATIVE NEGATIVE Final    Comment: (NOTE) SARS-CoV-2 target nucleic acids are NOT DETECTED.  The SARS-CoV-2 RNA is generally detectable in upper respiratory specimens during the acute phase of infection. The lowest concentration of SARS-CoV-2 viral copies this assay can detect is 138 copies/mL. A negative result does not preclude SARS-Cov-2 infection and should not be used as the sole basis for treatment or other patient management decisions. A negative result may occur with  improper specimen collection/handling, submission of specimen other than nasopharyngeal swab, presence of viral mutation(s)  within the areas targeted by this assay, and inadequate number of viral copies(<138 copies/mL). A negative result must be combined with clinical observations, patient history, and epidemiological information. The expected result is Negative.  Fact Sheet for Patients:  EntrepreneurPulse.com.au  Fact Sheet for Healthcare Providers:  IncredibleEmployment.be  This test is no t yet approved or cleared by the [place] FDA and  has been authorized for detection and/or diagnosis of SARS-CoV-2 by FDA under an Emergency Use Authorization (EUA). This EUA will remain  in effect (meaning this test can be used) for the duration of the COVID-19 declaration under Section 564(b)(1) of the Act, 21 U.S.C.section 360bbb-3(b)(1), unless the authorization is terminated  or revoked sooner.       Influenza A by PCR NEGATIVE NEGATIVE Final   Influenza B by PCR NEGATIVE NEGATIVE Final    Comment: (NOTE) The Xpert Xpress SARS-CoV-2/FLU/RSV plus assay is intended as an aid in the diagnosis of influenza from Nasopharyngeal swab specimens and should not be used as a sole basis for treatment. Nasal washings and aspirates are unacceptable for Xpert Xpress SARS-CoV-2/FLU/RSV testing.  Fact Sheet for Patients: EntrepreneurPulse.com.au  Fact Sheet for Healthcare Providers: IncredibleEmployment.be  This test is not yet approved or cleared by the [place] FDA and has been authorized for detection and/or diagnosis of SARS-CoV-2 by FDA under an Emergency Use Authorization (EUA). This EUA will remain in effect (meaning this test can be used) for the duration of the COVID-19 declaration under Section 564(b)(1) of the Act, 21 U.S.C. section 360bbb-3(b)(1), unless the authorization is terminated or revoked.  Performed at [facility], [place] [address] [place], [place] [address]   MRSA Next Gen by PCR, Nasal     Status: None    Collection Time: 06/18/21  7:01 PM   Specimen: Nasal Mucosa; Nasal Swab  Result Value Ref Range Status   MRSA by PCR Next Gen NOT DETECTED  NOT DETECTED Final    Comment: (NOTE) The GeneXpert MRSA Assay (FDA approved for NASAL specimens only), is one component of a comprehensive MRSA colonization surveillance program. It is not intended to diagnose MRSA infection nor to guide or monitor treatment for MRSA infections. Test performance is not FDA approved in patients less than 44 years old. Performed at [facility], [place] [address] [place], [place] [address]      Imaging Studies  No results found. Medications   Scheduled Meds:  atorvastatin  40 mg Oral QHS   finasteride  5 mg Oral QHS   sodium bicarbonate  1,300 mg Oral BID   tamsulosin  0.4 mg Oral QPC supper     LOS: 3 days   Time spent: >87min  Lacrecia Delval L Joelys Staubs, DO [facility] 06/20/2021, 7:13 AM   To contact the [facility] Attending or Consulting provider for this patient: Check the care team in [facility] for a) attending/consulting [place] provider listed and b) the [facility] team listed Log into [email or web address] and use [place]'s universal password to access. If you do not have the password, please contact the hospital operator. Locate the [facility] provider you are looking for under [facility] and page to a number that you can be directly reached. If you still have difficulty reaching the provider, please page the [facility] (Director on Call) for the Hospitalists listed on amion for assistance.

## 2021-06-20 NOTE — Progress Notes (Signed)
Nephrology Follow-Up Consult note   Assessment/Recommendations: Scott Woods is a/an 70 y.o. male with a past medical history significant for HTN, CAD, DM2, PVD, BPH, CKDa who present w/ fall and AKI on CKD 3a    Non-Oliguric AKI on CKD 3a: Severe AKI in May thought to be dehydration and/or obstruction now again with severe AKI and cause is not completely clear. Unsure if Crt ever returned to baseline after AKI in May.  Previous baseline was 1.5.  No signs of obstruction this admission.  This could just be ATN from intermittent hypotension at home but given confusing picture we did perform serological work-up.  Most serologies pending but hepatitis and HIV were negative.  Interestingly his UPC is about 1.2 g and U ACR is only 65 mg.  This is a very large discrepancy which could argue for disease process such as amyloid or multiple myeloma causing light chain nephropathy.  Other possibility is that this is purely tubular proteinuria. -Follow-up GN labs as they return; specifically SPEP and SFLC -will see if we can add on a 24 UPEP to his urine collection a couple days ago -Fortunately no indication for dialysis at this time but will have to monitor closely -Continue with IV hydration for now -Continue to monitor daily Cr, Dose meds for GFR -Monitor Daily I/Os, Daily weight  -Maintain MAP>65 for optimal renal perfusion.  -Avoid nephrotoxic medications including NSAIDs and Vanc/Zosyn combo -Hold lisinopril   Hyperkalemia: Now resolved  Metabolic acidosis: Bicarb 18.  Increase sodium bicarb to 1300 mg a day   Tachycardia: Patient is tachycardic and normotensive/intermittently hypertensive.  D-dimer age-appropriate.  Unlikely a PE.  Some tachycardia over the last 24 hours is likely related to agitation   Hypertension: Hold lisinopril. Has hydral PRN   HLD: On statin   BPH: Continue home meds as tolerated  Delirium: Sundowning last night.  Encourage delirium precautions throughout the  day.   Recommendations conveyed to primary service.    [facility] 06/20/2021 8:10 AM  ___________________________________________________________  CC: AKI  Interval History/Subjective: Patient experiencing sundowning last night with a fair amount of confusion.  Ultimately was placed in restraints.  He is more alert today and states that he is angry he got tied up last night.  Also lost his phone.  States he feels okay without any issues urinating.  Wants to go home.   Medications:  Current Facility-Administered Medications  Medication Dose Route Frequency Provider Last Rate Last Admin   0.9 %  sodium chloride infusion   Intravenous Continuous Richarda Osmond, MD 100 mL/hr at 06/20/21 0107 New Bag at 06/20/21 0107   acetaminophen (TYLENOL) tablet 650 mg  650 mg Oral Q6H PRN Howerter, Justin B, DO       Or   acetaminophen (TYLENOL) suppository 650 mg  650 mg Rectal Q6H PRN Howerter, Justin B, DO       atorvastatin (LIPITOR) tablet 40 mg  40 mg Oral QHS Howerter, Justin B, DO   40 mg at 06/19/21 2215   finasteride (PROSCAR) tablet 5 mg  5 mg Oral QHS Howerter, Justin B, DO   5 mg at 06/19/21 2215   sodium bicarbonate tablet 1,300 mg  1,300 mg Oral BID Reesa Chew, MD       tamsulosin [facility]) capsule 0.4 mg  0.4 mg Oral QPC supper Howerter, Justin B, DO   0.4 mg at 06/19/21 1719      Review of Systems: 10 systems reviewed and  negative except per interval history/subjective  Physical Exam: Vitals:   06/20/21 0359 06/20/21 0720  BP: (!) 154/77 (!) 159/80  Pulse: 100 95  Resp: 15 17  Temp: 98.3 F (36.8 C) 98 F (36.7 C)  SpO2: 97%    No intake/output data recorded.  Intake/Output Summary (Last 24 hours) at 06/20/2021 0810 Last data filed at 06/19/2021 1200 Gross per 24 hour  Intake 240 ml  Output 700 ml  Net -460 ml   Constitutional: well-appearing, no acute distress ENMT: ears and nose without scars or lesions, MMM CV:  normal rate, no edema Respiratory: Bilateral chest rise, normal work of breathing Gastrointestinal: soft, non-tender, no palpable masses or hernias Skin: no visible lesions or rashes Psych: alert, seems oriented to person and place but not necessarily situation   Test Results I personally reviewed new and old clinical labs and radiology tests Lab Results  Component Value Date   NA 136 06/20/2021   K 4.9 06/20/2021   CL 111 06/20/2021   CO2 18 (L) 06/20/2021   BUN 41 (H) 06/20/2021   CREATININE 5.62 (H) 06/20/2021   CALCIUM 8.5 (L) 06/20/2021   ALBUMIN 2.6 (L) 06/20/2021   PHOS 5.5 (H) 06/20/2021

## 2021-06-21 DIAGNOSIS — E875 Hyperkalemia: Secondary | ICD-10-CM | POA: Diagnosis not present

## 2021-06-21 DIAGNOSIS — E785 Hyperlipidemia, unspecified: Secondary | ICD-10-CM | POA: Diagnosis not present

## 2021-06-21 DIAGNOSIS — N183 Chronic kidney disease, stage 3 unspecified: Secondary | ICD-10-CM | POA: Diagnosis not present

## 2021-06-21 DIAGNOSIS — N179 Acute kidney failure, unspecified: Secondary | ICD-10-CM | POA: Diagnosis not present

## 2021-06-21 LAB — RENAL FUNCTION PANEL
Albumin: 2.4 g/dL — ABNORMAL LOW (ref 3.5–5.0)
Anion gap: 8 (ref 5–15)
BUN: 38 mg/dL — ABNORMAL HIGH (ref 8–23)
CO2: 17 mmol/L — ABNORMAL LOW (ref 22–32)
Calcium: 8.4 mg/dL — ABNORMAL LOW (ref 8.9–10.3)
Chloride: 112 mmol/L — ABNORMAL HIGH (ref 98–111)
Creatinine, Ser: 5.17 mg/dL — ABNORMAL HIGH (ref 0.61–1.24)
GFR, Estimated: 11 mL/min — ABNORMAL LOW (ref >60)
Glucose, Bld: 112 mg/dL — ABNORMAL HIGH (ref 70–99)
Phosphorus: 4.9 mg/dL — ABNORMAL HIGH (ref 2.5–4.6)
Potassium: 4.6 mmol/L (ref 3.5–5.1)
Sodium: 137 mmol/L (ref 135–145)

## 2021-06-21 MED ORDER — QUETIAPINE FUMARATE 25 MG PO TABS
12.5000 mg | ORAL_TABLET | Freq: Every day | ORAL | Status: DC
  Administered 2021-06-21: 12.5 mg via ORAL
  Filled 2021-06-21: qty 1

## 2021-06-21 MED ORDER — LORAZEPAM 2 MG/ML IJ SOLN
0.2500 mg | Freq: Once | INTRAMUSCULAR | Status: DC
  Filled 2021-06-21: qty 1

## 2021-06-21 MED ORDER — CARVEDILOL 6.25 MG PO TABS
6.2500 mg | ORAL_TABLET | Freq: Two times a day (BID) | ORAL | Status: DC
  Administered 2021-06-21: 6.25 mg via ORAL
  Filled 2021-06-21: qty 1

## 2021-06-21 MED ORDER — LORAZEPAM 2 MG/ML IJ SOLN
0.2500 mg | Freq: Once | INTRAMUSCULAR | Status: AC
  Administered 2021-06-21: 0.25 mg via INTRAVENOUS
  Filled 2021-06-21: qty 1

## 2021-06-21 NOTE — Progress Notes (Signed)
Nephrology Follow-Up Consult note   Assessment/Recommendations: Scott Woods is a/an 70 y.o. male with a past medical history significant for HTN, CAD, DM2, PVD, BPH, CKDa who present w/ fall and AKI on CKD 3a    Non-Oliguric AKI on CKD 3a: Severe AKI in May thought to be dehydration and/or obstruction now again with severe AKI and cause is not completely clear. Unsure if Crt ever returned to baseline after AKI in May.  Previous baseline was 1.5.  No signs of obstruction this admission on CT 9/29.  This could just be ATN from intermittent hypotension at home but given confusing picture we did perform serological work-up.  Most serologies pending but hepatitis and HIV were negative.  Interestingly his UPC is about 1.2 g and U ACR is only 65 mg.  This is a very large discrepancy which could argue for disease process such as amyloid or multiple myeloma causing light chain nephropathy.  Other possibility is that this is purely tubular proteinuria. -Follow-up GN labs as they return; specifically SPEP and SFLC. 24h UPEP ordered - couldn't be added on -Fortunately no indication for dialysis at this time but will have to monitor closely; with his BUN in the 30s doubt significant uremia as etiology of confusion -Continue with IV hydration for now but decrease to 75/hr. -Continue to monitor daily Cr, Dose meds for GFR -Monitor Daily I/Os, Daily weight  -Maintain MAP>65 for optimal renal perfusion.  -Avoid nephrotoxic medications including NSAIDs and Vanc/Zosyn combo -Hold lisinopril  Anemia: Worsening normocytic anemia with Hb now 7.7, was 10.9 in 01/2021.  Check iron, B12, folate. Myeloma w/u per above.    Hyperkalemia: Now resolved  Metabolic acidosis: Bicarb 18.  Increase sodium bicarb to 1300 mg a day   Hypertension: Hold lisinopril. Has hydral PRN   HLD: On statin   BPH: Continue home meds as tolerated  Delirium: ongoing supportive care measures per primary.     Scott Woods [facility] 06/21/2021 10:06 AM  ___________________________________________________________  CC: AKI  Interval History/Subjective: Pt seen in bedside chair States he feels okay without any issues urinating.  Wants to go home.  Confused to situation/details.    Medications:  Current Facility-Administered Medications  Medication Dose Route Frequency Provider Last Rate Last Admin   0.9 %  sodium chloride infusion   Intravenous Continuous Scott Bock, MD 100 mL/hr at 06/20/21 2053 New Bag at 06/20/21 2053   acetaminophen (TYLENOL) tablet 650 mg  650 mg Oral Q6H PRN Woods, Scott B, DO       Or   acetaminophen (TYLENOL) suppository 650 mg  650 mg Rectal Q6H PRN Woods, Scott B, DO       atorvastatin (LIPITOR) tablet 40 mg  40 mg Oral QHS Woods, Scott B, DO   40 mg at 06/20/21 2116   carvedilol (COREG) tablet 3.125 mg  3.125 mg Oral BID WC Scott Rushing L, MD   3.125 mg at 06/21/21 0926   finasteride (PROSCAR) tablet 5 mg  5 mg Oral QHS Woods, Scott B, DO   5 mg at 06/20/21 2116   LORazepam (ATIVAN) injection 0.25 mg  0.25 mg Intravenous Once Scott Clos, MD       sodium bicarbonate tablet 1,300 mg  1,300 mg Oral BID Scott Level, MD   1,300 mg at 06/21/21 0926   tamsulosin (FLOMAX) capsule 0.4 mg  0.4 mg Oral QPC supper Woods, Scott B, DO   0.4 mg at 06/20/21 1705  Review of Systems: 10 systems reviewed and negative except per interval history/subjective  Physical Exam: Vitals:   06/21/21 0302 06/21/21 0725  BP: (!) 160/88 (!) 153/93  Pulse: 86   Resp: 16 18  Temp: (!) 97.5 F (36.4 C) 97.9 F (36.6 C)  SpO2: 97%    No intake/output data recorded.  Intake/Output Summary (Last 24 hours) at 06/21/2021 1006 Last data filed at 06/21/2021 8682 Gross per 24 hour  Intake 5220.84 ml  Output 2450 ml  Net 2770.84 ml    Constitutional: well-appearing, no acute distress ENMT: ears and nose without scars or lesions, MMM CV: normal  rate, no edema Respiratory: Bilateral chest rise, normal work of breathing Gastrointestinal: soft, non-tender, no palpable masses or hernias Skin: no visible lesions or rashes Psych: alert, seems oriented to person and place but not necessarily situation GU: condom catheter with clear yellow urine Neuro: no clear asterixis   Test Results I personally reviewed new and old clinical labs and radiology tests Lab Results  Component Value Date   NA 137 06/21/2021   K 4.6 06/21/2021   CL 112 (H) 06/21/2021   CO2 17 (Woods) 06/21/2021   BUN 38 (H) 06/21/2021   CREATININE 5.17 (H) 06/21/2021   CALCIUM 8.4 (Woods) 06/21/2021   ALBUMIN 2.4 (Woods) 06/21/2021   PHOS 4.9 (H) 06/21/2021

## 2021-06-21 NOTE — Progress Notes (Signed)
Physical Therapy Treatment Patient Details Name: Scott Woods MRN: [id number] DOB: 05-29-1951 Today's Date: 06/21/2021   History of Present Illness Pt is a 70 y.o. male who presented 06/10/2021 with syncopal episode/fall and AKI on CKD 3a. PMH: HTN, CAD, DM2, PVD, BPH, CKDa    PT Comments    The pt continues to present with marked deficits in safety awareness, orientation, and problem solving this session, but was able to progress ambulation distance with use of RW. The pt continues to state he does not really need a RW, but demos multiple episodes of scissoring steps and lateral drifting when using RW that would result in LOB without UE support. When pt ambulating without UE support, he demos marked increase in lateral sway and requires minA to steady and avoid fall. Pt remains at high risk for falls and therefore I would recommend SNF for rehab prior to return home to improve pt safety with mobility, but pt continues to decline need. Will benefit from max HHPT and max supervision that can be provided.    Recommendations for follow up therapy are one component of a multi-disciplinary discharge planning process, led by the attending physician.  Recommendations may be updated based on patient status, additional functional criteria and insurance authorization.  Follow Up Recommendations  Home health PT;Supervision for mobility/OOB (recommend SNF but pt continues to decline)     Equipment Recommendations   (tub bench)    Recommendations for Other Services OT consult     Precautions / Restrictions Precautions Precautions: Fall Precaution Comments: pt reporting repeated falls at eval Restrictions Weight Bearing Restrictions: No     Mobility  Bed Mobility Overal bed mobility: Needs Assistance             General bed mobility comments: pt OOB in recliner at start and end of session    Transfers Overall transfer level: Needs assistance Equipment used: Rolling walker (2  wheeled) Transfers: Sit to/from Stand Sit to Stand: Min guard         General transfer comment: pt rising to stand without assist, no overt LOB. pt bracing BLE on chair to compensate for balance, poor eccentric lower and minimal use of hands to perform safely  Ambulation/Gait Ambulation/Gait assistance: Min guard Gait Distance (Feet): 150 Feet Assistive device: Rolling walker (2 wheeled) Gait Pattern/deviations: Step-through pattern;Decreased stride length;Scissoring;Drifts right/left Gait velocity: 0.42 m/s Gait velocity interpretation: <1.31 ft/sec, indicative of household ambulator General Gait Details: pt with slow, narrow BOS with frequent scissoring steps. no overt LOB at this time, but pt very turned around in hallway, unable to wayfind back to room without cues. minA to manage when pt leaving RW to side once returning to room. very unsteady without UE support   Modified Rankin (Stroke Patients Only) Modified Rankin (Stroke Patients Only) Pre-Morbid Rankin Score: Slight disability Modified Rankin: Moderately severe disability     Balance Overall balance assessment: Needs assistance Sitting-balance support: No upper extremity supported;Feet supported Sitting balance-Leahy Scale: Good Sitting balance - Comments: no LOB donning socks   Standing balance support: No upper extremity supported;During functional activity Standing balance-Leahy Scale: Fair Standing balance comment: able to stand statically without UE support, increased sway, benefits from BUE support                            Cognition Arousal/Alertness: Awake/alert Behavior During Therapy: WFL for tasks assessed/performed Overall Cognitive Status: Impaired/Different from baseline Area of Impairment: Orientation;Memory;Following commands;Safety/judgement;Problem solving  Orientation Level: Disoriented to;Time;Situation   Memory: Decreased recall of precautions;Decreased  short-term memory Following Commands: Follows one step commands with increased time;Follows one step commands consistently Safety/Judgement: Decreased awareness of safety;Decreased awareness of deficits   Problem Solving: Slow processing;Difficulty sequencing;Requires verbal cues General Comments: pt agreeable to corrections, makes no initiation to correct safety issues without cues, needing prompts to safely manage in room and for monitoring of exertion. pt unable to complete wayfinding in the hall without direct cues from therapist         General Comments General comments (skin integrity, edema, etc.): pt asymptomatic, tele monitor not reading during session      Pertinent Vitals/Pain Pain Assessment: Faces Faces Pain Scale: No hurt Pain Intervention(s): Monitored during session     PT Goals (current goals can now be found in the care plan section) Acute Rehab PT Goals Patient Stated Goal: to go home today PT Goal Formulation: With patient Time For Goal Achievement: 07/03/21 Potential to Achieve Goals: Good Progress towards PT goals: Progressing toward goals    Frequency    Min 3X/week      PT Plan Current plan remains appropriate       AM-PAC PT "6 Clicks" Mobility   Outcome Measure  Help needed turning from your back to your side while in a flat bed without using bedrails?: A Little Help needed moving from lying on your back to sitting on the side of a flat bed without using bedrails?: A Little Help needed moving to and from a bed to a chair (including a wheelchair)?: A Little Help needed standing up from a chair using your arms (e.g., wheelchair or bedside chair)?: A Little Help needed to walk in hospital room?: A Little Help needed climbing 3-5 steps with a railing? : A Lot 6 Click Score: 17    End of Session Equipment Utilized During Treatment: Gait belt Activity Tolerance: Patient tolerated treatment well Patient left: with call bell/phone within reach;in  chair;with chair alarm set Nurse Communication: Mobility status PT Visit Diagnosis: Unsteadiness on feet (R26.81);Other abnormalities of gait and mobility (R26.89);Muscle weakness (generalized) (M62.81);History of falling (Z91.81);Difficulty in walking, not elsewhere classified (R26.2)     Time: 8016-5537 PT Time Calculation (min) (ACUTE ONLY): 19 min  Charges:  $Gait Training: 8-22 mins                     West Carbo, PT, DPT   Acute Rehabilitation Department Pager #: [phone]   Sandra Cockayne 06/21/2021, 2:37 PM

## 2021-06-21 NOTE — Evaluation (Signed)
Occupational Therapy Evaluation Patient Details Name: Scott Woods MRN: [id number] DOB: 1950/10/21 Today's Date: 06/21/2021   History of Present Illness Pt is a 70 y.o. male who presented 06/07/2021 with syncopal episode/fall and AKI on CKD 3a. PMH: HTN, CAD, DM2, PVD, BPH, CKDa   Clinical Impression   Pt was living independently with intermittent assist of his neighbor. 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 Pt presents with generalized weakness and impaired standing balance. Pt scored 12/28 on the Short Blessed Test, indicative of "Impairment Consistent with Dementia." Pt specifically demonstrating memory impairment on the screening. He displays poor insight into his deficits. No family available to determine pt's baseline. Per staff, pt was agitated early this morning. Recommending SNF, but pt likely to refuse.      Recommendations for follow up therapy are one component of a multi-disciplinary discharge planning process, led by the attending physician.  Recommendations may be updated based on patient status, additional functional criteria and insurance authorization.   Follow Up Recommendations  SNF;Supervision/Assistance - 24 hour (HHOT if pt refuses--highly likely)    Equipment Recommendations  Other (comment) (defer to [place] vs SNF)    Recommendations for Other Services       Precautions / Restrictions Precautions Precautions: Fall Precaution Comments: pt denies falling at home      Mobility Bed Mobility               General bed mobility comments: in chair    Transfers Overall transfer level: Needs assistance Equipment used: Rolling walker (2 wheeled) Transfers: Sit to/from Stand Sit to Stand: Min guard              Balance Overall balance assessment: Needs assistance Sitting-balance support: No upper extremity supported;Feet supported Sitting balance-Leahy Scale: Good Sitting balance - Comments: no LOB donning socks    Standing balance support: No upper extremity supported;During functional activity Standing balance-Leahy Scale: Fair Standing balance comment: Able to stand statically without UE support but benefits from bil UE support for mobility.                           ADL either performed or assessed with clinical judgement   ADL Overall ADL's : Needs assistance/impaired Eating/Feeding: Independent;Sitting   Grooming: Wash/dry hands;Standing;Min guard   Upper Body Bathing: Set up;Sitting   Lower Body Bathing: Min guard;Sit to/from stand   Upper Body Dressing : Set up;Sitting   Lower Body Dressing: Min guard;Sit to/from stand   Toilet Transfer: Min guard;Ambulation;RW   Toileting- Water quality scientist and Hygiene: Min guard;Sit to/from stand       Functional mobility during ADLs: Min guard;Rolling walker (pt denies falls and need for RW)       Vision Baseline Vision/History: 1 Wears glasses Patient Visual Report: No change from baseline Additional Comments: wears reading glasses     Perception     Praxis      Pertinent Vitals/Pain Pain Assessment: Faces Faces Pain Scale: No hurt     Hand Dominance Right   Extremity/Trunk Assessment Upper Extremity Assessment Upper Extremity Assessment: Overall WFL for tasks assessed   Lower Extremity Assessment Lower Extremity Assessment: Defer to PT evaluation   Cervical / Trunk Assessment Cervical / Trunk Assessment: Kyphotic   Communication Communication Communication: No difficulties   Cognition Arousal/Alertness: Awake/alert Behavior During Therapy: WFL for tasks assessed/performed Overall Cognitive Status: No family/caregiver present to determine baseline cognitive functioning  General Comments: Assessed pt with Short Blessed Test, scored 12/28 (normal score=6) consistent with dementia. Pt combative with staff this morning.   General Comments       Exercises      Shoulder Instructions      Home Living Family/patient expects to be discharged to:: Private residence Living Arrangements: Alone Available Help at Discharge: Neighbor;Available PRN/intermittently;Family Type of Home: Apartment Home Access: Stairs to enter [employer] of Steps: 3-4 Entrance Stairs-Rails: Right Home Layout: One level     Bathroom Shower/Tub: Teacher, early years/pre: Standard     Home Equipment: Environmental consultant - 2 wheels;Cane - single point;Grab bars - tub/shower          Prior Functioning/Environment Level of Independence: Independent with assistive device(s)        Comments: Pt was independent without AD before admission in June 2022, in which he then began to use a [facility] for mod I mobility. Pt drives. 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 He used to be in the [employer].        OT Problem List: Impaired balance (sitting and/or standing);Decreased cognition;Decreased safety awareness;Decreased knowledge of use of DME or AE      OT Treatment/Interventions: Self-care/ADL training;DME and/or AE instruction;Therapeutic activities;Cognitive remediation/compensation;Patient/family education;Balance training    OT Goals(Current goals can be found in the care plan section) Acute Rehab OT Goals Patient Stated Goal: to go home today OT Goal Formulation: With patient Time For Goal Achievement: 07/05/21 Potential to Achieve Goals: Good ADL Goals Pt Will Perform Grooming: with supervision;standing Pt Will Perform Lower Body Bathing: with supervision;sit to/from stand Pt Will Perform Lower Body Dressing: with supervision;sit to/from stand Pt Will Transfer to Toilet: with supervision;ambulating;regular height toilet Pt Will Perform Toileting - Clothing Manipulation and hygiene: with supervision;sit to/from stand Pt Will Perform Tub/Shower Transfer: with supervision;ambulating;rolling  walker Additional ADL Goal #1: Pt will identify and gather items necessary for ADL around his room with supervision and AD. Additional ADL Goal #2: Pt will complete pill box test without errors.  OT Frequency: Min 2X/week   Barriers to D/C: Decreased caregiver support          Co-evaluation              AM-PAC OT "6 Clicks" Daily Activity     Outcome Measure Help from another person eating meals?: None Help from another person taking care of personal grooming?: A Little Help from another person toileting, which includes using toliet, bedpan, or urinal?: A Little Help from another person bathing (including washing, rinsing, drying)?: A Little Help from another person to put on and taking off regular upper body clothing?: None Help from another person to put on and taking off regular lower body clothing?: A Little 6 Click Score: 20   End of Session Equipment Utilized During Treatment: Rolling walker  Activity Tolerance: Patient tolerated treatment well Patient left: in chair;with call bell/phone within reach;with chair alarm set  OT Visit Diagnosis: Unsteadiness on feet (R26.81);Other abnormalities of gait and mobility (R26.89);Other symptoms and signs involving cognitive function;Muscle weakness (generalized) (M62.81)                Time: 2725-3664 OT Time Calculation (min): 27 min Charges:  OT General Charges $OT Visit: 1 Visit OT Evaluation $OT Eval Moderate Complexity: 1 Mod OT Treatments $Self Care/Home Management : 8-22 mins  Nestor Lewandowsky, OTR/L Acute Rehabilitation Services Pager: [phone] Office: [phone]  [name] 06/21/2021, 9:19 AM

## 2021-06-21 NOTE — Progress Notes (Signed)
PROGRESS NOTE  ANANIAS KOLANDER    DOB: 01-06-1951, 70 y.o.  VHQ:[id number]  PCP: [name], PA-C   Code Status: Full Code   DOA: 05/28/2021   LOS: 4  Brief Narrative of Current Hospitalization  BENEDICT KUE is a 71 y.o. male with a PMH significant for CKD 4, HTN, HLD, BPH, chronic anemia. They presented from volleyball game to the ED on 06/11/2021 with syncope x1. In the ED, it was found that they had hyperkalemia. They were treated with albuterol, insulin, lokelma, LR.  Patient was admitted to medicine service for further workup and management of ARF and syncope as outlined in detail below.  06/21/21 -stable, no acute events reported overnight  Assessment & Plan  Principal Problem:   Acute renal failure superimposed on stage 4 chronic kidney disease (HCC) Active Problems:   Hyperlipidemia LDL goal <100   Hypertension, goal below 140/90   Hyperkalemia   Syncope   Anemia due to chronic kidney disease   Fall  Delirium-no recorded events from overnight. Likely exacerbated by being attached to several medical monitors and IV and not having his eyeglasses or view of the sun at window. Confused today on instructions given by nephrology.  -Contact sister to get his glasses -Sitting up in sunlight and/or walking around the unit during the day - seroquel QHS trial  Syncope- D-dimer age adjusted to make VTE very unlikely without other respiratory symptoms.  No further episodes during admission.  Telemetry showing only sinus tachycardia without other abnormal arrhythmias. Echo report without abnormalities - continue telemetry - IV fluids - PT/OT with orthostatic -Trial of low-dose beta-blocker today in setting of sinus tachycardia essential tremor-increase carvedilol 6.25 twice daily  Hyperkalemia- resolved. 6.3>6.0>5.7>5.1>4.9 s/p Lokelma, albuterol, insulin x2.  In setting of AKI on CKD 4 with creatinine baseline of around 2.7.  Creatinine improving 6.2>6.1>5.8>5.6>5.17.  - continue  normal saline -nephrology following - avoid nephrotoxic agents - w/u for GN continuing - daily Cr - increase bicarb - IV fluids decreased - myeloma w/u -Strict I/O - RFP am  HTN- intermittently elevated. Increasing BB - holding home lisinopril for AKI  BPH - continue home finasteride - continue home flomax  HLD - continue home atorvastatin  Tremor- patient has hand tremor which he attributes to being cold but is consistent with essential tremor. Negative asterixis.  - given the tremor and chronic tachycardia, would like to trial low dose BB this admission but will hold off for further telemetry monitoring for occult block or arrhythmia  DVT prophylaxis: SCDs Start: 06/14/2021 2337  Diet:  Diet Orders (From admission, onward)     Start     Ordered   05/26/2021 2339  Diet regular Room service appropriate? Yes; Fluid consistency: Thin  Diet effective now       Question Answer Comment  Room service appropriate? Yes   Fluid consistency: Thin      05/20/2021 2338            Subjective 06/21/21    Pt has no complaints today. He states that he was told he could go home and is going to get dressed and call his sister to pick him up. I confirmed that nephrology did not tell him he could go home today.  Disposition Plan & Communication  Status is: Inpatient  Remains inpatient appropriate because:Persistent severe electrolyte disturbances and Ongoing diagnostic testing needed not appropriate for outpatient work up  Dispo: The patient is from: Home  Anticipated d/c is to: Home              Patient currently is not medically stable to d/c.   Difficult to place patient No  Family Communication: none   Consults, Procedures, Significant Events  Consultants:  nephrology  Procedures/significant events:  none  Antimicrobials:  Anti-infectives (From admission, onward)    None        Objective   Vitals:   06/20/21 1538 06/20/21 1935 06/21/21 0002 06/21/21  0302  BP: 124/72 (!) 144/78 (!) 141/72 (!) 160/88  Pulse: 75 85 88 86  Resp: 16 18 (!) 24 16  Temp: 97.7 F (36.5 C) 98 F (36.7 C) 98.7 F (37.1 C) (!) 97.5 F (36.4 C)  TempSrc: Oral Oral Oral Oral  SpO2: 98% 97% 97% 97%  Weight:    71.4 kg  Height:        Intake/Output Summary (Last 24 hours) at 06/21/2021 0715 Last data filed at 06/21/2021 3846 Gross per 24 hour  Intake 5460.84 ml  Output 3050 ml  Net 2410.84 ml    Filed Weights   05/30/2021 2051 06/19/21 0425 06/21/21 0302  Weight: 68 kg 69 kg 71.4 kg    Patient BMI: Body mass index is 22.59 kg/m.   Physical Exam: General: awake, alert, NAD HEENT: atraumatic, clear conjunctiva, anicteric sclera, dry mucus membranes, hearing grossly normal Respiratory: CTAB, no wheezes, rales or rhonchi, normal respiratory effort. Cardiovascular: normal S1/S2, tachycardic but regular, no JVD, murmurs, rubs, gallops, quick capillary refill  Gastrointestinal: soft, NT, ND, no HSM felt Nervous: A&O x3. no gross focal neurologic deficits, normal speech Extremities: moves all equally, no edema, normal tone. Essential tremor bilateral arms Skin: dry, intact, normal temperature, normal color, No rashes, lesions or ulcers. Yellowish tint to skin Psychiatry: normal mood, congruent affect  Labs   I have personally reviewed following labs and imaging studies Admission on 06/12/2021  Component Date Value Ref Range Status   Sodium 06/07/2021 137  135 - 145 mmol/L Final   Potassium 06/07/2021 6.3 (A) 3.5 - 5.1 mmol/L Final   Chloride 06/10/2021 106  98 - 111 mmol/L Final   CO2 06/04/2021 22  22 - 32 mmol/L Final   Glucose, Bld 06/11/2021 124 (A) 70 - 99 mg/dL Final   BUN 05/28/2021 44 (A) 8 - 23 mg/dL Final   Creatinine, Ser 05/24/2021 6.23 (A) 0.61 - 1.24 mg/dL Final   Calcium 06/09/2021 9.6  8.9 - 10.3 mg/dL Final   Total Protein 05/27/2021 6.5  6.5 - 8.1 g/dL Final   Albumin 06/05/2021 3.1 (A) 3.5 - 5.0 g/dL Final   AST 06/08/2021 16  15 -  41 U/L Final   ALT 06/06/2021 18  0 - 44 U/L Final   Alkaline Phosphatase 06/15/2021 66  38 - 126 U/L Final   Total Bilirubin 05/31/2021 0.5  0.3 - 1.2 mg/dL Final   GFR, Estimated 05/28/2021 9 (A) >60 mL/min Final   Anion gap 06/08/2021 9  5 - 15 Final   Troponin I (High Sensitivity) 06/16/2021 5  <18 ng/L Final   WBC 06/06/2021 8.8  4.0 - 10.5 K/uL Final   RBC 05/28/2021 2.56 (A) 4.22 - 5.81 MIL/uL Final   Hemoglobin 05/30/2021 8.2 (A) 13.0 - 17.0 g/dL Final   HCT 05/29/2021 25.6 (A) 39.0 - 52.0 % Final   MCV 05/20/2021 100.0  80.0 - 100.0 fL Final   MCH 06/16/2021 32.0  26.0 - 34.0 pg Final   MCHC 06/03/2021 32.0  30.0 -  36.0 g/dL Final   RDW 05/22/2021 12.8  11.5 - 15.5 % Final   Platelets 05/30/2021 192  150 - 400 K/uL Final   nRBC 06/14/2021 0.0  0.0 - 0.2 % Final   Neutrophils Relative % 05/24/2021 80  % Final   Neutro Abs 05/20/2021 7.0  1.7 - 7.7 K/uL Final   Lymphocytes Relative 05/22/2021 13  % Final   Lymphs Abs 05/27/2021 1.2  0.7 - 4.0 K/uL Final   Monocytes Relative 06/14/2021 5  % Final   Monocytes Absolute 05/26/2021 0.5  0.1 - 1.0 K/uL Final   Eosinophils Relative 05/28/2021 1  % Final   Eosinophils Absolute 06/15/2021 0.1  0.0 - 0.5 K/uL Final   Basophils Relative 06/04/2021 0  % Final   Basophils Absolute 06/18/2021 0.0  0.0 - 0.1 K/uL Final   Immature Granulocytes 06/06/2021 1  % Final   Abs Immature Granulocytes 06/08/2021 0.05  0.00 - 0.07 K/uL Final   Color, Urine 06/18/2021 STRAW (A) YELLOW Final   APPearance 06/18/2021 CLEAR  CLEAR Final   Specific Gravity, Urine 06/18/2021 1.010  1.005 - 1.030 Final   pH 06/18/2021 8.0  5.0 - 8.0 Final   Glucose, UA 06/18/2021 150 (A) NEGATIVE mg/dL Final   Hgb urine dipstick 06/18/2021 NEGATIVE  NEGATIVE Final   Bilirubin Urine 06/18/2021 NEGATIVE  NEGATIVE Final   Ketones, ur 06/18/2021 NEGATIVE  NEGATIVE mg/dL Final   Protein, ur 06/18/2021 30 (A) NEGATIVE mg/dL Final   Nitrite 06/18/2021 NEGATIVE  NEGATIVE Final    Leukocytes,Ua 06/18/2021 NEGATIVE  NEGATIVE Final   WBC, UA 06/18/2021 0-5  0 - 5 WBC/hpf Final   Bacteria, UA 06/18/2021 NONE SEEN  NONE SEEN Final   Lactic Acid, Venous 05/22/2021 0.9  0.5 - 1.9 mmol/L Final   Lactic Acid, Venous 06/18/2021 0.9  0.5 - 1.9 mmol/L Final   Troponin I (High Sensitivity) 06/18/2021 5  <18 ng/L Final   Glucose-Capillary 06/18/2021 109 (A) 70 - 99 mg/dL Final   SARS Coronavirus 2 by RT PCR 06/18/2021 NEGATIVE  NEGATIVE Final   Influenza A by PCR 06/18/2021 NEGATIVE  NEGATIVE Final   Influenza B by PCR 06/18/2021 NEGATIVE  NEGATIVE Final   Phosphorus 06/18/2021 4.3  2.5 - 4.6 mg/dL Final   Magnesium 06/18/2021 2.1  1.7 - 2.4 mg/dL Final   Magnesium 06/18/2021 2.1  1.7 - 2.4 mg/dL Final   Sodium 06/18/2021 136  135 - 145 mmol/L Final   Potassium 06/18/2021 6.0 (A) 3.5 - 5.1 mmol/L Final   Chloride 06/18/2021 107  98 - 111 mmol/L Final   CO2 06/18/2021 20 (A) 22 - 32 mmol/L Final   Glucose, Bld 06/18/2021 115 (A) 70 - 99 mg/dL Final   BUN 06/18/2021 40 (A) 8 - 23 mg/dL Final   Creatinine, Ser 06/18/2021 6.13 (A) 0.61 - 1.24 mg/dL Final   Calcium 06/18/2021 9.3  8.9 - 10.3 mg/dL Final   Total Protein 06/18/2021 6.0 (A) 6.5 - 8.1 g/dL Final   Albumin 06/18/2021 2.9 (A) 3.5 - 5.0 g/dL Final   AST 06/18/2021 15  15 - 41 U/L Final   ALT 06/18/2021 16  0 - 44 U/L Final   Alkaline Phosphatase 06/18/2021 63  38 - 126 U/L Final   Total Bilirubin 06/18/2021 1.0  0.3 - 1.2 mg/dL Final   GFR, Estimated 06/18/2021 9 (A) >60 mL/min Final   Anion gap 06/18/2021 9  5 - 15 Final   WBC 06/18/2021 7.0  4.0 - 10.5 K/uL Final  RBC 06/18/2021 2.37 (A) 4.22 - 5.81 MIL/uL Final   Hemoglobin 06/18/2021 7.7 (A) 13.0 - 17.0 g/dL Final   HCT 06/18/2021 23.2 (A) 39.0 - 52.0 % Final   MCV 06/18/2021 97.9  80.0 - 100.0 fL Final   MCH 06/18/2021 32.5  26.0 - 34.0 pg Final   MCHC 06/18/2021 33.2  30.0 - 36.0 g/dL Final   RDW 06/18/2021 12.7  11.5 - 15.5 % Final   Platelets 06/18/2021  158  150 - 400 K/uL Final   nRBC 06/18/2021 0.3 (A) 0.0 - 0.2 % Final   Total CK 06/18/2021 56  49 - 397 U/L Final   Creatinine, Urine 06/18/2021 54.18  mg/dL Final   Total Protein, Urine 06/18/2021 67  mg/dL Final   Protein Creatinine Ratio 06/18/2021 1.24 (A) 0.00 - 0.15 mg/mg[Cre] Final   Sodium, Ur 06/18/2021 109  mmol/L Final   Creatinine, Urine 06/18/2021 54.61  mg/dL Final   Prothrombin Time 06/18/2021 14.6  11.4 - 15.2 seconds Final   INR 06/18/2021 1.1  0.8 - 1.2 Final   Prolactin 06/18/2021 8.5  4.0 - 15.2 ng/mL Final   Troponin I (High Sensitivity) 06/18/2021 5  <18 ng/L Final   Sodium 06/18/2021 135  135 - 145 mmol/L Final   Potassium 06/18/2021 5.7 (A) 3.5 - 5.1 mmol/L Final   Chloride 06/18/2021 108  98 - 111 mmol/L Final   CO2 06/18/2021 20 (A) 22 - 32 mmol/L Final   Glucose, Bld 06/18/2021 128 (A) 70 - 99 mg/dL Final   BUN 06/18/2021 41 (A) 8 - 23 mg/dL Final   Creatinine, Ser 06/18/2021 5.97 (A) 0.61 - 1.24 mg/dL Final   Calcium 06/18/2021 8.9  8.9 - 10.3 mg/dL Final   GFR, Estimated 06/18/2021 10 (A) >60 mL/min Final   Anion gap 06/18/2021 7  5 - 15 Final   Microalb, Ur 06/18/2021 26.3 (A) Not Estab. ug/mL Final   Microalb Creat Ratio 06/18/2021 65 (A) 0 - 29 mg/g creat Final   Creatinine, Urine 06/18/2021 40.7  Not Estab. mg/dL Final   Creatinine, Urine 06/18/2021 45.87  mg/dL Final   Total Protein, Urine 06/18/2021 56  mg/dL Final   Protein Creatinine Ratio 06/18/2021 1.22 (A) 0.00 - 0.15 mg/mg[Cre] Final   Hepatitis B Surface Ag 06/18/2021 NON REACTIVE  NON REACTIVE Final   HCV Ab 06/18/2021 NON REACTIVE  NON REACTIVE Final   Hep A IgM 06/18/2021 NON REACTIVE  NON REACTIVE Final   Hep B C IgM 06/18/2021 NON REACTIVE  NON REACTIVE Final   HIV Screen 4th Generation wRfx 06/18/2021 Non Reactive  Non Reactive Final   RPR Ser Ql 06/18/2021 NON REACTIVE  NON REACTIVE Final   D-Dimer, Quant 06/18/2021 1.71 (A) 0.00 - 0.50 ug/mL-FEU Final   Sodium 06/19/2021 134 (A)  135 - 145 mmol/L Final   Potassium 06/19/2021 5.1  3.5 - 5.1 mmol/L Final   Chloride 06/19/2021 107  98 - 111 mmol/L Final   CO2 06/19/2021 20 (A) 22 - 32 mmol/L Final   Glucose, Bld 06/19/2021 116 (A) 70 - 99 mg/dL Final   BUN 06/19/2021 40 (A) 8 - 23 mg/dL Final   Creatinine, Ser 06/19/2021 5.83 (A) 0.61 - 1.24 mg/dL Final   Calcium 06/19/2021 8.5 (A) 8.9 - 10.3 mg/dL Final   Phosphorus 06/19/2021 5.0 (A) 2.5 - 4.6 mg/dL Final   Albumin 06/19/2021 2.6 (A) 3.5 - 5.0 g/dL Final   GFR, Estimated 06/19/2021 10 (A) >60 mL/min Final   Anion gap 06/19/2021 7  5 - 15 Final   MRSA by PCR Next Gen 06/18/2021 NOT DETECTED  NOT DETECTED Final   Sodium 06/20/2021 136  135 - 145 mmol/L Final   Potassium 06/20/2021 4.9  3.5 - 5.1 mmol/L Final   Chloride 06/20/2021 111  98 - 111 mmol/L Final   CO2 06/20/2021 18 (A) 22 - 32 mmol/L Final   Glucose, Bld 06/20/2021 101 (A) 70 - 99 mg/dL Final   BUN 06/20/2021 41 (A) 8 - 23 mg/dL Final   Creatinine, Ser 06/20/2021 5.62 (A) 0.61 - 1.24 mg/dL Final   Calcium 06/20/2021 8.5 (A) 8.9 - 10.3 mg/dL Final   Phosphorus 06/20/2021 5.5 (A) 2.5 - 4.6 mg/dL Final   Albumin 06/20/2021 2.6 (A) 3.5 - 5.0 g/dL Final   GFR, Estimated 06/20/2021 10 (A) >60 mL/min Final   Anion gap 06/20/2021 7  5 - 15 Final   Weight 06/20/2021 2,433.88  oz Final   Height 06/20/2021 70  in Final   BP 06/20/2021 145/91  mmHg Final   S' Lateral 06/20/2021 3.10  cm Final   AR max vel 06/20/2021 2.74  cm2 Final   AV Area VTI 06/20/2021 2.82  cm2 Final   AV Mean grad 06/20/2021 2.0  mmHg Final   AV Peak grad 06/20/2021 4.2  mmHg Final   Ao pk vel 06/20/2021 1.02  m/s Final   P 1/2 time 06/20/2021 312  msec Final   Area-P 1/2 06/20/2021 4.01  cm2 Final   AV Area mean vel 06/20/2021 2.82  cm2 Final   Sodium 06/21/2021 137  135 - 145 mmol/L Final   Potassium 06/21/2021 4.6  3.5 - 5.1 mmol/L Final   Chloride 06/21/2021 112 (A) 98 - 111 mmol/L Final   CO2 06/21/2021 17 (A) 22 - 32 mmol/L  Final   Glucose, Bld 06/21/2021 112 (A) 70 - 99 mg/dL Final   BUN 06/21/2021 38 (A) 8 - 23 mg/dL Final   Creatinine, Ser 06/21/2021 5.17 (A) 0.61 - 1.24 mg/dL Final   Calcium 06/21/2021 8.4 (A) 8.9 - 10.3 mg/dL Final   Phosphorus 06/21/2021 4.9 (A) 2.5 - 4.6 mg/dL Final   Albumin 06/21/2021 2.4 (A) 3.5 - 5.0 g/dL Final   GFR, Estimated 06/21/2021 11 (A) >60 mL/min Final   Anion gap 06/21/2021 8  5 - 15 Final    Imaging Studies  ECHOCARDIOGRAM COMPLETE  Result Date: 06/20/2021    ECHOCARDIOGRAM REPORT   Patient Name:   MARWIN PRIMMER Date of Exam: 06/20/2021 Medical Rec #:  [id number]      Height:       70.0 in Accession #:    [id number]     Weight:       152.1 lb Date of Birth:  [birth year]      BSA:          1.858 m Patient Age:    21 years       BP:           145/91 mmHg Patient Gender: M              HR:           77 bpm. Exam Location:  Inpatient Procedure: 2D Echo, Cardiac Doppler and Color Doppler Indications:    Syncope  History:        Patient has no prior history of Echocardiogram examinations.                 Risk Factors:Hypertension and Dyslipidemia. CKD.  Sonographer:  Clayton Lefort RDCS (AE) Referring Phys: [id number] [place]  1. Left ventricular ejection fraction, by estimation, is 60 to 65%. The left ventricle has normal function. The left ventricle has no regional wall motion abnormalities. Left ventricular diastolic parameters were normal.  2. Right ventricular systolic function is normal. The right ventricular size is normal. There is normal pulmonary artery systolic pressure. The estimated right ventricular systolic pressure is 34.7 mmHg.  3. The mitral valve is normal in structure. Trivial mitral valve regurgitation. No evidence of mitral stenosis.  4. The aortic valve is tricuspid. Aortic valve regurgitation is mild. Mild aortic valve sclerosis is present, with no evidence of aortic valve stenosis.  5. Aortic dilatation noted. There is moderate dilatation of the  aortic root, measuring 45 mm.  6. The inferior vena cava is dilated in size with >50% respiratory variability, suggesting right atrial pressure of 8 mmHg. FINDINGS  Left Ventricle: Left ventricular ejection fraction, by estimation, is 60 to 65%. The left ventricle has normal function. The left ventricle has no regional wall motion abnormalities. The left ventricular internal cavity size was normal in size. There is  no left ventricular hypertrophy. Left ventricular diastolic parameters were normal. Normal left ventricular filling pressure. Right Ventricle: The right ventricular size is normal. No increase in right ventricular wall thickness. Right ventricular systolic function is normal. There is normal pulmonary artery systolic pressure. The tricuspid regurgitant velocity is 2.09 m/s, and  with an assumed right atrial pressure of 8 mmHg, the estimated right ventricular systolic pressure is 42.5 mmHg. Left Atrium: Left atrial size was normal in size. Right Atrium: Right atrial size was normal in size. Pericardium: There is no evidence of pericardial effusion. Mitral Valve: The mitral valve is normal in structure. Trivial mitral valve regurgitation. No evidence of mitral valve stenosis. Tricuspid Valve: The tricuspid valve is normal in structure. Tricuspid valve regurgitation is trivial. No evidence of tricuspid stenosis. Aortic Valve: The aortic valve is tricuspid. Aortic valve regurgitation is mild. Aortic regurgitation PHT measures 312 msec. Mild aortic valve sclerosis is present, with no evidence of aortic valve stenosis. Aortic valve mean gradient measures 2.0 mmHg. Aortic valve peak gradient measures 4.2 mmHg. Aortic valve area, by VTI measures 2.82 cm. Pulmonic Valve: The pulmonic valve was normal in structure. Pulmonic valve regurgitation is not visualized. No evidence of pulmonic stenosis. Aorta: Aortic dilatation noted. There is moderate dilatation of the aortic root, measuring 45 mm. Venous: The inferior  vena cava is dilated in size with greater than 50% respiratory variability, suggesting right atrial pressure of 8 mmHg. IAS/Shunts: No atrial level shunt detected by color flow Doppler.  LEFT VENTRICLE PLAX 2D LVIDd:         4.50 cm  Diastology LVIDs:         3.10 cm  LV e' medial:    7.94 cm/s LV PW:         1.20 cm  LV E/e' medial:  12.7 LV IVS:        1.10 cm  LV e' lateral:   11.40 cm/s LVOT diam:     2.20 cm  LV E/e' lateral: 8.9 LV SV:         60 LV SV Index:   33 LVOT Area:     3.80 cm  RIGHT VENTRICLE            IVC RV Basal diam:  3.20 cm    IVC diam: 2.20 cm RV S prime:  9.68 cm/s TAPSE (M-mode): 1.8 cm LEFT ATRIUM             Index       RIGHT ATRIUM           Index LA diam:        3.20 cm 1.72 cm/m  RA Area:     12.60 cm LA Vol (A2C):   53.0 ml 28.52 ml/m RA Volume:   32.40 ml  17.44 ml/m LA Vol (A4C):   32.6 ml 17.54 ml/m LA Biplane Vol: 45.9 ml 24.70 ml/m  AORTIC VALVE AV Area (Vmax):    2.74 cm AV Area (Vmean):   2.82 cm AV Area (VTI):     2.82 cm AV Vmax:           102.00 cm/s AV Vmean:          65.000 cm/s AV VTI:            0.214 m AV Peak Grad:      4.2 mmHg AV Mean Grad:      2.0 mmHg LVOT Vmax:         73.60 cm/s LVOT Vmean:        48.300 cm/s LVOT VTI:          0.159 m LVOT/AV VTI ratio: 0.74 AI PHT:            312 msec  AORTA Ao Root diam: 4.50 cm MITRAL VALVE                TRICUSPID VALVE MV Area (PHT): 4.01 cm     TR Peak grad:   17.5 mmHg MV Decel Time: 189 msec     TR Vmax:        209.00 cm/s MV E velocity: 101.00 cm/s MV A velocity: 88.50 cm/s   SHUNTS MV E/A ratio:  1.14         Systemic VTI:  0.16 m                             Systemic Diam: 2.20 cm Fransico Him MD Electronically signed by Fransico Him MD Signature Date/Time: 06/20/2021/3:14:09 PM    Final    Medications   Scheduled Meds:  atorvastatin  40 mg Oral QHS   carvedilol  3.125 mg Oral BID WC   finasteride  5 mg Oral QHS   LORazepam  0.25 mg Intravenous Once   sodium bicarbonate  1,300 mg Oral BID    tamsulosin  0.4 mg Oral QPC supper     LOS: 4 days   Time spent: >59min  Richarda Osmond, DO [facility] 06/21/2021, 7:15 AM   To contact the [facility] Attending or Consulting provider for this patient: Check the care team in [facility] for a) attending/consulting [place] provider listed and b) the [facility] team listed Log into [email or web address] and use 's universal password to access. If you do not have the password, please contact the hospital operator. Locate the [facility] provider you are looking for under [facility] and page to a number that you can be directly reached. If you still have difficulty reaching the provider, please page the [facility] (Director on Call) for the Hospitalists listed on amion for assistance.

## 2021-06-21 NOTE — Plan of Care (Signed)
  Problem: Education: Goal: Knowledge of General Education information will improve Description: Including pain rating scale, medication(s)/side effects and non-pharmacologic comfort measures Outcome: Progressing   Problem: Health Behavior/Discharge Planning: Goal: Ability to manage health-related needs will improve Outcome: Progressing   Problem: Clinical Measurements: Goal: Ability to maintain clinical measurements within normal limits will improve Outcome: Progressing Goal: Will remain free from infection Outcome: Progressing Goal: Diagnostic test results will improve Outcome: Progressing Goal: Respiratory complications will improve Outcome: Progressing Goal: Cardiovascular complication will be avoided Outcome: Progressing   Problem: Activity: Goal: Risk for activity intolerance will decrease Outcome: Progressing   Problem: Nutrition: Goal: Adequate nutrition will be maintained Outcome: Progressing   Problem: Coping: Goal: Level of anxiety will decrease Outcome: Progressing   Problem: Elimination: Goal: Will not experience complications related to bowel motility Outcome: Progressing Goal: Will not experience complications related to urinary retention Outcome: Progressing   Problem: Pain Managment: Goal: General experience of comfort will improve Outcome: Progressing   Problem: Safety: Goal: Ability to remain free from injury will improve Outcome: Progressing   Problem: Skin Integrity: Goal: Risk for impaired skin integrity will decrease Outcome: Progressing   Problem: Education: Goal: Knowledge of disease and its progression will improve Outcome: Progressing Goal: Individualized Educational Video(s) Outcome: Progressing   Problem: Fluid Volume: Goal: Compliance with measures to maintain balanced fluid volume will improve Outcome: Progressing   Problem: Health Behavior/Discharge Planning: Goal: Ability to manage health-related needs will  improve Outcome: Progressing   Problem: Nutritional: Goal: Ability to make healthy dietary choices will improve Outcome: Progressing   Problem: Clinical Measurements: Goal: Complications related to the disease process, condition or treatment will be avoided or minimized Outcome: Progressing   Problem: Safety: Goal: Non-violent Restraint(s) Outcome: Progressing

## 2021-06-22 ENCOUNTER — Inpatient Hospital Stay (HOSPITAL_COMMUNITY): Payer: Medicare (Managed Care)

## 2021-06-22 DIAGNOSIS — N184 Chronic kidney disease, stage 4 (severe): Secondary | ICD-10-CM | POA: Diagnosis not present

## 2021-06-22 DIAGNOSIS — I469 Cardiac arrest, cause unspecified: Secondary | ICD-10-CM

## 2021-06-22 DIAGNOSIS — I351 Nonrheumatic aortic (valve) insufficiency: Secondary | ICD-10-CM | POA: Diagnosis not present

## 2021-06-22 DIAGNOSIS — I7121 Aneurysm of the ascending aorta, without rupture: Secondary | ICD-10-CM

## 2021-06-22 DIAGNOSIS — J939 Pneumothorax, unspecified: Secondary | ICD-10-CM | POA: Diagnosis not present

## 2021-06-22 DIAGNOSIS — I082 Rheumatic disorders of both aortic and tricuspid valves: Secondary | ICD-10-CM

## 2021-06-22 DIAGNOSIS — G40409 Other generalized epilepsy and epileptic syndromes, not intractable, without status epilepticus: Secondary | ICD-10-CM | POA: Diagnosis not present

## 2021-06-22 DIAGNOSIS — N179 Acute kidney failure, unspecified: Secondary | ICD-10-CM | POA: Diagnosis not present

## 2021-06-22 DIAGNOSIS — T17908A Unspecified foreign body in respiratory tract, part unspecified causing other injury, initial encounter: Secondary | ICD-10-CM | POA: Diagnosis not present

## 2021-06-22 LAB — CBC
HCT: 22.2 % — ABNORMAL LOW (ref 39.0–52.0)
HCT: 22.4 % — ABNORMAL LOW (ref 39.0–52.0)
HCT: 27.8 % — ABNORMAL LOW (ref 39.0–52.0)
Hemoglobin: 7.5 g/dL — ABNORMAL LOW (ref 13.0–17.0)
Hemoglobin: 7.5 g/dL — ABNORMAL LOW (ref 13.0–17.0)
Hemoglobin: 9.3 g/dL — ABNORMAL LOW (ref 13.0–17.0)
MCH: 32.3 pg (ref 26.0–34.0)
MCH: 32.1 pg (ref 26.0–34.0)
MCH: 31.1 pg (ref 26.0–34.0)
MCHC: 33.8 g/dL (ref 30.0–36.0)
MCHC: 33.5 g/dL (ref 30.0–36.0)
MCHC: 33.5 g/dL (ref 30.0–36.0)
MCV: 95.7 fL (ref 80.0–100.0)
MCV: 95.7 fL (ref 80.0–100.0)
MCV: 93 fL (ref 80.0–100.0)
Platelets: 142 10*3/uL — ABNORMAL LOW (ref 150–400)
Platelets: 173 10*3/uL (ref 150–400)
Platelets: 184 10*3/uL (ref 150–400)
RBC: 2.32 MIL/uL — ABNORMAL LOW (ref 4.22–5.81)
RBC: 2.34 MIL/uL — ABNORMAL LOW (ref 4.22–5.81)
RBC: 2.99 MIL/uL — ABNORMAL LOW (ref 4.22–5.81)
RDW: 12.2 % (ref 11.5–15.5)
RDW: 12.2 % (ref 11.5–15.5)
RDW: 13.2 % (ref 11.5–15.5)
WBC: 5.5 10*3/uL (ref 4.0–10.5)
WBC: 9.8 10*3/uL (ref 4.0–10.5)
WBC: 9.2 10*3/uL (ref 4.0–10.5)
nRBC: 0 % (ref 0.0–0.2)
nRBC: 0 % (ref 0.0–0.2)
nRBC: 0 % (ref 0.0–0.2)

## 2021-06-22 LAB — POCT I-STAT 7, (LYTES, BLD GAS, ICA,H+H)
Acid-base deficit: 6 mmol/L — ABNORMAL HIGH (ref 0.0–2.0)
Bicarbonate: 19.6 mmol/L — ABNORMAL LOW (ref 20.0–28.0)
Calcium, Ion: 1.2 mmol/L (ref 1.15–1.40)
HCT: 19 % — ABNORMAL LOW (ref 39.0–52.0)
Hemoglobin: 6.5 g/dL — CL (ref 13.0–17.0)
O2 Saturation: 100 %
Patient temperature: 98.7
Potassium: 4.5 mmol/L (ref 3.5–5.1)
Sodium: 142 mmol/L (ref 135–145)
TCO2: 21 mmol/L — ABNORMAL LOW (ref 22–32)
pCO2 arterial: 36.7 mmHg (ref 32.0–48.0)
pH, Arterial: 7.335 — ABNORMAL LOW (ref 7.350–7.450)
pO2, Arterial: 313 mmHg — ABNORMAL HIGH (ref 83.0–108.0)

## 2021-06-22 LAB — PROTEIN ELECTROPHORESIS, SERUM
A/G Ratio: 1.2 (ref 0.7–1.7)
Albumin ELP: 3.2 g/dL (ref 2.9–4.4)
Alpha-1-Globulin: 0.2 g/dL (ref 0.0–0.4)
Alpha-2-Globulin: 0.6 g/dL (ref 0.4–1.0)
Beta Globulin: 0.7 g/dL (ref 0.7–1.3)
Gamma Globulin: 1 g/dL (ref 0.4–1.8)
Globulin, Total: 2.6 g/dL (ref 2.2–3.9)
Total Protein ELP: 5.8 g/dL — ABNORMAL LOW (ref 6.0–8.5)

## 2021-06-22 LAB — COMPREHENSIVE METABOLIC PANEL
ALT: 49 U/L — ABNORMAL HIGH (ref 0–44)
AST: 64 U/L — ABNORMAL HIGH (ref 15–41)
Albumin: 2.5 g/dL — ABNORMAL LOW (ref 3.5–5.0)
Alkaline Phosphatase: 61 U/L (ref 38–126)
Anion gap: 8 (ref 5–15)
BUN: 38 mg/dL — ABNORMAL HIGH (ref 8–23)
CO2: 21 mmol/L — ABNORMAL LOW (ref 22–32)
Calcium: 8.5 mg/dL — ABNORMAL LOW (ref 8.9–10.3)
Chloride: 112 mmol/L — ABNORMAL HIGH (ref 98–111)
Creatinine, Ser: 5.22 mg/dL — ABNORMAL HIGH (ref 0.61–1.24)
GFR, Estimated: 11 mL/min — ABNORMAL LOW (ref >60)
Glucose, Bld: 169 mg/dL — ABNORMAL HIGH (ref 70–99)
Potassium: 4.8 mmol/L (ref 3.5–5.1)
Sodium: 141 mmol/L (ref 135–145)
Total Bilirubin: 0.7 mg/dL (ref 0.3–1.2)
Total Protein: 5.4 g/dL — ABNORMAL LOW (ref 6.5–8.1)

## 2021-06-22 LAB — RENAL FUNCTION PANEL
Albumin: 2.6 g/dL — ABNORMAL LOW (ref 3.5–5.0)
Anion gap: 8 (ref 5–15)
BUN: 38 mg/dL — ABNORMAL HIGH (ref 8–23)
CO2: 19 mmol/L — ABNORMAL LOW (ref 22–32)
Calcium: 8.6 mg/dL — ABNORMAL LOW (ref 8.9–10.3)
Chloride: 111 mmol/L (ref 98–111)
Creatinine, Ser: 5.04 mg/dL — ABNORMAL HIGH (ref 0.61–1.24)
GFR, Estimated: 12 mL/min — ABNORMAL LOW (ref >60)
Glucose, Bld: 123 mg/dL — ABNORMAL HIGH (ref 70–99)
Phosphorus: 4.3 mg/dL (ref 2.5–4.6)
Potassium: 4.6 mmol/L (ref 3.5–5.1)
Sodium: 138 mmol/L (ref 135–145)

## 2021-06-22 LAB — IRON AND TIBC
Iron: 26 ug/dL — ABNORMAL LOW (ref 45–182)
Saturation Ratios: 13 % — ABNORMAL LOW (ref 17.9–39.5)
TIBC: 195 ug/dL — ABNORMAL LOW (ref 250–450)
UIBC: 169 ug/dL

## 2021-06-22 LAB — VITAMIN B12: Vitamin B-12: 120 pg/mL — ABNORMAL LOW (ref 180–914)

## 2021-06-22 LAB — GLUCOSE, CAPILLARY
Glucose-Capillary: 121 mg/dL — ABNORMAL HIGH (ref 70–99)
Glucose-Capillary: 161 mg/dL — ABNORMAL HIGH (ref 70–99)
Glucose-Capillary: 119 mg/dL — ABNORMAL HIGH (ref 70–99)
Glucose-Capillary: 108 mg/dL — ABNORMAL HIGH (ref 70–99)

## 2021-06-22 LAB — ABO/RH: ABO/RH(D): O POS

## 2021-06-22 LAB — PREPARE RBC (CROSSMATCH)

## 2021-06-22 LAB — ECHOCARDIOGRAM LIMITED
Height: 68 in
Weight: 2497.37 oz

## 2021-06-22 LAB — FERRITIN: Ferritin: 230 ng/mL (ref 24–336)

## 2021-06-22 LAB — MRSA NEXT GEN BY PCR, NASAL: MRSA by PCR Next Gen: NOT DETECTED

## 2021-06-22 LAB — TROPONIN I (HIGH SENSITIVITY): Troponin I (High Sensitivity): 192 ng/L (ref <18)

## 2021-06-22 LAB — FOLATE: Folate: 11.2 ng/mL (ref >5.9)

## 2021-06-22 LAB — ANA W/REFLEX IF POSITIVE: Anti Nuclear Antibody (ANA): NEGATIVE

## 2021-06-22 LAB — MAGNESIUM: Magnesium: 1.7 mg/dL (ref 1.7–2.4)

## 2021-06-22 LAB — LACTIC ACID, PLASMA
Lactic Acid, Venous: 1.3 mmol/L (ref 0.5–1.9)
Lactic Acid, Venous: 1 mmol/L (ref 0.5–1.9)

## 2021-06-22 IMAGING — DX DG CHEST 1V PORT
1 series · 1 of 1 positions shown · non-contrast
Comparison: Chest x-ray [DATE].

CLINICAL DATA: 70-year-old male status post endotracheal tube
placement.

EXAM:
PORTABLE CHEST 1 VIEW

[chest ap]
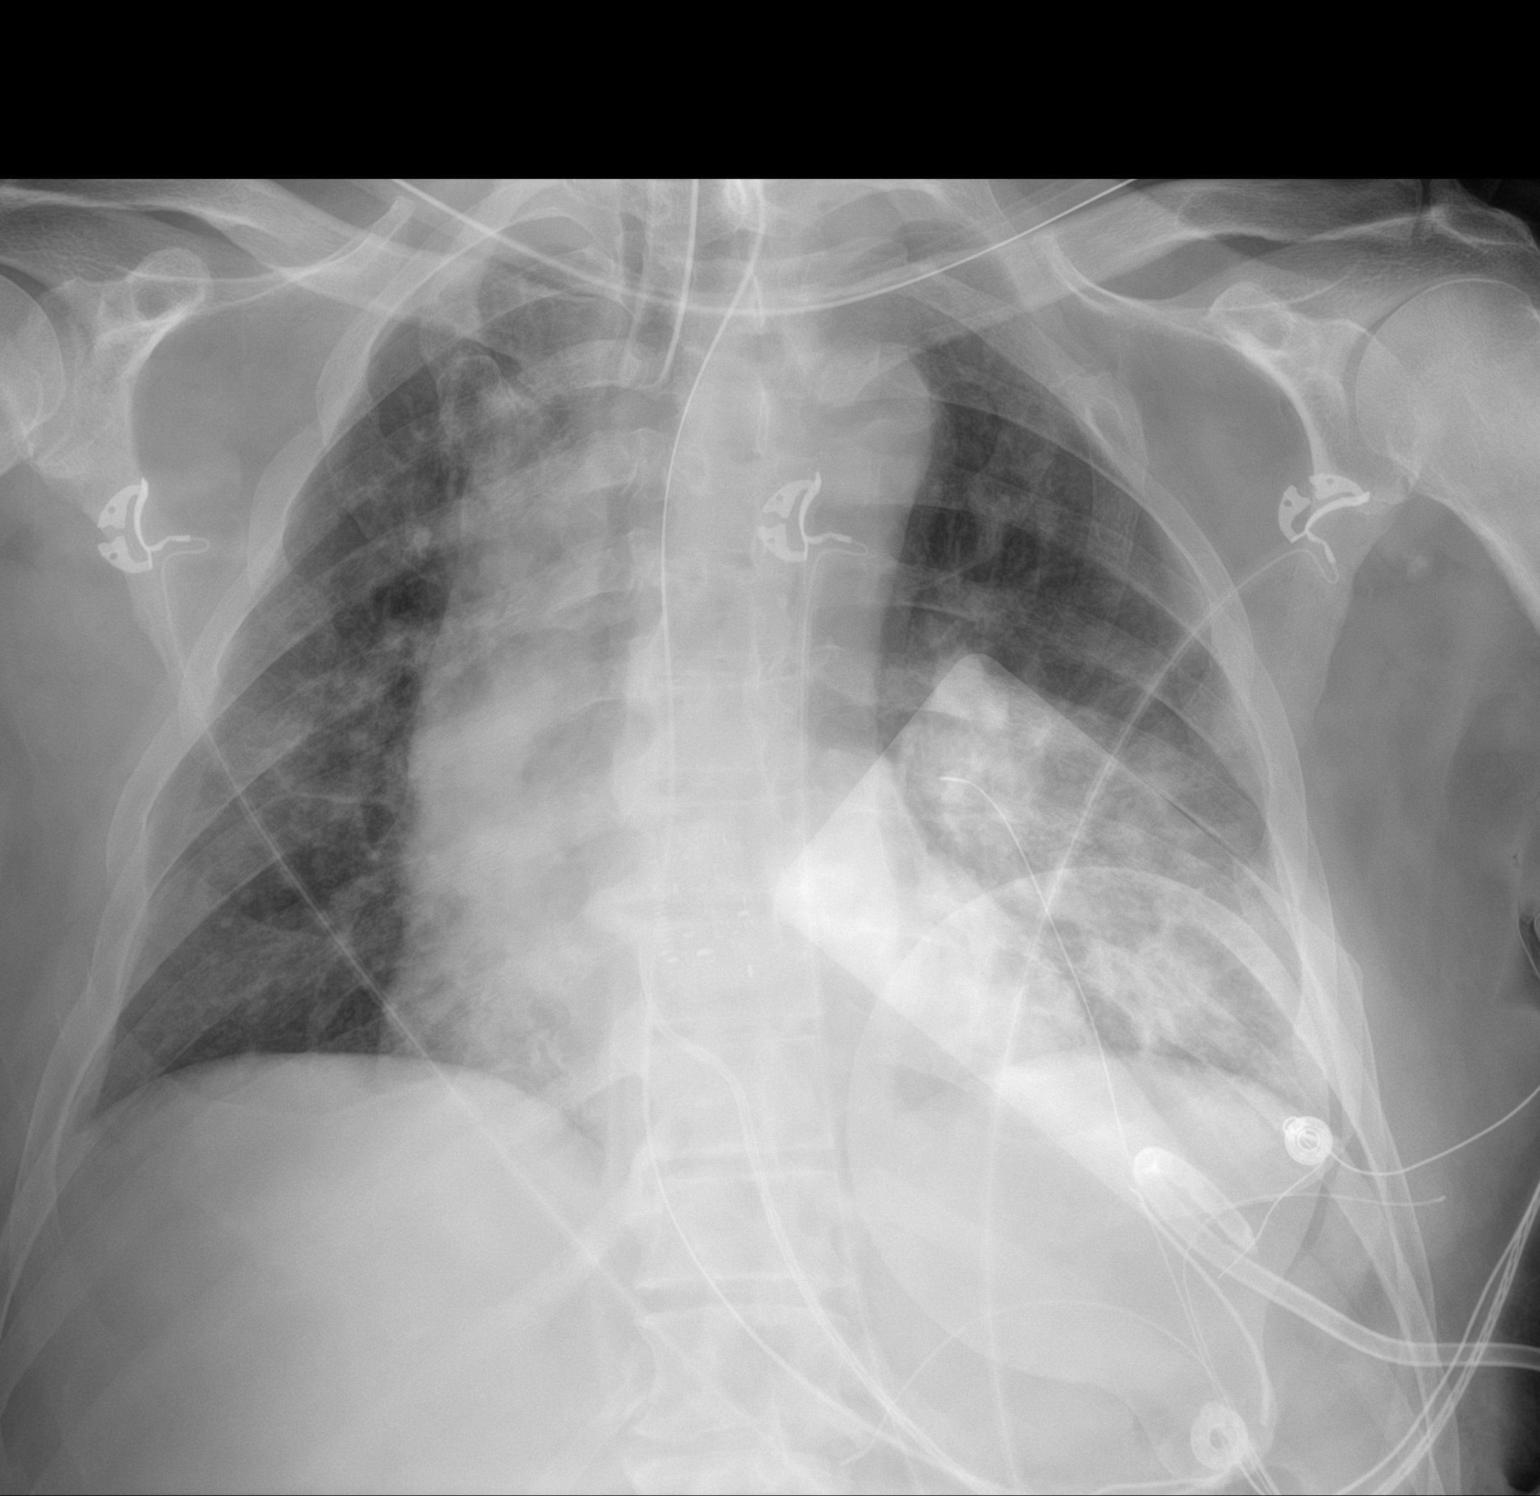

[1 of 1 positions shown; findings below may reference images not displayed]

FINDINGS: An endotracheal tube is in place with tip 6.9 cm above the carina. A
nasogastric tube is seen extending into the stomach, however, the
tip of the nasogastric tube extends below the lower margin of the
image. New small bore left-sided chest tube projecting over the
lower left hemithorax. Previously noted left-sided pneumothorax is
no longer readily apparent. Lung volumes are low. Patchy
interstitial opacities and ill-defined airspace disease noted in the
lungs bilaterally (left greater than right). No evidence of
pulmonary edema. Heart size is normal. Mediastinal contours are
grossly distorted by patient's rotation to the right.
IMPRESSION: 1. Support apparatus, as above.
2. Resolution of previously noted left-sided pneumothorax following
left-sided chest tube placement.
3. The appearance of the lungs is suggestive of multilobar bilateral
bronchopneumonia (left greater than right), as above.

## 2021-06-22 IMAGING — DX DG ABD PORTABLE 1V
1 series · 1 of 1 positions shown · non-contrast
Comparison: None.

CLINICAL DATA: Encounter for orogastric tube placement

EXAM:
PORTABLE ABDOMEN - 1 VIEW

[abdomen supine]
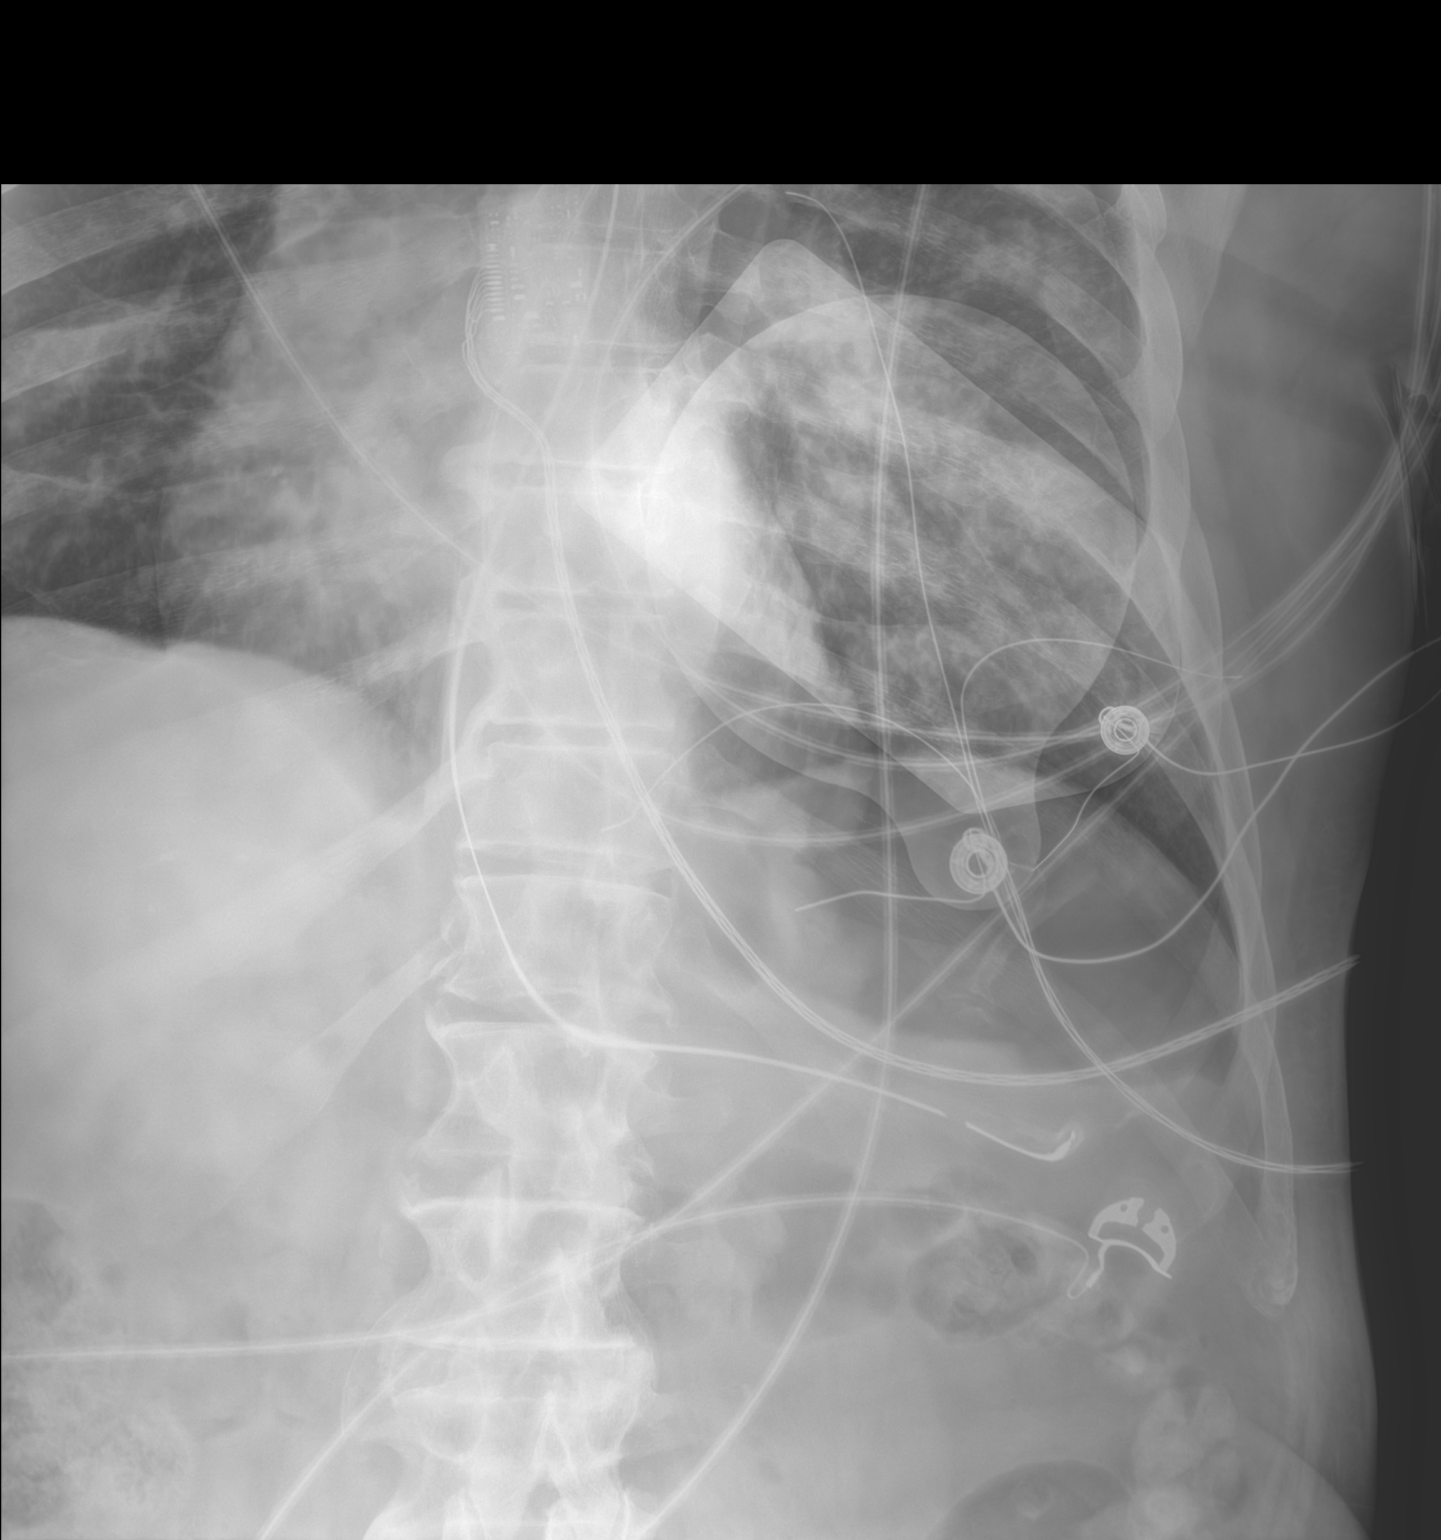

[1 of 1 positions shown; findings below may reference images not displayed]

FINDINGS: Gastric tube within the fundus of the stomach. Side port below the
GE junction. Perihilar airspace disease suggests edema.
IMPRESSION: Gastric tube in stomach.

## 2021-06-22 IMAGING — CT CT HEAD W/O CM
3 series · 16 of 47 positions shown, 19 images · non-contrast
Comparison: CT head dated [DATE]

CLINICAL DATA: Mental status change

EXAM:
CT HEAD WITHOUT CONTRAST
TECHNIQUE: Contiguous axial images were obtained from the base of the skull
through the vertex without intravenous contrast.

[Series 3: head 5.0 h30s · axial · 0.42mm/px · z∈[+1161,+1306]mm · 10 of 35 slices shown, 13 images]
[im 3/35  brain]
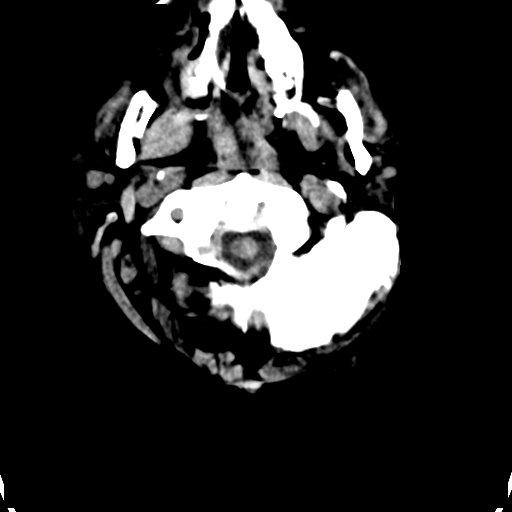
[im 3/35  bone]
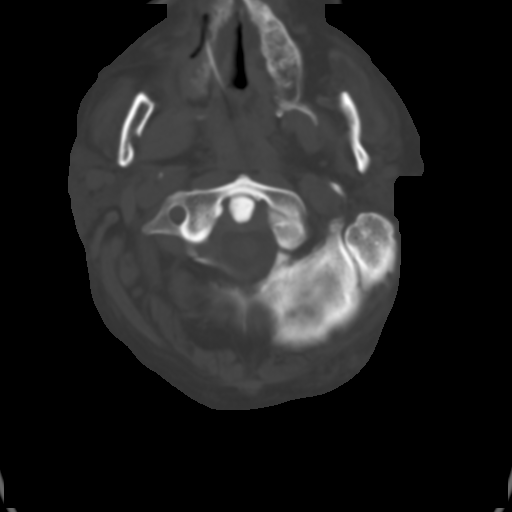
[im 6/35  brain]
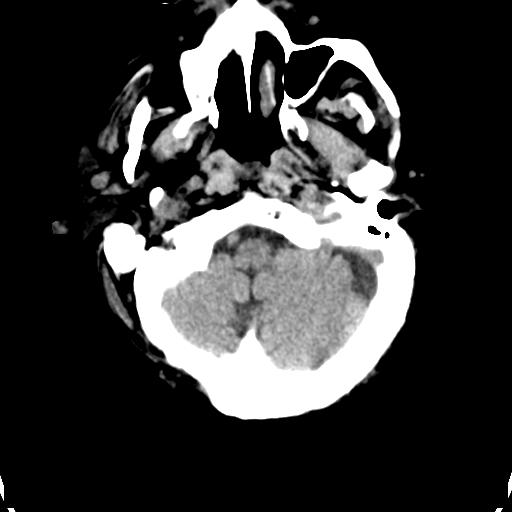
[im 10/35  brain]
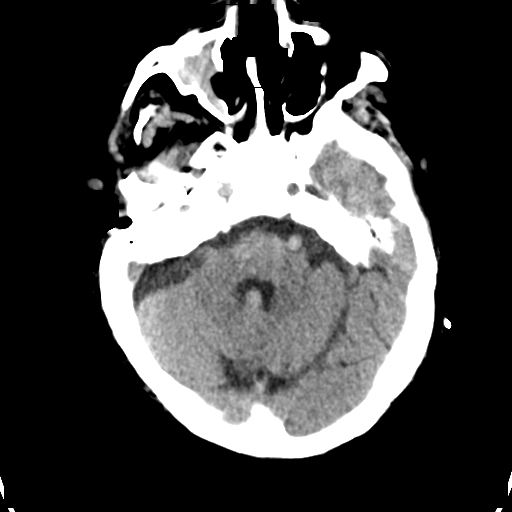
[im 12/35  brain]
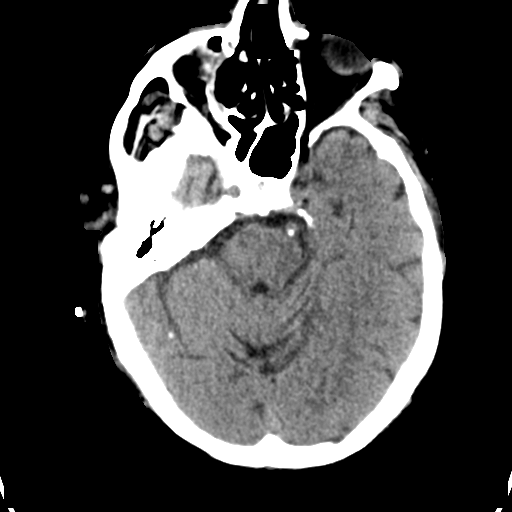
[im 16/35  brain]
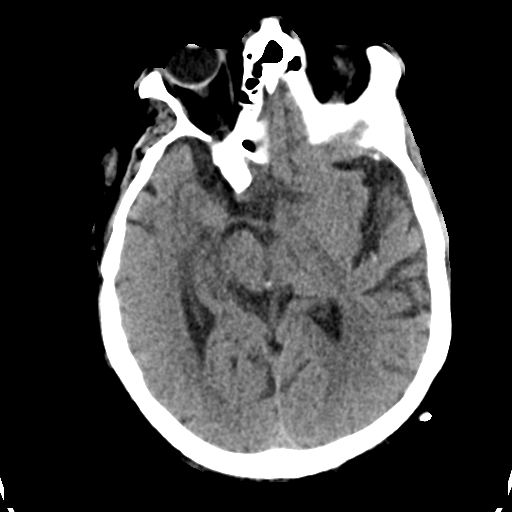
[im 16/35  bone]
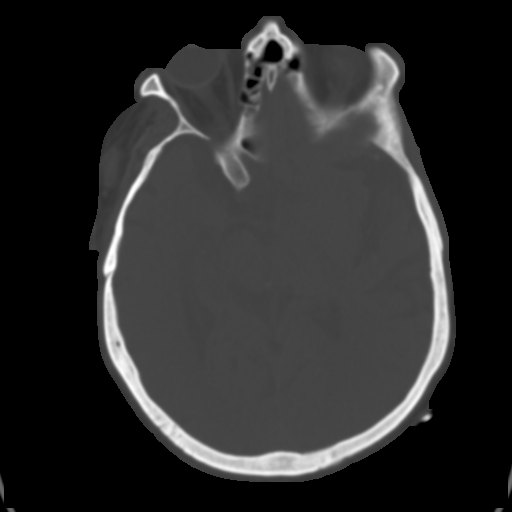
[im 19/35  brain]
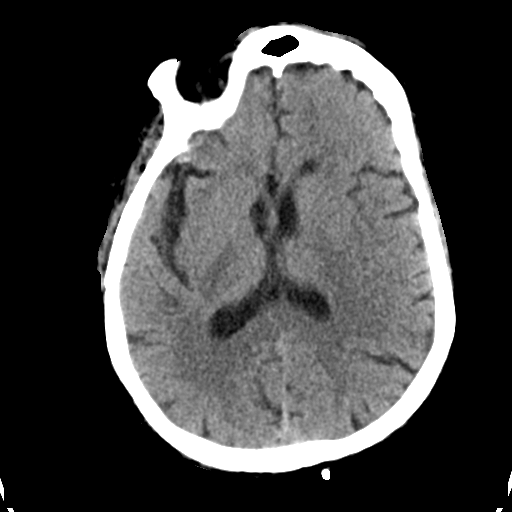
[im 23/35  brain]
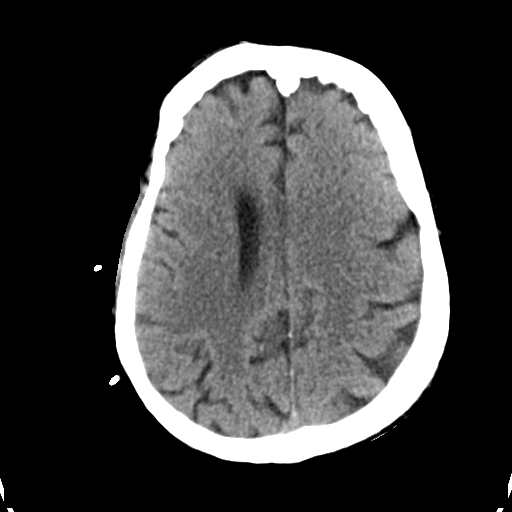
[im 26/35  brain]
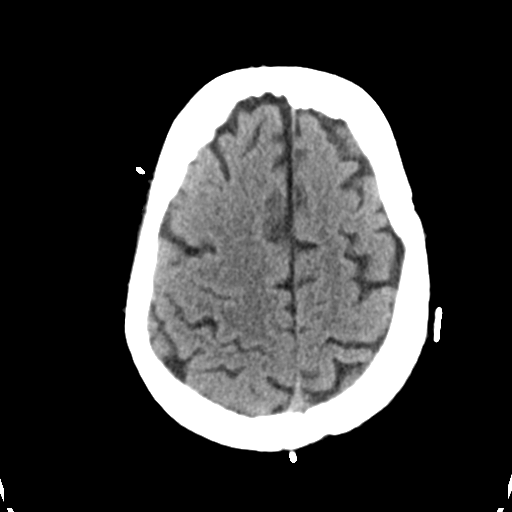
[im 29/35  brain]
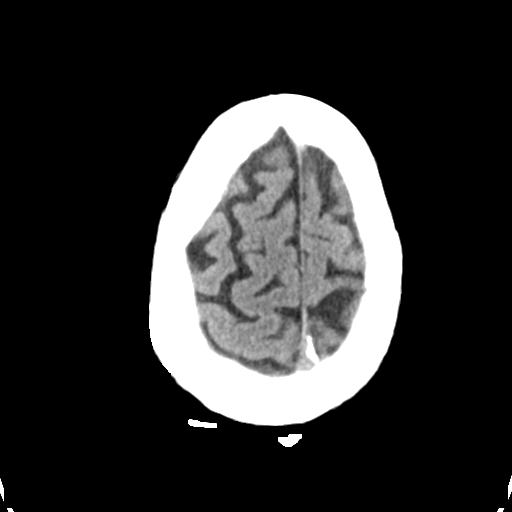
[im 29/35  bone]
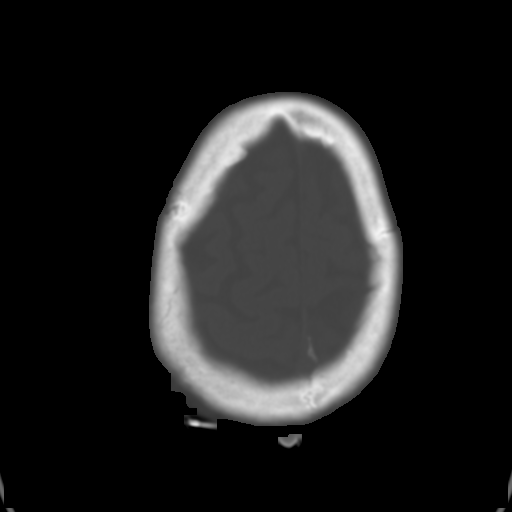
[im 32/35  brain]
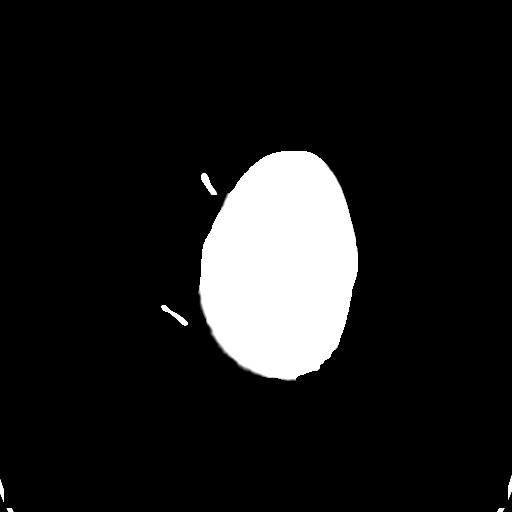

[Series 5: head 3.0 mpr cor · coronal · 0.33mm/px · 3 of 70 slices shown]
[im 24/70  brain]
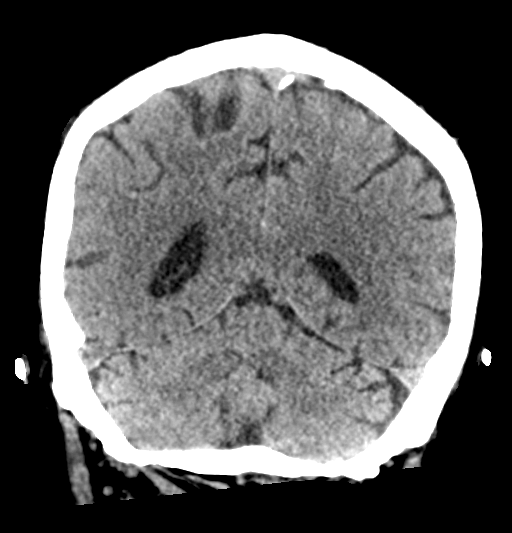
[im 31/70  brain]
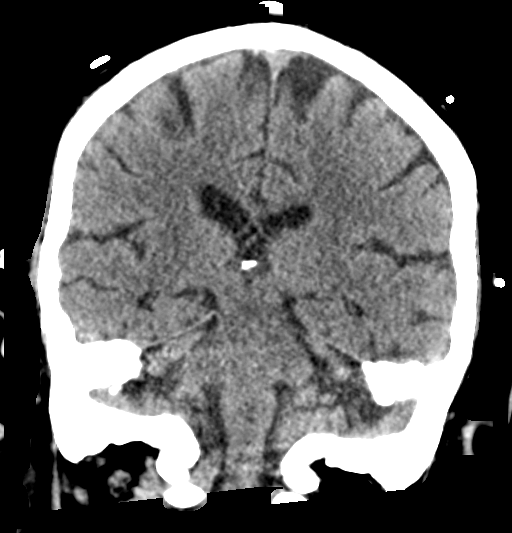
[im 39/70  brain]
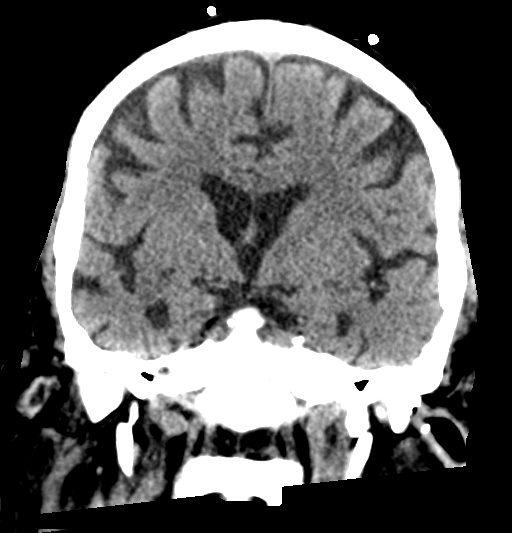

[Series 6: head 3.0 mpr sag · sagittal · 0.36mm/px · 3 of 59 slices shown]
[im 24/59  brain]
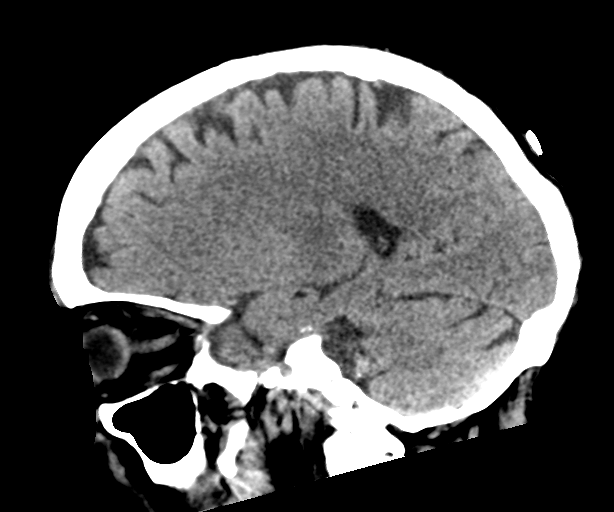
[im 30/59  brain]
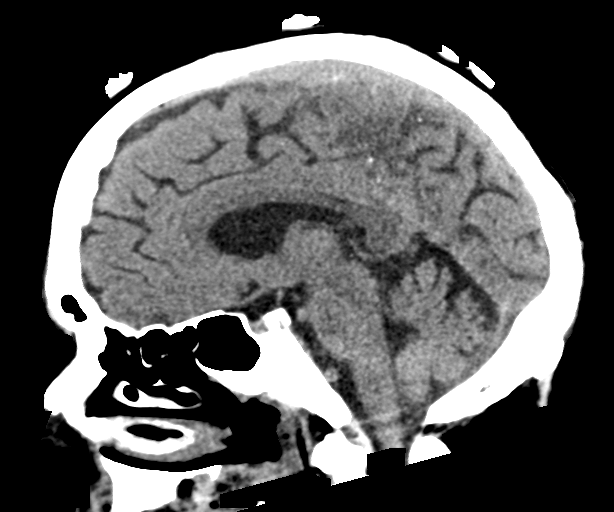
[im 36/59  brain]
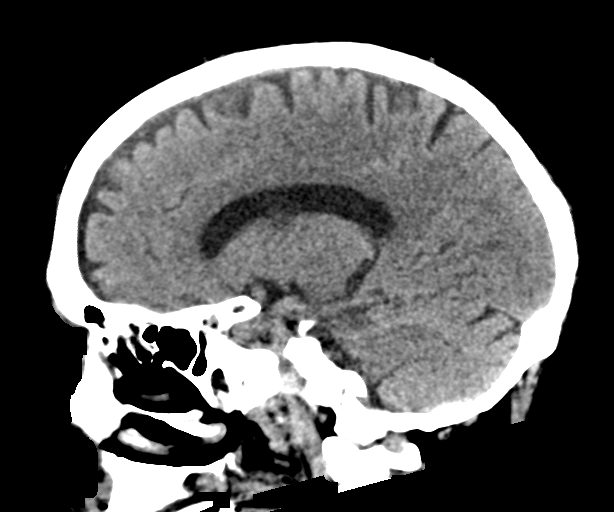

[16 of 47 positions shown; findings below may reference images not displayed]

FINDINGS: Brain: No evidence of acute infarction, hemorrhage, hydrocephalus,
extra-axial collection or mass lesion/mass effect.

Vascular: No hyperdense vessel or unexpected calcification.

Skull: Normal. Negative for fracture or focal lesion.

Sinuses/Orbits: Opacification of the maxillary sinus and mucosal
thickening of the ethmoid sinuses, similar to prior.

Other: None.
IMPRESSION: No acute intracranial abnormalities.

## 2021-06-22 IMAGING — DX DG CHEST 1V PORT
1 series · 1 of 1 positions shown · non-contrast
Comparison: Chest radiographs [DATE] and earlier.
COMPARISON: Chest radiographs [DATE] and earlier.

Addendum:
CLINICAL DATA: 70-year-old male intubated.  Aspiration.

EXAM:
PORTABLE CHEST 1 VIEW

[chest ap]
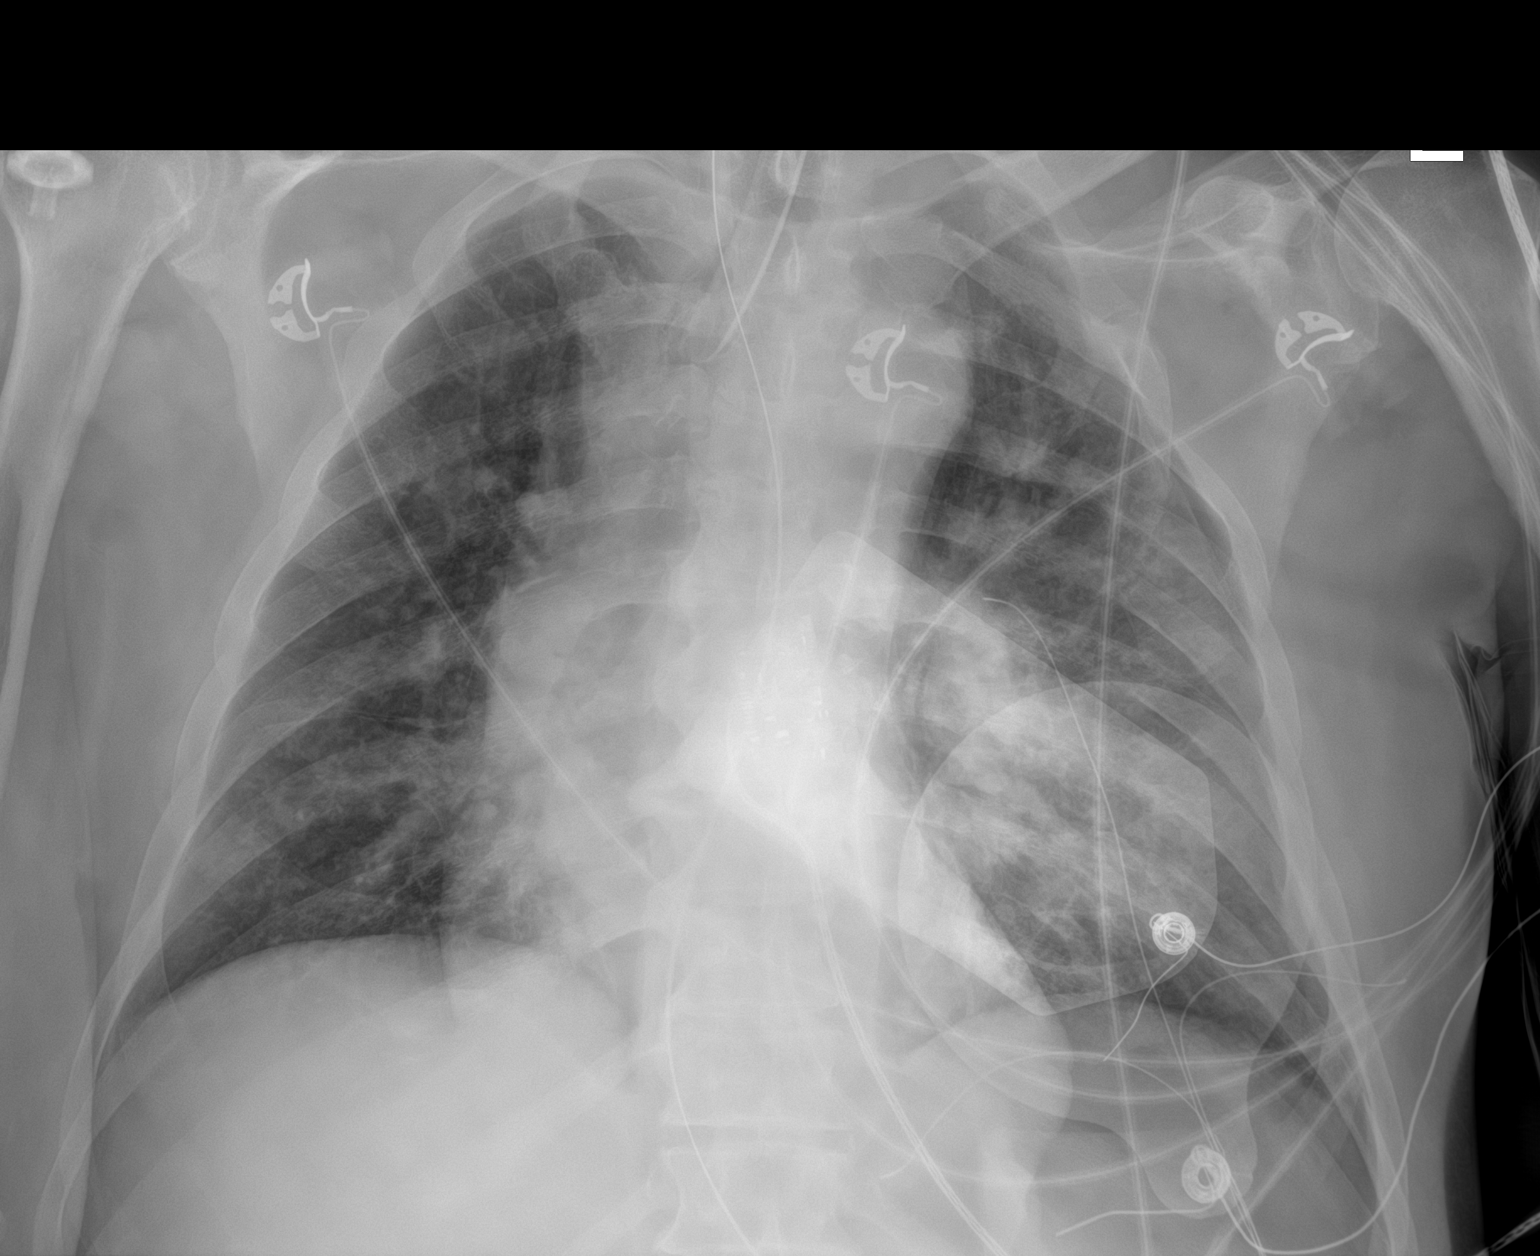

[1 of 1 positions shown; findings below may reference images not displayed]

FINDINGS: Portable AP supine view at [7W] hours. Endotracheal tube tip in good
position between the level the clavicles and carina. Enteric tube
courses to the abdomen, tip not included. Stable lung volumes and
mediastinal contours. Patchy and confluent new asymmetric bilateral
pulmonary opacity, more pronounced on the left and with a perihilar
predominance.

Additionally there is asymmetric left costophrenic angle lucency.
And a pleural edge is visible laterally on the left at the [DATE] rib
interspace. No pneumothorax was identified last month.

No superimposed pleural effusion.  No consolidation at this time.

Stable visualized osseous structures.
IMPRESSION: 1. Positive for Left-side Pneumothorax, at least Moderate on this
supine view.
Right side down lateral decubitus x-ray or Chest CT would best
evaluate further.

2. Intubated. Endotracheal tube tip in good position. Enteric tube
courses to the abdomen.

3. Patchy left greater than right bilateral perihilar opacity could
be aspiration, atelectasis, or infection in this setting.

ADDENDUM:
Critical Value/emergent results were called by telephone at the time
of interpretation on [DATE] at [7W] hours to PA MOREAU,
who verbally acknowledged these results.

*** End of Addendum ***
FINDINGS: Portable AP supine view at [7W] hours. Endotracheal tube tip in good
position between the level the clavicles and carina. Enteric tube
courses to the abdomen, tip not included. Stable lung volumes and
mediastinal contours. Patchy and confluent new asymmetric bilateral
pulmonary opacity, more pronounced on the left and with a perihilar
predominance.

Additionally there is asymmetric left costophrenic angle lucency.
And a pleural edge is visible laterally on the left at the [DATE] rib
interspace. No pneumothorax was identified last month.

No superimposed pleural effusion.  No consolidation at this time.

Stable visualized osseous structures.
IMPRESSION: 1. Positive for Left-side Pneumothorax, at least Moderate on this
supine view.
Right side down lateral decubitus x-ray or Chest CT would best
evaluate further.

2. Intubated. Endotracheal tube tip in good position. Enteric tube
courses to the abdomen.

3. Patchy left greater than right bilateral perihilar opacity could
be aspiration, atelectasis, or infection in this setting.

## 2021-06-22 MED ORDER — FENTANYL CITRATE PF 50 MCG/ML IJ SOSY: 25.0000 ug | PREFILLED_SYRINGE | INTRAMUSCULAR | Status: DC | PRN

## 2021-06-22 MED ORDER — INSULIN ASPART 100 UNIT/ML IJ SOLN
0.0000 [IU] | INTRAMUSCULAR | Status: DC
  Administered 2021-06-22: 2 [IU] via SUBCUTANEOUS
  Administered 2021-06-23: 1 [IU] via SUBCUTANEOUS
  Administered 2021-06-23: 2 [IU] via SUBCUTANEOUS
  Administered 2021-06-23 – 2021-06-24 (×6): 1 [IU] via SUBCUTANEOUS
  Administered 2021-06-24 – 2021-06-25 (×3): 2 [IU] via SUBCUTANEOUS
  Administered 2021-06-25: 1 [IU] via SUBCUTANEOUS
  Administered 2021-06-25: 2 [IU] via SUBCUTANEOUS
  Administered 2021-06-25: 1 [IU] via SUBCUTANEOUS
  Administered 2021-06-25 – 2021-06-26 (×3): 2 [IU] via SUBCUTANEOUS
  Administered 2021-06-26: 1 [IU] via SUBCUTANEOUS
  Administered 2021-06-27 (×3): 2 [IU] via SUBCUTANEOUS
  Administered 2021-06-27: 1 [IU] via SUBCUTANEOUS
  Administered 2021-06-27 – 2021-06-28 (×7): 2 [IU] via SUBCUTANEOUS
  Administered 2021-06-28: 3 [IU] via SUBCUTANEOUS
  Administered 2021-06-29: 2 [IU] via SUBCUTANEOUS
  Administered 2021-06-29 – 2021-06-30 (×5): 3 [IU] via SUBCUTANEOUS
  Administered 2021-06-30 (×2): 2 [IU] via SUBCUTANEOUS
  Administered 2021-06-30 – 2021-07-01 (×5): 3 [IU] via SUBCUTANEOUS
  Administered 2021-07-01: 2 [IU] via SUBCUTANEOUS
  Administered 2021-07-01 (×2): 3 [IU] via SUBCUTANEOUS
  Administered 2021-07-02 (×4): 2 [IU] via SUBCUTANEOUS

## 2021-06-22 MED ORDER — BUSPIRONE HCL 10 MG PO TABS
30.0000 mg | ORAL_TABLET | Freq: Three times a day (TID) | ORAL | Status: DC
  Administered 2021-06-22 (×2): 30 mg
  Filled 2021-06-22 (×2): qty 3

## 2021-06-22 MED ORDER — POLYETHYLENE GLYCOL 3350 17 G PO PACK
17.0000 g | PACK | Freq: Every day | ORAL | Status: DC
  Administered 2021-06-22 – 2021-06-29 (×5): 17 g
  Filled 2021-06-22 (×5): qty 1

## 2021-06-22 MED ORDER — ALBUTEROL SULFATE (2.5 MG/3ML) 0.083% IN NEBU: 2.5000 mg | INHALATION_SOLUTION | RESPIRATORY_TRACT | Status: DC | PRN

## 2021-06-22 MED ORDER — LEVETIRACETAM IN NACL 1000 MG/100ML IV SOLN
1000.0000 mg | Freq: Once | INTRAVENOUS | Status: AC
  Administered 2021-06-22: 1000 mg via INTRAVENOUS
  Filled 2021-06-22 (×2): qty 100

## 2021-06-22 MED ORDER — NOREPINEPHRINE 4 MG/250ML-% IV SOLN: 0.0000 ug/min | INTRAVENOUS | Status: AC

## 2021-06-22 MED ORDER — BUSPIRONE HCL 10 MG PO TABS: 30.0000 mg | ORAL_TABLET | Freq: Three times a day (TID) | ORAL | Status: DC

## 2021-06-22 MED ORDER — SODIUM BICARBONATE 650 MG PO TABS
1300.0000 mg | ORAL_TABLET | Freq: Two times a day (BID) | ORAL | Status: DC
  Administered 2021-06-22 – 2021-06-26 (×10): 1300 mg
  Filled 2021-06-22 (×9): qty 2

## 2021-06-22 MED ORDER — MIDAZOLAM HCL 2 MG/2ML IJ SOLN
2.0000 mg | Freq: Once | INTRAMUSCULAR | Status: AC
  Administered 2021-06-22: 2 mg via INTRAVENOUS

## 2021-06-22 MED ORDER — ONDANSETRON HCL 4 MG/2ML IJ SOLN: 4.0000 mg | Freq: Four times a day (QID) | INTRAMUSCULAR | Status: DC | PRN

## 2021-06-22 MED ORDER — MIDAZOLAM HCL 2 MG/2ML IJ SOLN
INTRAMUSCULAR | Status: AC
  Filled 2021-06-22: qty 2

## 2021-06-22 MED ORDER — LIDOCAINE-EPINEPHRINE 0.5 %-1:200000 IJ SOLN: 30.0000 mL | Freq: Once | INTRAMUSCULAR | Status: DC

## 2021-06-22 MED ORDER — EPINEPHRINE 1 MG/10ML IJ SOSY
PREFILLED_SYRINGE | INTRAMUSCULAR | Status: AC
  Filled 2021-06-22: qty 10

## 2021-06-22 MED ORDER — NOREPINEPHRINE 4 MG/250ML-% IV SOLN
INTRAVENOUS | Status: AC
  Filled 2021-06-22: qty 250

## 2021-06-22 MED ORDER — SODIUM CHLORIDE 0.9 % IV SOLN
250.0000 mL | INTRAVENOUS | Status: DC
Start: 2021-06-22 — End: 2021-07-02

## 2021-06-22 MED ORDER — ORAL CARE MOUTH RINSE
15.0000 mL | OROMUCOSAL | Status: DC
  Administered 2021-06-22 – 2021-07-02 (×101): 15 mL via OROMUCOSAL

## 2021-06-22 MED ORDER — MAGNESIUM SULFATE 2 GM/50ML IV SOLN
2.0000 g | Freq: Once | INTRAVENOUS | Status: AC
  Administered 2021-06-22: 2 g via INTRAVENOUS
  Filled 2021-06-22: qty 50

## 2021-06-22 MED ORDER — MIDAZOLAM HCL 2 MG/2ML IJ SOLN: 2.0000 mg | Freq: Once | INTRAMUSCULAR | Status: DC | PRN

## 2021-06-22 MED ORDER — SODIUM CHLORIDE 0.9% IV SOLUTION: Freq: Once | INTRAVENOUS | Status: AC

## 2021-06-22 MED ORDER — ATROPINE SULFATE 1 MG/10ML IJ SOSY
PREFILLED_SYRINGE | INTRAMUSCULAR | Status: AC
  Filled 2021-06-22: qty 10

## 2021-06-22 MED ORDER — BETHANECHOL CHLORIDE 10 MG PO TABS
10.0000 mg | ORAL_TABLET | Freq: Three times a day (TID) | ORAL | Status: DC
  Administered 2021-06-22 – 2021-07-01 (×30): 10 mg
  Filled 2021-06-22 (×30): qty 1

## 2021-06-22 MED ORDER — CHLORHEXIDINE GLUCONATE CLOTH 2 % EX PADS
6.0000 | MEDICATED_PAD | Freq: Every day | CUTANEOUS | Status: DC
  Administered 2021-06-22 – 2021-07-01 (×10): 6 via TOPICAL

## 2021-06-22 MED ORDER — SODIUM CHLORIDE 0.9 % IV SOLN
250.0000 mg | Freq: Every day | INTRAVENOUS | Status: AC
  Administered 2021-06-23 – 2021-06-24 (×2): 250 mg via INTRAVENOUS
  Filled 2021-06-22 (×2): qty 20

## 2021-06-22 MED ORDER — ALBUTEROL SULFATE (2.5 MG/3ML) 0.083% IN NEBU
INHALATION_SOLUTION | RESPIRATORY_TRACT | Status: AC
  Administered 2021-06-22: 2.5 mg
  Filled 2021-06-22: qty 3

## 2021-06-22 MED ORDER — CHLORHEXIDINE GLUCONATE 0.12% ORAL RINSE (MEDLINE KIT)
15.0000 mL | Freq: Two times a day (BID) | OROMUCOSAL | Status: DC
  Administered 2021-06-22 – 2021-07-02 (×21): 15 mL via OROMUCOSAL

## 2021-06-22 MED ORDER — ACETAMINOPHEN 325 MG PO TABS
650.0000 mg | ORAL_TABLET | ORAL | Status: DC
  Administered 2021-07-02: 650 mg via ORAL
  Filled 2021-06-22 (×2): qty 2

## 2021-06-22 MED ORDER — FENTANYL CITRATE PF 50 MCG/ML IJ SOSY: 25.0000 ug | PREFILLED_SYRINGE | Freq: Once | INTRAMUSCULAR | Status: AC

## 2021-06-22 MED ORDER — LIDOCAINE HCL (PF) 1 % IJ SOLN
INTRAMUSCULAR | Status: AC
  Filled 2021-06-22: qty 30

## 2021-06-22 MED ORDER — FENTANYL 2500MCG IN NS 250ML (10MCG/ML) PREMIX INFUSION
25.0000 ug/h | INTRAVENOUS | Status: DC
  Administered 2021-06-22: 25 ug/h via INTRAVENOUS
  Filled 2021-06-22: qty 250

## 2021-06-22 MED ORDER — HYDRALAZINE HCL 20 MG/ML IJ SOLN
10.0000 mg | INTRAMUSCULAR | Status: DC | PRN
  Administered 2021-06-22: 10 mg via INTRAVENOUS
  Filled 2021-06-22: qty 1

## 2021-06-22 MED ORDER — ACETAMINOPHEN 650 MG RE SUPP: 650.0000 mg | RECTAL | Status: DC

## 2021-06-22 MED ORDER — QUETIAPINE FUMARATE 25 MG PO TABS: 12.5000 mg | ORAL_TABLET | Freq: Every day | ORAL | Status: DC

## 2021-06-22 MED ORDER — PANTOPRAZOLE SODIUM 40 MG IV SOLR
40.0000 mg | Freq: Every day | INTRAVENOUS | Status: DC
  Administered 2021-06-22 – 2021-07-02 (×11): 40 mg via INTRAVENOUS
  Filled 2021-06-22 (×11): qty 40

## 2021-06-22 MED ORDER — FENTANYL BOLUS VIA INFUSION
25.0000 ug | INTRAVENOUS | Status: DC | PRN
  Filled 2021-06-22: qty 100

## 2021-06-22 MED ORDER — LIDOCAINE-EPINEPHRINE 1 %-1:100000 IJ SOLN: 30.0000 mL | Freq: Once | INTRAMUSCULAR | Status: DC

## 2021-06-22 MED ORDER — HEPARIN SODIUM (PORCINE) 5000 UNIT/ML IJ SOLN
5000.0000 [IU] | Freq: Three times a day (TID) | INTRAMUSCULAR | Status: DC
  Administered 2021-06-22 – 2021-06-29 (×21): 5000 [IU] via SUBCUTANEOUS
  Filled 2021-06-22 (×20): qty 1

## 2021-06-22 MED ORDER — ATORVASTATIN CALCIUM 40 MG PO TABS
40.0000 mg | ORAL_TABLET | Freq: Every day | ORAL | Status: DC
  Administered 2021-06-22 – 2021-07-01 (×10): 40 mg
  Filled 2021-06-22 (×9): qty 1

## 2021-06-22 MED ORDER — ACETAMINOPHEN 160 MG/5ML PO SOLN
650.0000 mg | ORAL | Status: DC
  Administered 2021-06-22 – 2021-07-02 (×56): 650 mg
  Filled 2021-06-22 (×58): qty 20.3

## 2021-06-22 MED ORDER — DOCUSATE SODIUM 50 MG/5ML PO LIQD
100.0000 mg | Freq: Two times a day (BID) | ORAL | Status: DC
  Administered 2021-06-22 – 2021-07-02 (×10): 100 mg
  Filled 2021-06-22 (×11): qty 10

## 2021-06-22 NOTE — Consult Note (Addendum)
NAME:  Scott Woods, MRN:  [id number], DOB:  January 14, 1951, LOS: 5 ADMISSION DATE:  05/26/2021, CONSULTATION DATE:  06/22/21 REFERRING MD:  Code team, CHIEF COMPLAINT:  PEA arrest   History of Present Illness:   Scott Woods is a 70 y.o. 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  He denied any preceding symptoms.  He had a negative head CT, was found to have a creatinine of 6.2 above baseline of 2.7.  His hyperkalemia improved with Lokelma and insulin.  He was admitted to the hospitalist service.  On 10/4 overnight pt became agitated and hypertensive and was given ativan and hydralazine.  A short time later he suffered PEA arrest, ROSC obtained after 20 minutes CPR.  Post-intubation CXR with L pneumothorax  Pertinent  Medical History   has a past medical history of Adrenal nodule ([place]), Blood in the urine (05/08/2015), Borderline diabetes, Chronic kidney disease, CKD (chronic kidney disease), stage II, Diabetes mellitus without complication ([place]), Elevated PSA, Gross hematuria, H/O type B viral hepatitis (05/08/2015), History of hepatitis B, HTN (hypertension), Hyperlipidemia with target LDL less than 100, Obesity, Pre-ulcerative calluses (03/28/2016), and Prostate cancer ([place]).   Significant Hospital Events: Including procedures, antibiotic start and stop dates in addition to other pertinent events   9/29 admitted to hospitalist service 10/4 PEA arrest, intubation  Interim History / Subjective:  No pressor requirement post-ROSC, starting to respond  Objective   Blood pressure (!) 146/88, pulse 84, temperature 98.5 F (36.9 C), temperature source Oral, resp. rate 18, height 5\' 8"  (1.727 m), weight 71.4 kg, SpO2 100 %.    Vent Mode: PRVC FiO2 (%):  [100 %] 100 % Set Rate:  [18 bmp] 18 bmp Vt Set:  [550 mL] 550 mL PEEP:  [5  cmH20] 5 cmH20 Plateau Pressure:  [14 cmH20] 14 cmH20   Intake/Output Summary (Last 24 hours) at 06/22/2021 0428 Last data filed at 06/22/2021 0400 Gross per 24 hour  Intake 1646.59 ml  Output 350 ml  Net 1296.59 ml   Filed Weights   05/26/2021 2051 06/19/21 0425 06/21/21 0302  Weight: 68 kg 69 kg 71.4 kg    General:  elderly M, intubated and minimally responsive HEENT: MM pink/moist, ETT in place Neuro: withdraws to pain, otherwise not responsive to voice,  CV: s1s2 tachycardic, no m/r/g PULM:  decreased air movement LLL GI: soft, bsx4 active  Extremities: warm/dry, no edema  Skin: no rashes or lesions   Resolved Hospital Problem list     Assessment & Plan:   Witnessed in hospital PEA arrest with subsequent hypoxic respiratory failure 20 minutes ACLS before ROSC Currently unclear etiology P: -Continue supportive care, repeat labs are pending -Potassium was within normal limits before code -Check repeat limited echo -Consider PE in the setting of admitted with syncope, however echo 2 days ago did not show evidence of right heart strain and patient is not hypotensive  -Target normothermia --Maintain full vent support with SAT/SBT as tolerated -titrate Vent setting to maintain SpO2 greater than or equal to 90%. -HOB elevated 30 degrees. -Plateau pressures less than 30 cm H20.  -Follow chest x-ray, ABG prn.   -Bronchial hygiene and RT/bronchodilator protocol.     Anemia Hemoglobin 6.5 overnight, only downtrending since admission, no signs of bleeding P: -Check type and screen and transfer and 1 unit PRBCs -Follow CBC   Acute  on chronic CKD Hyperkalemia had improved P: -Nephrology following and work-up for glomerulonephritis continuing -Urine output, renal indices and avoid nephrotoxins as able   Hypertension -Hold beta-blocker in the setting of postarrest borderline low blood pressure  Best Practice (right click and "Reselect all SmartList Selections" daily)    Diet/type: NPO DVT prophylaxis: prophylactic heparin  GI prophylaxis: PPI Lines: N/A Foley:  N/A Code Status:  full code Last date of multidisciplinary goals of care discussion []   Labs   CBC: Recent Labs  Lab 05/20/2021 2100 06/18/21 0425 06/22/21 0023  WBC 8.8 7.0 5.5  NEUTROABS 7.0  --   --   HGB 8.2* 7.7* 7.5*  HCT 25.6* 23.2* 22.2*  MCV 100.0 97.9 95.7  PLT 192 158 142*    Basic Metabolic Panel: Recent Labs  Lab 06/18/21 0119 06/18/21 0425 06/18/21 1211 06/19/21 0026 06/20/21 0032 06/21/21 0017 06/22/21 0023  NA  --  136 135 134* 136 137 138  K  --  6.0* 5.7* 5.1 4.9 4.6 4.6  CL  --  107 108 107 111 112* 111  CO2  --  20* 20* 20* 18* 17* 19*  GLUCOSE  --  115* 128* 116* 101* 112* 123*  BUN  --  40* 41* 40* 41* 38* 38*  CREATININE  --  6.13* 5.97* 5.83* 5.62* 5.17* 5.04*  CALCIUM  --  9.3 8.9 8.5* 8.5* 8.4* 8.6*  MG 2.1 2.1  --   --   --   --   --   PHOS  --  4.3  --  5.0* 5.5* 4.9* 4.3   GFR: Estimated Creatinine Clearance: 13.4 mL/min (A) (by C-G formula based on SCr of 5.04 mg/dL (H)). Recent Labs  Lab 05/21/2021 2100 06/10/2021 2145 06/18/21 0119 06/18/21 0425 06/22/21 0023  WBC 8.8  --   --  7.0 5.5  LATICACIDVEN  --  0.9 0.9  --   --     Liver Function Tests: Recent Labs  Lab 06/11/2021 2100 06/18/21 0425 06/19/21 0026 06/20/21 0032 06/21/21 0017 06/22/21 0023  AST 16 15  --   --   --   --   ALT 18 16  --   --   --   --   ALKPHOS 66 63  --   --   --   --   BILITOT 0.5 1.0  --   --   --   --   PROT 6.5 6.0*  --   --   --   --   ALBUMIN 3.1* 2.9* 2.6* 2.6* 2.4* 2.6*   No results for input(s): LIPASE, AMYLASE in the last 168 hours. No results for input(s): AMMONIA in the last 168 hours.  ABG No results found for: PHART, PCO2ART, PO2ART, HCO3, TCO2, ACIDBASEDEF, O2SAT   Coagulation Profile: Recent Labs  Lab 06/18/21 0425  INR 1.1    Cardiac Enzymes: Recent Labs  Lab 06/18/21 0119  CKTOTAL 56    HbA1C: Hemoglobin A1C   Date/Time Value Ref Range Status  06/03/2021 03:46 PM 5.5 4.0 - 5.6 % Final  02/11/2021 10:58 AM 6.4 (A) 4.0 - 5.6 % Final   Hgb A1c MFr Bld  Date/Time Value Ref Range Status  10/02/2020 09:54 AM 6.9 (H) <5.7 % of total Hgb Final    Comment:    For someone without known diabetes, a hemoglobin A1c value of 6.5% or greater indicates that they may have  diabetes and this should be confirmed with a follow-up  test. . For  someone with known diabetes, a value <7% indicates  that their diabetes is well controlled and a value  greater than or equal to 7% indicates suboptimal  control. A1c targets should be individualized based on  duration of diabetes, age, comorbid conditions, and  other considerations. . Currently, no consensus exists regarding use of hemoglobin A1c for diagnosis of diabetes for children. Marland Kitchen   04/01/2020 10:01 AM 6.5 (H) <5.7 % of total Hgb Final    Comment:    For someone without known diabetes, a hemoglobin A1c value of 6.5% or greater indicates that they may have  diabetes and this should be confirmed with a follow-up  test. . For someone with known diabetes, a value <7% indicates  that their diabetes is well controlled and a value  greater than or equal to 7% indicates suboptimal  control. A1c targets should be individualized based on  duration of diabetes, age, comorbid conditions, and  other considerations. . Currently, no consensus exists regarding use of hemoglobin A1c for diagnosis of diabetes for children. .     CBG: Recent Labs  Lab 06/18/21 0124 06/22/21 0356  GLUCAP 109* 121*    Review of Systems:   Unable to obtain secondary to intubation  Past Medical History:  He,  has a past medical history of Adrenal nodule ([place]), Blood in the urine (05/08/2015), Borderline diabetes, Chronic kidney disease, CKD (chronic kidney disease), stage II, Diabetes mellitus without complication ([place]), Elevated PSA, Gross hematuria, H/O type B viral hepatitis  (05/08/2015), History of hepatitis B, HTN (hypertension), Hyperlipidemia with target LDL less than 100, Obesity, Pre-ulcerative calluses (03/28/2016), and Prostate cancer ([place]).   Surgical History:   Past Surgical History:  Procedure Laterality Date   ADENOIDECTOMY     COLONOSCOPY  11/18/2014   PROCTOSCOPY  04/2014   TONSILLECTOMY       Social History:   reports that he has never smoked. He has never used smokeless tobacco. He reports that he does not drink alcohol and does not use drugs.   Family History:  His family history includes Coronary artery disease in his mother; Diabetes Mellitus II in his mother; Heart disease in his father and mother; Parkinson's disease in his brother. There is no history of Prostate cancer, Kidney cancer, or Bladder Cancer.   Allergies No Known Allergies   Home Medications  Prior to Admission medications   Medication Sig Start Date End Date Taking? Authorizing Provider  atorvastatin (LIPITOR) 40 MG tablet TAKE 1 TABLET BY MOUTH EVERYDAY AT BEDTIME Patient taking differently: Take 40 mg by mouth at bedtime. 10/02/20  Yes [name], PA-C  finasteride (PROSCAR) 5 MG tablet TAKE 1 TABLET BY MOUTH EVERY DAY Patient taking differently: Take 5 mg by mouth at bedtime. 03/10/20  Yes McGowan, Larene Beach A, PA-C  lisinopril (ZESTRIL) 10 MG tablet Take 10 mg by mouth at bedtime. 03/26/21  Yes [provider]  tamsulosin (FLOMAX) 0.4 MG CAPS capsule Take 1 capsule (0.4 mg total) by mouth daily after supper. 04/01/21  Yes McGowan, Hunt Oris, PA-C     Critical care time: 55 minutes     CRITICAL CARE Performed by: Otilio Carpen Rajveer Handler   Total critical care time: 55 minutes  Critical care time was exclusive of separately billable procedures and treating other patients.  Critical care was necessary to treat or prevent imminent or life-threatening deterioration.  Critical care was time spent personally by me on the following activities: development of treatment  plan with patient and/or surrogate as well  as nursing, discussions with consultants, evaluation of patient's response to treatment, examination of patient, obtaining history from patient or surrogate, ordering and performing treatments and interventions, ordering and review of laboratory studies, ordering and review of radiographic studies, pulse oximetry and re-evaluation of patient's condition.   Otilio Carpen Hali Balgobin, PA-C [facility] See Amion for pager If no response to pager , please call [phone] until 7pm After 7:00 pm call Elink  [phone]

## 2021-06-22 NOTE — Progress Notes (Signed)
   06/22/21 0346  Clinical Encounter Type  Visited With Patient not available  Visit Type Code  Referral From Nurse  Consult/Referral To Chaplain   Chaplain responded. The patient is being treated by the medical team. There are no support person present. Patient will be transferred to [facility]. Chaplain remains available for follow-up spiritual/emotional support as needed. This note was prepared by Jeanine Luz, M.Div..  For questions please contact by phone [phone].

## 2021-06-22 NOTE — Progress Notes (Signed)
eLink Physician-Brief Progress Note Patient Name: Scott Woods DOB: [birth year] MRN: [id number]   Date of Service  06/22/2021  HPI/Events of Note  BP 80/55, MAP 63 mmHg.  eICU Interventions  Peripheral Norepinephrine gtt at 0-10 mcg ordered targeting MAP > 65 mmHg.     Intervention Category Major Interventions: Hypotension - evaluation and management  Frederik Pear 06/22/2021, 10:57 PM

## 2021-06-22 NOTE — Progress Notes (Signed)
RT NOTE:  MD verbal order complete. ETT pulled back 2cm and secured 22cm @ lip.

## 2021-06-22 NOTE — Care Management Important Message (Signed)
Important Message  Patient Details  Name: Scott Woods MRN: [id number] Date of Birth: July 04, 1951   Medicare Important Message Given:  Yes  Patient left prior to IM delivery will mail to the patient home address.    Sherron Mapp 06/22/2021, 3:42 PM

## 2021-06-22 NOTE — Progress Notes (Signed)
OT Cancellation Note  Patient Details Name: Scott Woods MRN: [id number] DOB: 11-30-1950   Cancelled Treatment:    Reason Eval/Treat Not Completed: Medical issues which prohibited therapy. Pt with noted Code early this AM, now on ventilator, new chest tube, sedated. OT will continue to follow for re-evaluation when medically appropriate.   Merri Ray Logen Fowle 06/22/2021, 8:31 AM  Jesse Sans OTR/L Acute Rehabilitation Services Pager: [phone] Office: [phone]

## 2021-06-22 NOTE — Progress Notes (Signed)
EEG Completed; Results Pending LTM to follow. 

## 2021-06-22 NOTE — Progress Notes (Signed)
  Echocardiogram Echocardiogram Transesophageal has been performed.  Merrie Roof F 06/22/2021, 12:52 PM

## 2021-06-22 NOTE — Progress Notes (Addendum)
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  He was found to have AKI on CKD, and hyperkalemia which has improved with medical management.   Pt was on the floors when he was noted to be unresponsive and had no pulses.  He had received ativan and hydralazine prior to this. Code was called and pt was in PEA arrest. Downtime was about 20 minutes.  CXR with small left sided pneumothorax.  eICU Interventions  S/p PEA arrest Encephalopathy AKI on CKD Acute respiratory failure Pneumothorax  CXR and KUB reviewed shows good placement of the ETT and OGT. There is a small left sided pneumothorax. Bedside inserting chest tube.  Follow up CBC, BMP, lactic acid, troponin. Get EEG. ABG. Protonix for DVT prophylaxis.     Intervention Category Evaluation Type: New Patient Evaluation  Elsie Lincoln 06/22/2021, 4:53 AM

## 2021-06-22 NOTE — Progress Notes (Signed)
LTM started; no initial skin breakdown was seen; [facility] monitoring; tested event button, nurse educated.

## 2021-06-22 NOTE — Progress Notes (Signed)
Asked by PCCM for TEE to evaluate for aortic dissection given aortic dilation on TTE.  Discussed with patient's son, Sigifredo Pignato.  After careful review of history and examination, the risks and benefits of transesophageal echocardiogram have been explained including risks of esophageal damage, perforation (1:10,000 risk), bleeding, pharyngeal hematoma as well as other potential complications associated with conscious sedation including aspiration, arrhythmia, respiratory failure and death. Alternatives to treatment were discussed, questions were answered. Patient's son is willing to proceed.

## 2021-06-22 NOTE — Progress Notes (Signed)
RT note-Patient taken to and from CT, remains on current ventilator settings

## 2021-06-22 NOTE — Progress Notes (Signed)
Nephrology Follow-Up Consult note   Assessment/Recommendations: Scott Woods is a/an 70 y.o. male with a past medical history significant for HTN, CAD, DM2, PVD, BPH, CKDa who present w/ fall and AKI on CKD 3a    Non-Oliguric AKI on CKD 3a: Severe AKI in May thought to be dehydration and/or obstruction now again with severe AKI and cause is not completely clear. Unsure if Crt ever returned to baseline after AKI in May.  Previous baseline was 1.5.  No signs of obstruction this admission on CT 9/29.  This could just be ATN from intermittent hypotension at home but given confusing picture we did perform serological work-up.  Most serologies pending but hepatitis , ANA, RPR, HIV were negative.  Interestingly his UPC is about 1.2 g and U ACR is only 65 mg.  This is a very large discrepancy which could argue for disease process such as amyloid or multiple myeloma causing light chain nephropathy.  Other possibility is that this is purely tubular proteinuria. -Follow-up GN labs as they return; specifically SPEP and SFLC. 24h UPEP ordered - couldn't be added on and will need to be collected wtill -Fortunately no indication for dialysis at this time but will have to monitor closely; with his BUN in the 30s doubt significant uremia as etiology of confusion --> d/w sister bedside today; no clear HCPOA est and she said should need for RRT arise it would depend on circumstance; would need to clarify -- son will likely be NOK -Continue with IV hydration for NS at 75/hr. -Continue to monitor daily Cr, Dose meds for GFR -Monitor Daily I/Os, Daily weight  -Maintain MAP>65 for optimal renal perfusion.  -Avoid nephrotoxic medications including NSAIDs and Vanc/Zosyn combo -Hold lisinopril  PEA arrest: TTE and EEGs underway. No intubated in ICU.   Anemia: Worsening normocytic anemia with Hb now 7.5, was 10.9 in 01/2021.  Iron is low will give IV iron.  Pending B12, folate. Myeloma w/u per above.    Hyperkalemia: Now  resolved  Metabolic acidosis: Bicarb 21, cont supplemental bicarb  Hypertension: Hold lisinopril. Has hydral PRN but BPs have been low   HLD: On statin   BPH: Continue home meds as tolerated     [facility] 06/22/2021 1:24 PM  ___________________________________________________________  CC: AKI  Interval History/Subjective: Overnight had PEA arrest with 24mn CPR, pneumothorax req chest tube.  Now in ICU intubated.  Sister bedside this AM.  UOP not doc yesterday.   Medications:  Current Facility-Administered Medications  Medication Dose Route Frequency Provider Last Rate Last Admin   0.9 %  sodium chloride infusion   Intravenous Continuous KJustin Mend MD   Stopped at 06/22/21 0500   acetaminophen (TYLENOL) tablet 650 mg  650 mg Oral Q4H Gleason, LOtilio Carpen PA-C       Or   acetaminophen (TYLENOL) 160 MG/5ML solution 650 mg  650 mg Per Tube Q4H Gleason, LOtilio Carpen PA-C   650 mg at 06/22/21 04239  Or   acetaminophen (TYLENOL) suppository 650 mg  650 mg Rectal Q4H Gleason, LOtilio Carpen PA-C       acetaminophen (TYLENOL) tablet 650 mg  650 mg Oral Q6H PRN Howerter, Justin B, DO       Or   acetaminophen (TYLENOL) suppository 650 mg  650 mg Rectal Q6H PRN Howerter, Justin B, DO       atorvastatin (LIPITOR) tablet 40 mg  40 mg Per Tube QHS SCandee Furbish MD  bethanechol (URECHOLINE) tablet 10 mg  10 mg Per Tube TID Candee Furbish, MD   10 mg at 06/22/21 1017   busPIRone (BUSPAR) tablet 30 mg  30 mg Per Tube Q8H Gleason, Otilio Carpen, PA-C       Or   busPIRone (BUSPAR) tablet 30 mg  30 mg Per Tube Q8H Gleason, Otilio Carpen, PA-C   30 mg at 06/22/21 0856   chlorhexidine gluconate (MEDLINE KIT) (PERIDEX) 0.12 % solution 15 mL  15 mL Mouth Rinse BID Gleason, Otilio Carpen, PA-C   15 mL at 06/22/21 0800   Chlorhexidine Gluconate Cloth 2 % PADS 6 each  6 each Topical Daily Mannam, Praveen, MD   6 each at 06/22/21 1000   docusate (COLACE) 50 MG/5ML liquid 100 mg  100  mg Per Tube BID Gleason, Otilio Carpen, PA-C   100 mg at 06/22/21 0900   fentaNYL (SUBLIMAZE) bolus via infusion 25-100 mcg  25-100 mcg Intravenous Q15 min PRN Gleason, Otilio Carpen, PA-C       fentaNYL 2579mg in NS 2532m(1017mml) infusion-PREMIX  25-200 mcg/hr Intravenous Continuous Gleason, LauOtilio CarpenA-C   Stopped at 06/22/21 0829   heparin injection 5,000 Units  5,000 Units Subcutaneous Q8H SmiCandee FurbishD       insulin aspart (novoLOG) injection 0-9 Units  0-9 Units Subcutaneous Q4H SmiCandee FurbishD       levETIRAcetam (KEPPRA) IVPB 1000 mg/100 mL premix  1,000 mg Intravenous Once ReeAyesha Rumpftephanie M, PA-C       lidocaine-EPINEPHrine (XYLOCAINE W/EPI) 1 %-1:100000 (with pres) injection 30 mL  30 mL Other Once Mannam, Praveen, MD       MEDLINE mouth rinse  15 mL Mouth Rinse 10 times per day Gleason, LauOtilio CarpenA-C   15 mL at 06/22/21 1018   midazolam (VERSED) 2 MG/2ML injection            midazolam (VERSED) injection 2 mg  2 mg Intravenous Once PRN ReeNevada Crane PA-C       ondansetron (ZOCommunity Hospital Eastnjection 4 mg  4 mg Intravenous Q6H PRN Gleason, LauOtilio CarpenA-C       pantoprazole (PROTONIX) injection 40 mg  40 mg Intravenous Daily Gleason, LauOtilio CarpenA-C   40 mg at 06/22/21 0900   polyethylene glycol (MIRALAX / GLYCOLAX) packet 17 g  17 g Per Tube Daily Gleason, LauOtilio CarpenA-C   17 g at 06/22/21 0900   sodium bicarbonate tablet 1,300 mg  1,300 mg Per Tube BID SmiCandee FurbishD   1,300 mg at 06/22/21 1017      Review of Systems: 10 systems reviewed and negative except per interval history/subjective  Physical Exam: Vitals:   06/22/21 1055 06/22/21 1110  BP: 127/73 110/69  Pulse: 85 75  Resp: 18 18  Temp: 98.8 F (37.1 C)   SpO2:  100%   Total I/O In: 968.7 [I.V.:551.9; Blood:310; NG/GT:80; IV Piggyback:26.8] Out: 340 [Urine:325; Emesis/NG output:10; Chest Tube:5]  Intake/Output Summary (Last 24 hours) at 06/22/2021 1324 Last data filed at 06/22/2021 1055 Gross per 24 hour  Intake  2130.3 ml  Output 340 ml  Net 1790.3 ml    Constitutional: intubated  ENMT: ETT in place CV: normal rate, no edema Respiratory: clear BL Gastrointestinal: soft, non-tender, no palpable masses or hernias Skin: no visible lesions or rashes GU: condom catheter with clear yellow urine Neuro: sedated on vent   Test Results I personally reviewed new and old clinical labs and radiology tests  Lab Results  Component Value Date   NA 141 06/22/2021   K 4.8 06/22/2021   CL 112 (H) 06/22/2021   CO2 21 (L) 06/22/2021   BUN 38 (H) 06/22/2021   CREATININE 5.22 (H) 06/22/2021   CALCIUM 8.5 (L) 06/22/2021   ALBUMIN 2.5 (L) 06/22/2021   PHOS 4.3 06/22/2021

## 2021-06-22 NOTE — Progress Notes (Addendum)
Throughout  shift pt has been restless and pulling on equipment , removing leads and taking off b.p. cuff, agitated stating he wants to go home and to call [facility] Gala , attempts made but he stated it was the wrong number , confused stating he had to go to the bank and get some money , multiple times attempted to get O.o.b, bed alarm has been on , hygienic needs met twice as pt pulled off condom cath and premafit , , eventually placed in a diaper to prevent frequent wetting. Allowed to rest in bed after  ativan was given , slept a short while  then aroused ,mittens placed to prevent pt from pulling on equipment , he removed one subsequently I removed the other, He was in bed resting  when v/s were done with elevated Bp . Bp rechecked later ,Dr was subsequently informed and meds ordered. Called into room by tech , re pt who was not responding as he normally does, tried to arouse verbally and with chest rubs, no response ,noted to be  unresponsive , with grunting sounds, no pulse felt CPR commenced @ 0335  and code called . All staff present  , Rapid team responded , kindly see documented CPR notes ,Transferred to [place].

## 2021-06-22 NOTE — Progress Notes (Signed)
  Echocardiogram 2D Echocardiogram has been performed.  Merrie Roof F 06/22/2021, 10:26 AM

## 2021-06-22 NOTE — Procedures (Addendum)
ADDENDED REPORT   Patient Name: Scott Woods  MRN: [id number]  Epilepsy Attending: Lora Havens  Referring Physician/Provider: Elease Etienne, PA Date: 06/22/2021 Duration: 23.03 mins  Patient history: 70 year old male status post cardiac arrest.  EEG to evaluate for seizures.  Level of alertness: Comatose  AEDs during EEG study: None  Technical aspects: This EEG study was done with scalp electrodes positioned according to the 10-20 International system of electrode placement. Electrical activity was acquired at a sampling rate of 500Hz  and reviewed with a high frequency filter of 70Hz  and a low frequency filter of 1Hz . EEG data were recorded continuously and digitally stored.   Description: EEG showed continuous generalized background attenuation which was reactive to tactile stimulation.  Patient was also noted to have intermittent bilateral (right more than left) upper extremity jerking, more prominent when stimulated.  Concomitant EEG before, during and after the event did not show any EEG changes due to seizure.  Hyperventilation and photic stimulation were not performed.     ABNORMALITY -Background attenuation, generalized  IMPRESSION: This study is suggestive of profound diffuse encephalopathy.  Patient was noted to have right more than left upper extremity jerking without concomitant EEG change.  This was most likely not an epileptic event.  Stimulation induced myoclonus is in the differential.  [name], PA was initially notified.  Dr. Etta Quill was notified about the addended report.  Guthrie Lemme [name]

## 2021-06-22 NOTE — Procedures (Signed)
Insertion of Chest Tube Procedure Note  Scott Woods  [id number]  13-Oct-1950  Date:06/22/21  Time:6:16 AM    Provider Performing: Marshell Garfinkel   Procedure: Pleural Catheter Insertion w/ Imaging Guidance [phone])  Indication(s) Pneumothorax  Consent Risks of the procedure as well as the alternatives and risks of each were explained to the patient and/or caregiver.  Consent for the procedure was obtained and is signed in the bedside chart  Anesthesia Topical only with 1% lidocaine    Time Out Verified patient identification, verified procedure, site/side was marked, verified correct patient position, special equipment/implants available, medications/allergies/relevant history reviewed, required imaging and test results available.   Sterile Technique Maximal sterile technique including full sterile barrier drape, hand hygiene, sterile gown, sterile gloves, mask, hair covering, sterile ultrasound probe cover (if used).   Procedure Description Ultrasound used to identify appropriate pleural anatomy for placement and overlying skin marked. Area of placement cleaned and draped in sterile fashion.  A pigtail pleural catheter was placed into the left pleural space using Seldinger technique. Appropriate return of air was obtained.  The tube was connected to atrium and placed on -20 cm H2O wall suction.   Complications/Tolerance None; patient tolerated the procedure well. Chest X-ray is ordered to verify placement.   EBL Minimal  Specimen(s) none  Marshell Garfinkel MD [facility] See Amion for pager  If no response to pager , please call [phone] until 7pm After 7:00 pm call Elink  [phone] 06/22/2021, 6:17 AM

## 2021-06-22 NOTE — Progress Notes (Signed)
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 PMHx significant for HTN, HLD, T2DM, CKD stage IV, history of Hepatitis B, adrenal nodule, prostate CA.  On admission, patient denied any preceding symptoms prior to syncopal episode. CT Head was negative. Labs demonstrated Cr 6.2 (baseline 2.7) and K 6.3.  Hyperkalemia improved with Lokelma and insulin.  He was admitted to the hospitalist service.  Overnight 10/4, patient became agitated and hypertensive and was given Ativan and hydralazine.  A short time later he suffered a PEA arrest. Code Blue was called and ROSC obtained after 20 minutes CPR. Patient was intubated with post-intubation CXR showing L pneumothorax. Chest tube was placed by CCM.  Transferred to CVICU for further care.  Pertinent Medical History:   Past Medical History:  Diagnosis Date   Adrenal nodule ([place])    Blood in the urine 05/08/2015   Borderline diabetes    Chronic kidney disease    CKD (chronic kidney disease), stage II    Diabetes mellitus without complication (HCC)    Elevated PSA    Gross hematuria    H/O type B viral hepatitis 05/08/2015   When he was a child    History of hepatitis B    HTN (hypertension)    Hyperlipidemia with target LDL less than 100    Obesity    Pre-ulcerative calluses 03/28/2016   great toe    Prostate cancer ([place])    Significant Hospital Events: Including procedures, antibiotic start and stop dates in addition to other pertinent events   9/29 admitted to hospitalist service 10/4 PEA arrest, intubation, transfer to [facility], left chest tube placed for post-code PTX.  Interim History / Subjective:  Code Blue overnight on floor, transferred to [place] Post-intubation  CXR with L PTX S/p CT placement Improving neuro exam LTM EEG placed  Objective:  Blood pressure 100/76, pulse (!) 109, temperature 98 F (36.7 C), temperature source Oral, resp. rate 18, height 5\' 8"  (1.727 m), weight 70.8 kg, SpO2 100 %.    Vent Mode: PRVC FiO2 (%):  [50 %-100 %] 50 % Set Rate:  [18 bmp] 18 bmp Vt Set:  [550 mL] 550 mL PEEP:  [5 cmH20] 5 cmH20 Plateau Pressure:  [14 cmH20-18 cmH20] 18 cmH20   Intake/Output Summary (Last 24 hours) at 06/22/2021 0853 Last data filed at 06/22/2021 8099 Gross per 24 hour  Intake 2109.22 ml  Output --  Net 2109.22 ml    Filed Weights   06/19/21 0425 06/21/21 0302 06/22/21 0436  Weight: 69 kg 71.4 kg 70.8 kg   Physical Examination: General: Acutely ill-appearing middle-aged man in NAD. HEENT: [place]/AT, anicteric sclera, pupils equal (77mm), sluggishly reactive, L eye slightly disconjugate, dry mucous membranes. Neuro: Lethargic. Responds to noxious stimuli. and Withdraws to pain in all 4 extremities. Not following commands. Moves all 4 extremities spontaneously. +Cough and +Gag  CV: RRR, no m/g/r. PULM: Breathing even and unlabored on vent (PEEP 5, FiO2 50%). Lung fields CTAB, slightly diminished at bilateral bases. GI: Soft, nontender, nondistended. Normoactive bowel sounds. Extremities: No LE edema noted. Skin: Warm/dry, no rashes.  Resolved Hospital Problem List:     Assessment & Plan:   Witnessed in-hospital PEA arrest, unclear etiology 20 minutes ACLS before  ROSC, currently unclear etiology. K WNL prior to code. PE considered, but unlikely as Echo 10/2 without e/o R heart strain. - Supportive care - F/u repeat labs - F/u repeat Echo - Target normothermia  Acute hypoxic respiratory failure 2/2 PEA arrest - Continue full vent support (4-8cc/kg IBW) - Wean FiO2 for O2 sat > 90% - Daily WUA/SBT - VAP bundle - Pulmonary hygiene - RT/bronchodilator protocol - PAD protocol for sedation: Fentanyl for goal RASS 0 to -1,  minimize as able so as not to confound neuro exam  Acute encephalopathy post-PEA arrest - LTM EEG - Neuro consult as appropriate - Neuroprotective measures: HOB > 30 degrees, normoglycemia, normothermia, electrolytes WNL  Anemia Hemoglobin 6.5 overnight, only downtrending since admission, no overt signs of bleeding. Minimal bloody ETT secretions on suctioning. - Trend CBC - Keep active T&S - Transfuse for Hgb < 7.0 or active bleeding  Acute kidney injury on chronic CKD stage IV Hyperkalemia, improving - Nephro following, appreciate assistance - Ongoing glomerulonephritis workup - Trend BMP - Replete electrolytes as indicated - Monitor I&Os - Avoid nephrotoxic agents as able - Ensure adequate renal perfusion  Hypertension - Hold home antihypertensives in the setting of hypotension post-code  Best Practice: (right click and "Reselect all SmartList Selections" daily)   Diet/type: NPO DVT prophylaxis: prophylactic heparin  GI prophylaxis: PPI Lines: N/A Foley:  N/A Code Status:  full code Last date of multidisciplinary goals of care discussion [Pending]  Critical care time: 35 minutes   Lestine Mount, PA-C [facility] 06/22/21 9:01 AM  Please see [email or web address] for pager details.  From 7A-7P if no response, please call [phone] After hours, please call ELink [phone]

## 2021-06-22 NOTE — CV Procedure (Signed)
    TRANSESOPHAGEAL ECHOCARDIOGRAM   NAME:  Scott CHAMPLAIN   MRN: [id number] DOB:  1951-04-27   ADMIT DATE: 06/13/2021  INDICATIONS: Aortic aneurysm on TTE, cardiac arrest  PROCEDURE:   Informed consent was obtained prior to the procedure. The risks, benefits and alternatives for the procedure were discussed and the patient's son comprehended these risks.  Risks include, but are not limited to, cough, sore throat, vomiting, nausea, somnolence, esophageal and stomach trauma or perforation, bleeding, low blood pressure, aspiration, pneumonia, infection, trauma to the teeth and death.    The transesophageal probe was inserted in the esophagus and stomach without difficulty and multiple views were obtained.    COMPLICATIONS:    There were no immediate complications.  FINDINGS:  No aortic dissection seen.  Oswaldo Milian MD [place] [facility]  [address], [place], [place] [address] [phone]   12:38 PM

## 2021-06-22 NOTE — Code Documentation (Signed)
  Patient Name: FELIZ LINCOLN   MRN: [id number]   Date of Birth/ Sex: 05/27/1951 , male      Admission Date: 05/30/2021  Attending Provider: Richarda Osmond, MD  Primary Diagnosis: Acute renal failure superimposed on stage 4 chronic kidney disease ([place])    Indication: Pt was in his usual state of health until this AM, when he was noted to be in PEA arrest. Code blue was subsequently called. At the time of arrival on scene, ACLS protocol was underway.   Technical Description:  - CPR performance duration:  20 minutes  - Was defibrillation or cardioversion used? No   - Was external pacer placed? Yes  - Was patient intubated pre/post CPR? Yes   Medications Administered: Y = Yes; Blank = No Amiodarone    Atropine  Y  Calcium    Epinephrine  Y  Lidocaine    Magnesium    Norepinephrine    Phenylephrine    Sodium bicarbonate  Y  Vasopressin     Post CPR evaluation:  - Final Status - Was patient successfully resuscitated ? Yes - What is current rhythm? Sinus tachycardia - What is current hemodynamic status? Stable  Miscellaneous Information:  - Labs sent, including: CBC, CMP, lactic acid, trop, mag, CXR  - Primary team notified?  Yes  - Family Notified? Yes  - Additional notes/ transfer status: 2H03     Gaylan Gerold, DO  06/22/2021, 4:22 AM

## 2021-06-23 DIAGNOSIS — I469 Cardiac arrest, cause unspecified: Secondary | ICD-10-CM

## 2021-06-23 LAB — TYPE AND SCREEN
ABO/RH(D): O POS
Antibody Screen: NEGATIVE
Unit division: 0

## 2021-06-23 LAB — CBC WITH DIFFERENTIAL/PLATELET
Abs Immature Granulocytes: 0.03 10*3/uL (ref 0.00–0.07)
Basophils Absolute: 0 10*3/uL (ref 0.0–0.1)
Basophils Relative: 0 %
Eosinophils Absolute: 0 10*3/uL (ref 0.0–0.5)
Eosinophils Relative: 0 %
HCT: 23.9 % — ABNORMAL LOW (ref 39.0–52.0)
Hemoglobin: 8.3 g/dL — ABNORMAL LOW (ref 13.0–17.0)
Immature Granulocytes: 0 %
Lymphocytes Relative: 12 %
Lymphs Abs: 0.8 10*3/uL (ref 0.7–4.0)
MCH: 32 pg (ref 26.0–34.0)
MCHC: 34.7 g/dL (ref 30.0–36.0)
MCV: 92.3 fL (ref 80.0–100.0)
Monocytes Absolute: 0.5 10*3/uL (ref 0.1–1.0)
Monocytes Relative: 7 %
Neutro Abs: 5.4 10*3/uL (ref 1.7–7.7)
Neutrophils Relative %: 81 %
Platelets: 157 10*3/uL (ref 150–400)
RBC: 2.59 MIL/uL — ABNORMAL LOW (ref 4.22–5.81)
RDW: 13.4 % (ref 11.5–15.5)
WBC: 6.8 10*3/uL (ref 4.0–10.5)
nRBC: 0 % (ref 0.0–0.2)

## 2021-06-23 LAB — RENAL FUNCTION PANEL
Albumin: 2.3 g/dL — ABNORMAL LOW (ref 3.5–5.0)
Anion gap: 7 (ref 5–15)
BUN: 42 mg/dL — ABNORMAL HIGH (ref 8–23)
CO2: 19 mmol/L — ABNORMAL LOW (ref 22–32)
Calcium: 8.1 mg/dL — ABNORMAL LOW (ref 8.9–10.3)
Chloride: 113 mmol/L — ABNORMAL HIGH (ref 98–111)
Creatinine, Ser: 5.42 mg/dL — ABNORMAL HIGH (ref 0.61–1.24)
GFR, Estimated: 11 mL/min — ABNORMAL LOW (ref >60)
Glucose, Bld: 134 mg/dL — ABNORMAL HIGH (ref 70–99)
Phosphorus: 4.7 mg/dL — ABNORMAL HIGH (ref 2.5–4.6)
Potassium: 4.2 mmol/L (ref 3.5–5.1)
Sodium: 139 mmol/L (ref 135–145)

## 2021-06-23 LAB — KAPPA/LAMBDA LIGHT CHAINS
Kappa free light chain: 119.7 mg/L — ABNORMAL HIGH (ref 3.3–19.4)
Kappa, lambda light chain ratio: 1.38 (ref 0.26–1.65)
Lambda free light chains: 86.6 mg/L — ABNORMAL HIGH (ref 5.7–26.3)

## 2021-06-23 LAB — POCT I-STAT 7, (LYTES, BLD GAS, ICA,H+H)
Acid-base deficit: 4 mmol/L — ABNORMAL HIGH (ref 0.0–2.0)
Bicarbonate: 19.8 mmol/L — ABNORMAL LOW (ref 20.0–28.0)
Calcium, Ion: 1.18 mmol/L (ref 1.15–1.40)
HCT: 23 % — ABNORMAL LOW (ref 39.0–52.0)
Hemoglobin: 7.8 g/dL — ABNORMAL LOW (ref 13.0–17.0)
O2 Saturation: 99 %
Patient temperature: 98
Potassium: 4.4 mmol/L (ref 3.5–5.1)
Sodium: 143 mmol/L (ref 135–145)
TCO2: 21 mmol/L — ABNORMAL LOW (ref 22–32)
pCO2 arterial: 30.4 mmHg — ABNORMAL LOW (ref 32.0–48.0)
pH, Arterial: 7.42 (ref 7.350–7.450)
pO2, Arterial: 149 mmHg — ABNORMAL HIGH (ref 83.0–108.0)

## 2021-06-23 LAB — GLUCOSE, CAPILLARY
Glucose-Capillary: 107 mg/dL — ABNORMAL HIGH (ref 70–99)
Glucose-Capillary: 112 mg/dL — ABNORMAL HIGH (ref 70–99)
Glucose-Capillary: 125 mg/dL — ABNORMAL HIGH (ref 70–99)
Glucose-Capillary: 140 mg/dL — ABNORMAL HIGH (ref 70–99)
Glucose-Capillary: 141 mg/dL — ABNORMAL HIGH (ref 70–99)
Glucose-Capillary: 145 mg/dL — ABNORMAL HIGH (ref 70–99)
Glucose-Capillary: 168 mg/dL — ABNORMAL HIGH (ref 70–99)

## 2021-06-23 LAB — BPAM RBC
Blood Product Expiration Date: 202210302359
ISSUE DATE / TIME: 202210040856
Unit Type and Rh: 5100

## 2021-06-23 LAB — PHOSPHORUS: Phosphorus: 4.7 mg/dL — ABNORMAL HIGH (ref 2.5–4.6)

## 2021-06-23 LAB — MAGNESIUM
Magnesium: 2.3 mg/dL (ref 1.7–2.4)
Magnesium: 2.3 mg/dL (ref 1.7–2.4)

## 2021-06-23 LAB — GLOMERULAR BASEMENT MEMBRANE ANTIBODIES: GBM Ab: <0.2 units (ref 0.0–0.9)

## 2021-06-23 MED ORDER — VITAL 1.5 CAL PO LIQD
1000.0000 mL | ORAL | Status: DC
  Administered 2021-06-23 – 2021-07-02 (×7): 1000 mL

## 2021-06-23 MED ORDER — PROSOURCE TF PO LIQD
45.0000 mL | Freq: Every day | ORAL | Status: DC
  Administered 2021-06-23 – 2021-06-29 (×7): 45 mL
  Filled 2021-06-23 (×7): qty 45

## 2021-06-23 NOTE — Progress Notes (Signed)
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 PMHx significant for HTN, HLD, T2DM, CKD stage IV, history of Hepatitis B, adrenal nodule, prostate CA.  On admission, patient denied any preceding symptoms prior to syncopal episode. CT Head was negative. Labs demonstrated Cr 6.2 (baseline 2.7) and K 6.3.  Hyperkalemia improved with Lokelma and insulin.  He was admitted to the hospitalist service.  Overnight 10/4, patient became agitated and hypertensive and was given Ativan and hydralazine.  A short time later he suffered a PEA arrest. Code Blue was called and ROSC obtained after 20 minutes CPR. Patient was intubated with post-intubation CXR showing L pneumothorax. Chest tube was placed by CCM.  Transferred to CVICU for further care.  Pertinent Medical History:   Past Medical History:  Diagnosis Date   Adrenal nodule ([place])    Blood in the urine 05/08/2015   Borderline diabetes    Chronic kidney disease    CKD (chronic kidney disease), stage II    Diabetes mellitus without complication (HCC)    Elevated PSA    Gross hematuria    H/O type B viral hepatitis 05/08/2015   When he was a child    History of hepatitis B    HTN (hypertension)    Hyperlipidemia with target LDL less than 100    Obesity    Pre-ulcerative calluses 03/28/2016   great toe    Prostate cancer ([place])    Significant Hospital Events: Including procedures, antibiotic start and stop dates in addition to other pertinent events   9/29 admitted to hospitalist service 10/4 PEA arrest, intubation, transfer to [place], left chest tube placed for post-code PTX. LTM EEG, c/f myoclonus. CT Head NAICA. Bedside TEE negative for dissection. 10/5   Interim History  / Subjective:  No significant events overnight Hypotension this AM with SBPs 80s-90s, NE ordered (not started) UOP dropped off, 61mL/24H - Nephro following Neuro exam stable to slightly improved LTM EEG in place Likely needs more time  Objective:  Blood pressure 126/73, pulse 77, temperature 98.2 F (36.8 C), resp. rate 18, height 5\' 8"  (1.727 m), weight 74 kg, SpO2 100 %.    Vent Mode: PRVC FiO2 (%):  [40 %-50 %] 40 % Set Rate:  [18 bmp] 18 bmp Vt Set:  [550 mL] 550 mL PEEP:  [5 cmH20] 5 cmH20 Plateau Pressure:  [17 cmH20-21 cmH20] 18 cmH20   Intake/Output Summary (Last 24 hours) at 06/23/2021 0748 Last data filed at 06/23/2021 0400 Gross per 24 hour  Intake 480.22 ml  Output 895 ml  Net -414.78 ml    Filed Weights   06/21/21 0302 06/22/21 0436 06/23/21 0500  Weight: 71.4 kg 70.8 kg 74 kg   Physical Examination: General: Acutely ill-appearing middle-aged man in NAD. HEENT: [place]/AT, anicteric sclera, pupils equal and round (64mm), sluggishly reactive, dry mucous membranes. Neuro:  Decreased responsiveness.  Responds to noxious stimuli. and Withdraws to pain in all 4 extremities. Not following commands. +Cough and +Gag  CV: RRR, no m/g/r. PULM: Breathing even and unlabored on vent (PEEP 5, FiO2 40%). Lung fields diminished, no significant rhonchi/rales. GI: Soft, nontender, nondistended. Normoactive bowel sounds. Extremities: Trace LE edema noted. Skin: Warm/dry, no rashes.  Resolved Hospital Problem List:  Hyperkalemia  Assessment & Plan:   Witnessed in-hospital PEA arrest, unclear etiology 20 minutes ACLS before ROSC, currently unclear etiology. K WNL prior to code. PE considered, but unlikely as Echo 10/2 without e/o R heart strain. - Supportive care - S/p TEE 10/4 without evidence of dissection - Target normothermia - Ongoing workup re: etiology of arrest - Likely needs more time from a neurologic recovery/prognosis standpoint  Acute hypoxic respiratory failure  2/2 PEA arrest - Continue full vent support (4-8cc/kg IBW) - Wean FiO2 for O2 sat > 90% - Daily WUA/SBT - VAP bundle - Pulmonary hygiene - RT/bronchodilator protocol - PAD protocol for sedation: Fentanyl for goal RASS 0 to -1, off 10/5AM, minimize as able to allow for accurate neurologic assessment - F/u ABG 10/5AM  Acute encephalopathy post-PEA arrest, c/f anoxic brain injury - LTM EEG - Keppra 1000mg  x 1 given 10/4 for presumed myoclonus - CT Head 10/4 NAICA - Neuro consult as appropriate - Neuroprotective measures: HOB > 30 degrees, normoglycemia, normothermia, electrolytes WNL - Minimize sedation (off 10/5AM)  Anemia Hemoglobin 6.5 overnight, only downtrending since admission, no overt signs of bleeding. Minimal bloody ETT secretions on suctioning. - Trend CBC - Keep active T&S - Transfuse for Hgb < 7.0 or active bleeding - Last transfusion: 1U PRBCs 10/4  Acute kidney injury on chronic CKD stage IV Oliguria - Nephrology following, appreciate recs - Ongoing GN workup - Trend BMP - Replete electrolytes as indicated - Monitor I&Os - F/u urine studies/GN workup - Avoid nephrotoxic agents as able - Ensure adequate renal perfusion  Hypertension - Hold home antihypertensives in the setting of post-code hypotension  Best Practice: (right click and "Reselect all SmartList Selections" daily)   Diet/type: NPO DVT prophylaxis: prophylactic heparin  GI prophylaxis: PPI Lines: N/A Foley:  N/A Code Status:  full code Last date of multidisciplinary goals of care discussion [10/4 Son would like to be primary decisionmaker]  Critical care time: 82 minutes   Lestine Mount, PA-C [facility] 06/23/21 7:48 AM  Please see [email or web address] for pager details.  From 7A-7P if no response, please call [phone] After hours, please call ELink [phone]

## 2021-06-23 NOTE — Consult Note (Addendum)
Neurology Consultation Reason for Consult: cardiac arrest Referring Physician: Dr Ina Homes  CC: cardiac arrest  History is obtained from: Chart review as patient is comatose  HPI: Scott Woods is a 70 y.o. male with past medical history of stage IV chronic kidney disease, hypertension, hyperlipidemia, chronic anemia with baseline hemoglobin of 8-11 who was admitted to [facility] on 05/25/2021 with acute renal failure.  Per review of H&P, patient had a syncopal episode while he was at a volleyball game.  He was sitting in the referre chair and suddenly woke up on the volleyball court itself.  Denied any preceding symptoms like dizziness, lightheadedness, chest pain, shortness of breath, palpitations, etc.  After the syncopal episode patient was noted to have an episode of urinary incontinence but no seizure-like activity was noted.  CT head without contrast was obtained which did not show any acute abnormality.  Patient was being treated for AKI.  On 06/22/2021, at around 3:35 AM, nursing tech noted that patient was not responding, no pulse was felt, CPR was started and CODE BLUE was called.  ROSC was obtained after about 20 minutes of CPR.  Post intubation chest x-ray showed left pneumothorax for which chest tube was placed.  Post cardiac arrest CT head without contrast did not show any acute abnormality.  Patient was noted to have intermittent right more than left upper extremity twitching.  Therefore long-term EEG was started and and neurology was consulted for further management  ROS: Unable to obtain due to altered mental status.   Past Medical History:  Diagnosis Date   Adrenal nodule ([place])    Blood in the urine 05/08/2015   Borderline diabetes    Chronic kidney disease    CKD (chronic kidney disease), stage II    Diabetes mellitus without complication (HCC)    Elevated PSA    Gross hematuria    H/O type B viral hepatitis 05/08/2015   When he was a child    History of  hepatitis B    HTN (hypertension)    Hyperlipidemia with target LDL less than 100    Obesity    Pre-ulcerative calluses 03/28/2016   great toe    Prostate cancer ([place])     Family History  Problem Relation Age of Onset   Parkinson's disease Brother    Heart disease Mother    Diabetes Mellitus II Mother    Coronary artery disease Mother    Heart disease Father    Prostate cancer Neg Hx    Kidney cancer Neg Hx    Bladder Cancer Neg Hx     Social History:  reports that he has never smoked. He has never used smokeless tobacco. He reports that he does not drink alcohol and does not use drugs.  Medications Prior to Admission  Medication Sig Dispense Refill Last Dose   atorvastatin (LIPITOR) 40 MG tablet TAKE 1 TABLET BY MOUTH EVERYDAY AT BEDTIME (Patient taking differently: Take 40 mg by mouth at bedtime.) 90 tablet 3 06/16/2021   finasteride (PROSCAR) 5 MG tablet TAKE 1 TABLET BY MOUTH EVERY DAY (Patient taking differently: Take 5 mg by mouth at bedtime.) 90 tablet 3 06/16/2021   lisinopril (ZESTRIL) 10 MG tablet Take 10 mg by mouth at bedtime.   06/16/2021   tamsulosin (FLOMAX) 0.4 MG CAPS capsule Take 1 capsule (0.4 mg total) by mouth daily after supper. 90 capsule 3 06/16/2021      Exam: Current vital signs: BP (!) 142/83 (BP Location: Left  Arm)   Pulse 83   Temp 98.8 F (37.1 C)   Resp 14   Ht 5\' 8"  (1.727 m)   Wt 74 kg   SpO2 99%   BMI 24.81 kg/m  Vital signs in last 24 hours: Temp:  [97.9 F (36.6 C)-99.1 F (37.3 C)] 98.8 F (37.1 C) (10/05 1200) Pulse Rate:  [77-90] 83 (10/05 1137) Resp:  [14-24] 14 (10/05 1200) BP: (78-192)/(55-114) 142/83 (10/05 1200) SpO2:  [98 %-100 %] 99 % (10/05 1200) FiO2 (%):  [30 %-40 %] 30 % (10/05 1137) Weight:  [74 kg] 74 kg (10/05 0500)   Physical Exam  Constitutional: Laying in bed, not in apparent distress Psych: Unable to assess due to comatose state Eyes: No scleral injection HENT: No OP obstrucion Head: Normocephalic.   Cardiovascular: Normal rate and regular rhythm.  Respiratory: Intubated, CTAB  GI: Soft.  No distension.  Skin: Warm Neuro: Comatose, does not open eyes To stimuli, does not follow commands, PERRLA, corneal reflex intact, gag reflex intact, withdraws to noxious stimuli briskly upper extremity, has intermittent right upper extremity tremor which is more prominent with stimulation   I have reviewed labs in epic and the results pertinent to this consultation are: CBC:  Recent Labs  Lab 06/07/2021 2100 06/18/21 0425 06/22/21 1845 06/23/21 0123 06/23/21 0837  WBC 8.8   < > 9.2 6.8  --   NEUTROABS 7.0  --   --  5.4  --   HGB 8.2*   < > 9.3* 8.3* 7.8*  HCT 25.6*   < > 27.8* 23.9* 23.0*  MCV 100.0   < > 93.0 92.3  --   PLT 192   < > 184 157  --    < > = values in this interval not displayed.    Basic Metabolic Panel:  Lab Results  Component Value Date   NA 143 06/23/2021   K 4.4 06/23/2021   CO2 19 (L) 06/23/2021   GLUCOSE 134 (H) 06/23/2021   BUN 42 (H) 06/23/2021   CREATININE 5.42 (H) 06/23/2021   CALCIUM 8.1 (L) 06/23/2021   GFRNONAA 11 (L) 06/23/2021   GFRAA 56 (L) 10/02/2020   Lipid Panel:  Lab Results  Component Value Date   LDLCALC 63 02/11/2021   HgbA1c:  Lab Results  Component Value Date   HGBA1C 5.5 06/03/2021   Urine Drug Screen: No results found for: LABOPIA, COCAINSCRNUR, LABBENZ, AMPHETMU, THCU, LABBARB  Alcohol Level No results found for: ETH   I have reviewed the images obtained:  CT head without contrast 05/20/2021: No acute intracranial process.  CT head without contrast 06/22/2021: No acute intracranial process.  ASSESSMENT/PLAN: 70 year old male status post in-hospital cardiac arrest on 06/22/2021 at 3:35 AM, 20 minutes to ROSC.  Cardiac arrest Myoclonus AKI with CKD Hypoalbuminemia Chronic anemia (stable) Iron deficiency Vitamin B12 deficiency -LTM EEG showed generalized periodic discharges with triphasic morphology at 1.5 Hz. -Right more  than left upper extremity stimulation to his myoclonus could be secondary to renal failure.  Less likely post anoxic myoclonus because it is typically not this only after cardiac arrest  Recommendations: -No definite evidence of seizures.  Therefore we will hold off starting any AEDs.  However, if the EEG shows any evidence of seizures can start Keppra 250 mg twice daily -Continue LTM EEG to look for intermittent seizures -We will obtain MRI brain without contrast at 72 hours (Friday) -Patient had in-hospital cardiac arrest. He is still comatose but has cranial nerve reflexes and withdraws  to noxious stimuli in all extremities.  We will need about 72 hours off sedation for neuro prognostication. -Minimize sedation, continue seizure precautions -As needed IV Ativan 2 mg for clinical seizure-like activity -Management of rest of comorbidities per primary team  Thank you for allowing [place] to participate in the care of this patient. If you have any further questions, please contact  me or neurohospitalist.   Scott Woods Epile[facility] neuro[facility]ist

## 2021-06-23 NOTE — Progress Notes (Signed)
LTM maintenance completed; no skin breakdown under Fp1 and Fp2; tested event button.

## 2021-06-23 NOTE — Progress Notes (Signed)
Nephrology Follow-Up Consult note   Assessment/Recommendations: Scott Woods is a/an 70 y.o. male with a past medical history significant for HTN, CAD, DM2, PVD, BPH, CKDa who present w/ fall and AKI on CKD 3a    Non-Oliguric AKI on CKD 3a: Baseline Cr 1.5 as of early 2022 then with severe AKI in May thought to be dehydration and/or obstruction now again with severe AKI (though unknown if had returned to baseline renal function after AKI) and cause is not completely clear.  Could all be hypotension related tubular injury but has moderate proteinuria with discrepancy between UACR and UP/C with concern for MM or amyloid.   -Follow-up GN labs as they return; specifically SPEP and SFLC. 24h UPEP ordered - couldn't be added on and will need to be collected still.  ANA, RPR, HIV, hepatitis all ok. -Fortunately no indication for dialysis at this time but will have to monitor closely; with his BUN in the 40s no significant uremia as etiology of confusion  -Continue with MIVF with NS at 75/hr but d/c if s/s vol OL develop -Continue to monitor daily Cr, Dose meds for GFR -Monitor Daily I/Os, Daily weight  -Maintain MAP>65 for optimal renal perfusion.  -Avoid nephrotoxic medications including NSAIDs and Vanc/Zosyn combo -Hold lisinopril  PEA arrest: TEE no dissection, EEG no seizure; ongoing eval for etiology.   Anemia: Worsening normocytic anemia with Hb now 7-8s, was 10.9 in 01/2021.  Iron is low will giving IV iron.  Myeloma w/u per above.    Metabolic acidosis: Bicarb 19, cont supplemental bicarb  Hypertension: Hold lisinopril. Has hydral PRN but BPs have been low intermittently   HLD: On statin   BPH: Continue home meds as tolerated  Will follow - call with concerns.    [facility] 06/23/2021 10:26 AM  ___________________________________________________________  CC: AKI  Interval History/Subjective:  No major events through yesterday.  Remains  intubated, encephalopathic and not requiring sedation on vent. [place] to noxious stim only.  UOP 667mL yesterday. Labs this AM BUN 42, Cr 5.4.   Medications:  Current Facility-Administered Medications  Medication Dose Route Frequency Provider Last Rate Last Admin   0.9 %  sodium chloride infusion   Intravenous Continuous Justin Mend, MD   Stopped at 06/22/21 0500   0.9 %  sodium chloride infusion  250 mL Intravenous Continuous Ogan, Kerry Kass, MD       acetaminophen (TYLENOL) tablet 650 mg  650 mg Oral Q4H Gleason, Otilio Carpen, PA-C       Or   acetaminophen (TYLENOL) 160 MG/5ML solution 650 mg  650 mg Per Tube Q4H Gleason, Otilio Carpen, PA-C   650 mg at 06/23/21 1010   Or   acetaminophen (TYLENOL) suppository 650 mg  650 mg Rectal Q4H Gleason, Otilio Carpen, PA-C       acetaminophen (TYLENOL) tablet 650 mg  650 mg Oral Q6H PRN Howerter, Justin B, DO       Or   acetaminophen (TYLENOL) suppository 650 mg  650 mg Rectal Q6H PRN Howerter, Justin B, DO       albuterol (PROVENTIL) (2.5 MG/3ML) 0.083% nebulizer solution 2.5 mg  2.5 mg Nebulization Q4H PRN Icard, Bradley L, DO       atorvastatin (LIPITOR) tablet 40 mg  40 mg Per Tube QHS Candee Furbish, MD   40 mg at 06/22/21 2109   bethanechol (URECHOLINE) tablet 10 mg  10 mg Per Tube TID Candee Furbish, MD   10  mg at 06/23/21 1010   chlorhexidine gluconate (MEDLINE KIT) (PERIDEX) 0.12 % solution 15 mL  15 mL Mouth Rinse BID Gleason, Otilio Carpen, PA-C   15 mL at 06/23/21 0800   Chlorhexidine Gluconate Cloth 2 % PADS 6 each  6 each Topical Daily Mannam, Praveen, MD   6 each at 06/23/21 0710   docusate (COLACE) 50 MG/5ML liquid 100 mg  100 mg Per Tube BID Gleason, Otilio Carpen, PA-C   100 mg at 06/23/21 1010   fentaNYL (SUBLIMAZE) bolus via infusion 25-100 mcg  25-100 mcg Intravenous Q15 min PRN Gleason, Otilio Carpen, PA-C       fentaNYL 2574mcg in NS 248mL (70mcg/ml) infusion-PREMIX  25-200 mcg/hr Intravenous Continuous Gleason, Otilio Carpen, PA-C   Stopped at 06/22/21 1705    ferric gluconate (FERRLECIT) 250 mg in sodium chloride 0.9 % 250 mL IVPB  250 mg Intravenous Daily Justin Mend, MD 135 mL/hr at 06/23/21 1012 250 mg at 06/23/21 1012   heparin injection 5,000 Units  5,000 Units Subcutaneous Q8H Candee Furbish, MD   5,000 Units at 06/23/21 0550   insulin aspart (novoLOG) injection 0-9 Units  0-9 Units Subcutaneous Q4H Candee Furbish, MD   1 Units at 06/23/21 0805   lidocaine-EPINEPHrine (XYLOCAINE W/EPI) 1 %-1:100000 (with pres) injection 30 mL  30 mL Other Once Mannam, Hart Robinsons, MD       MEDLINE mouth rinse  15 mL Mouth Rinse 10 times per day Gleason, Otilio Carpen, PA-C   15 mL at 06/23/21 1011   midazolam (VERSED) injection 2 mg  2 mg Intravenous Once PRN [name] M, PA-C       norepinephrine (LEVOPHED) 4mg  in 271mL premix infusion  0-10 mcg/min Intravenous Titrated Frederik Pear, MD       ondansetron (ZOFRAN) injection 4 mg  4 mg Intravenous Q6H PRN Gleason, Otilio Carpen, PA-C       pantoprazole (PROTONIX) injection 40 mg  40 mg Intravenous Daily Gleason, Otilio Carpen, PA-C   40 mg at 06/23/21 1010   polyethylene glycol (MIRALAX / GLYCOLAX) packet 17 g  17 g Per Tube Daily Gleason, Otilio Carpen, PA-C   17 g at 06/23/21 1011   sodium bicarbonate tablet 1,300 mg  1,300 mg Per Tube BID Candee Furbish, MD   1,300 mg at 06/23/21 1010      Review of Systems: 10 systems reviewed and negative except per interval history/subjective  Physical Exam: Vitals:   06/23/21 0900 06/23/21 1000  BP: 140/81 107/62  Pulse:    Resp: 18 18  Temp: 99 F (37.2 C) 98.7 F (37.1 C)  SpO2: 99% 99%   No intake/output data recorded.  Intake/Output Summary (Last 24 hours) at 06/23/2021 1026 Last data filed at 06/23/2021 0400 Gross per 24 hour  Intake 339.14 ml  Output 555 ml  Net -215.86 ml    Constitutional: intubated  ENMT: ETT in place CV: normal rate, no edema Respiratory: clear BL Gastrointestinal: soft, non-tender, no palpable masses or hernias Skin: no visible  lesions or rashes GU: condom catheter with clear yellow urine Neuro: sedated on vent   Test Results I personally reviewed new and old clinical labs and radiology tests Lab Results  Component Value Date   NA 143 06/23/2021   K 4.4 06/23/2021   CL 113 (H) 06/23/2021   CO2 19 (L) 06/23/2021   BUN 42 (H) 06/23/2021   CREATININE 5.42 (H) 06/23/2021   CALCIUM 8.1 (L) 06/23/2021   ALBUMIN 2.3 (L)  06/23/2021   PHOS 4.7 (H) 06/23/2021

## 2021-06-23 NOTE — Progress Notes (Signed)
Initial Nutrition Assessment  DOCUMENTATION CODES:   Not applicable  INTERVENTION:   Initiate tube feeds via OG tube: - Vital 1.5 @ 50 ml/hr (1200 ml/day) - ProSource TF 45 ml daily  Tube feeding regimen provides 1840 kcal, 92 grams of protein, and 917 ml of H2O.   NUTRITION DIAGNOSIS:   Inadequate oral intake related to inability to eat as evidenced by NPO status.  GOAL:   Patient will meet greater than or equal to 90% of their needs  MONITOR:   Vent status, Labs, Weight trends, TF tolerance, I & O's  REASON FOR ASSESSMENT:   Ventilator, Consult Enteral/tube feeding initiation and management  ASSESSMENT:   70 year old male who presented to the ED on 9/29 after falling 10-12 feet. PMH of CKD stage IV, T2DM, HTN, HLD, BPH, anemia, hepatitis B. Pt admitted with acute renal failure on CKD stage IV, syncope.  10/04 - PEA arrest, intubated, insertion of chest tube for pneumothorax, s/p TEE showing no aortic dissection  Consult received for tube feeding initiation and management. Pt with OG tube in stomach per abdominal x-ray on 10/4.  Pt currently off all sedation. Continuous EEG showing no seizure. Pt with severe AKI. Nephrology following and pt undergoing work-up for etiology.  Admit weight: 69 kg Current weight: 74 kg  Pt with non-pitting edema to BLE.  Meal Completion: 20-100% on regular diet from 10/01-10/03  Patient is currently intubated on ventilator support MV: 9.1 L/min Temp (24hrs), Avg:98.5 F (36.9 C), Min:97.9 F (36.6 C), Max:99.1 F (37.3 C)  Medications reviewed and include: tylenol, colace, SSI q 4 hours, IV protonix, miralax, sodium bicarb 1300 mg BID, IV ferric gluconate  Labs reviewed: BUN 42, creatinine 5.42, phosphorus 4.7, hemoglobin 7.8 CBG's: 108-145 x 24 hours  UOP: 660 ml x 24 hours CT: 25 ml x 24 hours I/O's: +3.4 L since admit  NUTRITION - FOCUSED PHYSICAL EXAM:  Flowsheet Row Most Recent Value  Orbital Region No depletion   Upper Arm Region No depletion  Thoracic and Lumbar Region No depletion  Buccal Region Unable to assess  Temple Region No depletion  Clavicle Bone Region Mild depletion  Clavicle and Acromion Bone Region Mild depletion  Scapular Bone Region Unable to assess  Dorsal Hand Unable to assess  Patellar Region Mild depletion  Anterior Thigh Region Mild depletion  Posterior Calf Region Mild depletion  Edema (RD Assessment) Mild  [BLE]  Hair Reviewed  Eyes Unable to assess  Mouth Unable to assess  Skin Reviewed  Nails Reviewed       Diet Order:   Diet Order             Diet NPO time specified  Diet effective now                   EDUCATION NEEDS:   No education needs have been identified at this time  Skin:  Skin Assessment: Reviewed RN Assessment  Last BM:  06/21/21 small type 7  Height:   Ht Readings from Last 1 Encounters:  06/22/21 5\' 8"  (1.727 m)    Weight:   Wt Readings from Last 1 Encounters:  06/23/21 74 kg    BMI:  Body mass index is 24.81 kg/m.  Estimated Nutritional Needs:   Kcal:  1700-1900  Protein:  90-110 grams  Fluid:  1.7-1.9 L    Gustavus Bryant, MS, RD, LDN Inpatient Clinical Dietitian Please see AMiON for contact information.

## 2021-06-23 NOTE — Progress Notes (Signed)
PT Cancellation Note  Patient Details Name: Scott Woods MRN: [id number] DOB: 10/20/1950   Cancelled Treatment:    Reason Eval/Treat Not Completed: Patient not medically ready; patient remains intubated and sedated.  Will attempt another day.   Reginia Naas 06/23/2021, 2:39 PM Magda Kiel, PT Acute Rehabilitation Services Pager:[phone] [phone] 06/23/2021

## 2021-06-23 NOTE — Procedures (Signed)
Patient Name: Scott Woods  MRN: [id number]  Epilepsy Attending: Lora Havens  Referring Physician/Provider: Elease Etienne, PA Duration: 06/22/2021 0949 to 06/23/2021 0949   Patient history: 69 year old male status post cardiac arrest.  EEG to evaluate for seizures.   Level of alertness: Comatose   AEDs during EEG study: None   Technical aspects: This EEG study was done with scalp electrodes positioned according to the 10-20 International system of electrode placement. Electrical activity was acquired at a sampling rate of 500Hz  and reviewed with a high frequency filter of 70Hz  and a low frequency filter of 1Hz . EEG data were recorded continuously and digitally stored.    Description: EEG initially showed near continuous generalized background attenuation which was reactive to tactile stimulation. Gradually  EEG evolved into intermittent generalized low amplitude 2 to 3 Hz delta slowing lasting 2 to 4 seconds admixed with generalized background attenuation lasting 2 to 4 seconds.  After around 1300 on 06/22/2021, EEG showed continuous generalized 3 to 5 Hz theta-delta slowing. Intermittent generalized periodic discharges with triphasic morphology at 1.5 Hz were also noted, predominantly when awake/stimulated. Hyperventilation and photic stimulation were not performed.      ABNORMALITY -Periodic discharges with triphasic morphology, generalized -Continued slow, generalized   IMPRESSION: This study showed showed generalized aortic discharges with triphasic morphology at 1.5 Hz.  This EEG pattern is on the ictal-interictal continuum.  However, the morphology of discharges, frequency as well as reactivity to stimulation can also be seen due to toxic-metabolic causes.  No definite seizures were seen during the study.      [name] [name]

## 2021-06-24 DIAGNOSIS — I469 Cardiac arrest, cause unspecified: Secondary | ICD-10-CM | POA: Diagnosis not present

## 2021-06-24 LAB — C4 COMPLEMENT: Complement C4, Body Fluid: 26 mg/dL (ref 12–38)

## 2021-06-24 LAB — BASIC METABOLIC PANEL
Anion gap: 12 (ref 5–15)
BUN: 49 mg/dL — ABNORMAL HIGH (ref 8–23)
CO2: 19 mmol/L — ABNORMAL LOW (ref 22–32)
Calcium: 8.6 mg/dL — ABNORMAL LOW (ref 8.9–10.3)
Chloride: 112 mmol/L — ABNORMAL HIGH (ref 98–111)
Creatinine, Ser: 5.83 mg/dL — ABNORMAL HIGH (ref 0.61–1.24)
GFR, Estimated: 10 mL/min — ABNORMAL LOW (ref >60)
Glucose, Bld: 191 mg/dL — ABNORMAL HIGH (ref 70–99)
Potassium: 4.5 mmol/L (ref 3.5–5.1)
Sodium: 143 mmol/L (ref 135–145)

## 2021-06-24 LAB — MAGNESIUM
Magnesium: 2.4 mg/dL (ref 1.7–2.4)
Magnesium: 2.4 mg/dL (ref 1.7–2.4)

## 2021-06-24 LAB — GLUCOSE, CAPILLARY
Glucose-Capillary: 158 mg/dL — ABNORMAL HIGH (ref 70–99)
Glucose-Capillary: 96 mg/dL (ref 70–99)
Glucose-Capillary: 148 mg/dL — ABNORMAL HIGH (ref 70–99)
Glucose-Capillary: 187 mg/dL — ABNORMAL HIGH (ref 70–99)
Glucose-Capillary: 141 mg/dL — ABNORMAL HIGH (ref 70–99)

## 2021-06-24 LAB — CBC
HCT: 23.8 % — ABNORMAL LOW (ref 39.0–52.0)
Hemoglobin: 7.9 g/dL — ABNORMAL LOW (ref 13.0–17.0)
MCH: 31.2 pg (ref 26.0–34.0)
MCHC: 33.2 g/dL (ref 30.0–36.0)
MCV: 94.1 fL (ref 80.0–100.0)
Platelets: 163 10*3/uL (ref 150–400)
RBC: 2.53 MIL/uL — ABNORMAL LOW (ref 4.22–5.81)
RDW: 13.4 % (ref 11.5–15.5)
WBC: 8.8 10*3/uL (ref 4.0–10.5)
nRBC: 0 % (ref 0.0–0.2)

## 2021-06-24 LAB — C3 COMPLEMENT: C3 Complement: 106 mg/dL (ref 82–167)

## 2021-06-24 LAB — PHOSPHORUS
Phosphorus: 5.1 mg/dL — ABNORMAL HIGH (ref 2.5–4.6)
Phosphorus: 5.5 mg/dL — ABNORMAL HIGH (ref 2.5–4.6)

## 2021-06-24 MED ORDER — FENTANYL CITRATE PF 50 MCG/ML IJ SOSY: 25.0000 ug | PREFILLED_SYRINGE | INTRAMUSCULAR | Status: DC | PRN

## 2021-06-24 MED FILL — Medication: Qty: 1 | Status: AC

## 2021-06-24 NOTE — Progress Notes (Signed)
Subjective: No acute events overnight.  Opening eyes today but not tracking people, not following commands yet.  ROS: Unable to obtain due to poor mental status  Examination  Vital signs in last 24 hours: Temp:  [97.9 F (36.6 C)-99.6 F (37.6 C)] 99.6 F (37.6 C) (10/06 0752) Pulse Rate:  [69-90] 90 (10/06 0724) Resp:  [13-21] 13 (10/06 1000) BP: (98-192)/(59-113) 135/62 (10/06 1000) SpO2:  [97 %-100 %] 99 % (10/06 1000) FiO2 (%):  [30 %] 30 % (10/06 0724) Weight:  [73.5 kg] 73.5 kg (10/06 0500)  General: lying in bed, NAD CVS: pulse-normal rate and rhythm RS: Intubated, CTAB Extremities: normal, warm Neuro: Eyes open spontaneously, PERRLA, corneal reflex intact, gag reflex intact, not tracking examiner in room, not following commands, withdrawal with antigravity strength in all 4 extremities  Basic Metabolic Panel: Recent Labs  Lab 06/18/21 0425 06/18/21 1211 06/21/21 0017 06/22/21 0023 06/22/21 4854 06/22/21 6270 06/23/21 0123 06/23/21 0837 06/23/21 1619 06/24/21 0431  NA 136   < > 137 138 142 141 139 143  --  143  K 6.0*   < > 4.6 4.6 4.5 4.8 4.2 4.4  --  4.5  CL 107   < > 112* 111  --  112* 113*  --   --  112*  CO2 20*   < > 17* 19*  --  21* 19*  --   --  19*  GLUCOSE 115*   < > 112* 123*  --  169* 134*  --   --  191*  BUN 40*   < > 38* 38*  --  38* 42*  --   --  49*  CREATININE 6.13*   < > 5.17* 5.04*  --  5.22* 5.42*  --   --  5.83*  CALCIUM 9.3   < > 8.4* 8.6*  --  8.5* 8.1*  --   --  8.6*  MG 2.1  --   --   --   --  1.7 2.3  --  2.3 2.4  PHOS 4.3   < > 4.9* 4.3  --   --  4.7*  --  4.7* 5.1*   < > = values in this interval not displayed.    CBC: Recent Labs  Lab 06/14/2021 2100 06/18/21 0425 06/22/21 0023 06/22/21 3500 06/22/21 0731 06/22/21 1845 06/23/21 0123 06/23/21 0837 06/24/21 0431  WBC 8.8   < > 5.5  --  9.8 9.2 6.8  --  8.8  NEUTROABS 7.0  --   --   --   --   --  5.4  --   --   HGB 8.2*   < > 7.5*   < > 7.5* 9.3* 8.3* 7.8* 7.9*  HCT 25.6*    < > 22.2*   < > 22.4* 27.8* 23.9* 23.0* 23.8*  MCV 100.0   < > 95.7  --  95.7 93.0 92.3  --  94.1  PLT 192   < > 142*  --  173 184 157  --  163   < > = values in this interval not displayed.    Coagulation Studies: No results for input(s): LABPROT, INR in the last 72 hours.  Imaging No new brain imaging overnight  ASSESSMENT AND PLAN: 70 year old male status post in-hospital cardiac arrest on 06/22/2021 at 3:35 AM, 20 minutes to ROSC.   Cardiac arrest -LTM EEG showed generalized periodic discharges with triphasic morphology at 1.5 -2 Hz. -Right more than left upper extremity stimulation to  his myoclonus could be secondary to renal failure.  Less likely post anoxic myoclonus because it is typically not this only after cardiac arrest   Recommendations: -No definite evidence of seizures.  Therefore we will hold off starting any AEDs.  However, if the EEG shows any evidence of seizures can start Keppra 250 mg twice daily -Continue LTM EEG to look for intermittent seizures -We will obtain MRI brain without contrast at 72 hours (Friday) -Patient had in-hospital cardiac arrest.  His exam has improved with eye-opening today, has cranial nerve reflexes and withdraws to noxious stimuli in all extremities.  We will need about 72 hours off sedation for neuro prognostication. -Minimize sedation, continue seizure precautions -As needed IV Ativan 2 mg for clinical seizure-like activity -Management of rest of comorbidities per primary team  I have spent a total of  25 minutes with the patient reviewing hospital notes,  test results, labs and examining the patient as well as establishing an assessment and plan that was discussed personally with the patient's RN.  > 50% of time was spent in direct patient care.   Zeb Comfort Epilepsy [facility] For questions after 5pm please refer to AMION to reach the Neurologist on call

## 2021-06-24 NOTE — Progress Notes (Signed)
PT Cancellation Note  Patient Details Name: Scott Woods MRN: [id number] DOB: Aug 23, 1951   Cancelled Treatment:    Reason Eval/Treat Not Completed: Patient not medically ready at this time. Pt s/p cardiac arrest 10/4 and is currently intubated, sedated, and not appropriate for therapy re-evaluation. Will sign off at this time, please re-consult when medically appropriate.   West Carbo, PT, DPT   Acute Rehabilitation Department Pager #: [phone]   Sandra Cockayne 06/24/2021, 11:06 AM

## 2021-06-24 NOTE — Progress Notes (Addendum)
NAME:  Scott Woods, MRN:  [id number], DOB:  11/14/1950, LOS: 7 ADMISSION DATE:  05/20/2021, CONSULTATION DATE:  06/22/2021 REFERRING MD:  Code team CHIEF COMPLAINT:  PEA arrest   History of Present Illness:  70 year old man initially admitted to 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 PMHx significant for HTN, HLD, T2DM, CKD stage IV, history of Hepatitis B, adrenal nodule, prostate CA.  On admission, patient denied any preceding symptoms prior to syncopal episode. CT Head was negative. Labs demonstrated Cr 6.2 (baseline 2.7) and K 6.3.  Hyperkalemia improved with Lokelma and insulin.  He was admitted to the hospitalist service.  Overnight 10/4, patient became agitated and hypertensive and was given Ativan and hydralazine.  A short time later he suffered a PEA arrest. Code Blue was called and ROSC obtained after 20 minutes CPR. Patient was intubated with post-intubation CXR showing L pneumothorax. Chest tube was placed by CCM.  Transferred to CVICU for further care.  Pertinent Medical History:   Past Medical History:  Diagnosis Date   Adrenal nodule ([place])    Blood in the urine 05/08/2015   Borderline diabetes    Chronic kidney disease    CKD (chronic kidney disease), stage II    Diabetes mellitus without complication (HCC)    Elevated PSA    Gross hematuria    H/O type B viral hepatitis 05/08/2015   When he was a child    History of hepatitis B    HTN (hypertension)    Hyperlipidemia with target LDL less than 100    Obesity    Pre-ulcerative calluses 03/28/2016   great toe    Prostate cancer ([place])    Significant Hospital Events: Including procedures, antibiotic start and stop dates in addition to other pertinent events   9/29 admitted to hospitalist service 10/4 PEA arrest, intubation, transfer to [place], left chest tube placed for post-code PTX. LTM EEG, c/f myoclonus. CT Head NAICA. Bedside TEE negative for dissection. 10/5 sedation turned  off  Interim History / Subjective:  No significant events overnight BP intermittently hypertensive Continues to make urine - UOP, 760 mL/24H - Nephro following Neuro exam stable to slightly improved LTM EEG in place Likely needs more time  Objective:  Blood pressure (!) 192/92, pulse 90, temperature 99.6 F (37.6 C), temperature source Oral, resp. rate 15, height 5\' 8"  (1.727 m), weight 73.5 kg, SpO2 99 %.    Vent Mode: CPAP;PSV FiO2 (%):  [30 %] 30 % Set Rate:  [18 bmp] 18 bmp Vt Set:  [550 mL] 550 mL PEEP:  [5 cmH20] 5 cmH20 Pressure Support:  [10 cmH20] 10 cmH20 Plateau Pressure:  [12 cmH20-17 cmH20] 12 cmH20   Intake/Output Summary (Last 24 hours) at 06/24/2021 0942 Last data filed at 06/24/2021 0800 Gross per 24 hour  Intake 720.52 ml  Output 769 ml  Net -48.48 ml    Filed Weights   06/22/21 0436 06/23/21 0500 06/24/21 0500  Weight: 70.8 kg 74 kg 73.5 kg   Physical Examination: General: Acutely ill-appearing middle-aged man in NAD. HEENT: [place]/AT, anicteric sclera, pupils equal and round, sluggishly reactive, dry mucous membranes. Neuro:  Decreased responsiveness.  Responds to noxious stimuli. and Withdraws to pain in all 4 extremities. Not following commands. +Cough and +Gag  CV: RRR, no m/g/r. PULM: Breathing even and unlabored on vent (PEEP 5, FiO2 30%) No significant rhonchi/rales. GI: Soft, nontender, nondistended. Normoactive bowel sounds. Extremities: Trace LE edema noted. Skin: Warm/dry extremities  Resolved  Hospital Problem List:   Hyperkalemia  Assessment & Plan:   Witnessed in-hospital PEA arrest, unclear etiology 20 minutes ACLS before ROSC, currently unclear etiology. K WNL prior to code. PE considered, but unlikely as Echo 10/2 without e/o R heart strain. - Supportive care - S/p TEE 10/4 without evidence of dissection - Target normothermia - Ongoing workup re: etiology of arrest - Likely needs more time from a neurologic recovery/prognosis  standpoint  Acute hypoxic respiratory failure 2/2 PEA arrest - Continue full vent support (4-8cc/kg IBW) - Wean FiO2 for O2 sat > 90% - Daily WUA/SBT - VAP bundle - Pulmonary hygiene - RT/bronchodilator protocol - Continues to be off sedation (off since 10/5 AM) for goal RASS 0 to -1  - Continue to minimize sedation as able to allow for accurate neurologic assessment  Acute encephalopathy post-PEA arrest, c/f anoxic brain injury - Neuro exam stable with slight improvement, likely needs more time for neurologic recovery  - Plan for MRI brain without contrast on Friday 10/7 - LTM EEG - no seizure activity to date - Keppra 1000mg  x 1 given 10/4 for presumed myoclonus - CT Head 10/4 NAICA - Neuro consult as appropriate - Neuroprotective measures: HOB > 30 degrees, normoglycemia, normothermia, electrolytes WNL - Minimize sedation (off Fentanyl 10/5AM)  Anemia Hemoglobin stable at 7.9 and no overt signs of bleeding. - Trend CBC - Keep active T&S - Transfuse for Hgb < 7.0 or active bleeding - Last transfusion: 1U PRBCs 10/4  Acute kidney injury on chronic CKD stage IV Oliguria - Nephrology following, appreciate recs - No indication for HD at this time, will continue to monitor - GN and MM work ups have been negative so far - Trend BMP - Replete electrolytes as indicated - Monitor I&Os - F/u UPEP and complement studies - Avoid nephrotoxic agents as able - Ensure adequate renal perfusion  Hypertension - Intermittent episodes of hypertension, otherwise normotensive  - If BP continues to increase, consider starting low dose amlodipine or PRN for BP control  - Continue to hold home antihypertensives in the setting of post-code hypotension  Best Practice: (right click and "Reselect all SmartList Selections" daily)   Diet/type: NPO DVT prophylaxis: prophylactic heparin  GI prophylaxis: PPI Lines: N/A Foley:  N/A Code Status:  full code Last date of multidisciplinary goals of  care discussion [10/4 Son would like to be primary decisionmaker]   Jacqlyn Larsen, Medical Student   Pulmonary critical care attending:  This is a 70 year old gentleman, admitted for syncopal episode, past medical history of hypertension, hyperlipidemia, diabetes, CKD 4.  Patient had a witnessed PEA cardiac arrest in hospital.  Ultimately was intubated placed on mechanical life support.  EEG with concern of potential myoclonus.  Overall his neuro exam is slowly been improving.  He still on EEG.  We appreciate neurology input.  BP (!) 175/95   Pulse (!) 103   Temp 99.6 F (37.6 C) (Oral)   Resp 15   Ht 5\' 8"  (1.727 m)   Wt 73.5 kg   SpO2 98%   BMI 24.64 kg/m   Gen: Elderly male intubated mechanical life support, HEENT: NCAT, EEG leads on head Heart: Regular rhythm S1-S2 Lungs: Bilateral mechanically ventilated breath sounds Abdomen: Soft nontender nondistended Labs: Reviewed EEG results are reviewed and discussed with Dr. Hortense Ramal  Assessment: PEA cardiac arrest Acute hypoxemic respiratory failure Acute metabolic encephalopathy AKI on chronic kidney disease stage IV Anemia second above  Plan: Remains on full mechanical support I think he needs  more time, slowly waking up. Neuro exam slowly improving Neurology agrees. Continue SBT, SAT as tolerated Wean PEEP and FiO2 as tolerated Continue neuro assessment Continue EEG per neurology. Continue to observe kidney disease Still making urine, no indication for dialysis at this time.  This patient is critically ill with multiple organ system failure; which, requires frequent high complexity decision making, assessment, support, evaluation, and titration of therapies. This was completed through the application of advanced monitoring technologies and extensive interpretation of multiple databases. During this encounter critical care time was devoted to patient care services described in this note for 33 minutes.   Garner Nash,  DO [facility] 06/24/2021 12:03 PM

## 2021-06-24 NOTE — Procedures (Addendum)
Patient Name: HARCE VOLDEN  MRN: [id number]  Epilepsy Attending: Lora Havens  Referring Physician/Provider: Elease Etienne, PA Duration: 06/22/2021 0949 to 06/23/2021 0949   Patient history: 70 year old male status post cardiac arrest.  EEG to evaluate for seizures.   Level of alertness: Comatose   AEDs during EEG study: None   Technical aspects: This EEG study was done with scalp electrodes positioned according to the 10-20 International system of electrode placement. Electrical activity was acquired at a sampling rate of 500Hz  and reviewed with a high frequency filter of 70Hz  and a low frequency filter of 1Hz . EEG data were recorded continuously and digitally stored.    Description: EEG showed continuous generalized 3 to 5 Hz theta-delta slowing. Intermittent generalized periodic discharges with triphasic morphology at 1.5-2 Hz were also noted, predominantly when awake/stimulated. Hyperventilation and photic stimulation were not performed.      ABNORMALITY -Periodic discharges with triphasic morphology, generalized -Continued slow, generalized   IMPRESSION: This study showed showed generalized periodic discharges with triphasic morphology at 1.5-2 Hz. This EEG pattern is on the ictal-interictal continuum.  However, the morphology of discharges, frequency as well as reactivity to stimulation can also be seen due to toxic-metabolic causes. Additionally there is evidence of severe diffuse encephalopathy. No definite seizures were seen during the study.  With h/o cardiac arrest, this eeg can be suggestive of anoxic-hypoxic brain injury.    Mardell Cragg [name]

## 2021-06-24 NOTE — Progress Notes (Addendum)
Nephrology Follow-Up Consult note   Assessment/Recommendations: Scott Woods is a/an 70 y.o. male with a past medical history significant for HTN, CAD, DM2, PVD, BPH, CKDa who present w/ fall and AKI on CKD 3a    Non-Oliguric AKI on CKD 3a: Baseline Cr 1.5 as of early 2022 then with severe AKI in May thought to be dehydration and/or obstruction now again with severe AKI (though unknown if had returned to baseline renal function after AKI) and cause is not completely clear.  Could all be hypotension related tubular injury but has moderate proteinuria with discrepancy between UACR and UP/C with concern for MM or amyloid.   -GN labs  have been normal: SPEP and SFLC ok. 24h UPEP ordered - couldn't be added on and will need to be collected still.  antiGBM, ANA, RPR, HIV, hepatitis all ok. Complements pending but doubt will be abnormal.  At this point a renal biopsy isn't indicated and it looks like this is likely a hemodynamic insult leading to the AKI.  -Fortunately no indication for dialysis at this time but will have to monitor closely; with his BUN in the 40s no significant uremia as etiology of confusion  -PRN IV fluids and diuretics to aim for euvolemia - seem ok currently; net  +3 L for admission but no gross overload. -Continue to monitor daily Cr, Dose meds for GFR -Monitor Daily I/Os, Daily weight  -Maintain MAP>65 for optimal renal perfusion.  -Avoid nephrotoxic medications including NSAIDs and Vanc/Zosyn combo -Hold lisinopril  PEA arrest: TEE no dissection, EEG no seizure; ongoing eval for etiology.   Encephalopathy: following PEA arrest - neuro following; no seizure on EEG. Plans for imaging Friday to help prognosticate.   With BUN 40s doubt uremia contributing and dialysis is not indicated currently.  Anemia: Worsening normocytic anemia with Hb now 7-8s, was 10.9 in 01/2021.  Iron is low will giving IV iron.  Myeloma w/u per above unrevealing to date.   Metabolic acidosis: Bicarb 19,  cont supplemental bicarb  Hypertension: Hold lisinopril. Has hydral PRN but BPs have been low intermittently   HLD: On statin   BPH: Continue home meds as tolerated  Will follow - call with concerns.    [facility] 06/24/2021 8:09 AM  ___________________________________________________________  CC: AKI  Interval History/Subjective:  No major events through yesterday.  Remains intubated, encephalopathic and not requiring sedation on vent. Rouses to noxious stim only.  UOP 743mL yesterday. Labs this AM BUN 42 > 49, Cr 5.4 >5.8, bicarb 19.   Medications:  Current Facility-Administered Medications  Medication Dose Route Frequency Provider Last Rate Last Admin   0.9 %  sodium chloride infusion   Intravenous Continuous Justin Mend, MD   Stopped at 06/22/21 0500   0.9 %  sodium chloride infusion  250 mL Intravenous Continuous Ogan, Kerry Kass, MD       acetaminophen (TYLENOL) tablet 650 mg  650 mg Oral Q4H Gleason, Otilio Carpen, PA-C       Or   acetaminophen (TYLENOL) 160 MG/5ML solution 650 mg  650 mg Per Tube Q4H Gleason, Otilio Carpen, PA-C   650 mg at 06/24/21 7425   Or   acetaminophen (TYLENOL) suppository 650 mg  650 mg Rectal Q4H Gleason, Otilio Carpen, PA-C       acetaminophen (TYLENOL) tablet 650 mg  650 mg Oral Q6H PRN Howerter, Justin B, DO       Or   acetaminophen (TYLENOL) suppository 650 mg  650  mg Rectal Q6H PRN Howerter, Justin B, DO       albuterol (PROVENTIL) (2.5 MG/3ML) 0.083% nebulizer solution 2.5 mg  2.5 mg Nebulization Q4H PRN Icard, Bradley L, DO       atorvastatin (LIPITOR) tablet 40 mg  40 mg Per Tube QHS Candee Furbish, MD   40 mg at 06/23/21 2142   bethanechol (URECHOLINE) tablet 10 mg  10 mg Per Tube TID Candee Furbish, MD   10 mg at 06/23/21 2142   chlorhexidine gluconate (MEDLINE KIT) (PERIDEX) 0.12 % solution 15 mL  15 mL Mouth Rinse BID Gleason, Otilio Carpen, PA-C   15 mL at 06/23/21 2006   Chlorhexidine Gluconate Cloth 2 % PADS 6  each  6 each Topical Daily Mannam, Praveen, MD   6 each at 06/23/21 0710   docusate (COLACE) 50 MG/5ML liquid 100 mg  100 mg Per Tube BID Gleason, Otilio Carpen, PA-C   100 mg at 06/23/21 2142   feeding supplement (PROSource TF) liquid 45 mL  45 mL Per Tube Daily Candee Furbish, MD   45 mL at 06/23/21 1603   feeding supplement (VITAL 1.5 CAL) liquid 1,000 mL  1,000 mL Per Tube Continuous Candee Furbish, MD 10 mL/hr at 06/23/21 2000 Rate Change at 06/23/21 2000   fentaNYL (SUBLIMAZE) bolus via infusion 25-100 mcg  25-100 mcg Intravenous Q15 min PRN Gleason, Otilio Carpen, PA-C       fentaNYL 2562mcg in NS 230mL (81mcg/ml) infusion-PREMIX  25-200 mcg/hr Intravenous Continuous Gleason, Otilio Carpen, PA-C   Stopped at 06/22/21 1705   ferric gluconate (FERRLECIT) 250 mg in sodium chloride 0.9 % 250 mL IVPB  250 mg Intravenous Daily Justin Mend, MD   Stopped at 06/23/21 1250   heparin injection 5,000 Units  5,000 Units Subcutaneous Q8H Candee Furbish, MD   5,000 Units at 06/24/21 0556   insulin aspart (novoLOG) injection 0-9 Units  0-9 Units Subcutaneous Q4H Candee Furbish, MD   1 Units at 06/24/21 0411   lidocaine-EPINEPHrine (XYLOCAINE W/EPI) 1 %-1:100000 (with pres) injection 30 mL  30 mL Other Once Mannam, Hart Robinsons, MD       MEDLINE mouth rinse  15 mL Mouth Rinse 10 times per day Gleason, Otilio Carpen, PA-C   15 mL at 06/24/21 0559   midazolam (VERSED) injection 2 mg  2 mg Intravenous Once PRN [name] M, PA-C       ondansetron [facility]) injection 4 mg  4 mg Intravenous Q6H PRN Gleason, Otilio Carpen, PA-C       pantoprazole (PROTONIX) injection 40 mg  40 mg Intravenous Daily Gleason, Otilio Carpen, PA-C   40 mg at 06/23/21 1010   polyethylene glycol (MIRALAX / GLYCOLAX) packet 17 g  17 g Per Tube Daily Gleason, Otilio Carpen, PA-C   17 g at 06/23/21 1011   sodium bicarbonate tablet 1,300 mg  1,300 mg Per Tube BID Candee Furbish, MD   1,300 mg at 06/23/21 2141      Review of Systems: 10 systems reviewed and negative  except per interval history/subjective  Physical Exam: Vitals:   06/24/21 0724 06/24/21 0752  BP: (!) 155/77   Pulse: 90   Resp: 13   Temp:  99.6 F (37.6 C)  SpO2: 100%    No intake/output data recorded.  Intake/Output Summary (Last 24 hours) at 06/24/2021 0809 Last data filed at 06/24/2021 0400 Gross per 24 hour  Intake 350.52 ml  Output 679 ml  Net -328.48 ml  Constitutional: intubated  ENMT: ETT in place CV: normal rate, no edema Respiratory: clear BL Gastrointestinal: soft, non-tender, no palpable masses or hernias Skin: no visible lesions or rashes GU: condom catheter with clear yellow urine Neuro: on vent unsedated, will arouse and withdraw to painful stimulus   Test Results I personally reviewed new and old clinical labs and radiology tests Lab Results  Component Value Date   NA 143 06/24/2021   K 4.5 06/24/2021   CL 112 (H) 06/24/2021   CO2 19 (L) 06/24/2021   BUN 49 (H) 06/24/2021   CREATININE 5.83 (H) 06/24/2021   CALCIUM 8.6 (L) 06/24/2021   ALBUMIN 2.3 (L) 06/23/2021   PHOS 5.1 (H) 06/24/2021

## 2021-06-24 NOTE — Progress Notes (Signed)
OT Cancellation Note  Patient Details Name: Scott Woods MRN: [id number] DOB: [birth year]   Cancelled Treatment:    Reason Eval/Treat Not Completed: Medical issues which prohibited therapy (Pt remains intubated and sedated. Will sign off and await new order.)  [name] 06/24/2021, 8:18 AM Nestor Lewandowsky, OTR/L Acute Rehabilitation Services Pager: [phone] Office: [phone]

## 2021-06-24 NOTE — Progress Notes (Signed)
EEG LTM Maintance done. Imped good. No skin breakdown noted.

## 2021-06-24 NOTE — Progress Notes (Signed)
LTM maintenance completed; no skin breakdown was seen behind A1, A2, or Fp1. Event button tested.

## 2021-06-25 ENCOUNTER — Inpatient Hospital Stay (HOSPITAL_COMMUNITY): Payer: Medicare (Managed Care)

## 2021-06-25 DIAGNOSIS — I469 Cardiac arrest, cause unspecified: Secondary | ICD-10-CM | POA: Diagnosis not present

## 2021-06-25 DIAGNOSIS — I10 Essential (primary) hypertension: Secondary | ICD-10-CM | POA: Diagnosis not present

## 2021-06-25 DIAGNOSIS — N179 Acute kidney failure, unspecified: Secondary | ICD-10-CM | POA: Diagnosis not present

## 2021-06-25 DIAGNOSIS — E875 Hyperkalemia: Secondary | ICD-10-CM | POA: Diagnosis not present

## 2021-06-25 LAB — RENAL FUNCTION PANEL
Albumin: 2.3 g/dL — ABNORMAL LOW (ref 3.5–5.0)
Anion gap: 11 (ref 5–15)
BUN: 56 mg/dL — ABNORMAL HIGH (ref 8–23)
CO2: 19 mmol/L — ABNORMAL LOW (ref 22–32)
Calcium: 8.9 mg/dL (ref 8.9–10.3)
Chloride: 114 mmol/L — ABNORMAL HIGH (ref 98–111)
Creatinine, Ser: 5.95 mg/dL — ABNORMAL HIGH (ref 0.61–1.24)
GFR, Estimated: 10 mL/min — ABNORMAL LOW (ref >60)
Glucose, Bld: 197 mg/dL — ABNORMAL HIGH (ref 70–99)
Phosphorus: 5.3 mg/dL — ABNORMAL HIGH (ref 2.5–4.6)
Potassium: 4.8 mmol/L (ref 3.5–5.1)
Sodium: 144 mmol/L (ref 135–145)

## 2021-06-25 LAB — GLUCOSE, CAPILLARY
Glucose-Capillary: 131 mg/dL — ABNORMAL HIGH (ref 70–99)
Glucose-Capillary: 148 mg/dL — ABNORMAL HIGH (ref 70–99)
Glucose-Capillary: 149 mg/dL — ABNORMAL HIGH (ref 70–99)
Glucose-Capillary: 158 mg/dL — ABNORMAL HIGH (ref 70–99)
Glucose-Capillary: 165 mg/dL — ABNORMAL HIGH (ref 70–99)
Glucose-Capillary: 169 mg/dL — ABNORMAL HIGH (ref 70–99)
Glucose-Capillary: 197 mg/dL — ABNORMAL HIGH (ref 70–99)

## 2021-06-25 LAB — MAGNESIUM: Magnesium: 2.4 mg/dL (ref 1.7–2.4)

## 2021-06-25 LAB — PHOSPHORUS: Phosphorus: 5.1 mg/dL — ABNORMAL HIGH (ref 2.5–4.6)

## 2021-06-25 IMAGING — MR MR HEAD W/O CM
9 of 11 series · 37 of 48 positions shown · non-contrast
Comparison: Head CT [DATE]

CLINICAL DATA: Mental status change, persistent or worsening
following cardiac arrest.

EXAM:
MRI HEAD WITHOUT CONTRAST
TECHNIQUE: Multiplanar, multiecho pulse sequences of the brain and surrounding
structures were obtained without intravenous contrast.

[Series 3: DWI · axial · 3.0mm · 1.09mm/px · z∈[-4,+146]mm · 11 of 102 slices shown (1 of 4)]
[im 1/102]
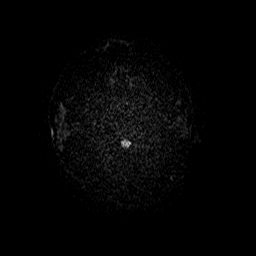
[im 11/102]
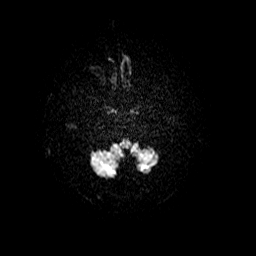
[im 21/102]
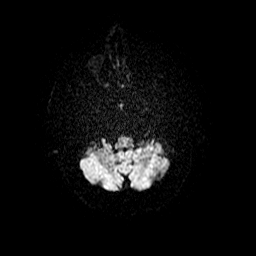
[im 31/102]
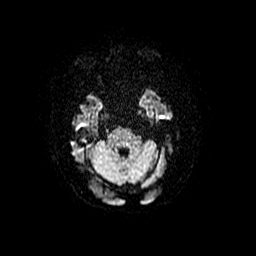
[im 41/102]
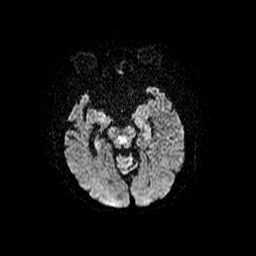
[im 51/102]
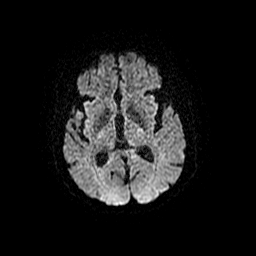
[im 61/102]
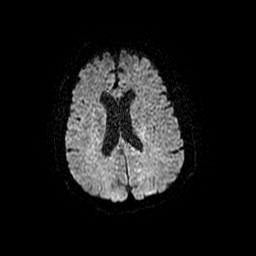
[im 71/102]
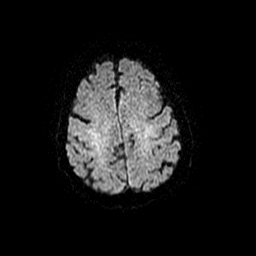
[im 81/102]
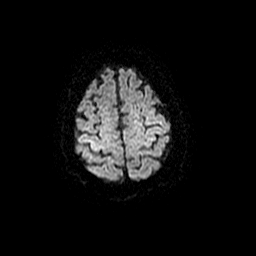
[im 91/102]
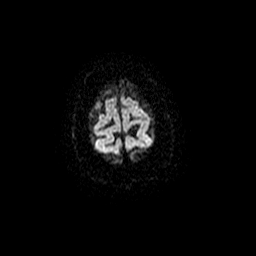
[im 102/102]
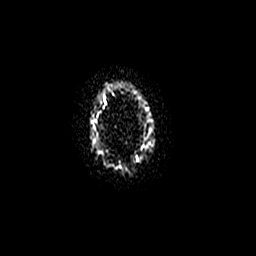

[Series 4: DWI · coronal · 5.0mm · 1.09mm/px · 7 of 70 slices shown (2 of 4)]
[im 1/70]
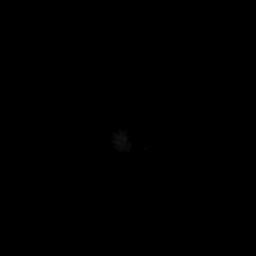
[im 12/70]
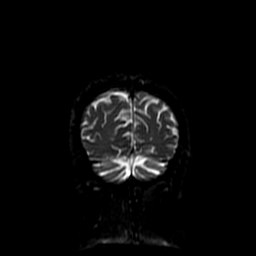
[im 24/70]
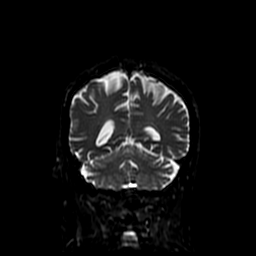
[im 35/70]
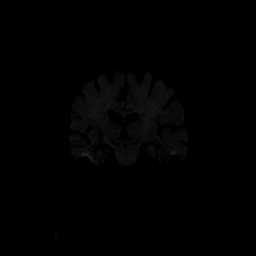
[im 47/70]
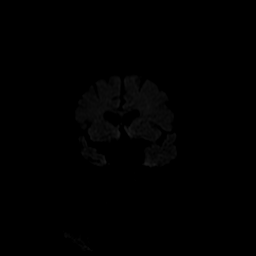
[im 58/70]
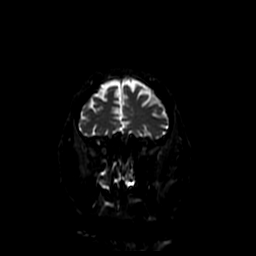
[im 70/70]
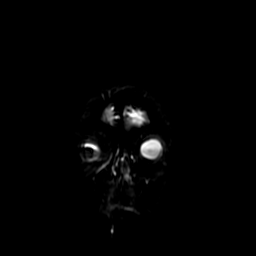

[Series 5: T1 · sagittal · 5.0mm · 0.47mm/px · 2 of 23 slices shown]
[im 1/23]
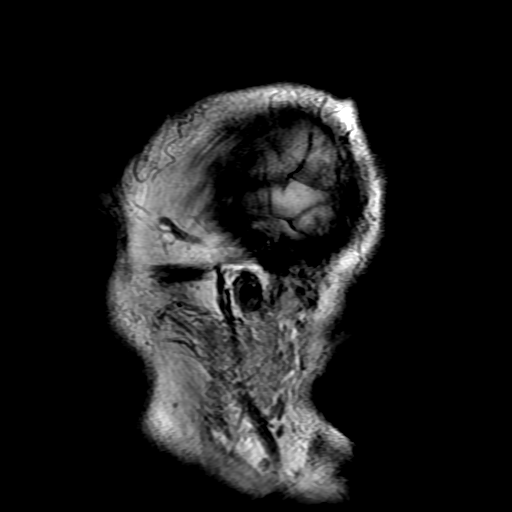
[im 23/23]
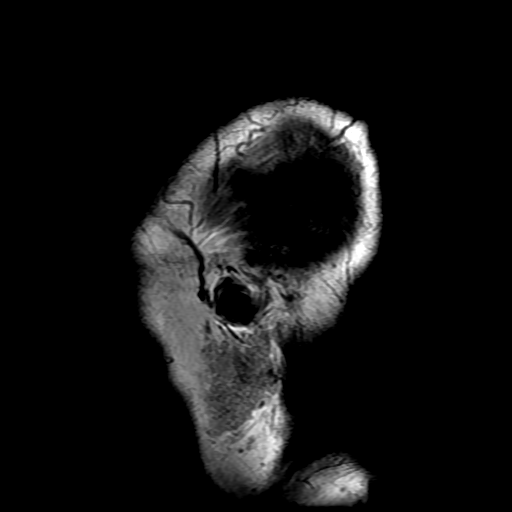

[Series 6: T2 · axial · 5.0mm · 0.43mm/px · z∈[-14,+136]mm · 2 of 26 slices shown]
[im 1/26]
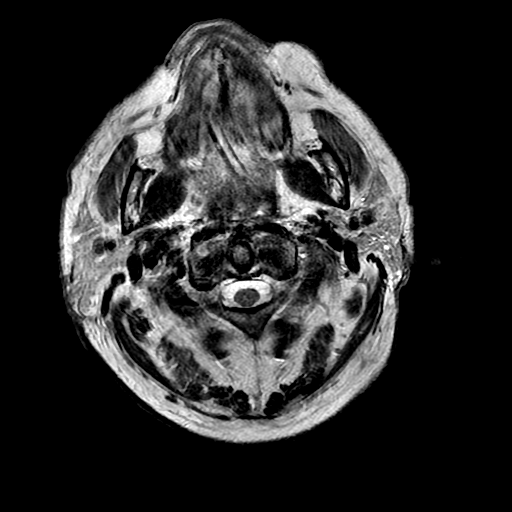
[im 26/26]
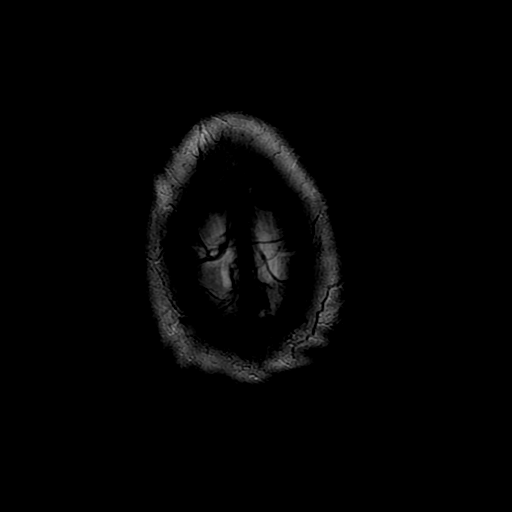

[Series 7: FLAIR · axial · 5.0mm · 0.43mm/px · z∈[-14,+136]mm · 2 of 26 slices shown (1 of 2)]
[im 1/26]
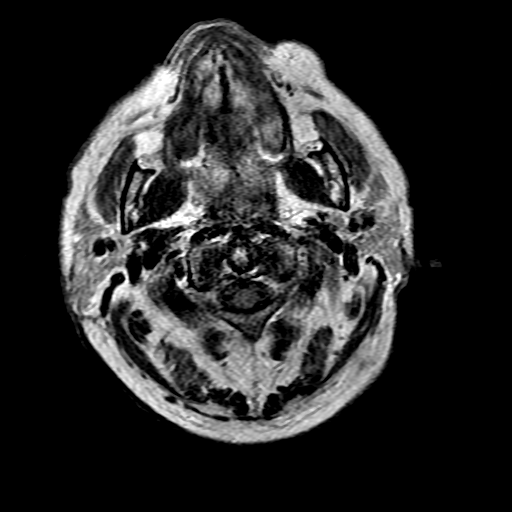
[im 26/26]
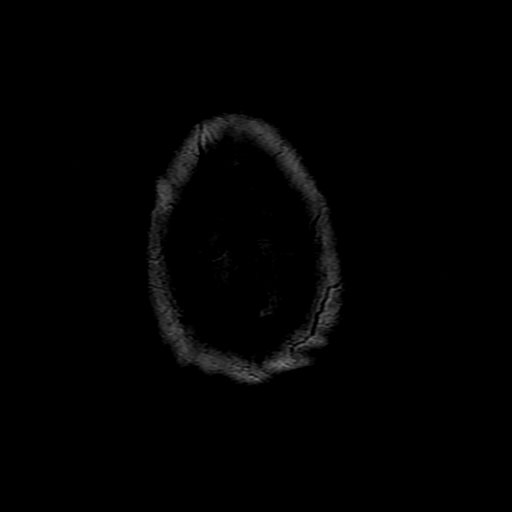

[Series 10: T2 post-contrast · coronal · 5.0mm · 0.39mm/px · 3 of 27 slices shown]
[im 1/27]
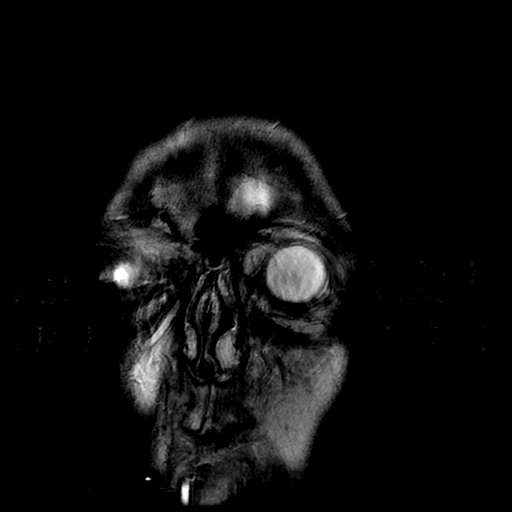
[im 14/27]
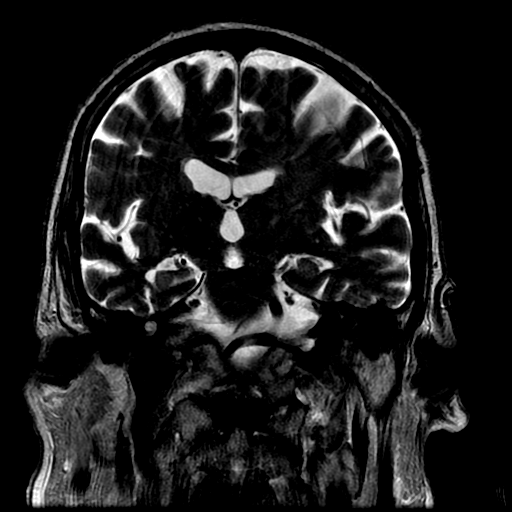
[im 27/27]
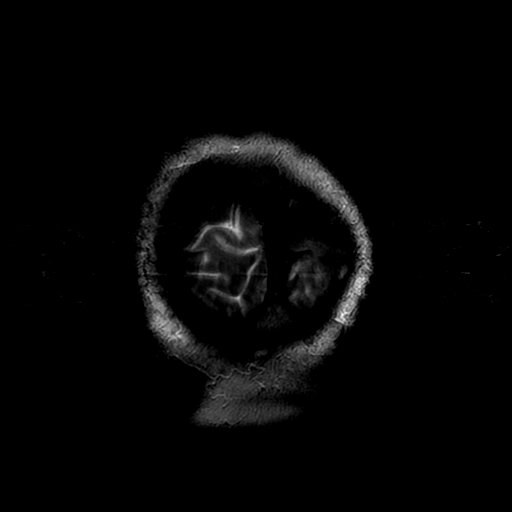

[Series 11: FLAIR · axial · 5.0mm · 0.43mm/px · z∈[-5,+144]mm · 2 of 26 slices shown (2 of 2)]
[im 1/26]
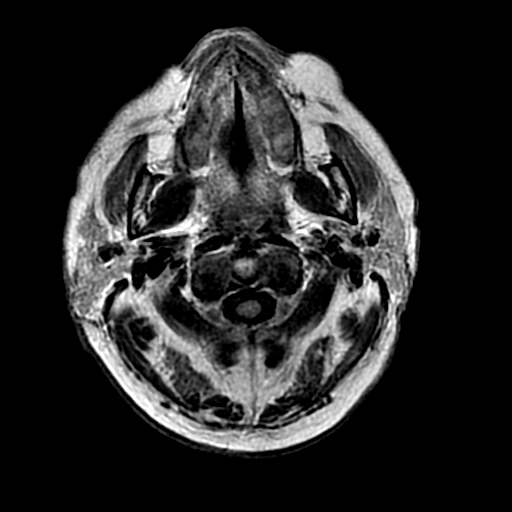
[im 26/26]
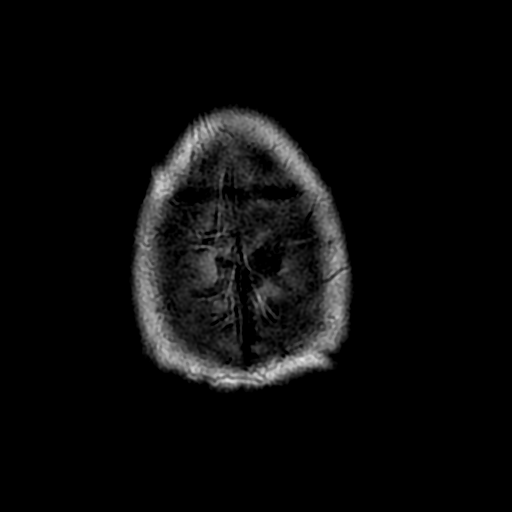

[Series 300: DWI · axial · 3.0mm · 1.09mm/px · z∈[-4,+146]mm · 5 of 51 slices shown (3 of 4)]
[im 1/51]
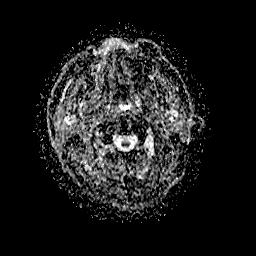
[im 13/51]
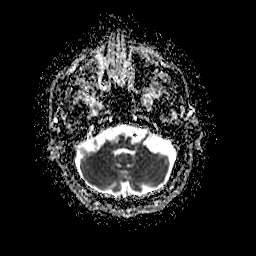
[im 26/51]
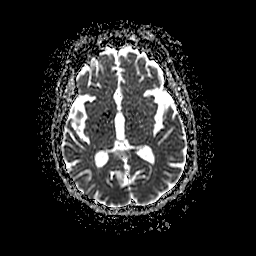
[im 38/51]
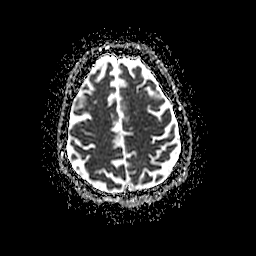
[im 51/51]
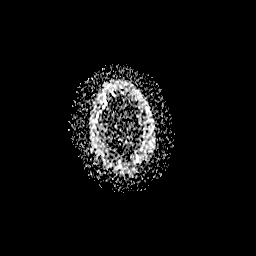

[Series 400: DWI · coronal · 5.0mm · 1.09mm/px · 3 of 35 slices shown (4 of 4)]
[im 1/35]
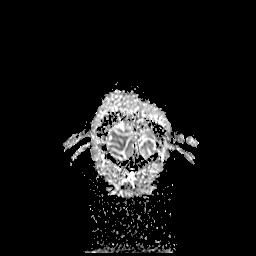
[im 18/35]
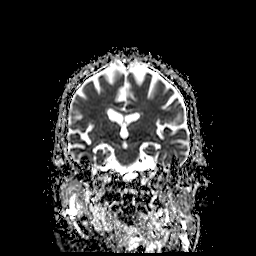
[im 35/35]
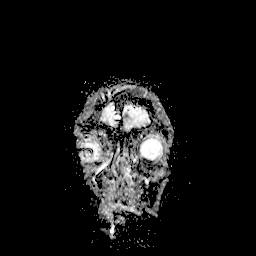

[37 of 48 positions shown; findings below may reference images not displayed]

FINDINGS: The study is partially degraded by motion. Brain: No acute
infarction, hemorrhage, hydrocephalus, extra-axial collection or
mass lesion.

Vascular: Normal flow voids.

Skull and upper cervical spine: Normal marrow signal.

Sinuses/Orbits: The right maxillary sinus is opacified with T1
hyperintense content, may represent inspissated secretion. The
orbits are maintained.

Other: None.
IMPRESSION: No acute intracranial abnormality.

## 2021-06-25 MED ORDER — SODIUM CHLORIDE 0.9% FLUSH
10.0000 mL | Freq: Three times a day (TID) | INTRAVENOUS | Status: DC
  Administered 2021-06-25 – 2021-07-02 (×13): 10 mL

## 2021-06-25 MED ORDER — AMLODIPINE BESYLATE 5 MG PO TABS
5.0000 mg | ORAL_TABLET | Freq: Every day | ORAL | Status: DC
  Administered 2021-06-25: 5 mg
  Filled 2021-06-25: qty 1

## 2021-06-25 NOTE — Progress Notes (Signed)
LTM maintenance completed; minor skin breakdown under Fp1, moved lead slightly to the right; event button tested.

## 2021-06-25 NOTE — Procedures (Addendum)
Patient Name: Scott Woods  MRN: [id number]  Epilepsy Attending: Lora Havens  Referring Physician/Provider: Elease Etienne, PA Duration: 06/23/2021 0949 to 06/24/2021 0945   Patient history: 70 year old male status post cardiac arrest.  EEG to evaluate for seizures.   Level of alertness: Comatose   AEDs during EEG study: None   Technical aspects: This EEG study was done with scalp electrodes positioned according to the 10-20 International system of electrode placement. Electrical activity was acquired at a sampling rate of 500Hz  and reviewed with a high frequency filter of 70Hz  and a low frequency filter of 1Hz . EEG data were recorded continuously and digitally stored.    Description: EEG showed continuous generalized 3 to 5 Hz theta-delta slowing. Intermittent generalized periodic discharges with triphasic morphology at 1.5-2 Hz were also noted, predominantly when awake/stimulated. Spikes were noted in left posterior quadrant. Hyperventilation and photic stimulation were not performed.      ABNORMALITY - Spikes, left posterior quadrant. -Periodic discharges with triphasic morphology, generalized -Continued slow, generalized   IMPRESSION: This study showed showed generalized peirodic discharges with triphasic morphology at 1.5-2 Hz. This EEG pattern is on the ictal-interictal continuum.  However, the morphology of discharges, frequency as well as reactivity to stimulation can also be seen due to toxic-metabolic causes. Additionally there is evidence of epileptogenicity in left posterior quadrant as well as severe diffuse encephalopathy. No definite seizures were seen during the study.  With h/o cardiac arrest, this eeg can be suggestive of anoxic-hypoxic brain injury.    Zaire Vanbuskirk [name]

## 2021-06-25 NOTE — Plan of Care (Signed)

## 2021-06-25 NOTE — Progress Notes (Addendum)
Subjective: No acute events overnight.  Patient continues to have eye opening but does not follow commands.  ROS: Unable to obtain due to poor mental status  Examination  Vital signs in last 24 hours: Temp:  [98 F (36.7 C)-99.8 F (37.7 C)] 98.3 F (36.8 C) (10/07 0800) Pulse Rate:  [70-103] 70 (10/07 0726) Resp:  [10-21] 21 (10/07 1100) BP: (108-179)/(58-107) 179/107 (10/07 1100) SpO2:  [98 %-100 %] 99 % (10/07 1100) FiO2 (%):  [30 %] 30 % (10/07 0900) Weight:  [69.3 kg] 69.3 kg (10/07 0500)  General: lying in bed, NAD CVS: pulse-normal rate and rhythm RS: Intubated, CTAB Extremities: normal, warm Neuro: Opens eyes spontaneously, PERRLA, corneal reflex intact, gag reflex intact, not tracking examiner in room, not following commands, withdrawal with antigravity strength in all 4 extremities  Basic Metabolic Panel: Recent Labs  Lab 06/22/21 0023 06/22/21 0433 06/22/21 0731 06/23/21 0123 06/23/21 0837 06/23/21 1619 06/24/21 0431 06/24/21 1641 06/25/21 0449  NA 138   < > 141 139 143  --  143  --  144  K 4.6   < > 4.8 4.2 4.4  --  4.5  --  4.8  CL 111  --  112* 113*  --   --  112*  --  114*  CO2 19*  --  21* 19*  --   --  19*  --  19*  GLUCOSE 123*  --  169* 134*  --   --  191*  --  197*  BUN 38*  --  38* 42*  --   --  49*  --  56*  CREATININE 5.04*  --  5.22* 5.42*  --   --  5.83*  --  5.95*  CALCIUM 8.6*  --  8.5* 8.1*  --   --  8.6*  --  8.9  MG  --    < > 1.7 2.3  --  2.3 2.4 2.4 2.4  PHOS 4.3  --   --  4.7*  --  4.7* 5.1* 5.5* 5.1*  5.3*   < > = values in this interval not displayed.    CBC: Recent Labs  Lab 06/22/21 0023 06/22/21 0433 06/22/21 0731 06/22/21 1845 06/23/21 0123 06/23/21 0837 06/24/21 0431  WBC 5.5  --  9.8 9.2 6.8  --  8.8  NEUTROABS  --   --   --   --  5.4  --   --   HGB 7.5*   < > 7.5* 9.3* 8.3* 7.8* 7.9*  HCT 22.2*   < > 22.4* 27.8* 23.9* 23.0* 23.8*  MCV 95.7  --  95.7 93.0 92.3  --  94.1  PLT 142*  --  173 184 157  --  163   < >  = values in this interval not displayed.     Coagulation Studies: No results for input(s): LABPROT, INR in the last 72 hours.  Imaging No new brain imaging overnight   ASSESSMENT AND PLAN: 70 year old male status post in-hospital cardiac arrest on 06/22/2021 at 3:35 AM, 20 minutes to ROSC.   Cardiac arrest Suspected anoxic/hypoxic encephalopathy EEG -LTM EEG showed generalized periodic discharges with triphasic morphology at 1.5 -2 Hz. -Right more than left upper extremity stimulation to his myoclonus could be secondary to renal failure.  Less likely post anoxic myoclonus because it is typically not this only after cardiac arrest   Recommendations: -No definite evidence of seizures.  Therefore we will hold off starting any AEDs.   -Discontinue LTM  EEG to obtain MRI brain.  Patient can be rehooked to EEG if has worsening mental status or any clinical seizure-like activity. -MRI brain without contrast to look for anoxic/hypoxic brain injury -Patient had in-hospital cardiac arrest.  His exam has improved with eye-opening, has cranial nerve reflexes and withdraws to noxious stimuli in all extremities.   -We will await MRI brain for further neurologic prognosis.  I do anticipate some amount of anoxic/hypoxic brain injury and it is possible the patient may need few more days for better assessment of neurologic improvement -Minimize sedation, continue seizure precautions -As needed IV Ativan 2 mg for clinical seizure-like activity -Management of rest of comorbidities per primary team   Japji Kok Epile[facility] For questions after 5pm please refer to AMION to reach the Neurologist on call

## 2021-06-25 NOTE — Progress Notes (Signed)
LTM discontinued; minor skin breakdown at Fp1 and Fz. [facility] notified of D/C.

## 2021-06-25 NOTE — Plan of Care (Signed)
  Problem: Safety: Goal: Non-violent Restraint(s) Outcome: Completed/Met

## 2021-06-25 NOTE — Progress Notes (Signed)
Pt transported to MRI and back without complications.  

## 2021-06-25 NOTE — Progress Notes (Signed)
Son, Zenia Resides updated by phone.  All questions answered.        Kennieth Rad, ACNP [facility] 06/25/2021, 6:00 PM  See Amion for pager If no response to pager, please call PCCM consult pager After 7:00 pm call Elink

## 2021-06-25 NOTE — Progress Notes (Signed)
Nephrology Follow-Up Consult note   Assessment/Recommendations: Scott Woods is a/an 70 y.o. male with a past medical history significant for HTN, CAD, DM2, PVD, BPH, CKDa who present w/ fall and AKI on CKD 3a    Non-Oliguric AKI on CKD 3a: Baseline Cr 1.5 as of early 2022 then with severe AKI in May thought to be dehydration and/or obstruction now again with severe AKI (though unknown if had returned to baseline renal function after AKI).  Serologies have been unrevealing (c/f myeloma/amyloid mainly) and it looks like this is likely hemodynamic insult leading to AKI initially then potentiated by his PEA arrest with another hemodynamic insult.   -Fortunately no indication for dialysis at this time but will have to monitor closely; with his BUN in the 40-50s no significant uremia as etiology of confusion.  His candidacy for dialysis should need develop hinges on his neurologic prognosis given c/f anoxic brain injury; MRI today. -PRN IV fluids and diuretics to aim for euvolemia - seem ok currently; net  +2.7 L for admission but no gross overload. -Continue to monitor daily Cr, Dose meds for GFR -Monitor Daily I/Os, Daily weight  -Maintain MAP>65 for optimal renal perfusion.  -Avoid nephrotoxic medications including NSAIDs and Vanc/Zosyn combo -Hold lisinopril  PEA arrest: TEE no dissection, EEG no seizure.   Encephalopathy: following PEA arrest - neuro following; no seizure on EEG. Plans for imaging today to help prognosticate.   With BUN 40-50s doubt uremia contributing and dialysis is not indicated currently.  Anemia: Worsening normocytic anemia with Hb now 7-8s, was 10.9 in 01/2021.  Iron is low will giving IV iron.  Myeloma w/u per above unrevealing to date.   Metabolic acidosis: Bicarb 19, cont supplemental bicarb  Hypertension: Hold lisinopril. Has hydral PRN but BPs have been low intermittently   HLD: On statin   BPH: Continue home meds as tolerated  Will follow - call with  concerns.    [facility] 06/25/2021 9:07 AM  ___________________________________________________________  CC: AKI  Interval History/Subjective:  No major events through yesterday.  Remains intubated, encephalopathic and not requiring sedation on vent. Rouses to noxious stim only.  UOP 1053mL yesterday, neg 668mL yest, +2.7 for admit . Labs this AM BUN 42 > 49>56, Cr 5.4 >5.8> 6, bicarb 19.   Medications:  Current Facility-Administered Medications  Medication Dose Route Frequency Provider Last Rate Last Admin   0.9 %  sodium chloride infusion  250 mL Intravenous Continuous Ogan, Kerry Kass, MD       acetaminophen (TYLENOL) tablet 650 mg  650 mg Oral Q4H Gleason, Otilio Carpen, PA-C       Or   acetaminophen (TYLENOL) 160 MG/5ML solution 650 mg  650 mg Per Tube Q4H Gleason, Otilio Carpen, PA-C   650 mg at 06/25/21 1610   Or   acetaminophen (TYLENOL) suppository 650 mg  650 mg Rectal Q4H Gleason, Otilio Carpen, PA-C       acetaminophen (TYLENOL) tablet 650 mg  650 mg Oral Q6H PRN Howerter, Justin B, DO       Or   acetaminophen (TYLENOL) suppository 650 mg  650 mg Rectal Q6H PRN Howerter, Justin B, DO       albuterol (PROVENTIL) (2.5 MG/3ML) 0.083% nebulizer solution 2.5 mg  2.5 mg Nebulization Q4H PRN Icard, Bradley L, DO       amLODipine (NORVASC) tablet 5 mg  5 mg Per Tube Daily Jacky Kindle, MD       atorvastatin (LIPITOR) tablet  40 mg  40 mg Per Tube QHS Candee Furbish, MD   40 mg at 06/24/21 2110   bethanechol (URECHOLINE) tablet 10 mg  10 mg Per Tube TID Candee Furbish, MD   10 mg at 06/24/21 2111   chlorhexidine gluconate (MEDLINE KIT) (PERIDEX) 0.12 % solution 15 mL  15 mL Mouth Rinse BID Gleason, Otilio Carpen, PA-C   15 mL at 06/25/21 1540   Chlorhexidine Gluconate Cloth 2 % PADS 6 each  6 each Topical Daily Mannam, Praveen, MD   6 each at 06/24/21 0934   docusate (COLACE) 50 MG/5ML liquid 100 mg  100 mg Per Tube BID Gleason, Otilio Carpen, PA-C   100 mg at 06/24/21 0932    feeding supplement (PROSource TF) liquid 45 mL  45 mL Per Tube Daily Candee Furbish, MD   45 mL at 06/24/21 0932   feeding supplement (VITAL 1.5 CAL) liquid 1,000 mL  1,000 mL Per Tube Continuous Candee Furbish, MD 40 mL/hr at 06/24/21 1840 1,000 mL at 06/24/21 1840   fentaNYL (SUBLIMAZE) bolus via infusion 25-100 mcg  25-100 mcg Intravenous Q15 min PRN Gleason, Otilio Carpen, PA-C       fentaNYL (SUBLIMAZE) injection 25-50 mcg  25-50 mcg Intravenous Q2H PRN Icard, Bradley L, DO       heparin injection 5,000 Units  5,000 Units Subcutaneous Q8H Candee Furbish, MD   5,000 Units at 06/25/21 0537   insulin aspart (novoLOG) injection 0-9 Units  0-9 Units Subcutaneous Q4H Candee Furbish, MD   2 Units at 06/25/21 0867   lidocaine-EPINEPHrine (XYLOCAINE W/EPI) 1 %-1:100000 (with pres) injection 30 mL  30 mL Other Once Mannam, Hart Robinsons, MD       MEDLINE mouth rinse  15 mL Mouth Rinse 10 times per day Gleason, Otilio Carpen, PA-C   15 mL at 06/25/21 6195   midazolam (VERSED) injection 2 mg  2 mg Intravenous Once PRN [name] M, PA-C       ondansetron [facility]) injection 4 mg  4 mg Intravenous Q6H PRN Gleason, Otilio Carpen, PA-C       pantoprazole (PROTONIX) injection 40 mg  40 mg Intravenous Daily Gleason, Otilio Carpen, PA-C   40 mg at 06/24/21 0933   polyethylene glycol (MIRALAX / GLYCOLAX) packet 17 g  17 g Per Tube Daily Gleason, Otilio Carpen, PA-C   17 g at 06/24/21 0931   sodium bicarbonate tablet 1,300 mg  1,300 mg Per Tube BID Candee Furbish, MD   1,300 mg at 06/24/21 2111      Review of Systems: 10 systems reviewed and negative except per interval history/subjective  Physical Exam: Vitals:   06/25/21 0600 06/25/21 0726  BP: (!) 151/81 108/62  Pulse:  70  Resp: 14 10  Temp:    SpO2: 99% 100%   No intake/output data recorded.  Intake/Output Summary (Last 24 hours) at 06/25/2021 0932 Last data filed at 06/25/2021 0400 Gross per 24 hour  Intake 445.59 ml  Output 980 ml  Net -534.41 ml     Constitutional: intubated  ENMT: ETT in place CV: normal rate, no edema Respiratory: clear BL Gastrointestinal: soft, non-tender Skin: no visible lesions or rashes GU: condom catheter with clear yellow urine Neuro: on vent unsedated, will arouse and withdraw to painful stimulus per RN, he didn't arouse to shoulder shake and calling voice loudly.   Test Results I personally reviewed new and old clinical labs and radiology tests Lab Results  Component Value Date  NA 144 06/25/2021   K 4.8 06/25/2021   CL 114 (H) 06/25/2021   CO2 19 (L) 06/25/2021   BUN 56 (H) 06/25/2021   CREATININE 5.95 (H) 06/25/2021   CALCIUM 8.9 06/25/2021   ALBUMIN 2.3 (L) 06/25/2021   PHOS 5.1 (H) 06/25/2021   PHOS 5.3 (H) 06/25/2021

## 2021-06-25 NOTE — Progress Notes (Signed)
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 PMHx significant for HTN, HLD, T2DM, CKD stage IV, history of Hepatitis B, adrenal nodule, prostate CA.  On admission, patient denied any preceding symptoms prior to syncopal episode. CT Head was negative. Labs demonstrated Cr 6.2 (baseline 2.7) and K 6.3.  Hyperkalemia improved with Lokelma and insulin.  He was admitted to the hospitalist service.  Overnight 10/4, patient became agitated and hypertensive and was given Ativan and hydralazine.  A short time later he suffered a PEA arrest. Code Blue was called and ROSC obtained after 20 minutes CPR. Patient was intubated with post-intubation CXR showing L pneumothorax. Chest tube was placed by CCM.  Transferred to CVICU for further care.  Pertinent Medical History:   Past Medical History:  Diagnosis Date   Adrenal nodule ([place])    Blood in the urine 05/08/2015   Borderline diabetes    Chronic kidney disease    CKD (chronic kidney disease), stage II    Diabetes mellitus without complication (HCC)    Elevated PSA    Gross hematuria    H/O type B viral hepatitis 05/08/2015   When he was a child    History of hepatitis B    HTN (hypertension)    Hyperlipidemia with target LDL less than 100    Obesity    Pre-ulcerative calluses 03/28/2016   great toe    Prostate cancer ([place])    Significant Hospital Events: Including procedures, antibiotic start and stop dates in addition to other pertinent events   9/29 admitted to hospitalist service 10/4 PEA arrest, intubation, transfer to [place], left chest tube placed for post-code PTX. LTM EEG, c/f myoclonus. CT Head NAICA. Bedside TEE negative for dissection. 10/5 sedation turned  off 10/6 intermittently hypertensive, remains on LTM, sCr 5.4-> 5.8 but still making urine, remains off sedation, weaning on PSV 10/5  Interim History / Subjective:   Has been weaning on PSV 10/5 since yesterday Ongoing LTM No other events.  Still not needing sedation sCr stable 5.83-> 5.95 UOP improving, 1L/ 24hrs  Objective:  Blood pressure 108/62, pulse 70, temperature 98 F (36.7 C), temperature source Oral, resp. rate 10, height 5\' 8"  (1.727 m), weight 69.3 kg, SpO2 100 %.    Vent Mode: PSV;CPAP FiO2 (%):  [30 %] 30 % PEEP:  [5 cmH20] 5 cmH20 Pressure Support:  [10 cmH20] 10 cmH20 Plateau Pressure:  [15 cmH20] 15 cmH20   Intake/Output Summary (Last 24 hours) at 06/25/2021 0930 Last data filed at 06/25/2021 0400 Gross per 24 hour  Intake 445.59 ml  Output 980 ml  Net -534.41 ml   Filed Weights   06/23/21 0500 06/24/21 0500 06/25/21 0500  Weight: 74 kg 73.5 kg 69.3 kg   Physical Examination: General:  Older male lying in bed in NAD, remains intubated on mechanical ventilation HEENT: MM pink/moist, ETT/ OGT, pupils 3/ reactive, anicteric, LTM leads in place Neuro: opens eyes w/noxious but no blink to threat, will move UE to noxious stimuli- almost appears to attempt to localize, withdrawals in LE CV: rr, NSR, no murmur PULM:  non labored on PSV 10/5, CTA, no secretions, sensitive gag, +cough, Left pigtail Ct to 20 sxn, no tidaling or airleak, flushes well  GI: soft, bs+ (last BM overnight), foley Extremities: warm/dry, no LE edema  Skin: no rashes   -400/ net +2.9L  UOP ~0.6 ml/kg/hr (0.4 yest) No chest tube output   Resolved Hospital Problem List:   Hyperkalemia  Assessment & Plan:   Witnessed in-hospital PEA arrest, unclear etiology 20 minutes ACLS before ROSC, currently unclear etiology. K WNL prior to code. PE considered, but unlikely as Echo 10/2 without e/o R heart strain. - S/p TEE 10/4 without evidence of dissection - continue supportive care - etiology  remains unclear at this time  Acute hypoxic respiratory failure 2/2 PEA arrest Left Pneumothorax - secondary to CPR - continue full MV support as needed.  Doing well on PSV however mental status remains barrier to extubation - VAP/ PPI - placed chest to water seal today, CXR in am  - ongoing bronchial hygiene   Acute encephalopathy post-PEA arrest, concern for anoxic brain injury - appreciate Neurology input - LTM with no definite seizures - MRI brain today  - seizures precautions - Maintain neuro protective measures; goal for eurothermia, euglycemia, eunatermia, normoxia, and PCO2 goal of 35-40 -  remains off sedation since 10/5  -  holding off on neuro prognostication for 72 hrs off sedation, last dose 10/5   Normocytic Anemia, last transfused 10/4, 1u PRBC - trend CBC, transfuse <7 - s/p iron replete   Acute kidney injury on chronic CKD stage IV, non-oliguric BPH NAGMA - Nephrology following, appreciate input - myeloma/ amyloid workup unrevealing  - ongoing bicarb per renal for metabolic acidosis - sCr stable, UOP picking up - still no needs for HD at this time - Trend BMP / urinary output - Replace electrolytes as indicated - Avoid nephrotoxic agents, ensure adequate renal perfusion - F/u UPEP and complement studies - consider voiding trial/ remains on bethanecol (foley replaced 10/4)   Hypertension - Intermittent episodes of hypertension, otherwise normotensive  - adding norvasc 5mg  today   Best Practice: (right click and "Reselect all SmartList Selections" daily)   Diet/type: NPO; TF  DVT prophylaxis: prophylactic heparin  GI prophylaxis: PPI Lines: N/A Foley:  N/A Code Status:  full code Last date of multidisciplinary goals of care discussion [10/4 Son would like to be primary decisionmaker]  No family at bedside on am rounds. Pending 10/7   CCT: 35 mins   Kennieth Rad, ACNP [facility] 06/25/2021, 9:32 AM  See Amion for  pager If no response to pager, please call PCCM consult pager After 7:00 pm call Elink

## 2021-06-26 ENCOUNTER — Inpatient Hospital Stay (HOSPITAL_COMMUNITY): Payer: Medicare (Managed Care)

## 2021-06-26 DIAGNOSIS — N183 Chronic kidney disease, stage 3 unspecified: Secondary | ICD-10-CM | POA: Diagnosis not present

## 2021-06-26 DIAGNOSIS — J9690 Respiratory failure, unspecified, unspecified whether with hypoxia or hypercapnia: Secondary | ICD-10-CM | POA: Diagnosis not present

## 2021-06-26 DIAGNOSIS — I469 Cardiac arrest, cause unspecified: Secondary | ICD-10-CM | POA: Diagnosis not present

## 2021-06-26 DIAGNOSIS — T17908A Unspecified foreign body in respiratory tract, part unspecified causing other injury, initial encounter: Secondary | ICD-10-CM | POA: Diagnosis not present

## 2021-06-26 DIAGNOSIS — N184 Chronic kidney disease, stage 4 (severe): Secondary | ICD-10-CM | POA: Diagnosis not present

## 2021-06-26 DIAGNOSIS — J9601 Acute respiratory failure with hypoxia: Secondary | ICD-10-CM

## 2021-06-26 DIAGNOSIS — G9341 Metabolic encephalopathy: Secondary | ICD-10-CM | POA: Diagnosis not present

## 2021-06-26 DIAGNOSIS — E875 Hyperkalemia: Secondary | ICD-10-CM | POA: Diagnosis not present

## 2021-06-26 DIAGNOSIS — N179 Acute kidney failure, unspecified: Secondary | ICD-10-CM | POA: Diagnosis not present

## 2021-06-26 LAB — RENAL FUNCTION PANEL
Albumin: 2.4 g/dL — ABNORMAL LOW (ref 3.5–5.0)
Albumin: 2.1 g/dL — ABNORMAL LOW (ref 3.5–5.0)
Anion gap: 11 (ref 5–15)
Anion gap: 11 (ref 5–15)
BUN: 61 mg/dL — ABNORMAL HIGH (ref 8–23)
BUN: 68 mg/dL — ABNORMAL HIGH (ref 8–23)
CO2: 21 mmol/L — ABNORMAL LOW (ref 22–32)
CO2: 21 mmol/L — ABNORMAL LOW (ref 22–32)
Calcium: 9.1 mg/dL (ref 8.9–10.3)
Calcium: 8.9 mg/dL (ref 8.9–10.3)
Chloride: 114 mmol/L — ABNORMAL HIGH (ref 98–111)
Chloride: 115 mmol/L — ABNORMAL HIGH (ref 98–111)
Creatinine, Ser: 5.56 mg/dL — ABNORMAL HIGH (ref 0.61–1.24)
Creatinine, Ser: 5.63 mg/dL — ABNORMAL HIGH (ref 0.61–1.24)
GFR, Estimated: 10 mL/min — ABNORMAL LOW (ref >60)
GFR, Estimated: 10 mL/min — ABNORMAL LOW (ref >60)
Glucose, Bld: 168 mg/dL — ABNORMAL HIGH (ref 70–99)
Glucose, Bld: 121 mg/dL — ABNORMAL HIGH (ref 70–99)
Phosphorus: 5.9 mg/dL — ABNORMAL HIGH (ref 2.5–4.6)
Phosphorus: 5.2 mg/dL — ABNORMAL HIGH (ref 2.5–4.6)
Potassium: 5.3 mmol/L — ABNORMAL HIGH (ref 3.5–5.1)
Potassium: 5.1 mmol/L (ref 3.5–5.1)
Sodium: 146 mmol/L — ABNORMAL HIGH (ref 135–145)
Sodium: 147 mmol/L — ABNORMAL HIGH (ref 135–145)

## 2021-06-26 LAB — POCT I-STAT 7, (LYTES, BLD GAS, ICA,H+H)
Acid-base deficit: 5 mmol/L — ABNORMAL HIGH (ref 0.0–2.0)
Bicarbonate: 20.6 mmol/L (ref 20.0–28.0)
Calcium, Ion: 1.24 mmol/L (ref 1.15–1.40)
HCT: 49 % (ref 39.0–52.0)
Hemoglobin: 16.7 g/dL (ref 13.0–17.0)
O2 Saturation: 99 %
Patient temperature: 98.09
Potassium: 4.8 mmol/L (ref 3.5–5.1)
Sodium: 145 mmol/L (ref 135–145)
TCO2: 22 mmol/L (ref 22–32)
pCO2 arterial: 39.4 mmHg (ref 32.0–48.0)
pH, Arterial: 7.326 — ABNORMAL LOW (ref 7.350–7.450)
pO2, Arterial: 148 mmHg — ABNORMAL HIGH (ref 83.0–108.0)

## 2021-06-26 LAB — PROTEIN AND GLUCOSE, CSF
Glucose, CSF: 100 mg/dL — ABNORMAL HIGH (ref 40–70)
Total  Protein, CSF: 45 mg/dL (ref 15–45)

## 2021-06-26 LAB — PROTIME-INR
INR: 1.1 (ref 0.8–1.2)
Prothrombin Time: 14.3 seconds (ref 11.4–15.2)

## 2021-06-26 LAB — BASIC METABOLIC PANEL
Anion gap: 10 (ref 5–15)
BUN: 63 mg/dL — ABNORMAL HIGH (ref 8–23)
CO2: 20 mmol/L — ABNORMAL LOW (ref 22–32)
Calcium: 8.8 mg/dL — ABNORMAL LOW (ref 8.9–10.3)
Chloride: 115 mmol/L — ABNORMAL HIGH (ref 98–111)
Creatinine, Ser: 5.58 mg/dL — ABNORMAL HIGH (ref 0.61–1.24)
GFR, Estimated: 10 mL/min — ABNORMAL LOW (ref >60)
Glucose, Bld: 203 mg/dL — ABNORMAL HIGH (ref 70–99)
Potassium: 4.9 mmol/L (ref 3.5–5.1)
Sodium: 145 mmol/L (ref 135–145)

## 2021-06-26 LAB — CBC
HCT: 27.9 % — ABNORMAL LOW (ref 39.0–52.0)
Hemoglobin: 9.2 g/dL — ABNORMAL LOW (ref 13.0–17.0)
MCH: 31.8 pg (ref 26.0–34.0)
MCHC: 33 g/dL (ref 30.0–36.0)
MCV: 96.5 fL (ref 80.0–100.0)
Platelets: 218 10*3/uL (ref 150–400)
RBC: 2.89 MIL/uL — ABNORMAL LOW (ref 4.22–5.81)
RDW: 13.6 % (ref 11.5–15.5)
WBC: 10.1 10*3/uL (ref 4.0–10.5)
nRBC: 0 % (ref 0.0–0.2)

## 2021-06-26 LAB — GLUCOSE, CAPILLARY
Glucose-Capillary: 192 mg/dL — ABNORMAL HIGH (ref 70–99)
Glucose-Capillary: 185 mg/dL — ABNORMAL HIGH (ref 70–99)
Glucose-Capillary: 149 mg/dL — ABNORMAL HIGH (ref 70–99)
Glucose-Capillary: 163 mg/dL — ABNORMAL HIGH (ref 70–99)
Glucose-Capillary: 116 mg/dL — ABNORMAL HIGH (ref 70–99)

## 2021-06-26 IMAGING — DX DG CHEST 1V PORT
1 series · 1 of 1 positions shown · non-contrast
Comparison: [DATE]

CLINICAL DATA: Central line placement, encephalopathy

EXAM:
PORTABLE CHEST 1 VIEW

[chest]
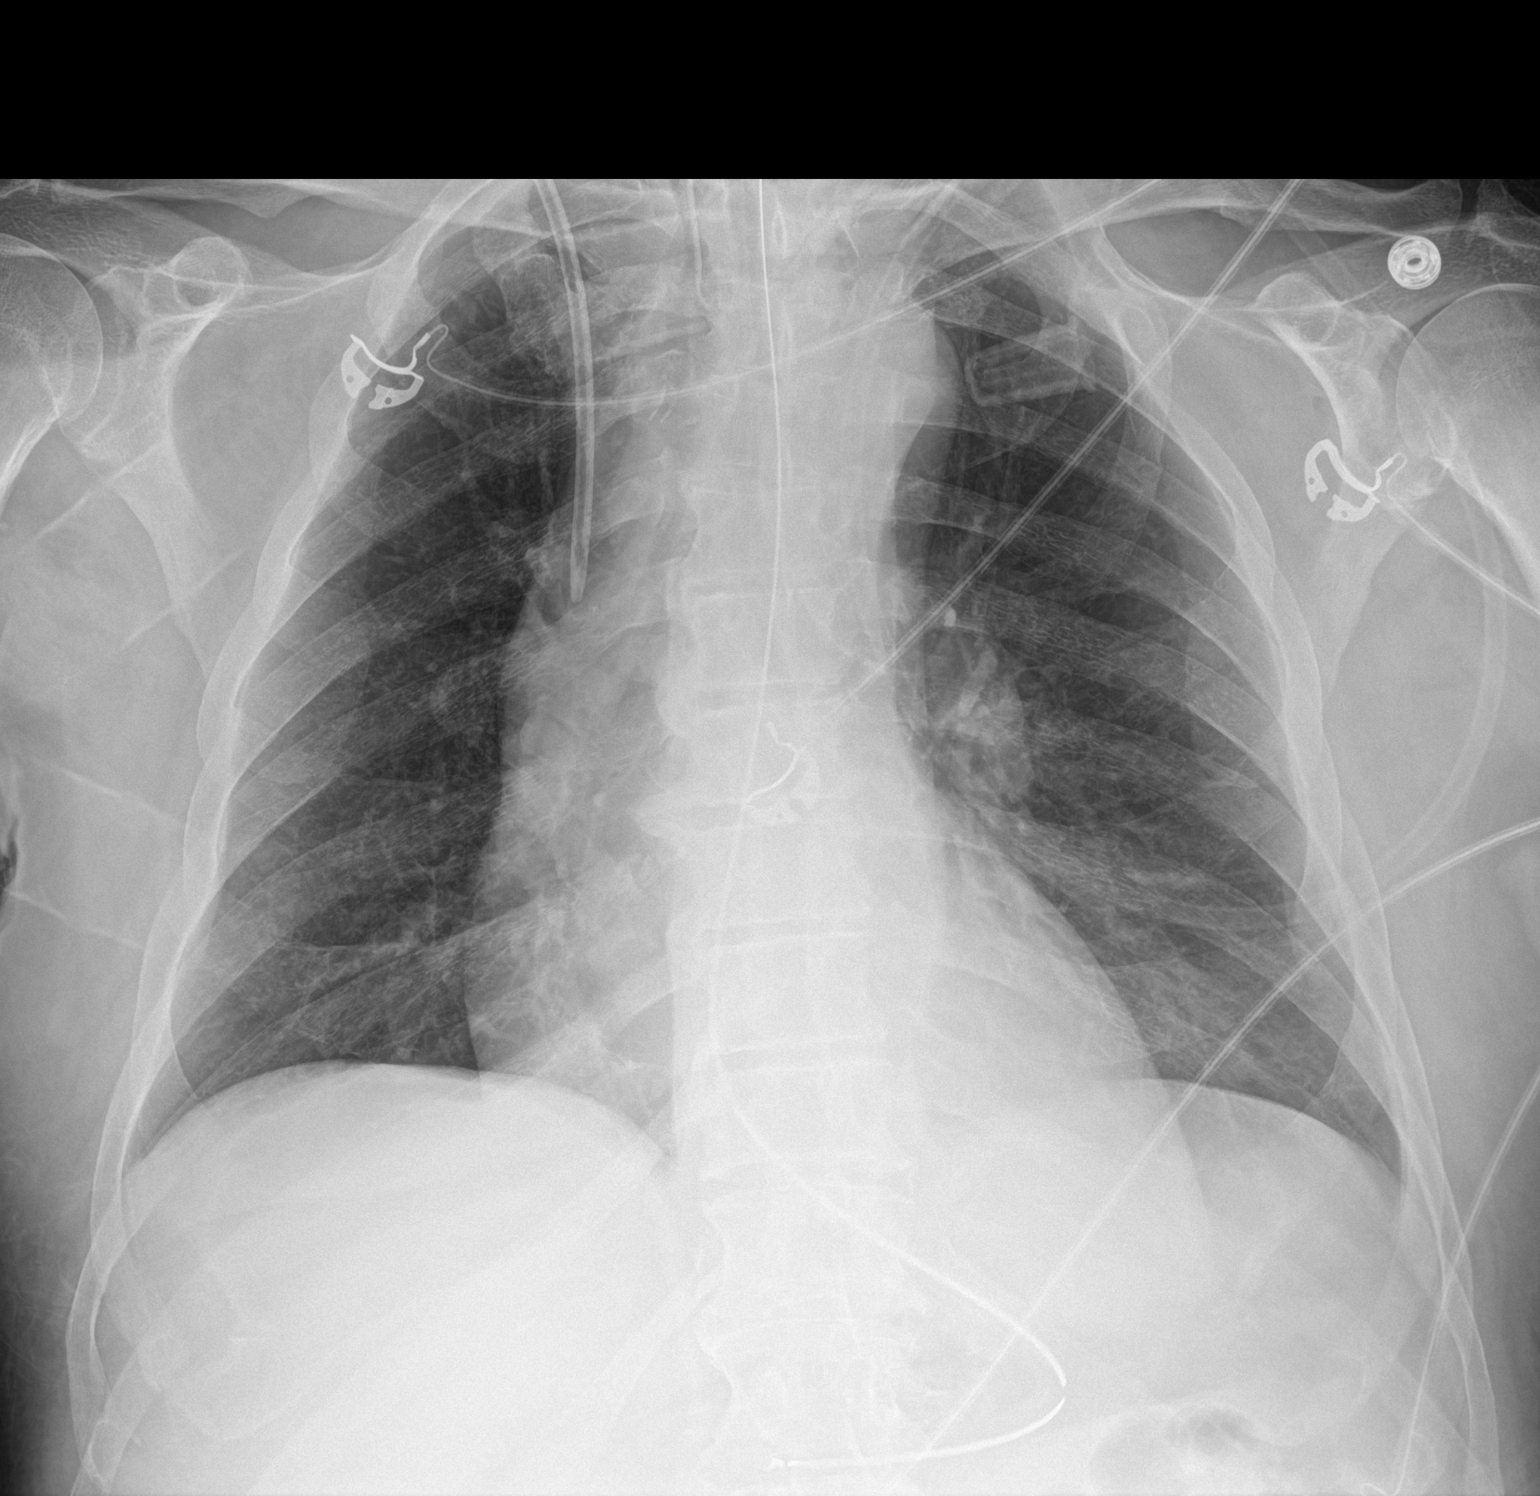

[1 of 1 positions shown; findings below may reference images not displayed]

FINDINGS: Single frontal view of the chest demonstrates endotracheal tube
overlying tracheal air column tip just below thoracic inlet. Enteric
catheter tip and side port project over the gastric body. Right
internal jugular catheter tip overlies superior vena cava. Cardiac
silhouette is stable. No airspace disease, effusion, or
pneumothorax. No acute bony abnormalities. Prior healed left rib
fractures.
IMPRESSION: 1. No complication after right internal jugular catheter placement.
Support devices as above.
2. No acute airspace disease.

## 2021-06-26 IMAGING — MR MR CERVICAL SPINE W/O CM
4 of 5 series · 17 of 48 positions shown · non-contrast
Comparison: CT of the cervical spine [DATE].

CLINICAL DATA: Myelopathy, acute or progressive.

EXAM:
MRI CERVICAL SPINE WITHOUT CONTRAST
TECHNIQUE: Multiplanar, multisequence MR imaging of the cervical spine was
performed. No intravenous contrast was administered.

[Series 3: T2 · sagittal · 3.0mm · 0.43mm/px · 5 of 17 slices shown (1 of 2)]
[im 1/17]
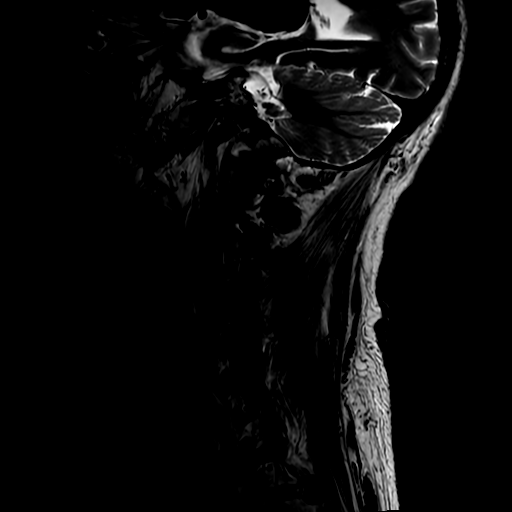
[im 5/17]
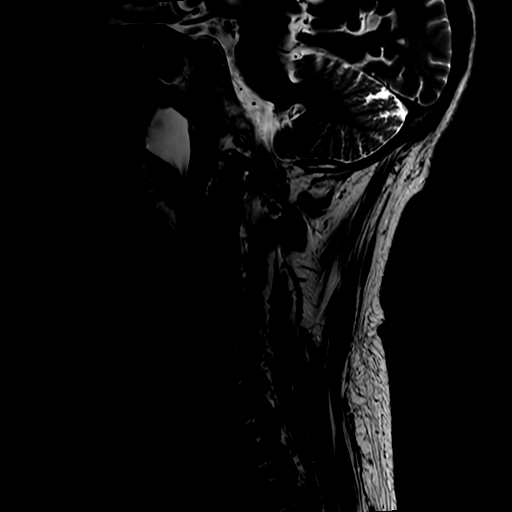
[im 9/17]
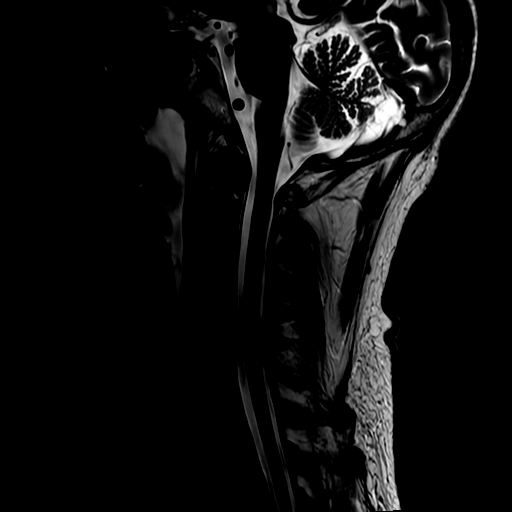
[im 13/17]
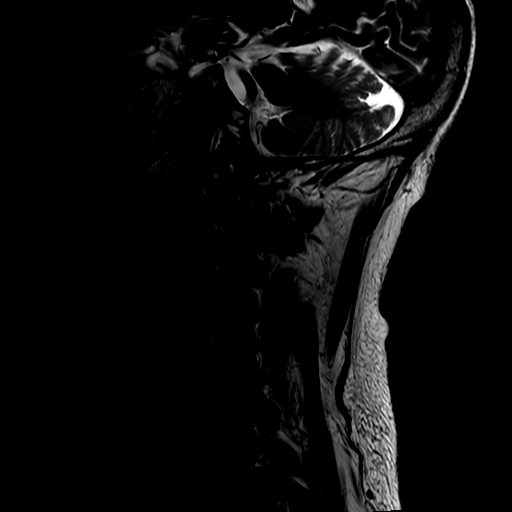
[im 17/17]
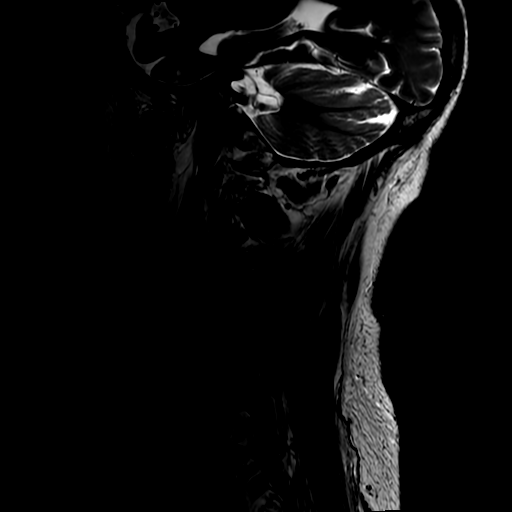

[Series 4: FLAIR · sagittal · 3.0mm · 0.43mm/px · 3 of 17 slices shown]
[im 1/17]
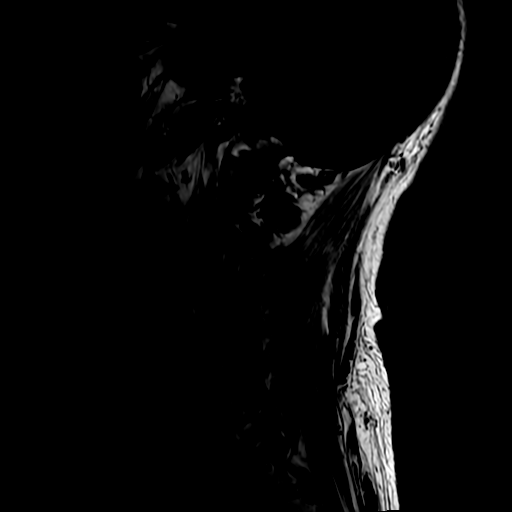
[im 11/17]
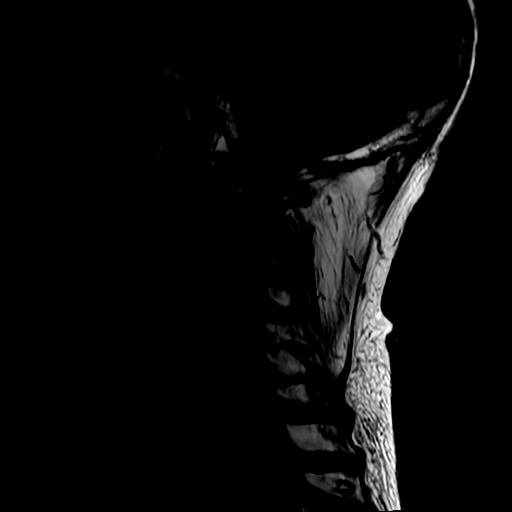
[im 17/17]
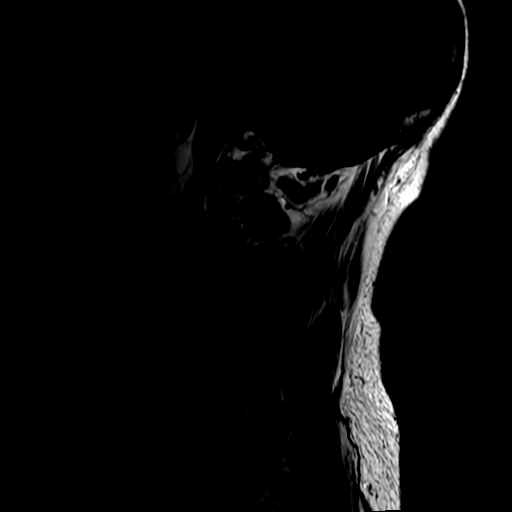

[Series 5: STIR · sagittal · 3.0mm · 0.43mm/px · 3 of 17 slices shown]
[im 1/17]
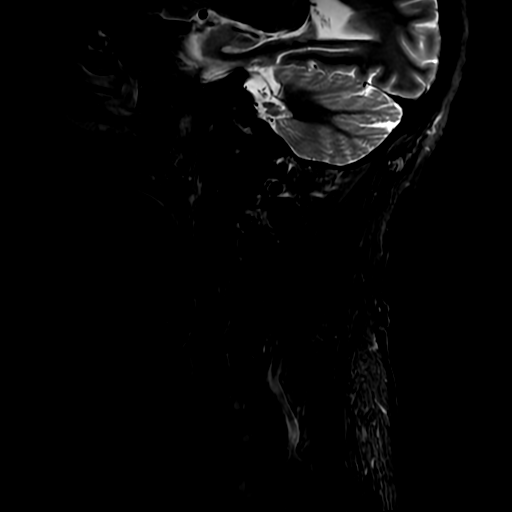
[im 11/17]
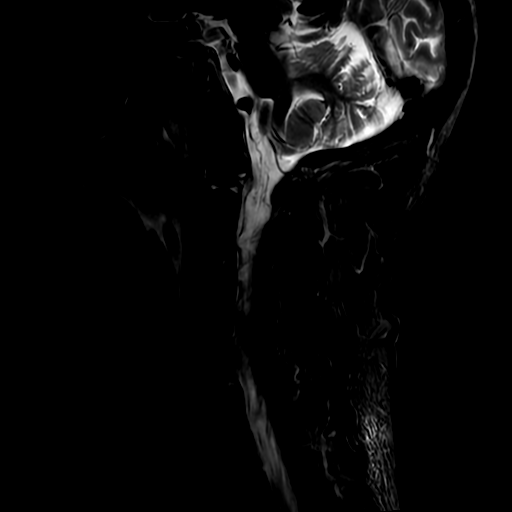
[im 17/17]
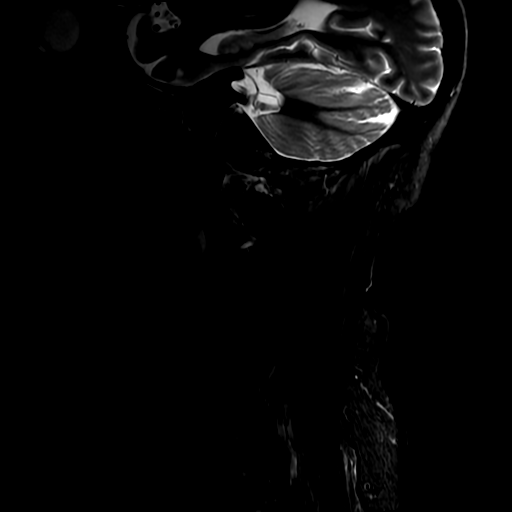

[Series 7: T2 · axial · 3.0mm · 0.35mm/px · z∈[-53,+39]mm · 6 of 35 slices shown (2 of 2)]
[im 1/35]
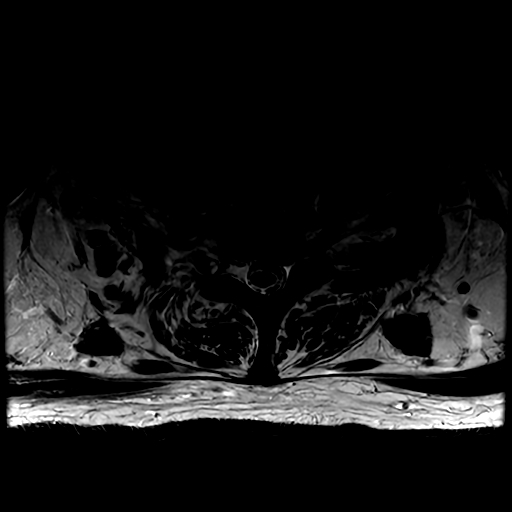
[im 5/35]
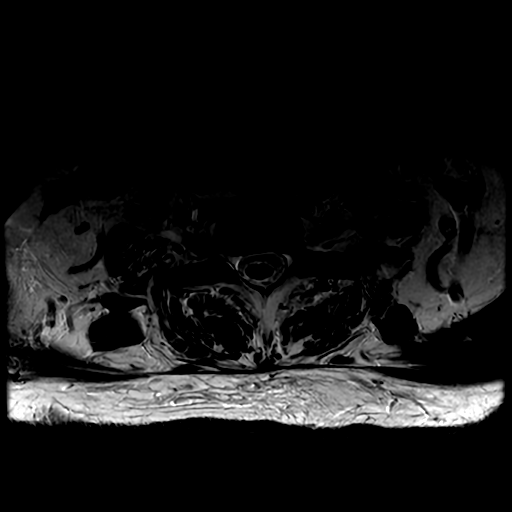
[im 9/35]
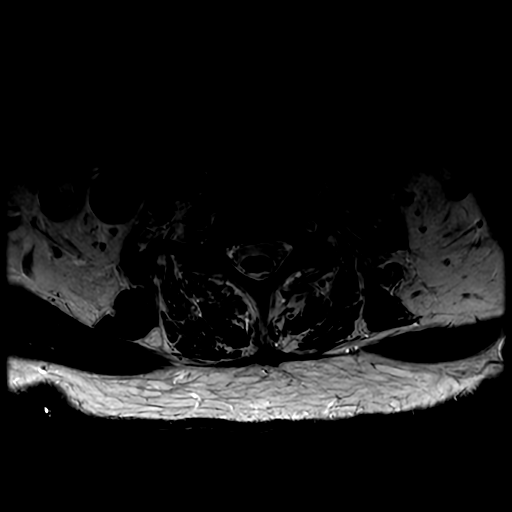
[im 13/35]
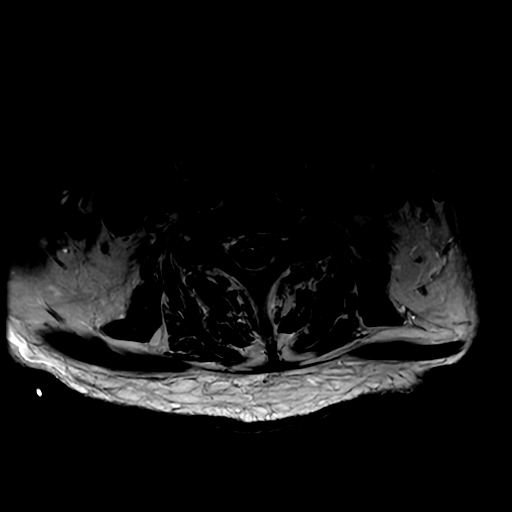
[im 18/35]
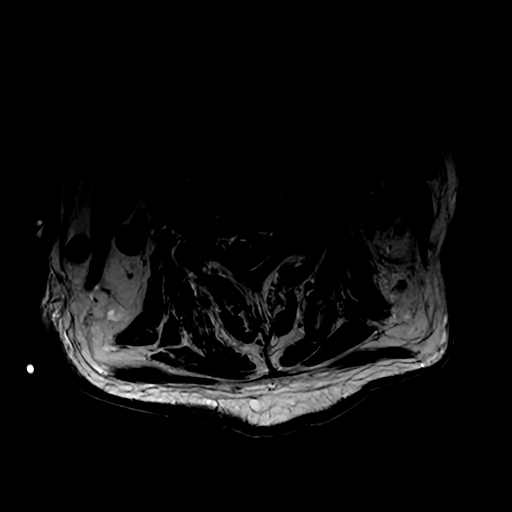
[im 30/35]
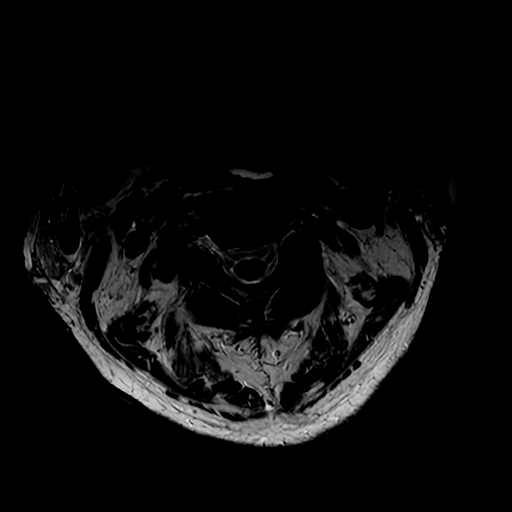

[17 of 48 positions shown; findings below may reference images not displayed]

FINDINGS: Mild intermittent motion degradation.

Alignment: Mild cervical dextrocurvature, possibly positional.
Straightening of the expected cervical lordosis. No significant
spondylolisthesis.

Vertebrae: Body height is maintained. No significant marrow edema or
focal suspicious osseous lesion. C3 vertebral body hemangioma.

Cord: No spinal cord signal abnormality is identified.

Posterior Fossa, vertebral arteries, paraspinal tissues:
Incompletely assessed T2 hyperintense focus within the posterior
aspect of the sella turcica (series 3, image 7). Secretions within
the nasopharynx and oropharynx. Partially visualized support tubes.
Flow voids preserved within the imaged cervical vertebral arteries.
Paraspinal soft tissues unremarkable.

Disc levels:

Moderate disc degeneration at C4-C5. No more than mild disc
degeneration at the remaining levels.

C2-C3: Small central disc protrusion. The disc protrusion results in
mild focal effacement of the ventral thecal sac (without spinal cord
mass effect). No significant foraminal stenosis.

C3-C4: Bilateral uncovertebral hypertrophy. No significant spinal
canal stenosis. Mild relative bilateral neural foraminal narrowing
(greater on the left).

C4-C5: Trace grade 1 retrolisthesis. Disc bulge with bilateral disc
osteophyte ridge/uncinate hypertrophy. Superimposed small central
disc protrusion. Mild facet arthrosis. Mild relative narrowing of
the spinal canal (without spinal cord mass effect). Moderate
bilateral neural foraminal narrowing.

C5-C6: Small central disc protrusion. Mild bilateral uncovertebral
hypertrophy. Mild facet arthrosis. The disc protrusion results in
mild focal effacement of the ventral thecal sac (without spinal cord
mass effect). Bilateral neural foraminal narrowing (mild right,
moderate left).

C6-C7: Uncovertebral hypertrophy on the left. Minimal facet
arthrosis. No significant disc herniation or spinal canal stenosis.
Mild relative left neural foraminal narrowing.

C7-T1: No significant disc herniation or stenosis.
IMPRESSION: Mildly motion degraded examination.

No appreciable signal abnormality within the cervical spinal cord.

Cervical spondylosis, as outlined. No more than mild spinal canal
narrowing (without spinal cord mass effect). Multilevel foraminal
stenosis, greatest bilaterally at C4-C5 (moderate) and on the left
at C5-C6 (moderate).

Disc degeneration is greatest at C4-C5 (moderate at this level).

Mild cervical dextrocurvature, possibly positional. Straightening of
the expected cervical lordosis. Trace C4-C5 grade 1 retrolisthesis.

Incompletely assessed T2 hyperintense focus within the posterior
aspect of the sella turcica. This may reflect a developmental cyst.
However, a pituitary adenoma cannot be excluded. Correlate with
relevant endocrinologic laboratory values. Additionally, a
contrast-enhanced brain MRI with pituitary protocol may be helpful
for further evaluation.

## 2021-06-26 IMAGING — MR MR THORACIC SPINE W/O CM
4 of 6 series · 18 of 48 positions shown · non-contrast
Comparison: Same-day MRI of the cervical spine.

CLINICAL DATA: Myelopathy, acute or progressive.

EXAM:
MRI THORACIC SPINE WITHOUT CONTRAST
TECHNIQUE: Multiplanar, multisequence MR imaging of the thoracic spine was
performed. No intravenous contrast was administered.

[Series 9: T1 · sagittal · 3.0mm · 0.90mm/px · 3 of 12 slices shown (1 of 2)]
[im 1/12]
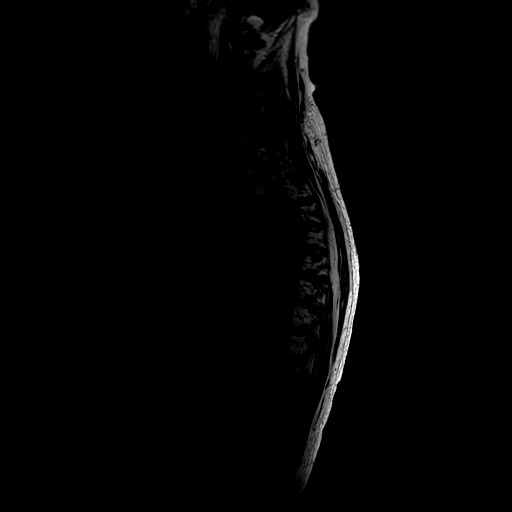
[im 8/12]
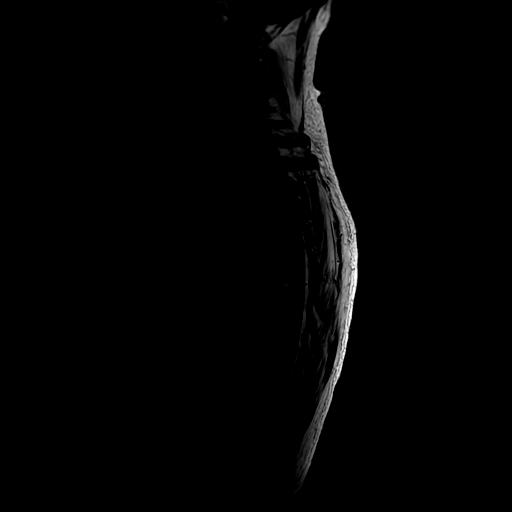
[im 12/12]
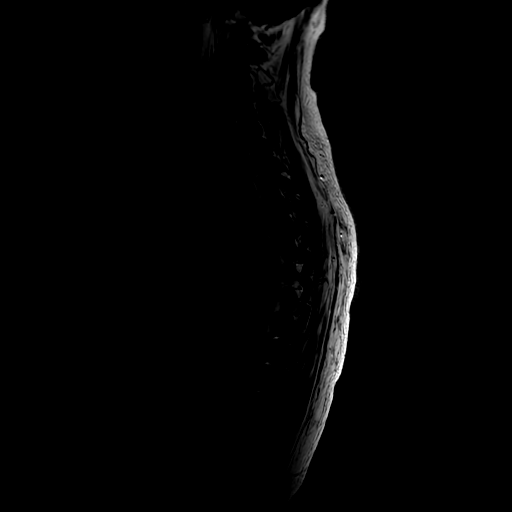

[Series 11: T2 · sagittal · 3.0mm · 0.66mm/px · 6 of 17 slices shown (1 of 2)]
[im 1/17]
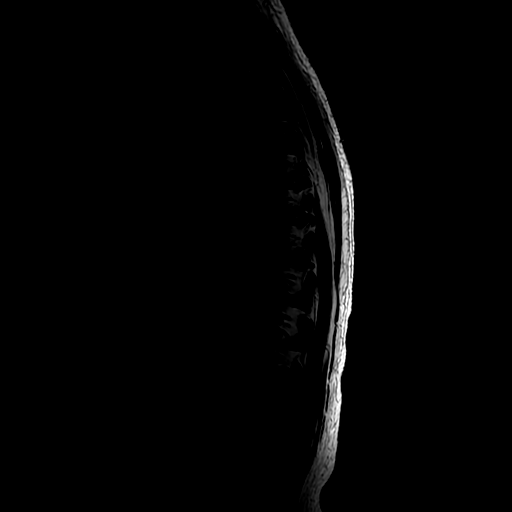
[im 4/17]
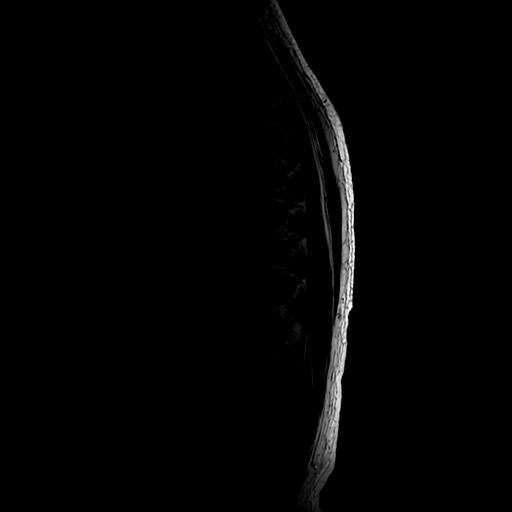
[im 7/17]
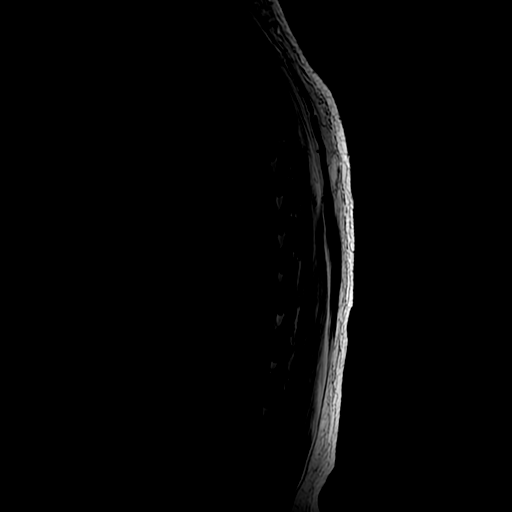
[im 10/17]
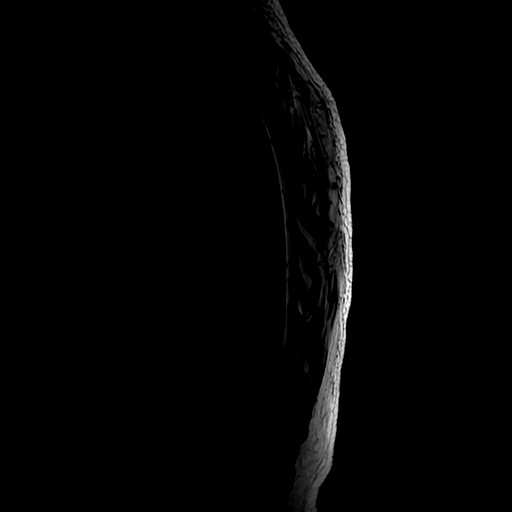
[im 13/17]
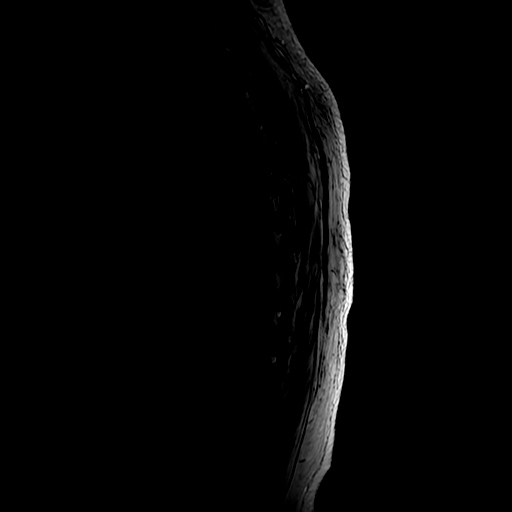
[im 17/17]
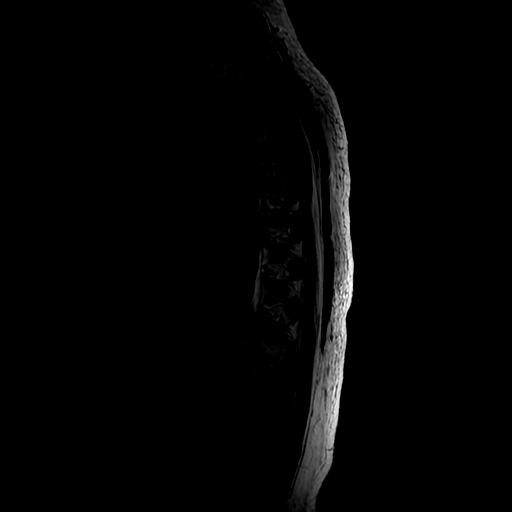

[Series 13: T1 · sagittal · 3.0mm · 0.66mm/px · 3 of 17 slices shown (2 of 2)]
[im 4/17]
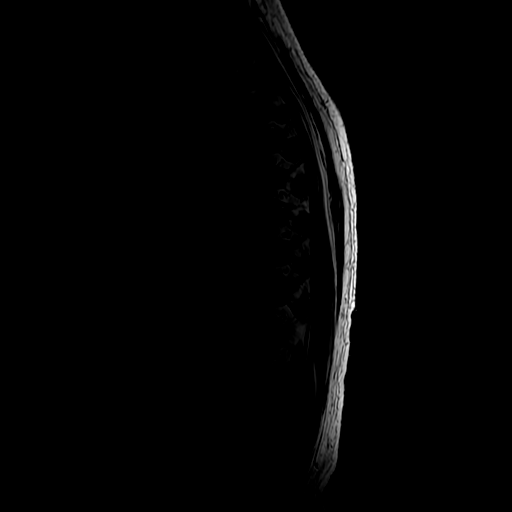
[im 10/17]
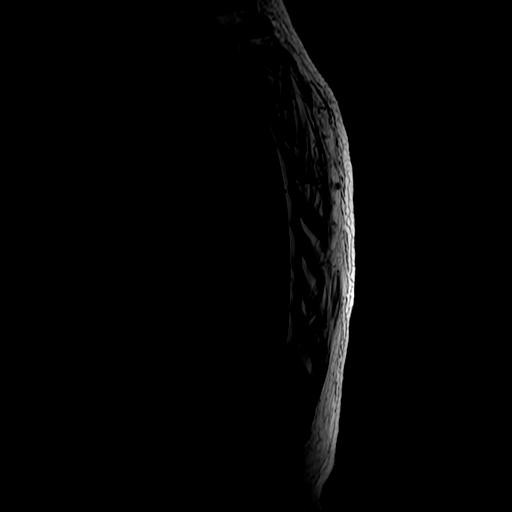
[im 17/17]
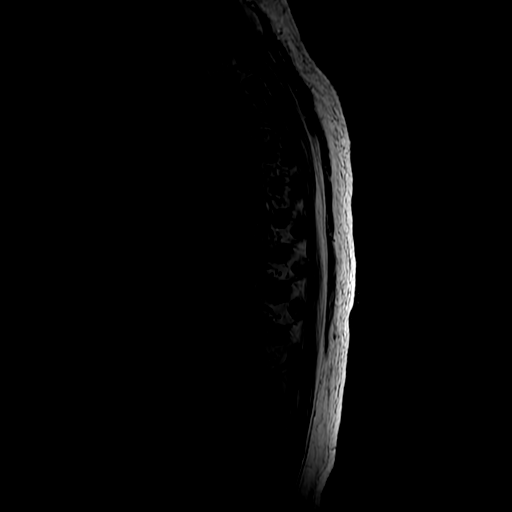

[Series 14: T2 · axial · 4.0mm · 0.39mm/px · z∈[-260,-80]mm · 6 of 39 slices shown (2 of 2)]
[im 1/39]
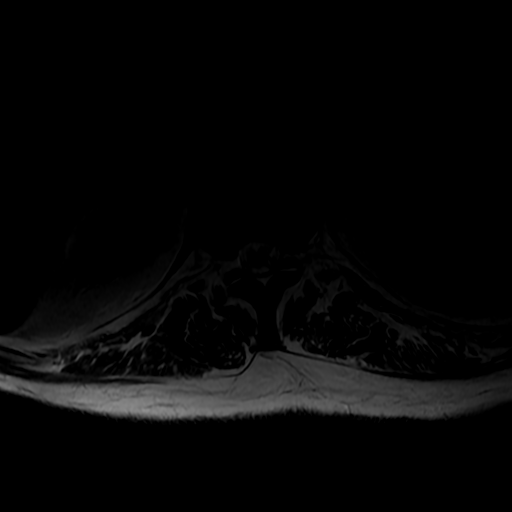
[im 7/39]
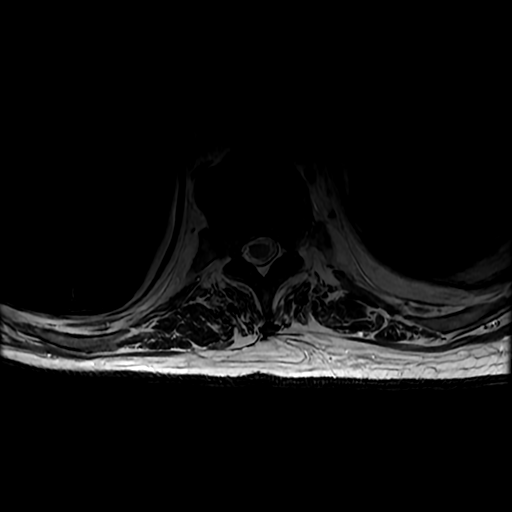
[im 13/39]
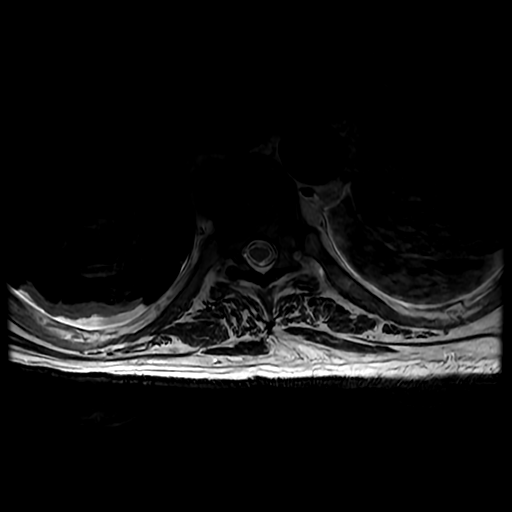
[im 16/39]
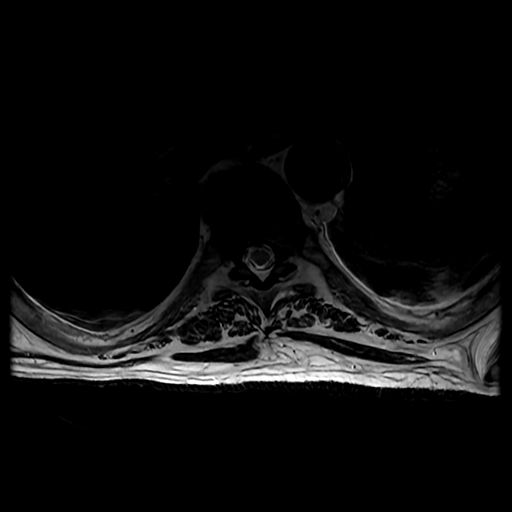
[im 20/39]
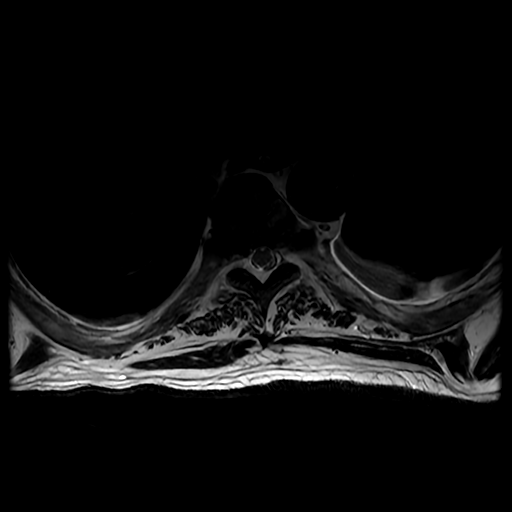
[im 32/39]
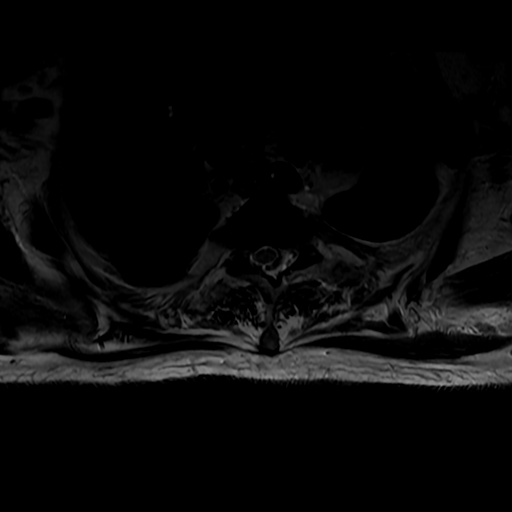

[18 of 48 positions shown; findings below may reference images not displayed]

FINDINGS: Intermittently motion degraded examination, limiting evaluation.
Most notably, there is moderate motion degradation of the axial T2
GRE sequence.

Alignment: Thoracic levocurvature. No significant spondylolisthesis.

Vertebrae: No vertebral compression fracture. Mild multilevel
degenerative endplate irregularity. No significant marrow edema or
focal suspicious osseous lesion. Multilevel vertebral body
hemangiomas.

Cord: Within the limitations of motion degradation, no signal
abnormality is identified within the spinal cord at the thoracic
levels

Paraspinal and other soft tissues: Bilateral pleural effusions.
Signal abnormality in the dependent aspect of the thorax
bilaterally, likely reflecting small effusions and mild atelectasis.
Small T2 hyperintense focus within the liver, incompletely
characterized but possibly reflecting a cyst. Paraspinal soft
tissues unremarkable.

Disc levels:

No more than mild disc degeneration within the thoracic spine. There
are shallow multilevel disc bulges. At T7-T8, there is a tiny
central disc protrusion without significant spinal canal stenosis
(series 14, image 23). At T9-T10, there is a small central disc
protrusion which focally effaces the ventral thecal sac resulting in
mild relative spinal canal narrowing (without spinal cord mass
effect) (series 14, image 28). No significant foraminal stenosis
within the thoracic spine.
IMPRESSION: Motion degraded examination, limiting evaluation.

Within the limitations of motion degradation, no signal abnormality
is identified within the spinal cord at the thoracic levels.

Thoracic spondylosis, as outlined. Most notably, small disc
protrusions at T7-T8 and T9-T10 result in no more than mild spinal
canal narrowing (without spinal cord mass effect). No significant
foraminal stenosis within the thoracic spine.

Thoracic levocurvature.

Signal abnormality within the dependent aspect of the thorax
bilaterally, likely reflecting small pleural effusions and/or mild
dependent atelectasis.

## 2021-06-26 IMAGING — DX DG CHEST 1V PORT
1 series · 1 of 1 positions shown · non-contrast
Comparison: [DATE]

CLINICAL DATA: Respiratory distress

EXAM:
PORTABLE CHEST 1 VIEW

[chest ap]
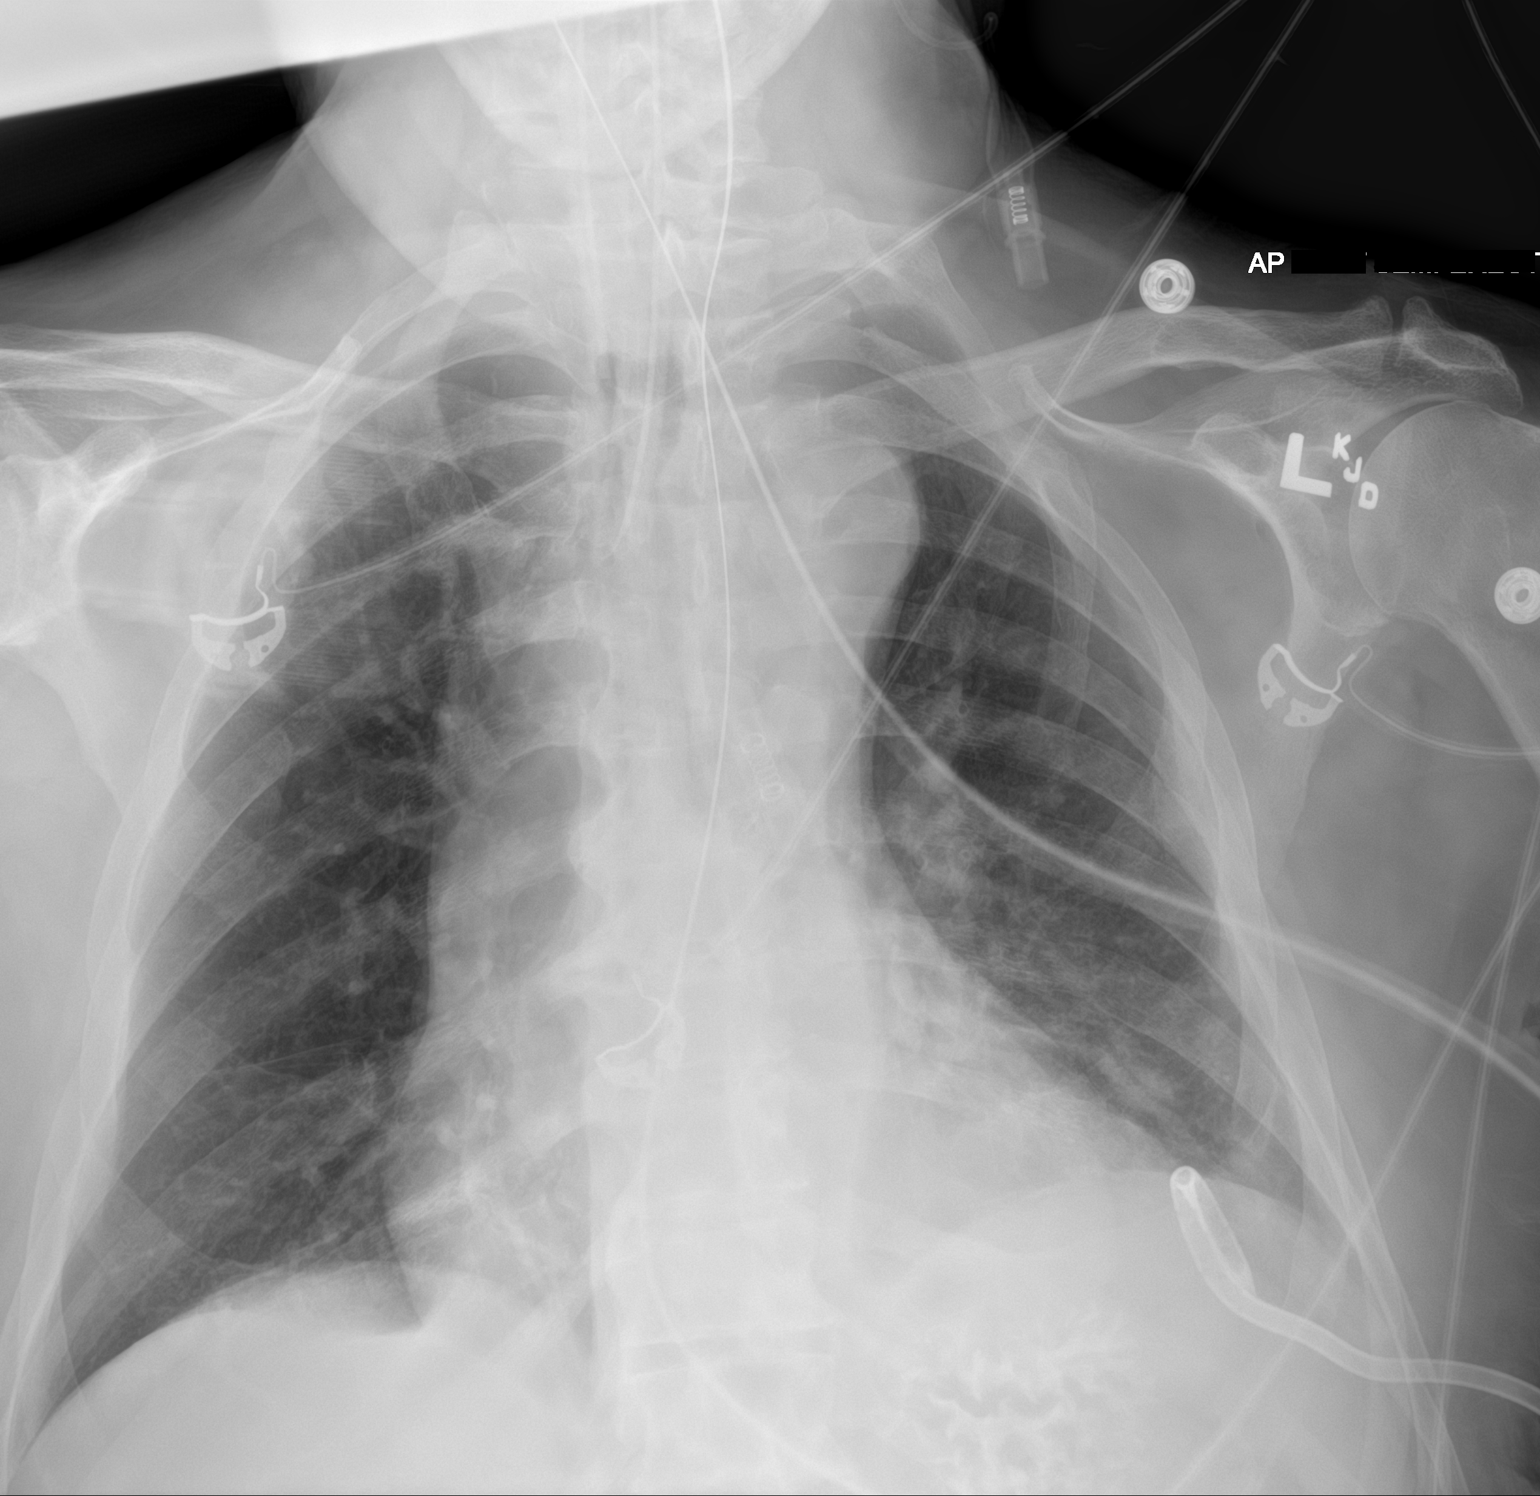

[1 of 1 positions shown; findings below may reference images not displayed]

FINDINGS: Endotracheal tube is seen roughly 6 cm above the carina. Nasogastric
tube extends into the upper abdomen beyond the margin of the
examination. Left basilar pigtail chest tube is unchanged.

Left basilar consolidation has improved with mild residual
infiltrate noted. Right lung is clear. No pneumothorax or pleural
effusion. Mild cardiomegaly is stable. Pulmonary vascularity is
normal.
IMPRESSION: Stable support tubes.

Left basilar chest tube in place.  No pneumothorax.

Improving left basilar consolidation.

Stable cardiomegaly.

## 2021-06-26 MED ORDER — HEPARIN SODIUM (PORCINE) 1000 UNIT/ML DIALYSIS
1000.0000 [IU] | INTRAMUSCULAR | Status: DC | PRN
  Administered 2021-06-27 – 2021-06-30 (×2): 2400 [IU] via INTRAVENOUS_CENTRAL
  Filled 2021-06-26: qty 6
  Filled 2021-06-26: qty 2
  Filled 2021-06-26: qty 4
  Filled 2021-06-26: qty 6
  Filled 2021-06-26: qty 4
  Filled 2021-06-26: qty 6

## 2021-06-26 MED ORDER — LIDOCAINE HCL (PF) 1 % IJ SOLN
INTRAMUSCULAR | Status: AC
  Filled 2021-06-26: qty 5

## 2021-06-26 MED ORDER — AMLODIPINE BESYLATE 10 MG PO TABS
10.0000 mg | ORAL_TABLET | Freq: Every day | ORAL | Status: DC
  Administered 2021-06-26 – 2021-06-27 (×2): 10 mg
  Filled 2021-06-26 (×2): qty 1

## 2021-06-26 MED ORDER — MIDAZOLAM HCL 2 MG/2ML IJ SOLN
INTRAMUSCULAR | Status: AC
  Administered 2021-06-26: 2 mg via INTRAVENOUS
  Filled 2021-06-26: qty 2

## 2021-06-26 MED ORDER — MIDAZOLAM HCL 2 MG/2ML IJ SOLN
2.0000 mg | INTRAMUSCULAR | Status: DC | PRN
  Administered 2021-06-28 (×3): 2 mg via INTRAVENOUS
  Filled 2021-06-26 (×4): qty 2

## 2021-06-26 MED ORDER — SODIUM ZIRCONIUM CYCLOSILICATE 10 G PO PACK
10.0000 g | PACK | Freq: Once | ORAL | Status: AC
  Administered 2021-06-26: 10 g
  Filled 2021-06-26: qty 1

## 2021-06-26 MED ORDER — NOREPINEPHRINE 4 MG/250ML-% IV SOLN
INTRAVENOUS | Status: AC
  Administered 2021-06-26: 2 ug/min via INTRAVENOUS
  Filled 2021-06-26: qty 250

## 2021-06-26 MED ORDER — PRISMASOL BGK 4/2.5 32-4-2.5 MEQ/L REPLACEMENT SOLN: Status: DC

## 2021-06-26 MED ORDER — PRISMASOL BGK 4/2.5 32-4-2.5 MEQ/L EC SOLN
Status: DC
Start: 2021-06-26 — End: 2021-06-30

## 2021-06-26 MED ORDER — NOREPINEPHRINE 4 MG/250ML-% IV SOLN
0.0000 ug/min | INTRAVENOUS | Status: DC
  Administered 2021-06-26: 2 ug/min via INTRAVENOUS
  Administered 2021-06-28: 5 ug/min via INTRAVENOUS
  Administered 2021-06-28: 11 ug/min via INTRAVENOUS
  Administered 2021-06-29: 2 ug/min via INTRAVENOUS
  Filled 2021-06-26 (×3): qty 250

## 2021-06-26 MED ORDER — FENTANYL CITRATE PF 50 MCG/ML IJ SOSY
PREFILLED_SYRINGE | INTRAMUSCULAR | Status: AC
  Administered 2021-06-26: 50 ug via INTRAVENOUS
  Filled 2021-06-26: qty 2

## 2021-06-26 MED ORDER — MIDAZOLAM HCL 2 MG/2ML IJ SOLN: 2.0000 mg | Freq: Once | INTRAMUSCULAR | Status: AC

## 2021-06-26 MED ORDER — FENTANYL CITRATE PF 50 MCG/ML IJ SOSY: 50.0000 ug | PREFILLED_SYRINGE | Freq: Once | INTRAMUSCULAR | Status: AC

## 2021-06-26 NOTE — Progress Notes (Signed)
NAME:  Scott Woods, MRN:  [id number], DOB:  [birth year], LOS: 9 ADMISSION DATE:  06/04/2021, CONSULTATION DATE:  06/22/2021 REFERRING MD:  Code team CHIEF COMPLAINT:  PEA arrest   History of Present Illness:  70 year old man initially admitted to 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 PMHx significant for HTN, HLD, T2DM, CKD stage IV, history of Hepatitis B, adrenal nodule, prostate CA.  On admission, patient denied any preceding symptoms prior to syncopal episode. CT Head was negative. Labs demonstrated Cr 6.2 (baseline 2.7) and K 6.3.  Hyperkalemia improved with Lokelma and insulin.  He was admitted to the hospitalist service.  Overnight 10/4, patient became agitated and hypertensive and was given Ativan and hydralazine.  A short time later he suffered a PEA arrest. Code Blue was called and ROSC obtained after 20 minutes CPR. Patient was intubated with post-intubation CXR showing L pneumothorax. Chest tube was placed by CCM.  Transferred to CVICU for further care.  Pertinent Medical History:   Past Medical History:  Diagnosis Date   Adrenal nodule ([place])    Blood in the urine 05/08/2015   Borderline diabetes    Chronic kidney disease    CKD (chronic kidney disease), stage II    Diabetes mellitus without complication (HCC)    Elevated PSA    Gross hematuria    H/O type B viral hepatitis 05/08/2015   When he was a child    History of hepatitis B    HTN (hypertension)    Hyperlipidemia with target LDL less than 100    Obesity    Pre-ulcerative calluses 03/28/2016   great toe    Prostate cancer ([place])    Significant Hospital Events: Including procedures, antibiotic start and stop dates in addition to other pertinent events   9/29 admitted to hospitalist service 10/4 PEA arrest, intubation, transfer to [place], left chest tube placed for post-code PTX. LTM EEG, c/f myoclonus. CT Head NAICA. Bedside TEE negative for dissection. 10/5 sedation turned  off 10/6 intermittently hypertensive, remains on LTM, sCr 5.4-> 5.8 but still making urine, remains off sedation, weaning on PSV 10/5 10/7 LTM d/c'd, stable sCr, improving UOP, remains off sedation, weaning  Interim History / Subjective:   Overnight with labored breathing on PSV, diaphoresis and LUE tremor vs seizure, given versed. Switched to SIMV+PS  Remains afebrile Increasing UOP, sCr 5.95-> 5.56 but BUN 56-> 61  Objective:  Blood pressure (!) 149/85, pulse (!) 109, temperature 98.8 F (37.1 C), temperature source Oral, resp. rate 20, height 5\' 8"  (1.727 m), weight 74 kg, SpO2 100 %.    Vent Mode: SIMV;PRVC;PSV FiO2 (%):  [30 %] 30 % Set Rate:  [6 bmp] 6 bmp Vt Set:  [500 mL] 500 mL PEEP:  [5 cmH20] 5 cmH20 Pressure Support:  [10 cmH20] 10 cmH20 Plateau Pressure:  [13 cmH20-15 cmH20] 13 cmH20   Intake/Output Summary (Last 24 hours) at 06/26/2021 0806 Last data filed at 06/26/2021 0700 Gross per 24 hour  Intake 460 ml  Output 1345 ml  Net -885 ml   Filed Weights   06/24/21 0500 06/25/21 0500 06/26/21 0500  Weight: 73.5 kg 69.3 kg 74 kg   Physical Examination: General:  elderly male lying in bed, ill appearing HEENT: MM pink/moist, pupils 4/reactive, down ward gaze Neuro: Minimal response to noxious stimuli, some movement in Ue's, LUE tremor noted with some ridigity, LE's flaccid  CV: ST, rr PULM:  non labored on MV, scattered rhonchi, scant secretions, left  pigtail to waterseal, no airleak/ tidaling GI: soft, bs+, ND, foley, flexiseal Extremities: warm/dry, trace generalized edema  Skin: no rashes  CXR 10/8 am with no pneumothorax, stable L basilar consolidation UOP 760-> 1040-> 1265ml/ 24hr Labs reviewed   Resolved Hospital Problem List:   Hyperkalemia  Assessment & Plan:   Witnessed in-hospital PEA arrest, unclear etiology 20 minutes ACLS before ROSC, currently unclear etiology. K WNL prior to code. PE considered, but unlikely as Echo 10/2 without e/o R heart  strain. - S/p TEE 10/4 without evidence of dissection - continue supportive care - etiology remains unclear at this time  Acute hypoxic respiratory failure 2/2 PEA arrest Left Pneumothorax - secondary to CPR - continue SIMV+PS, ABG post vent change ok  - can continue daily SBT trials, day 4/x ETT, hopefully once mental status improves can move toward extubation - VAP/ PPI - pigtail placed to waterseal 10/7, am CXR w/o PTX.  Will remove today, repeat CXR today at 2000 r/o recurrent ptx  - ongoing bronchial hygiene   Acute encephalopathy post-PEA arrest, concern for anoxic brain injury - appreciate Neurology input - LTM with no definite seizures, d/c'd 10/7 - MRI brain  10/7 without acute process  - Maintain neuro protective measures; goal for eurothermia, euglycemia, eunatermia, normoxia, and PCO2 goal of 35-40 -  off sedation since 10/5 but received versed overnight 10/7.  Minimize as able - tremor not felt to be seizure, possibly related to uremia.  Plan to start CRRT today to see if will help with encephalopathy - consider LP later this afternoon for completeness. Will send CBC and coags now.   - son did mention on the phone 10/7 that family believes he has had a mental decline over the last year or two questioning some dementia.  He was recommended to be placed in assisted living before this but refused.  Son also states that there is strange trend with men in the family who have passed away for unknown reasons, ?some neurologic condition, yet to be understood.   Normocytic Anemia, last transfused 10/4, 1u PRBC - trend CBC, transfuse <7 - s/p iron replete   Acute kidney injury on chronic CKD stage IV, non-oliguric BPH NAGMA - Nephrology following, appreciate input - myeloma/ amyloid workup unrevealing  - plans to start CRRT when we can obtain consent from family, no UF as UOP is picking up - Trend BMP, strict I/Os, daily wts - Replace electrolytes as indicated - Avoid  nephrotoxic agents, ensure adequate renal perfusion - F/u UPEP and complement studies   Hypertension - progressive hypertensive, increase norvasc 5-> 10mg , hold lisinopril given AKI  Best Practice: (right click and "Reselect all SmartList Selections" daily)   Diet/type: NPO; TF  DVT prophylaxis: prophylactic heparin  GI prophylaxis: PPI Lines: N/A Foley:  N/A Code Status:  full code Last date of multidisciplinary goals of care discussion: pending 10/8   CCT:30 mins   Kennieth Rad, ACNP [facility] 06/26/2021, 8:06 AM  See Amion for pager If no response to pager, please call PCCM consult pager After 7:00 pm call Elink

## 2021-06-26 NOTE — Procedures (Signed)
Lumbar Puncture Procedure Note  Scott Woods  [id number]  1950-10-04  Date:06/26/21  Time:3:02 PM   Provider Performing:Raysean Graumann   Procedure: Lumbar Puncture (66294)  Indication(s) Rule out meningitis  Consent Risks of the procedure as well as the alternatives and risks of each were explained to the patient and/or caregiver.  Consent for the procedure was obtained and is signed in the bedside chart  Anesthesia Topical only with 1% lidocaine    Time Out Verified patient identification, verified procedure, site/side was marked, verified correct patient position, special equipment/implants available, medications/allergies/relevant history reviewed, required imaging and test results available.   Sterile Technique Maximal sterile technique including sterile barrier drape, hand hygiene, sterile gown, sterile gloves, mask, hair covering.    Procedure Description Using palpation, approximate location of L3-L4 space identified.   Lidocaine used to anesthetize skin and subcutaneous tissue overlying this area.  A 20g spinal needle was then used to access the subarachnoid space. Opening pressure:Not obtained. Closing pressure:Not obtained. 8 cc clear CSF obtained.  Complications/Tolerance None; patient tolerated the procedure well.   EBL Minimal   Specimen(s) CSF

## 2021-06-26 NOTE — Progress Notes (Signed)
eLink Physician-Brief Progress Note Patient Name: Scott Woods DOB: [birth year] MRN: [id number]   Date of Service  06/26/2021  HPI/Events of Note  Hyperkalemia - K+ = 5.3.  eICU Interventions  Plan: Lokelma 10 gm per tube now. Repeat BMP at 9 AM.     Intervention Category Major Interventions: Electrolyte abnormality - evaluation and management  [name] 06/26/2021, 2:05 AM

## 2021-06-26 NOTE — Progress Notes (Signed)
eLink Physician-Brief Progress Note Patient Name: Scott Woods DOB: [birth year] MRN: [id number]   Date of Service  06/26/2021  HPI/Events of Note  Patient became diaphoretic and had labored breathing on PSV. Changed to SIMV + PS mode by RT and looks more comfortable.  eICU Interventions  Plan: Change ventilator settings to 30%/SIMV 6/TV 500/PS 10/P 5. ABG at 3:30 AM. Portable CXR STAT.     Intervention Category Major Interventions: Respiratory failure - evaluation and management  Herve Haug [place] 06/26/2021, 2:23 AM

## 2021-06-26 NOTE — Progress Notes (Signed)
Nephrology Follow-Up Consult note   Assessment/Recommendations: Scott Woods is a/an 70 y.o. male with a past medical history significant for HTN, CAD, DM2, PVD, BPH, CKDa who present w/ fall and AKI on CKD 3a    Non-Oliguric AKI on CKD 3a: Baseline Cr 1.5 as of early 2022 then with severe AKI in May thought to be dehydration and/or obstruction now again with severe AKI (though unknown if had returned to baseline renal function after AKI).  Serologies have been unrevealing (c/f myeloma/amyloid mainly) and it looks like this is likely hemodynamic insult leading to AKI initially then potentiated by his PEA arrest with another hemodynamic insult.   -No emergent need for RRT but in light of ongoing AMS of no clear etiology will proceed to RRT in case this represents uremic encephalopathy though that would be unusual with BUN << 100.  Have d/w primary team and 2H staff --> will start CRRT after line placement.  I called NOK for consent (son) but no answer so will need to obtain consent prior to proceeding with this plan.  -Long term need for dialysis or plans are unclear at this time --> pending course; still hopeful he could have renal recovery -No UF with HD - making good UOP and volume status seems ok right now. -Continue to monitor daily Cr, Dose meds for GFR -Monitor Daily I/Os, Daily weight  -Maintain MAP>65 for optimal renal perfusion.  -Avoid nephrotoxic medications including NSAIDs and Vanc/Zosyn combo -Hold lisinopril  PEA arrest: TEE no dissection, EEG no seizure.   Encephalopathy: following PEA arrest - neuro following; no seizure on EEG. 10/7 MRI unrevealing.  ?uremia - per above RRT today.   Anemia: Worsening normocytic anemia with Hb now 9s, was 10.9 in 01/2021.  Iron is low will giving IV iron.  Myeloma w/u per above unrevealing to date.   Metabolic acidosis: Bicarb 20, cont supplemental bicarb  Hypertension: Hold lisinopril. Has hydral PRN but BPs have been low intermittently    HLD: On statin   BPH: Continue home meds as tolerated  Will follow - call with concerns.    [facility] 06/26/2021 10:56 AM  ___________________________________________________________  CC: AKI  Interval History/Subjective:  No major events through yesterday.  Remains intubated, encephalopathic and not requiring sedation on vent. Rouses to noxious stim only.  UOP 1433m yesterday, neg 629myest, +2.7 for admit . Labs this AM BUN 42 > 49>56>63, Cr 5.4 >5.8> 6> 5.6, bicarb 20.   Medications:  Current Facility-Administered Medications  Medication Dose Route Frequency Provider Last Rate Last Admin   0.9 %  sodium chloride infusion  250 mL Intravenous Continuous Ogan, OkKerry KassMD       acetaminophen (TYLENOL) tablet 650 mg  650 mg Oral Q4H Gleason, LaOtilio CarpenPA-C       Or   acetaminophen (TYLENOL) 160 MG/5ML solution 650 mg  650 mg Per Tube Q4H Gleason, LaOtilio CarpenPA-C   650 mg at 06/26/21 [id number] Or   acetaminophen (TYLENOL) suppository 650 mg  650 mg Rectal Q4H Gleason, LaOtilio CarpenPA-C       acetaminophen (TYLENOL) tablet 650 mg  650 mg Oral Q6H PRN Howerter, Justin B, DO       Or   acetaminophen (TYLENOL) suppository 650 mg  650 mg Rectal Q6H PRN Howerter, Justin B, DO       albuterol (PROVENTIL) (2.5 MG/3ML) 0.083% nebulizer solution 2.5 mg  2.5 mg Nebulization Q4H PRN Icard, BrOctavio Graves  DO       amLODipine (NORVASC) tablet 10 mg  10 mg Per Tube Daily Jennelle Human B, NP   10 mg at 06/26/21 0943   atorvastatin (LIPITOR) tablet 40 mg  40 mg Per Tube QHS Candee Furbish, MD   40 mg at 06/25/21 2112   bethanechol (URECHOLINE) tablet 10 mg  10 mg Per Tube TID Candee Furbish, MD   10 mg at 06/26/21 0943   chlorhexidine gluconate (MEDLINE KIT) (PERIDEX) 0.12 % solution 15 mL  15 mL Mouth Rinse BID Gleason, Otilio Carpen, PA-C   15 mL at 06/26/21 0855   Chlorhexidine Gluconate Cloth 2 % PADS 6 each  6 each Topical Daily Mannam, Praveen, MD   6 each at 06/26/21  0944   docusate (COLACE) 50 MG/5ML liquid 100 mg  100 mg Per Tube BID Gleason, Otilio Carpen, PA-C   100 mg at 06/26/21 0943   feeding supplement (PROSource TF) liquid 45 mL  45 mL Per Tube Daily Candee Furbish, MD   45 mL at 06/26/21 0943   feeding supplement (VITAL 1.5 CAL) liquid 1,000 mL  1,000 mL Per Tube Continuous Candee Furbish, MD 50 mL/hr at 06/26/21 0800 Infusion Verify at 06/26/21 0800   heparin injection 5,000 Units  5,000 Units Subcutaneous Q8H Candee Furbish, MD   5,000 Units at 06/26/21 0350   insulin aspart (novoLOG) injection 0-9 Units  0-9 Units Subcutaneous Q4H Candee Furbish, MD   2 Units at 06/26/21 0938   lidocaine-EPINEPHrine (XYLOCAINE W/EPI) 1 %-1:100000 (with pres) injection 30 mL  30 mL Other Once Mannam, Hart Robinsons, MD       MEDLINE mouth rinse  15 mL Mouth Rinse 10 times per day Gleason, Otilio Carpen, PA-C   15 mL at 06/26/21 0944   midazolam (VERSED) injection 2 mg  2 mg Intravenous Q2H PRN Anders Simmonds, MD       ondansetron [facility]) injection 4 mg  4 mg Intravenous Q6H PRN Gleason, Otilio Carpen, PA-C       pantoprazole (PROTONIX) injection 40 mg  40 mg Intravenous Daily Gleason, Otilio Carpen, PA-C   40 mg at 06/26/21 0943   polyethylene glycol (MIRALAX / GLYCOLAX) packet 17 g  17 g Per Tube Daily Gleason, Otilio Carpen, PA-C   17 g at 06/24/21 0931   sodium bicarbonate tablet 1,300 mg  1,300 mg Per Tube BID Candee Furbish, MD   1,300 mg at 06/26/21 0943   sodium chloride flush (NS) 0.9 % injection 10 mL  10 mL Other Q8H Jennelle Human B, NP   10 mL at 06/26/21 1829      Review of Systems: 10 systems reviewed and negative except per interval history/subjective  Physical Exam: Vitals:   06/26/21 0744 06/26/21 0800  BP:  137/84  Pulse:    Resp:  15  Temp: 98.8 F (37.1 C)   SpO2:  100%   Total I/O In: 30 [Other:30] Out: 75 [Urine:75]  Intake/Output Summary (Last 24 hours) at 06/26/2021 1056 Last data filed at 06/26/2021 0800 Gross per 24 hour  Intake 390 ml  Output 1415 ml   Net -1025 ml    Constitutional: intubated  ENMT: ETT in place CV: normal rate, no edema Respiratory: clear BL Gastrointestinal: soft, non-tender Skin: no visible lesions or rashes GU: condom catheter with clear yellow urine Neuro: on vent unsedated, will arouse and withdraw to painful stimulus per RN, he didn't arouse to shoulder shake and calling voice  loudly.   Test Results I personally reviewed new and old clinical labs and radiology tests Lab Results  Component Value Date   NA 145 06/26/2021   K 4.9 06/26/2021   CL 115 (H) 06/26/2021   CO2 20 (L) 06/26/2021   BUN 63 (H) 06/26/2021   CREATININE 5.58 (H) 06/26/2021   CALCIUM 8.8 (L) 06/26/2021   ALBUMIN 2.4 (L) 06/26/2021   PHOS 5.9 (H) 06/26/2021

## 2021-06-26 NOTE — Progress Notes (Signed)
Neurology Progress Note  Brief HPI: 70 y.o. male with PMHx of stage IV CKD, HtN, HLD, chronic anemia who presented to the ED on 9/29 following a syncopal episode at a volleyball game with urinary incontinence but without seizure-like activity.  CT head was negative but patient was found to have an AKI.  On 10/4 patient was unresponsive without a pulse and CPR was initiated with ROSC obtained after 20 minutes of CPR.  Post intubation chest x-ray showed left pneumothorax for which chest tube was placed.  Patient was noted to have intermittent right > left upper extremity twitching and neurology was consulted for further evaluation and LTM EEG was initiated.  Subjective: No acute overnight events  Exam: Vitals:   06/26/21 1104 06/26/21 1132  BP:    Pulse: 93   Resp: 17   Temp:  99.3 F (37.4 C)  SpO2: 100%    Gen: Intubated, not sedated in the ICU Eyes: Left > right scleral edema Resp:respirations assisted via mechanical ventilator but with spontaneous respirations over set vent rate Abd: soft, non-distended  Neuro: Mental Status: Obtunded, does not open eyes spontaneously, briefly keeps eyes opened after examiner passive eye opening, does not follow commands.  Cranial Nerves: PERRL, does not fixate or track, has a right gaze preference with oculocephalic reflex intact, blink to threat is absent throughout, unable to assess hearing due to patient's condition, head appears grossly midline, cough, gag, and corneal reflexes are intact.  Motor: Triple flexion to pain noted to bilateral lower extremities Bilateral upper extremities withdraw to noxious stimuli Patient has myoclonus to the left upper extremity intermittently at rest but that increases with stimulation.  Initially, tone is flaccid but with stimulation and increased myoclonic movement of the left upper extremity, his LUE tone increases.  Bulk is normal throughout.  Sensory: Triple flexion to noxious stimuli in BLE, minimal withdraw  to noxious stimuli in RUE, withdrawal with significant tremor of LUE. Does not grimace.  DTR: 2+ and symmetric patellae, 3+ biceps and brachioradialis bilaterally, left more brisk than right  Gait: Deferred for patient safety  Pertinent Labs: CBC    Component Value Date/Time   WBC 10.1 06/26/2021 0904   RBC 2.89 (L) 06/26/2021 0904   HGB 9.2 (L) 06/26/2021 0904   HGB 13.8 08/09/2014 1559   HCT 27.9 (L) 06/26/2021 0904   HCT 41.7 08/09/2014 1559   PLT 218 06/26/2021 0904   PLT 222 08/09/2014 1559   MCV 96.5 06/26/2021 0904   MCV 89 08/09/2014 1559   MCH 31.8 06/26/2021 0904   MCHC 33.0 06/26/2021 0904   RDW 13.6 06/26/2021 0904   RDW 13.5 08/09/2014 1559   LYMPHSABS 0.8 06/23/2021 0123   LYMPHSABS 2.0 08/09/2014 1559   MONOABS 0.5 06/23/2021 0123   MONOABS 0.6 08/09/2014 1559   EOSABS 0.0 06/23/2021 0123   EOSABS 0.2 08/09/2014 1559   BASOSABS 0.0 06/23/2021 0123   BASOSABS 0.1 08/09/2014 1559   CMP     Component Value Date/Time   NA 145 06/26/2021 0904   K 4.9 06/26/2021 0904   CL 115 (H) 06/26/2021 0904   CO2 20 (L) 06/26/2021 0904   GLUCOSE 203 (H) 06/26/2021 0904   BUN 63 (H) 06/26/2021 0904   BUN 24 11/03/2016 1037   CREATININE 5.58 (H) 06/26/2021 0904   CREATININE 1.48 (H) 10/02/2020 0954   CALCIUM 8.8 (L) 06/26/2021 0904   PROT 5.4 (L) 06/22/2021 0731   ALBUMIN 2.4 (L) 06/26/2021 0038   AST 64 (H) 06/22/2021 7616  ALT 49 (H) 06/22/2021 0731   ALKPHOS 61 06/22/2021 0731   BILITOT 0.7 06/22/2021 0731   GFRNONAA 10 (L) 06/26/2021 0904   GFRNONAA 48 (L) 10/02/2020 0954   GFRAA 56 (L) 10/02/2020 0954   Imaging Reviewed: MRI brain 06/25/2021: Personally reviewed by attending MD No acute intracranial abnormality. Neurology MD reviewed there is a slight hyperintensity in the pons however I favor this to be an artifact  CT Head wo contrast 06/22/2021: No acute intracranial abnormalities.  EEG overnight 06/23/2021 - 06/24/2021: "This study showed showed  generalized peirodic discharges with triphasic morphology at 1.5-2 Hz. This EEG pattern is on the ictal-interictal continuum.  However, the morphology of discharges, frequency as well as reactivity to stimulation can also be seen due to toxic-metabolic causes. Additionally there is evidence of epileptogenicity in left posterior quadrant as well as severe diffuse encephalopathy. No definite seizures were seen during the study.  With h/o cardiac arrest, this eeg can be suggestive of anoxic-hypoxic brain injury."  Assessment: 70 year old male s/p in-hospital cardiac arrest on 06/22/2021 at 3:35 AM, 20 minutes to ROSC with upper extremity myoclonus.  - Examination 10/8 reveals patient who is intubated and not sedated in the ICU with a right gaze preference and a left upper extremity myoclonus that worsens with stimulation and improves with rest. (Previously documented right > left upper extremity myoclonus, and withdrawal in all 4 extremities) - Previous 24-hour EEG imaging showed generalized periodic discharges with triphasic morphology at 1.5 - 2 Hz without evolution into seizure activity - MRI brain imaging is without acute intracranial abnormalities, despite worsening exam (triple flexion in lower extremities, minimal movement of RUE now) -- note anoxic brain injury is still a possibility as MRI findings do not always correlate with examination in these cases - Presentation of myoclonus is felt to be more likely a result of renal failure as post-anoxic myoclonus typically does not worsen with stimulation and improve with rest.  - The etiology of patients persistent encephalopathy is unclear at this time with EEG evidence without definite seizures and MRI brain without acute abnormality, though his BUN continues to elevate while inpatient.    Recommendations:  - Discussed with CCM team at bedside, agree with LP for further evaluation of AMS of unclear etiology, with basic studies, to evaluate for  inflammatory etiologies of his decline though overall low index of suspicion for this  - MRI cervical spine and thoracic spine when able to obtain, to rule out a structural etiology of worsening - Minimize sedation, continue seizure precautions - As needed IV Ativan 2 mg for clinical seizure-like activity - Appreciate nephrology consideration of dialysis for worsening BUN which may be contributing - Management of rest of comorbidities per primary team  Anibal Henderson, AGACNP-BC [facility] [phone]  Attending Neurologist's note:  I personally saw this patient, gathering history, performing a full neurologic examination, reviewing relevant labs, personally reviewing relevant imaging including MRI brain, and formulated the assessment and plan, adding the note above for completeness and clarity to accurately reflect my thoughts  CRITICAL CARE Performed by: Lorenza Chick   Total critical care time: 40 minutes  Critical care time was exclusive of separately billable procedures and treating other patients.  Critical care was necessary to treat or prevent imminent or life-threatening deterioration.  Critical care was time spent personally by me on the following activities: development of treatment plan with patient and/or surrogate as well as nursing, discussions with consultants, evaluation of patient's response to treatment, examination of patient,  obtaining history from patient or surrogate, ordering and performing treatments and interventions, ordering and review of laboratory studies, ordering and review of radiographic studies, pulse oximetry and re-evaluation of patient's condition.

## 2021-06-26 NOTE — Progress Notes (Signed)
eLink Physician-Brief Progress Note Patient Name: GAUGE WINSKI DOB: 1951-03-01 MRN: [id number]   Date of Service  06/26/2021  HPI/Events of Note  Review of CXR reveals left basilar chest tube in place.  No pneumothorax. Improving left basilar consolidation. Stable cardiomegaly.  eICU Interventions  Continue present management.     Intervention Category Major Interventions: Respiratory failure - evaluation and management  Atzin Buchta Eugene 06/26/2021, 3:32 AM

## 2021-06-26 NOTE — Progress Notes (Signed)
Patient back from MRI. No complications.

## 2021-06-26 NOTE — Progress Notes (Signed)
Patient transported to MRI from [facility]

## 2021-06-26 NOTE — Progress Notes (Signed)
eLink Physician-Brief Progress Note Patient Name: Scott Woods DOB: 1951-07-20 MRN: [id number]   Date of Service  06/26/2021  HPI/Events of Note  Tremors - seizures vs myoclonus?  eICU Interventions  Plan: Versed 2 mg IV Q 2 hours PRN seizure or myoclonus.      Intervention Category Major Interventions: Other:  Lysle Dingwall 06/26/2021, 5:48 AM

## 2021-06-26 NOTE — Progress Notes (Signed)
eLink Physician-Brief Progress Note Patient Name: Scott Woods DOB: December 28, 1950 MRN: [id number]   Date of Service  06/26/2021  HPI/Events of Note  ABG on 30%/SIMV 6/TV 500/PS 10/P 5 = 7.326/39.4/148/20.6.  eICU Interventions  Continue present ventilator management.     Intervention Category Major Interventions: Respiratory failure - evaluation and management  [name] Eugene 06/26/2021, 4:21 AM

## 2021-06-26 NOTE — Procedures (Signed)
Central Venous Catheter Insertion Procedure Note  YANNIS BROCE  [id number]  1950-10-11  Date:06/26/21  Time:3:04 PM   Provider Performing:Lataja Newland   Procedure: Insertion of Non-tunneled Central Venous Catheter(36556)with US guidance (82993)    Indication(s) Hemodialysis  Consent Risks of the procedure as well as the alternatives and risks of each were explained to the patient and/or caregiver.  Consent for the procedure was obtained and is signed in the bedside chart  Anesthesia Topical only with 1% lidocaine   Timeout Verified patient identification, verified procedure, site/side was marked, verified correct patient position, special equipment/implants available, medications/allergies/relevant history reviewed, required imaging and test results available.  Sterile Technique Maximal sterile technique including full sterile barrier drape, hand hygiene, sterile gown, sterile gloves, mask, hair covering, sterile ultrasound probe cover (if used).  Procedure Description Area of catheter insertion was cleaned with chlorhexidine and draped in sterile fashion.   With real-time ultrasound guidance a HD catheter was placed into the right internal jugular vein.  Nonpulsatile blood flow and easy flushing noted in all ports.  The catheter was sutured in place and sterile dressing applied.  Complications/Tolerance None; patient tolerated the procedure well. Chest X-ray is ordered to verify placement for internal jugular or subclavian cannulation.  Chest x-ray is not ordered for femoral cannulation.  EBL Minimal  Specimen(s) None

## 2021-06-27 DIAGNOSIS — I469 Cardiac arrest, cause unspecified: Secondary | ICD-10-CM | POA: Diagnosis not present

## 2021-06-27 DIAGNOSIS — N184 Chronic kidney disease, stage 4 (severe): Secondary | ICD-10-CM | POA: Diagnosis not present

## 2021-06-27 DIAGNOSIS — N179 Acute kidney failure, unspecified: Secondary | ICD-10-CM | POA: Diagnosis not present

## 2021-06-27 DIAGNOSIS — G9340 Encephalopathy, unspecified: Secondary | ICD-10-CM

## 2021-06-27 DIAGNOSIS — I10 Essential (primary) hypertension: Secondary | ICD-10-CM | POA: Diagnosis not present

## 2021-06-27 DIAGNOSIS — E785 Hyperlipidemia, unspecified: Secondary | ICD-10-CM | POA: Diagnosis not present

## 2021-06-27 LAB — RENAL FUNCTION PANEL
Albumin: 2.3 g/dL — ABNORMAL LOW (ref 3.5–5.0)
Albumin: 2.2 g/dL — ABNORMAL LOW (ref 3.5–5.0)
Anion gap: 8 (ref 5–15)
Anion gap: 10 (ref 5–15)
BUN: 42 mg/dL — ABNORMAL HIGH (ref 8–23)
BUN: 46 mg/dL — ABNORMAL HIGH (ref 8–23)
CO2: 26 mmol/L (ref 22–32)
CO2: 25 mmol/L (ref 22–32)
Calcium: 8.7 mg/dL — ABNORMAL LOW (ref 8.9–10.3)
Calcium: 8.3 mg/dL — ABNORMAL LOW (ref 8.9–10.3)
Chloride: 107 mmol/L (ref 98–111)
Chloride: 104 mmol/L (ref 98–111)
Creatinine, Ser: 3.11 mg/dL — ABNORMAL HIGH (ref 0.61–1.24)
Creatinine, Ser: 2.69 mg/dL — ABNORMAL HIGH (ref 0.61–1.24)
GFR, Estimated: 21 mL/min — ABNORMAL LOW (ref >60)
GFR, Estimated: 25 mL/min — ABNORMAL LOW (ref >60)
Glucose, Bld: 177 mg/dL — ABNORMAL HIGH (ref 70–99)
Glucose, Bld: 151 mg/dL — ABNORMAL HIGH (ref 70–99)
Phosphorus: 3 mg/dL (ref 2.5–4.6)
Phosphorus: 3.1 mg/dL (ref 2.5–4.6)
Potassium: 4.8 mmol/L (ref 3.5–5.1)
Potassium: 5.2 mmol/L — ABNORMAL HIGH (ref 3.5–5.1)
Sodium: 141 mmol/L (ref 135–145)
Sodium: 139 mmol/L (ref 135–145)

## 2021-06-27 LAB — GLUCOSE, CAPILLARY
Glucose-Capillary: 156 mg/dL — ABNORMAL HIGH (ref 70–99)
Glucose-Capillary: 156 mg/dL — ABNORMAL HIGH (ref 70–99)
Glucose-Capillary: 160 mg/dL — ABNORMAL HIGH (ref 70–99)
Glucose-Capillary: 163 mg/dL — ABNORMAL HIGH (ref 70–99)
Glucose-Capillary: 173 mg/dL — ABNORMAL HIGH (ref 70–99)
Glucose-Capillary: 181 mg/dL — ABNORMAL HIGH (ref 70–99)
Glucose-Capillary: 93 mg/dL (ref 70–99)

## 2021-06-27 LAB — CSF CELL COUNT WITH DIFFERENTIAL
Eosinophils, CSF: 0 % (ref 0–1)
Eosinophils, CSF: 1 % (ref 0–1)
Lymphs, CSF: 83 % — ABNORMAL HIGH (ref 40–80)
Lymphs, CSF: 93 % — ABNORMAL HIGH (ref 40–80)
Monocyte-Macrophage-Spinal Fluid: 14 % — ABNORMAL LOW (ref 15–45)
Monocyte-Macrophage-Spinal Fluid: 5 % — ABNORMAL LOW (ref 15–45)
RBC Count, CSF: 13 /mm3 — ABNORMAL HIGH
RBC Count, CSF: 820 /mm3 — ABNORMAL HIGH
Segmented Neutrophils-CSF: 2 % (ref 0–6)
Segmented Neutrophils-CSF: 2 % (ref 0–6)
Tube #: 1
Tube #: 4
WBC, CSF: 8 /mm3 — ABNORMAL HIGH (ref 0–5)
WBC, CSF: 9 /mm3 — ABNORMAL HIGH (ref 0–5)

## 2021-06-27 LAB — POCT ACTIVATED CLOTTING TIME
Activated Clotting Time: 144 seconds
Activated Clotting Time: 155 seconds
Activated Clotting Time: 179 seconds
Activated Clotting Time: 173 seconds
Activated Clotting Time: 173 seconds
Activated Clotting Time: 196 seconds

## 2021-06-27 LAB — CBC
HCT: 29.7 % — ABNORMAL LOW (ref 39.0–52.0)
Hemoglobin: 9.5 g/dL — ABNORMAL LOW (ref 13.0–17.0)
MCH: 31.1 pg (ref 26.0–34.0)
MCHC: 32 g/dL (ref 30.0–36.0)
MCV: 97.4 fL (ref 80.0–100.0)
Platelets: 225 10*3/uL (ref 150–400)
RBC: 3.05 MIL/uL — ABNORMAL LOW (ref 4.22–5.81)
RDW: 13.8 % (ref 11.5–15.5)
WBC: 13.2 10*3/uL — ABNORMAL HIGH (ref 4.0–10.5)
nRBC: 0 % (ref 0.0–0.2)

## 2021-06-27 LAB — MAGNESIUM: Magnesium: 2.5 mg/dL — ABNORMAL HIGH (ref 1.7–2.4)

## 2021-06-27 MED ORDER — LABETALOL HCL 5 MG/ML IV SOLN: 10.0000 mg | INTRAVENOUS | Status: DC | PRN

## 2021-06-27 MED ORDER — SODIUM CHLORIDE 0.9 % IV SOLN
350.0000 [IU]/h | INTRAVENOUS | Status: DC
  Administered 2021-06-27: 750 [IU]/h via INTRAVENOUS_CENTRAL
  Administered 2021-06-27: 525 [IU]/h via INTRAVENOUS_CENTRAL
  Administered 2021-06-27: 350 [IU]/h via INTRAVENOUS_CENTRAL
  Administered 2021-06-28 – 2021-06-30 (×5): 750 [IU]/h via INTRAVENOUS_CENTRAL
  Filled 2021-06-27: qty 2
  Filled 2021-06-27: qty 10000
  Filled 2021-06-27: qty 2
  Filled 2021-06-27: qty 10000
  Filled 2021-06-27: qty 2
  Filled 2021-06-27: qty 10000

## 2021-06-27 MED ORDER — ALTEPLASE 2 MG IJ SOLR
2.0000 mg | Freq: Once | INTRAMUSCULAR | Status: AC
  Administered 2021-06-27: 2 mg

## 2021-06-27 MED ORDER — ALTEPLASE 2 MG IJ SOLR
2.0000 mg | Freq: Once | INTRAMUSCULAR | Status: DC
  Filled 2021-06-27: qty 2

## 2021-06-27 MED ORDER — DEXMEDETOMIDINE HCL IN NACL 400 MCG/100ML IV SOLN
0.4000 ug/kg/h | INTRAVENOUS | Status: DC
  Administered 2021-06-27: 0.6 ug/kg/h via INTRAVENOUS
  Administered 2021-06-27: 0.4 ug/kg/h via INTRAVENOUS
  Administered 2021-06-28: 0.3 ug/kg/h via INTRAVENOUS
  Administered 2021-06-28: 0.7 ug/kg/h via INTRAVENOUS
  Filled 2021-06-27 (×3): qty 100

## 2021-06-27 MED ORDER — HEPARIN (PORCINE) 2000 UNITS/L FOR CRRT: INTRAVENOUS_CENTRAL | Status: DC | PRN

## 2021-06-27 MED ORDER — ALTEPLASE 2 MG IJ SOLR
2.0000 mg | Freq: Once | INTRAMUSCULAR | Status: AC
  Administered 2021-06-27: 2 mg
  Filled 2021-06-27: qty 2

## 2021-06-27 MED ORDER — HYDRALAZINE HCL 20 MG/ML IJ SOLN
10.0000 mg | INTRAMUSCULAR | Status: DC | PRN
  Administered 2021-06-27: 10 mg via INTRAVENOUS
  Filled 2021-06-27: qty 1

## 2021-06-27 NOTE — Progress Notes (Signed)
eLink Physician-Brief Progress Note Patient Name: Scott Woods DOB: 1951-03-29 MRN: [id number]   Date of Service  06/27/2021  HPI/Events of Note  Hypertension - BP = 203/96. Patient has a history of hypertension.   eICU Interventions  Plan: Hydralazine 10 mg IV Q 4 hours PRN SBP > 170 or DBP > 100.     Intervention Category Major Interventions: Hypertension - evaluation and management  Edvin Albus Eugene 06/27/2021, 5:03 AM

## 2021-06-27 NOTE — Plan of Care (Signed)

## 2021-06-27 NOTE — Progress Notes (Signed)
Neurology Progress Note  Brief HPI: 70 y.o. male with PMHx of stage IV CKD, HtN, HLD, chronic anemia who presented to the ED on 9/29 following a syncopal episode at a volleyball game with urinary incontinence but without seizure-like activity.  CT head was negative but patient was found to have an AKI.  On 10/4 patient was unresponsive without a pulse and CPR was initiated with ROSC obtained after 20 minutes of CPR.  Post intubation chest x-ray showed left pneumothorax for which chest tube was placed.  Patient was noted to have intermittent right > left upper extremity twitching and neurology was consulted for further evaluation and LTM EEG was initiated.  Subjective: Patient underwent LP yesterday, CVC placed and CRRT initiated with improvement in labs today.   Exam: Vitals:   06/27/21 0800 06/27/21 0820  BP:  (!) 184/91  Pulse:    Resp:    Temp:    SpO2: 100%    Gen: Intubated, not sedated in the ICU Eyes: Left > right scleral edema Resp:respirations assisted via mechanical ventilator but with spontaneous respirations over set vent rate Abd: soft, non-distended  Neuro: Mental Status: Patient is restless today with spontaneous eye opening but does not fixate or track.  He remains intubated and not on sedation in the ICU.  He does not follow commands.  Cranial Nerves: PERRL, does not fixate or track, does not appear to have ongoing right gaze preference with gaze midline and oculocephalic reflex intact, blink to threat is absent throughout, unable to assess hearing due to patient's condition, head appears grossly midline, cough, gag, and corneal reflexes are intact.  Motor: Triple flexion to pain noted to bilateral lower extremities Left upper extremity withdraws to noxious stimuli and appears to posture with noxious stimuli of RUE.  RUE has minimal withdraw with nailbed pressure but does not move to more proximal stimuli.  Intermittently, with increased stimulation, patient's BUE have  antigravity movement at the elbow.  Patient has myoclonus to RUE > LUE at rest but that increases with stimulation.  RUE tone is normal, LUE paratonia present. BLE tone is normal. Bulk is normal throughout.  Sensory: Triple flexion to distal noxious stimuli in BLE, minimal withdraw to noxious stimuli in LUE > RUE. Does not grimace.  DTR: 2+ and symmetric patellae, 3+ biceps and brachioradialis bilaterally. Gait: Deferred for patient safety  Pertinent Labs: CBC    Component Value Date/Time   WBC 13.2 (H) 06/27/2021 0524   RBC 3.05 (L) 06/27/2021 0524   HGB 9.5 (L) 06/27/2021 0524   HGB 13.8 08/09/2014 1559   HCT 29.7 (L) 06/27/2021 0524   HCT 41.7 08/09/2014 1559   PLT 225 06/27/2021 0524   PLT 222 08/09/2014 1559   MCV 97.4 06/27/2021 0524   MCV 89 08/09/2014 1559   MCH 31.1 06/27/2021 0524   MCHC 32.0 06/27/2021 0524   RDW 13.8 06/27/2021 0524   RDW 13.5 08/09/2014 1559   LYMPHSABS 0.8 06/23/2021 0123   LYMPHSABS 2.0 08/09/2014 1559   MONOABS 0.5 06/23/2021 0123   MONOABS 0.6 08/09/2014 1559   EOSABS 0.0 06/23/2021 0123   EOSABS 0.2 08/09/2014 1559   BASOSABS 0.0 06/23/2021 0123   BASOSABS 0.1 08/09/2014 1559   CMP     Component Value Date/Time   NA 141 06/27/2021 0502   K 4.8 06/27/2021 0502   CL 107 06/27/2021 0502   CO2 26 06/27/2021 0502   GLUCOSE 177 (H) 06/27/2021 0502   BUN 42 (H) 06/27/2021 0502   BUN  24 11/03/2016 1037   CREATININE 3.11 (H) 06/27/2021 0502   CREATININE 1.48 (H) 10/02/2020 0954   CALCIUM 8.7 (L) 06/27/2021 0502   PROT 5.4 (L) 06/22/2021 0731   ALBUMIN 2.3 (L) 06/27/2021 0502   AST 64 (H) 06/22/2021 0731   ALT 49 (H) 06/22/2021 0731   ALKPHOS 61 06/22/2021 0731   BILITOT 0.7 06/22/2021 0731   GFRNONAA 21 (L) 06/27/2021 0502   GFRNONAA 48 (L) 10/02/2020 0954   GFRAA 56 (L) 10/02/2020 0954    Ref. Range 06/26/2021 14:31 06/26/2021 14:44  Appearance, CSF Latest Ref Range: CLEAR  CLEAR CLEAR  Glucose, CSF Latest Ref Range: 40 - 70 mg/dL  100 (H)   RBC Count, CSF Latest Ref Range: 0 /cu mm 820 (H) 13 (H)  WBC, CSF Latest Ref Range: 0 - 5 /cu mm 9 (H) 8 (H)  Segmented Neutrophils-CSF Latest Ref Range: 0 - 6 % 2 2  Lymphs, CSF Latest Ref Range: 40 - 80 % 93 (H) 83 (H)  Monocyte-Macrophage-Spinal Fluid Latest Ref Range: 15 - 45 % 5 (L) 14 (L)  Eosinophils, CSF Latest Ref Range: 0 - 1 % 0 1  Color, CSF Latest Ref Range: COLORLESS  COLORLESS COLORLESS  Supernatant Unknown COLORLESS COLORLESS  Total  Protein, CSF Latest Ref Range: 15 - 45 mg/dL 45   Tube # Unknown 4 1   Gram Stain WBC PRESENT, PREDOMINANTLY MONONUCLEAR  NO ORGANISMS SEEN  CYTOSPIN SMEAR   Culture NO GROWTH < 24 HOURS    Imaging Reviewed: MRI cervical spine 06/26/2021: - Mildly motion degraded examination. - No appreciable signal abnormality within the cervical spinal cord. - Cervical spondylosis, as outlined. No more than mild spinal canal narrowing (without spinal cord mass effect). Multilevel foraminal stenosis, greatest bilaterally at C4-C5 (moderate) and on the left at C5-C6 (moderate). - Disc degeneration is greatest at C4-C5 (moderate at this level). - Mild cervical dextrocurvature, possibly positional. Straightening of the expected cervical lordosis. Trace C4-C5 grade 1 retrolisthesis. - Incompletely assessed T2 hyperintense focus within the posterior aspect of the sella turcica. This may reflect a developmental cyst. However, a pituitary adenoma cannot be excluded. Correlate with relevant endocrinologic laboratory values. Additionally, a contrast-enhanced brain MRI with pituitary protocol may be helpful for further evaluation.   MRI thoracic spine 06/26/2021: - Motion degraded examination, limiting evaluation. - Within the limitations of motion degradation, no signal abnormality is identified within the spinal cord at the thoracic levels. - Thoracic spondylosis, as outlined. Most notably, small disc protrusions at T7-T8 and T9-T10 result in no more than  mild spinal canal narrowing (without spinal cord mass effect). No significant foraminal stenosis within the thoracic spine. - Thoracic levocurvature. - Signal abnormality within the dependent aspect of the thorax bilaterally, likely reflecting small pleural effusions and/or mild dependent atelectasis.  MRI brain 06/25/2021: Personally reviewed by attending MD No acute intracranial abnormality. Neurology MD reviewed there is a slight hyperintensity in the pons however I favor this to be an artifact, attending MD re-reviewed scan with neuroradiology today and confirmed absence of acute process  EEG overnight 06/23/2021 - 06/24/2021: "This study showed showed generalized peirodic discharges with triphasic morphology at 1.5-2 Hz. This EEG pattern is on the ictal-interictal continuum.  However, the morphology of discharges, frequency as well as reactivity to stimulation can also be seen due to toxic-metabolic causes. Additionally there is evidence of epileptogenicity in left posterior quadrant as well as severe diffuse encephalopathy. No definite seizures were seen during the study.  With h/o  cardiac arrest, this eeg can be suggestive of anoxic-hypoxic brain injury."  Assessment: 70 year old male s/p in-hospital cardiac arrest on 06/22/2021 at 3:35 AM, 20 minutes to ROSC with upper extremity myoclonus, ongoing encephalopathy of unclear etiology.  - Examination 10/9 reveals patient who is intubated and not sedated in the ICU with resolution of right gaze preference and return of previously documented right > left upper extremity myoclonus. He does have triple flexion of BLE with noxious stimuli and withdraw of BUE to noxious stimuli -- slightly improvement from yesterday - Previous 24-hour EEG imaging showed generalized periodic discharges with triphasic morphology at 1.5 - 2 Hz without evolution into seizure activity - MRI brain imaging is without acute intracranial abnormalities-- note anoxic brain injury is  still a possibility as MRI findings do not always correlate with examination in these cases - Presentation of myoclonus is felt to be more likely a result of renal failure as post-anoxic myoclonus typically does not worsen with stimulation and improve with rest. Left upper extremity paratonia is present also suggestive of an encephalopathic tone etiology. CRRT initiated yesterday by nephrology with improvement in labs this morning. Creatinine 5.63 --> 3.11, BUN 68 --> 42, eGFR 10 --> 21 - CSF protein 45, glucose 100, WBC 8. Patient with minimal pleocytosis with lymphocyte predominance, however, while this could be consistent with a low level CNS inflammation as contributor to altered mental status, may be simply secondary to anoxic brain injury. Will try and add on IgG index and OCB if possible, given borderline elevated WBC count. Per lab, no CSF in core lab but they will check with cytology when they open on Monday if there is residual CSF for additional studies.   Recommendations:  - Will call lab 10/10 to see if cytology has CSF sample to run add on labs- IgG index, OCB (ordered) - Minimize sedation, continue seizure precautions - As needed IV Ativan 2 mg for clinical seizure-like activity - Appreciate nephrology management of CRRT  - Workup of possible pituitary adenoma consider serum prolactin, IGF-1, LH, FSH, ACTH and 24 hour urine cortisol though these may be affected in the setting and better assessed on an outpatient basis - Defer pituitary adenoma protocol MRI at this time to the outpatient basis, cannot obtain contrast secondary to renal function regardless at this time - Management of rest of comorbidities per primary team - Neurology will continue to follow  Anibal Henderson, AGACNP-BC [facility] [phone]  Attending Neurologist's note:   I personally saw this patient, gathering history, performing a full neurologic examination, reviewing relevant labs, personally  reviewing relevant imaging including MRI brain, and formulated the assessment and plan, adding the note above for completeness and clarity to accurately reflect my thoughts  CRITICAL CARE Performed by: Lorenza Chick  Total critical care time: 50 minutes  Critical care time was exclusive of separately billable procedures and treating other patients.  Critical care was necessary to treat or prevent imminent or life-threatening deterioration.  Critical care was time spent personally by me on the following activities: development of treatment plan with patient and/or surrogate as well as nursing, discussions with consultants, evaluation of patient's response to treatment, examination of patient, obtaining history from patient or surrogate, ordering and performing treatments and interventions, ordering and review of laboratory studies, ordering and review of radiographic studies, pulse oximetry and re-evaluation of patient's condition.

## 2021-06-27 NOTE — Progress Notes (Addendum)
Nephrology Follow-Up Consult note   Assessment/Recommendations: Scott Woods is a/an 70 y.o. male with a past medical history significant for HTN, CAD, DM2, PVD, BPH, CKDa who present w/ fall and AKI on CKD 3a    Non-Oliguric AKI on CKD 3a: Baseline Cr 1.5 as of early 2022 then with severe AKI in May thought to be dehydration and/or obstruction now again with severe AKI (though unknown if had returned to baseline renal function after AKI).  Serologies have been unrevealing (c/f myeloma/amyloid mainly) and it looks like this is likely hemodynamic insult leading to AKI initially then potentiated by his PEA arrest with another hemodynamic insult.   -No urgent need for RRT but in light of ongoing AMS of no clear etiology proceeded to RRT in case this represents uremic encephalopathy though that would be unusual with BUN << 100.  Started PM 10/8 and labs improving.  Continue for now but if no improvement in next 24h would hold CRRT.   -Qb 100 with temp HD catheter but as labs are improving and it's running at this would just keep going with this low BFR - looks to be a catheter issue so heparin not likely to help --> ADDENDUM: later in morning filter clotted; tPA catheter and add low dose fixed heparin through circuit; aPTT added for AM given new heparin therapy -Long term need for dialysis or plans are unclear at this time --> pending course; still hopeful he could have renal recovery -No UF with HD - making good UOP and volume status seems ok right now. -Continue to monitor daily Cr, Dose meds for GFR -Monitor Daily I/Os, Daily weight  -Maintain MAP>65 for optimal renal perfusion.  -Avoid nephrotoxic medications including NSAIDs and Vanc/Zosyn combo -Hold lisinopril  PEA arrest: in hospital. TEE no dissection, EEG no seizure.   Encephalopathy: following PEA arrest but was having delerium pror- neuro following; no seizure on EEG. 10/7 MRI brain unrevealing, MRI C and T spines appear unrevealing as  does LP.  ?uremia - per above RRT 10/8 - labs improving but mental status not.   Anemia: Worsening normocytic anemia with Hb now 9s, was 10.9 in 01/2021.  Rec'd IV iron.  Myeloma w/u per above unrevealing to date.   Metabolic acidosis: Bicarb 26 d/c supplemental bicarb with pt on RRT fo rnow.  Hypertension: Hold lisinopril. Has hydral PRN but BPs have been low intermittently   HLD: On statin   Will follow - call with concerns.    [facility] 06/27/2021 6:32 AM  ___________________________________________________________  CC: AKI  Interval History/Subjective:  Yesterday had MRI C and T spine, LP and initiated CRRT for ongoing AMS.  Remains intubated, encephalopathic and not requiring sedation on vent. Opening eyes spontaneously now but otherwise no improvement in neuro status.  Son has said BL UE tremors are baseline.  UOP 1165mL yesterday, neg 7106mL yest, +1.5 for admit.  Qb 100 on temp HD catheter o/w low arterial pressures  Medications:  Current Facility-Administered Medications  Medication Dose Route Frequency Provider Last Rate Last Admin    prismasol BGK 4/2.5 infusion   CRRT Continuous Justin Mend, MD 300 mL/hr at 06/26/21 2000 New Bag at 06/26/21 2000    prismasol BGK 4/2.5 infusion   CRRT Continuous Justin Mend, MD 300 mL/hr at 06/26/21 2000 New Bag at 06/26/21 2000   0.9 %  sodium chloride infusion  250 mL Intravenous Continuous Ogan, Kerry Kass, MD       acetaminophen (  TYLENOL) tablet 650 mg  650 mg Oral Q4H Gleason, Otilio Carpen, PA-C       Or   acetaminophen (TYLENOL) 160 MG/5ML solution 650 mg  650 mg Per Tube Q4H Gleason, Otilio Carpen, PA-C   650 mg at 06/27/21 0602   Or   acetaminophen (TYLENOL) suppository 650 mg  650 mg Rectal Q4H Gleason, Otilio Carpen, PA-C       acetaminophen (TYLENOL) tablet 650 mg  650 mg Oral Q6H PRN Howerter, Justin B, DO       Or   acetaminophen (TYLENOL) suppository 650 mg  650 mg Rectal Q6H PRN Howerter, Justin  B, DO       albuterol (PROVENTIL) (2.5 MG/3ML) 0.083% nebulizer solution 2.5 mg  2.5 mg Nebulization Q4H PRN Icard, Bradley L, DO       amLODipine (NORVASC) tablet 10 mg  10 mg Per Tube Daily Jennelle Human B, NP   10 mg at 06/26/21 0943   atorvastatin (LIPITOR) tablet 40 mg  40 mg Per Tube QHS Candee Furbish, MD   40 mg at 06/26/21 2136   bethanechol (URECHOLINE) tablet 10 mg  10 mg Per Tube TID Candee Furbish, MD   10 mg at 06/26/21 2137   chlorhexidine gluconate (MEDLINE KIT) (PERIDEX) 0.12 % solution 15 mL  15 mL Mouth Rinse BID Gleason, Otilio Carpen, PA-C   15 mL at 06/26/21 0855   Chlorhexidine Gluconate Cloth 2 % PADS 6 each  6 each Topical Daily Mannam, Praveen, MD   6 each at 06/26/21 0944   docusate (COLACE) 50 MG/5ML liquid 100 mg  100 mg Per Tube BID Gleason, Otilio Carpen, PA-C   100 mg at 06/26/21 0943   feeding supplement (PROSource TF) liquid 45 mL  45 mL Per Tube Daily Candee Furbish, MD   45 mL at 06/26/21 0943   feeding supplement (VITAL 1.5 CAL) liquid 1,000 mL  1,000 mL Per Tube Continuous Candee Furbish, MD 50 mL/hr at 06/27/21 0610 Infusion Verify at 06/27/21 0610   heparin injection 1,000-6,000 Units  1,000-6,000 Units CRRT PRN Justin Mend, MD       heparin injection 5,000 Units  5,000 Units Subcutaneous Q8H Candee Furbish, MD   5,000 Units at 06/27/21 2025   hydrALAZINE (APRESOLINE) injection 10 mg  10 mg Intravenous Q4H PRN Anders Simmonds, MD   10 mg at 06/27/21 4270   insulin aspart (novoLOG) injection 0-9 Units  0-9 Units Subcutaneous Q4H Candee Furbish, MD   1 Units at 06/27/21 0458   lidocaine-EPINEPHrine (XYLOCAINE W/EPI) 1 %-1:100000 (with pres) injection 30 mL  30 mL Other Once Mannam, Praveen, MD       MEDLINE mouth rinse  15 mL Mouth Rinse 10 times per day Gleason, Otilio Carpen, PA-C   15 mL at 06/27/21 0225   midazolam (VERSED) injection 2 mg  2 mg Intravenous Q2H PRN Anders Simmonds, MD       norepinephrine (LEVOPHED) 4mg  in 237mL premix infusion  0-40 mcg/min  Intravenous Titrated Jacky Kindle, MD   Stopped at 06/26/21 2126   ondansetron (ZOFRAN) injection 4 mg  4 mg Intravenous Q6H PRN Gleason, Otilio Carpen, PA-C       pantoprazole (PROTONIX) injection 40 mg  40 mg Intravenous Daily Gleason, Otilio Carpen, PA-C   40 mg at 06/26/21 0943   polyethylene glycol (MIRALAX / GLYCOLAX) packet 17 g  17 g Per Tube Daily Gleason, Otilio Carpen, PA-C   17 g at  06/24/21 0931   prismasol BGK 4/2.5 infusion   CRRT Continuous Justin Mend, MD 1,500 mL/hr at 06/27/21 0545 New Bag at 06/27/21 0545   sodium bicarbonate tablet 1,300 mg  1,300 mg Per Tube BID Candee Furbish, MD   1,300 mg at 06/26/21 2137   sodium chloride flush (NS) 0.9 % injection 10 mL  10 mL Other Q8H Jennelle Human B, NP   10 mL at 06/26/21 1913      Review of Systems: 10 systems reviewed and negative except per interval history/subjective  Physical Exam: Vitals:   06/27/21 0600 06/27/21 0615  BP: (!) 199/72 (!) 169/90  Pulse:    Resp: 18 19  Temp:    SpO2: 100% 100%   Total I/O In: 650 [I.V.:40; NG/GT:610] Out: 786 [Urine:496; Other:250; Stool:20; Blood:20]  Intake/Output Summary (Last 24 hours) at 06/27/2021 4503 Last data filed at 06/27/2021 8882 Gross per 24 hour  Intake 679.98 ml  Output 1486 ml  Net -806.02 ml    Constitutional: intubated  ENMT: ETT in place CV: normal rate, no edema Respiratory: clear BL Gastrointestinal: soft, non-tender Skin: no visible lesions or rashes GU: condom catheter with clear yellow urine Neuro: on vent unsedated, eyes open but doesn't responde or follow commands HD access: RIJ temp HD catheter placed 10/8 c/d/I Qb 100   Test Results I personally reviewed new and old clinical labs and radiology tests Lab Results  Component Value Date   NA 141 06/27/2021   K 4.8 06/27/2021   CL 107 06/27/2021   CO2 26 06/27/2021   BUN 42 (H) 06/27/2021   CREATININE 3.11 (H) 06/27/2021   CALCIUM 8.7 (L) 06/27/2021   ALBUMIN 2.3 (L) 06/27/2021   PHOS 3.0  06/27/2021

## 2021-06-27 NOTE — Progress Notes (Signed)
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 PMHx significant for HTN, HLD, T2DM, CKD stage IV, history of Hepatitis B, adrenal nodule, prostate CA.  On admission, patient denied any preceding symptoms prior to syncopal episode. CT Head was negative. Labs demonstrated Cr 6.2 (baseline 2.7) and K 6.3.  Hyperkalemia improved with Lokelma and insulin.  He was admitted to the hospitalist service.  Overnight 10/4, patient became agitated and hypertensive and was given Ativan and hydralazine.  A short time later he suffered a PEA arrest. Code Blue was called and ROSC obtained after 20 minutes CPR. Patient was intubated with post-intubation CXR showing L pneumothorax. Chest tube was placed by CCM.  Transferred to CVICU for further care.  Pertinent Medical History:   Past Medical History:  Diagnosis Date   Adrenal nodule ([place])    Blood in the urine 05/08/2015   Borderline diabetes    Chronic kidney disease    CKD (chronic kidney disease), stage II    Diabetes mellitus without complication (HCC)    Elevated PSA    Gross hematuria    H/O type B viral hepatitis 05/08/2015   When he was a child    History of hepatitis B    HTN (hypertension)    Hyperlipidemia with target LDL less than 100    Obesity    Pre-ulcerative calluses 03/28/2016   great toe    Prostate cancer ([place])    Significant Hospital Events: Including procedures, antibiotic start and stop dates in addition to other pertinent events   9/29 admitted to hospitalist service 10/4 PEA arrest, intubation, transfer to [place], left chest tube placed for post-code PTX. LTM EEG, c/f myoclonus. CT Head NAICA. Bedside TEE negative for dissection. 10/5 sedation turned  off 10/6 intermittently hypertensive, remains on LTM, sCr 5.4-> 5.8 but still making urine, remains off sedation, weaning on PSV 10/5 10/7 LTM d/c'd, stable sCr, improving UOP, remains off sedation, weaning  Interim History / Subjective:   Critically ill, intubated on life support. Now on cvvhd. BP remains high   Objective:  Blood pressure (!) 184/91, pulse 84, temperature 98 F (36.7 C), temperature source Oral, resp. rate 16, height 5\' 8"  (1.727 m), weight 68.3 kg, SpO2 100 %.    Vent Mode: SIMV;PRVC;PSV FiO2 (%):  [30 %-50 %] (P) 50 % Set Rate:  [6 bmp-12 bmp] 6 bmp Vt Set:  [500 mL] 500 mL PEEP:  [5 cmH20] 5 cmH20 Pressure Support:  [10 cmH20] 10 cmH20 Plateau Pressure:  [0 cmH20-16 cmH20] 0 cmH20   Intake/Output Summary (Last 24 hours) at 06/27/2021 0845 Last data filed at 06/27/2021 0700 Gross per 24 hour  Intake 699.98 ml  Output 1498 ml  Net -798.02 ml   Filed Weights   06/25/21 0500 06/26/21 0500 06/27/21 0500  Weight: 69.3 kg 74 kg 68.3 kg   Physical Examination: General:  elderly male, intubated on life support  HEENT: pupils reactive, downward gaze  Neuro: rigid at times, some myoclonus  CV: RRR, s1 s2  PULM: BL vented breaths  GI: BS soft nt nd  Extremities: no edema  Skin: no rash   CXR reviewed  The patient's images have been independently reviewed by me.    Labs reviewed  BUN 42  Cr 3.11   Resolved Hospital Problem List:   Hyperkalemia  Assessment & Plan:   Witnessed in-hospital PEA arrest, unclear etiology 20 minutes ACLS before ROSC, currently unclear etiology. K WNL prior to code. PE considered, but unlikely as Echo 10/2 without e/o R heart strain. - S/p TEE 10/4 without evidence of dissection P: Supportive care  Precedex for sedation   Acute hypoxic respiratory failure 2/2 PEA arrest Left Pneumothorax - secondary to CPR P:  Remains on full vent support  Ct pulled  Wean from vent as tolerated SAT SBT  If mental status better could  extubate  If doesn't improve may need to consider trach   Acute encephalopathy post-PEA arrest, concern for anoxic brain injury P: LP neg  MRI neg EEG neg  Continue cvvhd to see if this helps   Normocytic Anemia, last transfused 10/4, 1u PRBC - follow closely   Acute kidney injury on chronic CKD stage IV, non-oliguric BPH NAGMA P: Appreciate nephrology work up Continue cvvhd  BMP and I&Os Follow electrolytes   Hypertension - norvasc - PRN labetalol    Best Practice: (right click and "Reselect all SmartList Selections" daily)   Diet/type: NPO; TF  DVT prophylaxis: prophylactic heparin  GI prophylaxis: PPI Lines: N/A Foley:  N/A Code Status:  full code Last date of multidisciplinary goals of care discussion: pending 10/8  This patient is critically ill with multiple organ system failure; which, requires frequent high complexity decision making, assessment, support, evaluation, and titration of therapies. This was completed through the application of advanced monitoring technologies and extensive interpretation of multiple databases. During this encounter critical care time was devoted to patient care services described in this note for 33 minutes.  Garner Nash, DO [facility] 06/27/2021 8:46 AM

## 2021-06-28 DIAGNOSIS — I469 Cardiac arrest, cause unspecified: Secondary | ICD-10-CM | POA: Diagnosis not present

## 2021-06-28 DIAGNOSIS — G9340 Encephalopathy, unspecified: Secondary | ICD-10-CM

## 2021-06-28 DIAGNOSIS — N184 Chronic kidney disease, stage 4 (severe): Secondary | ICD-10-CM | POA: Diagnosis not present

## 2021-06-28 DIAGNOSIS — N179 Acute kidney failure, unspecified: Secondary | ICD-10-CM | POA: Diagnosis not present

## 2021-06-28 DIAGNOSIS — R0603 Acute respiratory distress: Secondary | ICD-10-CM

## 2021-06-28 LAB — GLUCOSE, CAPILLARY
Glucose-Capillary: 155 mg/dL — ABNORMAL HIGH (ref 70–99)
Glucose-Capillary: 199 mg/dL — ABNORMAL HIGH (ref 70–99)
Glucose-Capillary: 189 mg/dL — ABNORMAL HIGH (ref 70–99)
Glucose-Capillary: 165 mg/dL — ABNORMAL HIGH (ref 70–99)
Glucose-Capillary: 197 mg/dL — ABNORMAL HIGH (ref 70–99)
Glucose-Capillary: 205 mg/dL — ABNORMAL HIGH (ref 70–99)

## 2021-06-28 LAB — RENAL FUNCTION PANEL
Albumin: 2 g/dL — ABNORMAL LOW (ref 3.5–5.0)
Albumin: 2.1 g/dL — ABNORMAL LOW (ref 3.5–5.0)
Anion gap: 8 (ref 5–15)
Anion gap: 6 (ref 5–15)
BUN: 39 mg/dL — ABNORMAL HIGH (ref 8–23)
BUN: 34 mg/dL — ABNORMAL HIGH (ref 8–23)
CO2: 25 mmol/L (ref 22–32)
CO2: 27 mmol/L (ref 22–32)
Calcium: 8.1 mg/dL — ABNORMAL LOW (ref 8.9–10.3)
Calcium: 8 mg/dL — ABNORMAL LOW (ref 8.9–10.3)
Chloride: 104 mmol/L (ref 98–111)
Chloride: 102 mmol/L (ref 98–111)
Creatinine, Ser: 2.08 mg/dL — ABNORMAL HIGH (ref 0.61–1.24)
Creatinine, Ser: 1.7 mg/dL — ABNORMAL HIGH (ref 0.61–1.24)
GFR, Estimated: 34 mL/min — ABNORMAL LOW (ref >60)
GFR, Estimated: 43 mL/min — ABNORMAL LOW (ref >60)
Glucose, Bld: 232 mg/dL — ABNORMAL HIGH (ref 70–99)
Glucose, Bld: 190 mg/dL — ABNORMAL HIGH (ref 70–99)
Phosphorus: 2.8 mg/dL (ref 2.5–4.6)
Phosphorus: 1.9 mg/dL — ABNORMAL LOW (ref 2.5–4.6)
Potassium: 5 mmol/L (ref 3.5–5.1)
Potassium: 4.7 mmol/L (ref 3.5–5.1)
Sodium: 137 mmol/L (ref 135–145)
Sodium: 135 mmol/L (ref 135–145)

## 2021-06-28 LAB — POCT ACTIVATED CLOTTING TIME
Activated Clotting Time: 196 seconds
Activated Clotting Time: 208 seconds
Activated Clotting Time: 208 seconds
Activated Clotting Time: 213 seconds

## 2021-06-28 LAB — CBC
HCT: 25.3 % — ABNORMAL LOW (ref 39.0–52.0)
Hemoglobin: 8.2 g/dL — ABNORMAL LOW (ref 13.0–17.0)
MCH: 32 pg (ref 26.0–34.0)
MCHC: 32.4 g/dL (ref 30.0–36.0)
MCV: 98.8 fL (ref 80.0–100.0)
Platelets: 231 10*3/uL (ref 150–400)
RBC: 2.56 MIL/uL — ABNORMAL LOW (ref 4.22–5.81)
RDW: 13.3 % (ref 11.5–15.5)
WBC: 16.7 10*3/uL — ABNORMAL HIGH (ref 4.0–10.5)
nRBC: 0 % (ref 0.0–0.2)

## 2021-06-28 LAB — MAGNESIUM: Magnesium: 2.4 mg/dL (ref 1.7–2.4)

## 2021-06-28 LAB — CYTOLOGY - NON PAP

## 2021-06-28 NOTE — Progress Notes (Signed)
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 PMHx significant for HTN, HLD, T2DM, CKD stage IV, history of Hepatitis B, adrenal nodule, prostate CA.  On admission, patient denied any preceding symptoms prior to syncopal episode. CT Head was negative. Labs demonstrated Cr 6.2 (baseline 2.7) and K 6.3.  Hyperkalemia improved with Lokelma and insulin.  He was admitted to the hospitalist service.  Overnight 10/4, patient became agitated and hypertensive and was given Ativan and hydralazine.  A short time later he suffered a PEA arrest. Code Blue was called and ROSC obtained after 20 minutes CPR. Patient was intubated with post-intubation CXR showing L pneumothorax. Chest tube was placed by CCM.  Transferred to CVICU for further care.  Pertinent Medical History:   Past Medical History:  Diagnosis Date   Adrenal nodule ([place])    Blood in the urine 05/08/2015   Borderline diabetes    Chronic kidney disease    CKD (chronic kidney disease), stage II    Diabetes mellitus without complication (HCC)    Elevated PSA    Gross hematuria    H/O type B viral hepatitis 05/08/2015   When he was a child    History of hepatitis B    HTN (hypertension)    Hyperlipidemia with target LDL less than 100    Obesity    Pre-ulcerative calluses 03/28/2016   great toe    Prostate cancer ([place])    Significant Hospital Events: Including procedures, antibiotic start and stop dates in addition to other pertinent events   9/29 admitted to hospitalist service 10/4 PEA arrest, intubation, transfer to [place], left chest tube placed for post-code PTX. LTM EEG, c/f myoclonus. CT Head NAICA. Bedside TEE negative for dissection. 10/5 sedation turned  off 10/6 intermittently hypertensive, remains on LTM, sCr 5.4-> 5.8 but still making urine, remains off sedation, weaning on PSV 10/5 10/7 LTM d/c'd, stable sCr, improving UOP, remains off sedation, weaning  Interim History / Subjective:  Remains on CRRT.  Comfortable on Precedex.  Objective:  Blood pressure (!) 74/51, pulse 63, temperature (!) 96.3 F (35.7 C), temperature source Axillary, resp. rate 20, height 5\' 8"  (1.727 m), weight 68.3 kg, SpO2 99 %.    Vent Mode: SIMV;PRVC;PSV FiO2 (%):  [40 %] 40 % Set Rate:  [6 bmp-96 bmp] 6 bmp Vt Set:  [500 mL] 500 mL PEEP:  [5 cmH20] 5 cmH20 Pressure Support:  [10 cmH20] 10 cmH20 Plateau Pressure:  [24 cmH20] 24 cmH20   Intake/Output Summary (Last 24 hours) at 06/28/2021 0802 Last data filed at 06/28/2021 0800 Gross per 24 hour  Intake 1063.83 ml  Output 1305 ml  Net -241.17 ml    Filed Weights   06/25/21 0500 06/26/21 0500 06/27/21 0500  Weight: 69.3 kg 74 kg 68.3 kg   Physical Examination: General:  elderly male, intubated, in NAD HEENT: pupils reactive, downward gaze  Neuro: rigid at times, ? intermittent myoclonus  CV: RRR PULM: BL vented breaths  GI: BS soft nt nd  Extremities: no edema  Skin: no rash   Assessment & Plan:   Witnessed in-hospital PEA arrest 10/4, unclear etiology 20 minutes ACLS before ROSC, currently unclear etiology. K WNL prior to code. PE considered, but unlikely  as Echo 10/2 without e/o R heart strain. - S/p TEE 10/4 without evidence of dissection P: Supportive care  Continue levophed as needed for goal MAP > 65 Precedex for sedation   Acute hypoxic respiratory failure 2/2 PEA arrest Left Pneumothorax - secondary to CPR, s/p chest tube placement which has since been removed P:  Remains on full vent support  Wean as mental status allows SAT SBT  If doesn't improve may need to consider trach   Acute encephalopathy post-PEA arrest, concern for anoxic brain injury.  MRI and EEG neg.  LP 10/8  neg though neuro checking with lab for some add on labs (IgG index, OCB) P: Neuro following, appreciate the assistance Neuro considering workup of pituitary adenoma as outpatient Continue cvvhd to see if this helps   Normocytic Anemia, last transfused 10/4, 1u PRBC P: follow closely   Acute kidney injury on chronic CKD stage IV, non-oliguric BPH NAGMA P: Appreciate nephrology work up Continue cvvhd  Levophed PRN to support CVVHD BMP and I&Os Follow electrolytes   Hypertension P: Norvasc,  PRN labetalol    Best Practice: (right click and "Reselect all SmartList Selections" daily)   Diet/type: NPO; TF  DVT prophylaxis: prophylactic heparin  GI prophylaxis: PPI Lines: N/A Foley:  N/A Code Status:  full code Last date of multidisciplinary goals of care discussion: pending 10/8  CC time: 35 min.   Montey Hora, [facility] For pager details, please see AMION or use Epic chat  After 1900, please call [facility] for cross coverage needs 06/28/2021, 8:15 AM

## 2021-06-28 NOTE — Progress Notes (Signed)
Per Dr. Posey Pronto, leave Heparin syringe at 750 fixed with no ACT's.

## 2021-06-28 NOTE — Progress Notes (Signed)
Patient ID: Scott Woods, male   DOB: December 06, 1950, 70 y.o.   MRN: [id number] [facility] Progress Note   Assessment/ Plan:   1. Acute kidney Injury on chronic kidney disease stage III (baseline creatinine 1.5 prior to severe AKI 5 months ago): Suspected hemodynamically mediated with ATN in the setting of PEA arrest.  Oliguric overnight while on CRRT (initiated for 2.  Status post in-hospital PEA arrest: Occurred on 10/4 with 20 minutes of ACLS before ROSC and etiology remains unclear.  He remains on pressors/intubated. 3.  Acute encephalopathy: Status post PEA arrest with concern for hypoxic injury and seen by neurology-their notes reviewed. 4.  Anemia: Without evidence of plasma cell dyscrasia at this time and without overt blood loss, continue to monitor hemoglobin hematocrit trend.  Subjective:   Without acute events overnight, tolerating CRRT without problems.   Objective:   BP (!) 74/51   Pulse 84   Temp 99.1 F (37.3 C) (Oral)   Resp 20   Ht _0  (1.727 m)   Wt 68.3 kg   SpO2 100%   BMI 22.89 kg/m   Intake/Output Summary (Last 24 hours) at 06/28/2021 9678 Last data filed at 06/28/2021 0700 Gross per 24 hour  Intake 983.83 ml  Output 1381 ml  Net -397.17 ml   Weight change:   Physical Exam: Gen: Intubated, sedated, appears to stir to calling out his name loudly CVS: Pulse regular rhythm, normal rate, S1 and S2 normal Resp: Clear to auscultation, no rales/rhonchi Abd: Soft, flat, nontender, bowel sounds normal Ext: No lower extremity edema  Imaging: MR CERVICAL SPINE WO CONTRAST  Result Date: 06/26/2021 CLINICAL DATA:  Myelopathy, acute or progressive. EXAM: MRI CERVICAL SPINE WITHOUT CONTRAST TECHNIQUE: Multiplanar, multisequence MR imaging of the cervical spine was performed. No intravenous contrast was administered. COMPARISON:  CT of the cervical spine 05/20/2021. FINDINGS: Mild intermittent motion degradation. Alignment: Mild cervical  dextrocurvature, possibly positional. Straightening of the expected cervical lordosis. No significant spondylolisthesis. Vertebrae: Body height is maintained. No significant marrow edema or focal suspicious osseous lesion. C3 vertebral body hemangioma. Cord: No spinal cord signal abnormality is identified. Posterior Fossa, vertebral arteries, paraspinal tissues: Incompletely assessed T2 hyperintense focus within the posterior aspect of the sella turcica (series 3, image 7). Secretions within the nasopharynx and oropharynx. Partially visualized support tubes. Flow voids preserved within the imaged cervical vertebral arteries. Paraspinal soft tissues unremarkable. Disc levels: Moderate disc degeneration at C4-C5. No more than mild disc degeneration at the remaining levels. C2-C3: Small central disc protrusion. The disc protrusion results in mild focal effacement of the ventral thecal sac (without spinal cord mass effect). No significant foraminal stenosis. C3-C4: Bilateral uncovertebral hypertrophy. No significant spinal canal stenosis. Mild relative bilateral neural foraminal narrowing (greater on the left). C4-C5: Trace grade 1 retrolisthesis. Disc bulge with bilateral disc osteophyte ridge/uncinate hypertrophy. Superimposed small central disc protrusion. Mild facet arthrosis. Mild relative narrowing of the spinal canal (without spinal cord mass effect). Moderate bilateral neural foraminal narrowing. C5-C6: Small central disc protrusion. Mild bilateral uncovertebral hypertrophy. Mild facet arthrosis. The disc protrusion results in mild focal effacement of the ventral thecal sac (without spinal cord mass effect). Bilateral neural foraminal narrowing (mild right, moderate left). C6-C7: Uncovertebral hypertrophy on the left. Minimal facet arthrosis. No significant disc herniation or spinal canal stenosis. Mild relative left neural foraminal narrowing. C7-T1: No significant disc herniation or stenosis. IMPRESSION:  Mildly motion degraded examination. No appreciable signal abnormality within the cervical spinal cord. Cervical spondylosis, as outlined.  No more than mild spinal canal narrowing (without spinal cord mass effect). Multilevel foraminal stenosis, greatest bilaterally at C4-C5 (moderate) and on the left at C5-C6 (moderate). Disc degeneration is greatest at C4-C5 (moderate at this level). Mild cervical dextrocurvature, possibly positional. Straightening of the expected cervical lordosis. Trace C4-C5 grade 1 retrolisthesis. Incompletely assessed T2 hyperintense focus within the posterior aspect of the sella turcica. This may reflect a developmental cyst. However, a pituitary adenoma cannot be excluded. Correlate with relevant endocrinologic laboratory values. Additionally, a contrast-enhanced brain MRI with pituitary protocol may be helpful for further evaluation. Electronically Signed   By: Kellie Simmering D.O.   On: 06/26/2021 18:04   MR THORACIC SPINE WO CONTRAST  Result Date: 06/26/2021 CLINICAL DATA:  Myelopathy, acute or progressive. EXAM: MRI THORACIC SPINE WITHOUT CONTRAST TECHNIQUE: Multiplanar, multisequence MR imaging of the thoracic spine was performed. No intravenous contrast was administered. COMPARISON:  Same-day MRI of the cervical spine. FINDINGS: Intermittently motion degraded examination, limiting evaluation. Most notably, there is moderate motion degradation of the axial T2 GRE sequence. Alignment: Thoracic levocurvature. No significant spondylolisthesis. Vertebrae: No vertebral compression fracture. Mild multilevel degenerative endplate irregularity. No significant marrow edema or focal suspicious osseous lesion. Multilevel vertebral body hemangiomas. Cord: Within the limitations of motion degradation, no signal abnormality is identified within the spinal cord at the thoracic levels Paraspinal and other soft tissues: Bilateral pleural effusions. Signal abnormality in the dependent aspect of the  thorax bilaterally, likely reflecting small effusions and mild atelectasis. Small T2 hyperintense focus within the liver, incompletely characterized but possibly reflecting a cyst. Paraspinal soft tissues unremarkable. Disc levels: No more than mild disc degeneration within the thoracic spine. There are shallow multilevel disc bulges. At T7-T8, there is a tiny central disc protrusion without significant spinal canal stenosis (series 14, image 23). At T9-T10, there is a small central disc protrusion which focally effaces the ventral thecal sac resulting in mild relative spinal canal narrowing (without spinal cord mass effect) (series 14, image 28). No significant foraminal stenosis within the thoracic spine. IMPRESSION: Motion degraded examination, limiting evaluation. Within the limitations of motion degradation, no signal abnormality is identified within the spinal cord at the thoracic levels. Thoracic spondylosis, as outlined. Most notably, small disc protrusions at T7-T8 and T9-T10 result in no more than mild spinal canal narrowing (without spinal cord mass effect). No significant foraminal stenosis within the thoracic spine. Thoracic levocurvature. Signal abnormality within the dependent aspect of the thorax bilaterally, likely reflecting small pleural effusions and/or mild dependent atelectasis. Electronically Signed   By: Kellie Simmering D.O.   On: 06/26/2021 18:21   DG CHEST PORT 1 VIEW  Result Date: 06/26/2021 CLINICAL DATA:  Central line placement, encephalopathy EXAM: PORTABLE CHEST 1 VIEW COMPARISON:  06/26/2021 FINDINGS: Single frontal view of the chest demonstrates endotracheal tube overlying tracheal air column tip just below thoracic inlet. Enteric catheter tip and side port project over the gastric body. Right internal jugular catheter tip overlies superior vena cava. Cardiac silhouette is stable. No airspace disease, effusion, or pneumothorax. No acute bony abnormalities. Prior healed left rib  fractures. IMPRESSION: 1. No complication after right internal jugular catheter placement. Support devices as above. 2. No acute airspace disease. Electronically Signed   By: Randa Ngo M.D.   On: 06/26/2021 16:28    Labs: BMET Recent Labs  Lab 06/24/21 1641 06/25/21 0449 06/26/21 0038 06/26/21 0342 06/26/21 0904 06/26/21 1800 06/27/21 0502 06/27/21 2047 06/28/21 0539  NA  --  144 146* 145 145 147*  141 139 137  K  --  4.8 5.3* 4.8 4.9 5.1 4.8 5.2* 5.0  CL  --  114* 114*  --  115* 115* 107 104 104  CO2  --  19* 21*  --  20* 21* _0 GLUCOSE  --  197* 168*  --  203* 121* 177* 151* 232*  BUN  --  56* 61*  --  63* 68* 42* 46* 39*  CREATININE  --  5.95* 5.56*  --  5.58* 5.63* 3.11* 2.69* 2.08*  CALCIUM  --  8.9 9.1  --  8.8* 8.9 8.7* 8.3* 8.1*  PHOS 5.5* 5.1*  5.3* 5.9*  --   --  5.2* 3.0 3.1 2.8   CBC Recent Labs  Lab 06/23/21 0123 06/23/21 0837 06/24/21 0431 06/26/21 0342 06/26/21 0904 06/27/21 0524  WBC 6.8  --  8.8  --  10.1 13.2*  NEUTROABS 5.4  --   --   --   --   --   HGB 8.3*   < > 7.9* 16.7 9.2* 9.5*  HCT 23.9*   < > 23.8* 49.0 27.9* 29.7*  MCV 92.3  --  94.1  --  96.5 97.4  PLT 157  --  163  --  218 225   < > = values in this interval not displayed.    Medications:     acetaminophen  650 mg Oral Q4H   Or   acetaminophen (TYLENOL) oral liquid 160 mg/5 mL  650 mg Per Tube Q4H   Or   acetaminophen  650 mg Rectal Q4H   alteplase  2 mg Intracatheter Once   amLODipine  10 mg Per Tube Daily   atorvastatin  40 mg Per Tube QHS   bethanechol  10 mg Per Tube TID   chlorhexidine gluconate (MEDLINE KIT)  15 mL Mouth Rinse BID   Chlorhexidine Gluconate Cloth  6 each Topical Daily   docusate  100 mg Per Tube BID   feeding supplement (PROSource TF)  45 mL Per Tube Daily   heparin injection (subcutaneous)  5,000 Units Subcutaneous Q8H   insulin aspart  0-9 Units Subcutaneous Q4H   lidocaine-EPINEPHrine  30 mL Other Once   mouth rinse  15 mL Mouth Rinse 10  times per day   pantoprazole (PROTONIX) IV  40 mg Intravenous Daily   polyethylene glycol  17 g Per Tube Daily   sodium chloride flush  10 mL Other Q8H   Elmarie Shiley, MD 06/28/2021, 7:22 AM

## 2021-06-28 NOTE — Progress Notes (Signed)
Subjective:  NAEO. Continues to have bilateral upper extremity myoclonus, Not following commands  ROS: Unable to obtain due to poor mental status  Examination  Vital signs in last 24 hours: Temp:  [93.9 F (34.4 C)-99.1 F (37.3 C)] 98.8 F (37.1 C) (10/10 1700) Pulse Rate:  [59-85] 77 (10/10 1700) Resp:  [13-27] 19 (10/10 1700) BP: (72-174)/(15-93) 113/42 (10/10 1700) SpO2:  [89 %-100 %] 100 % (10/10 1700) FiO2 (%):  [40 %] 40 % (10/10 1538)  General: lying in bed, NAD CVS: pulse-normal rate and rhythm RS: Intubated, CTAB Extremities: normal, warm Neuro: on precedex, grimaces to noxious stimuli, PERRLA, corneal reflex intact, gag reflex intact, withdrawal with antigravity strength in all 4 extremities, bilateral upper extremity myoclonus more prominent with stimulation  Basic Metabolic Panel: Recent Labs  Lab 06/24/21 0431 06/24/21 1641 06/25/21 0449 06/26/21 0038 06/26/21 1800 06/27/21 0502 06/27/21 0524 06/27/21 2047 06/28/21 0539 06/28/21 1513  NA 143  --  144   < > 147* 141  --  139 137 135  K 4.5  --  4.8   < > 5.1 4.8  --  5.2* 5.0 4.7  CL 112*  --  114*   < > 115* 107  --  104 104 102  CO2 19*  --  19*   < > 21* 26  --  25 25 27   GLUCOSE 191*  --  197*   < > 121* 177*  --  151* 232* 190*  BUN 49*  --  56*   < > 68* 42*  --  46* 39* 34*  CREATININE 5.83*  --  5.95*   < > 5.63* 3.11*  --  2.69* 2.08* 1.70*  CALCIUM 8.6*  --  8.9   < > 8.9 8.7*  --  8.3* 8.1* 8.0*  MG 2.4 2.4 2.4  --   --   --  2.5*  --  2.4  --   PHOS 5.1* 5.5* 5.1*  5.3*   < > 5.2* 3.0  --  3.1 2.8 1.9*   < > = values in this interval not displayed.    CBC: Recent Labs  Lab 06/23/21 0123 06/23/21 0837 06/24/21 0431 06/26/21 0342 06/26/21 0904 06/27/21 0524 06/28/21 0832  WBC 6.8  --  8.8  --  10.1 13.2* 16.7*  NEUTROABS 5.4  --   --   --   --   --   --   HGB 8.3*   < > 7.9* 16.7 9.2* 9.5* 8.2*  HCT 23.9*   < > 23.8* 49.0 27.9* 29.7* 25.3*  MCV 92.3  --  94.1  --  96.5 97.4 98.8   PLT 157  --  163  --  218 225 231   < > = values in this interval not displayed.     Coagulation Studies: Recent Labs    06/26/21 0904  LABPROT 14.3  INR 1.1    Imaging MR Brain wo contrast 06/25/2021: No acute intracranial abnormality.  ASSESSMENT AND PLAN:  70 year old male status post in-hospital cardiac arrest on 06/22/2021 at 3:35 AM, 20 minutes to ROSC.   Cardiac arrest Acute encephalopathy -Encephalopathy likely due to AKI, hypoxic brain injury -Right more than left upper extremity stimulation to his myoclonus could be secondary to renal failure.  Less likely post anoxic myoclonus because it is typically not this only after cardiac arrest   Recommendations: -Even though MRI brain didn't show significant anoxic injury, I suspected patient has had some degree of hypoxic  brain injury - Renal function improving with CRRT. Anticipate myoclonus will improve as renal function and mental status improves -Minimize sedation, continue seizure precautions -As needed IV Ativan 2 mg for clinical seizure-like activity -Management of rest of comorbidities per primary team - Discussed plan with Dr Alice Reichert Epilepsy [facility] For questions after 5pm please refer to AMION to reach the Neurologist on call

## 2021-06-28 NOTE — Plan of Care (Signed)

## 2021-06-29 ENCOUNTER — Inpatient Hospital Stay (HOSPITAL_COMMUNITY): Payer: Medicare (Managed Care)

## 2021-06-29 DIAGNOSIS — N179 Acute kidney failure, unspecified: Secondary | ICD-10-CM | POA: Diagnosis not present

## 2021-06-29 DIAGNOSIS — N183 Chronic kidney disease, stage 3 unspecified: Secondary | ICD-10-CM | POA: Diagnosis not present

## 2021-06-29 DIAGNOSIS — J939 Pneumothorax, unspecified: Secondary | ICD-10-CM

## 2021-06-29 DIAGNOSIS — T17908A Unspecified foreign body in respiratory tract, part unspecified causing other injury, initial encounter: Secondary | ICD-10-CM

## 2021-06-29 DIAGNOSIS — Z4682 Encounter for fitting and adjustment of non-vascular catheter: Secondary | ICD-10-CM

## 2021-06-29 DIAGNOSIS — W19XXXA Unspecified fall, initial encounter: Secondary | ICD-10-CM | POA: Diagnosis not present

## 2021-06-29 DIAGNOSIS — J9600 Acute respiratory failure, unspecified whether with hypoxia or hypercapnia: Secondary | ICD-10-CM

## 2021-06-29 DIAGNOSIS — Z515 Encounter for palliative care: Secondary | ICD-10-CM

## 2021-06-29 DIAGNOSIS — I469 Cardiac arrest, cause unspecified: Secondary | ICD-10-CM | POA: Diagnosis not present

## 2021-06-29 DIAGNOSIS — Z7189 Other specified counseling: Secondary | ICD-10-CM

## 2021-06-29 LAB — CBC
HCT: 22 % — ABNORMAL LOW (ref 39.0–52.0)
Hemoglobin: 7 g/dL — ABNORMAL LOW (ref 13.0–17.0)
MCH: 31.1 pg (ref 26.0–34.0)
MCHC: 31.8 g/dL (ref 30.0–36.0)
MCV: 97.8 fL (ref 80.0–100.0)
Platelets: 220 10*3/uL (ref 150–400)
RBC: 2.25 MIL/uL — ABNORMAL LOW (ref 4.22–5.81)
RDW: 13.4 % (ref 11.5–15.5)
WBC: 14.1 10*3/uL — ABNORMAL HIGH (ref 4.0–10.5)
nRBC: 0.2 % (ref 0.0–0.2)

## 2021-06-29 LAB — CSF CULTURE W GRAM STAIN: Culture: NO GROWTH

## 2021-06-29 LAB — PROLACTIN: Prolactin: 5 ng/mL (ref 4.0–15.2)

## 2021-06-29 LAB — RENAL FUNCTION PANEL
Albumin: 2 g/dL — ABNORMAL LOW (ref 3.5–5.0)
Albumin: 1.9 g/dL — ABNORMAL LOW (ref 3.5–5.0)
Anion gap: 7 (ref 5–15)
Anion gap: 9 (ref 5–15)
BUN: 26 mg/dL — ABNORMAL HIGH (ref 8–23)
BUN: 24 mg/dL — ABNORMAL HIGH (ref 8–23)
CO2: 26 mmol/L (ref 22–32)
CO2: 27 mmol/L (ref 22–32)
Calcium: 8.1 mg/dL — ABNORMAL LOW (ref 8.9–10.3)
Calcium: 8 mg/dL — ABNORMAL LOW (ref 8.9–10.3)
Chloride: 102 mmol/L (ref 98–111)
Chloride: 100 mmol/L (ref 98–111)
Creatinine, Ser: 1.5 mg/dL — ABNORMAL HIGH (ref 0.61–1.24)
Creatinine, Ser: 1.43 mg/dL — ABNORMAL HIGH (ref 0.61–1.24)
GFR, Estimated: 50 mL/min — ABNORMAL LOW (ref >60)
GFR, Estimated: 53 mL/min — ABNORMAL LOW (ref >60)
Glucose, Bld: 240 mg/dL — ABNORMAL HIGH (ref 70–99)
Glucose, Bld: 199 mg/dL — ABNORMAL HIGH (ref 70–99)
Phosphorus: 1.7 mg/dL — ABNORMAL LOW (ref 2.5–4.6)
Phosphorus: 3.3 mg/dL (ref 2.5–4.6)
Potassium: 4.8 mmol/L (ref 3.5–5.1)
Potassium: 4.5 mmol/L (ref 3.5–5.1)
Sodium: 135 mmol/L (ref 135–145)
Sodium: 136 mmol/L (ref 135–145)

## 2021-06-29 LAB — POCT ACTIVATED CLOTTING TIME: Activated Clotting Time: 202 seconds

## 2021-06-29 LAB — MAGNESIUM: Magnesium: 2.5 mg/dL — ABNORMAL HIGH (ref 1.7–2.4)

## 2021-06-29 LAB — GLUCOSE, CAPILLARY
Glucose-Capillary: 198 mg/dL — ABNORMAL HIGH (ref 70–99)
Glucose-Capillary: 200 mg/dL — ABNORMAL HIGH (ref 70–99)
Glucose-Capillary: 214 mg/dL — ABNORMAL HIGH (ref 70–99)
Glucose-Capillary: 216 mg/dL — ABNORMAL HIGH (ref 70–99)
Glucose-Capillary: 233 mg/dL — ABNORMAL HIGH (ref 70–99)
Glucose-Capillary: 236 mg/dL — ABNORMAL HIGH (ref 70–99)

## 2021-06-29 LAB — INSULIN-LIKE GROWTH FACTOR: Somatomedin C: 64 ng/mL (ref 59–230)

## 2021-06-29 LAB — APTT: aPTT: 174 seconds (ref 24–36)

## 2021-06-29 LAB — HSV 1/2 PCR, CSF
HSV-1 DNA: NEGATIVE
HSV-2 DNA: NEGATIVE

## 2021-06-29 LAB — LUTEINIZING HORMONE: LH: 9.4 m[IU]/mL — ABNORMAL HIGH (ref 1.7–8.6)

## 2021-06-29 LAB — FOLLICLE STIMULATING HORMONE: FSH: 2.3 m[IU]/mL (ref 1.5–12.4)

## 2021-06-29 IMAGING — DX DG CHEST 1V PORT
1 series · 1 of 1 positions shown · non-contrast
Comparison: [DATE]

CLINICAL DATA: Respiratory failure, endotracheal intubation

EXAM:
PORTABLE CHEST 1 VIEW

[chest ap]
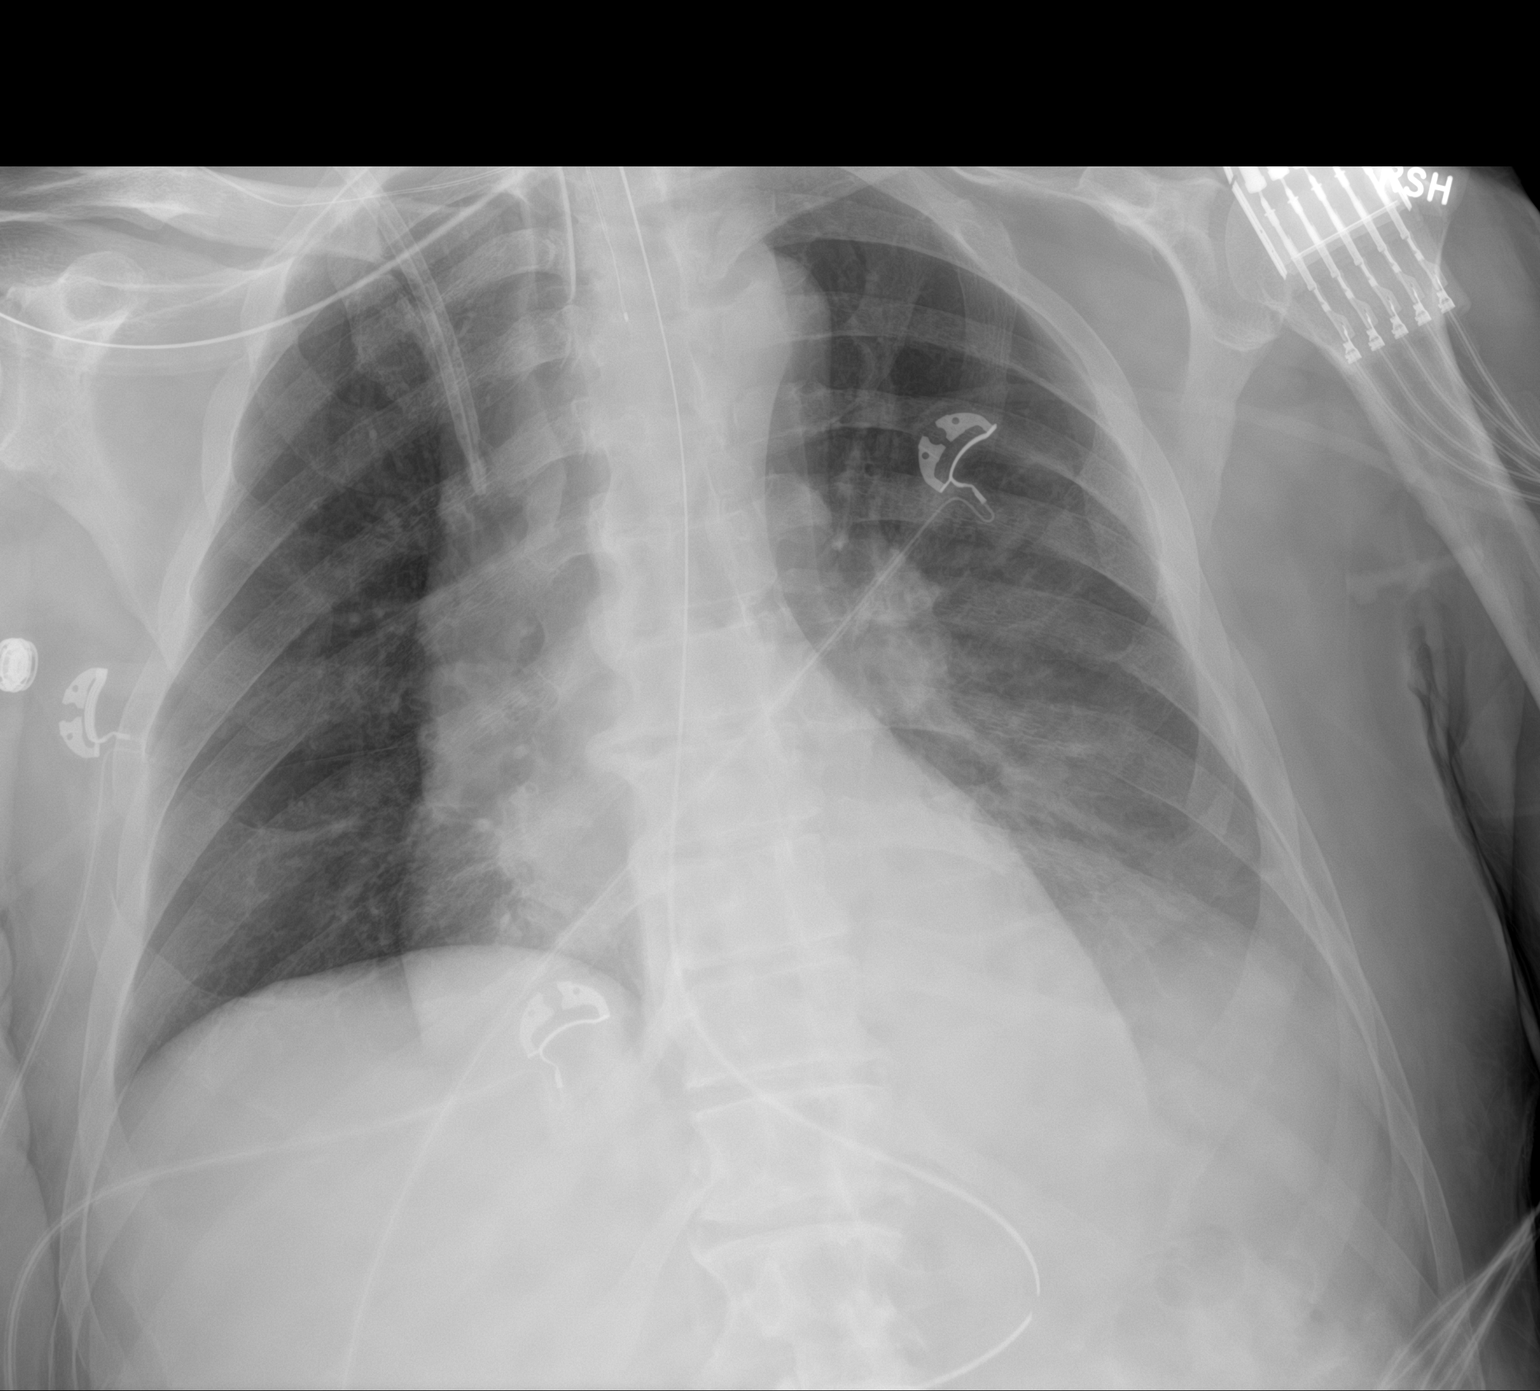

[1 of 1 positions shown; findings below may reference images not displayed]

FINDINGS: Unchanged support apparatus including endotracheal tube positioned
below the thoracic inlet. Esophagogastric tube with tip and side
port below the diaphragm. Right neck multi lumen vascular catheter,
tip over the SVC. Cardiomegaly. Probable small, layering left
pleural effusion. No acute airspace opacity.
IMPRESSION: 1. Unchanged support apparatus.
2. Cardiomegaly.
3. Probable small, layering left pleural effusion.
4. No acute airspace opacity.

## 2021-06-29 MED ORDER — SODIUM PHOSPHATES 45 MMOLE/15ML IV SOLN
30.0000 mmol | Freq: Once | INTRAVENOUS | Status: AC
  Administered 2021-06-29: 30 mmol via INTRAVENOUS
  Filled 2021-06-29: qty 10

## 2021-06-29 MED ORDER — MIDAZOLAM HCL 2 MG/2ML IJ SOLN
2.0000 mg | INTRAMUSCULAR | Status: DC | PRN
  Administered 2021-06-29 – 2021-07-02 (×5): 2 mg via INTRAVENOUS
  Filled 2021-06-29 (×5): qty 2

## 2021-06-29 MED ORDER — PROSOURCE TF PO LIQD
45.0000 mL | Freq: Four times a day (QID) | ORAL | Status: DC
  Administered 2021-06-29 – 2021-07-02 (×12): 45 mL
  Filled 2021-06-29 (×12): qty 45

## 2021-06-29 MED ORDER — B COMPLEX-C PO TABS
1.0000 | ORAL_TABLET | Freq: Every day | ORAL | Status: DC
  Administered 2021-06-29 – 2021-07-02 (×4): 1
  Filled 2021-06-29 (×4): qty 1

## 2021-06-29 NOTE — Progress Notes (Signed)
Inpatient Diabetes Program Recommendations  AACE/ADA: New Consensus Statement on Inpatient Glycemic Control   Target Ranges:  Prepandial:   less than 140 mg/dL      Peak postprandial:   less than 180 mg/dL (1-2 hours)      Critically ill patients:  140 - 180 mg/dL  Results for KHIRY, PASQUARIELLO" (MRN [id number]) as of 06/29/2021 13:16  Ref. Range 06/28/2021 07:29 06/28/2021 10:31 06/28/2021 15:21 06/28/2021 19:45 06/28/2021 23:23 06/29/2021 03:52 06/29/2021 07:37 06/29/2021 11:37  Glucose-Capillary Latest Ref Range: 70 - 99 mg/dL 199 (H) 189 (H) 165 (H) 197 (H) 205 (H) 236 (H) 233 (H) 198 (H)    Review of Glycemic Control  Current orders for Inpatient glycemic control: Novolog 0-9 units Q4H, Vital @ 50 ml/hr  Inpatient Diabetes Program Recommendations:    Insulin-Tube Feeding: Please consider ordering Novolog 3 units Q4H for tube feeding coverage. If tube feeding is stopped or held then Novolog tube feeding coverage should also be stopped or held.  Thanks, Barnie Alderman, RN, MSN, CDE Diabetes Coordinator Inpatient Diabetes Program [phone] (Team Pager from 8am to 5pm)

## 2021-06-29 NOTE — Consult Note (Signed)
Palliative Care Consult Note                                  Date: 06/29/2021   Patient Name: Scott Woods  DOB: [birth year]  MRN: [id number]  Age / Sex: 70 y.o., male  PCP: [name], PA-C Referring Physician: Candee Furbish, MD  Reason for Consultation: Establishing goals of care  HPI/Patient Profile: 70 y.o. 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  CT of the head was negative on admission and labs demonstrated creatinine of 6.2 (baseline 2.7) and potassium of 6.3.  He was admitted for further evaluation and treatment.  Unfortunately on 10/4 the patient became agitated, hypertensive and later suffered a PEA arrest for which a CODE BLUE was called and ROSC obtained after 20 minutes of CPR.  The patient was intubated and chest x-ray showed left pneumothorax post compressions.  Chest tube was placed by CCM and he was transferred to CVICU for further care.  Currently etiology for the arrest is unclear.  He was started on CRRT.  PMT was consulted for goals of care.  Past Medical History:  Diagnosis Date  . Adrenal nodule ([place])   . Blood in the urine 05/08/2015  . Borderline diabetes   . Chronic kidney disease   . CKD (chronic kidney disease), stage II   . Diabetes mellitus without complication ([place])   . Elevated PSA   . Gross hematuria   . H/O type B viral hepatitis 05/08/2015   When he was a child   . History of hepatitis B   . HTN (hypertension)   . Hyperlipidemia with target LDL less than 100   . Obesity   . Pre-ulcerative calluses 03/28/2016   great toe   . Prostate cancer ([place])     Subjective:   This NP Walden Field reviewed medical records, received report from team, assessed the patient and then meet at the patient's bedside to discuss diagnosis, prognosis, GOC, EOL  wishes disposition and options.  I met with the patient at the bedside, although he is unresponsive.  I called and spoke with his son Scott Woods and had an extensive discussion as detailed below.   Concept of Palliative Care was introduced as specialized medical care for people and their families living with serious illness.  If focuses on providing relief from the symptoms and stress of a serious illness.  The goal is to improve quality of life for both the patient and the family. Values and goals of care important to patient and family were attempted to be elicited.  Created space and opportunity for patient  and family to explore thoughts and feelings regarding current medical situation   Natural trajectory and current clinical status were discussed. Questions and concerns addressed. Patient  encouraged to call with questions or concerns.    Patient/Family Understanding of Illness: The patient's son is a paramedic and his wife is a Merchandiser, retail.  He understands that the patient initially had normal MRI, he has kidney failure and is on dialysis continuously.  He was admitted for a fall and was oriented x4 on admission but suffered a PEA arrest and regained ROSC.  He knows that he is on CRRT, off sedation, receiving tube feedings.  He understands that the patient  is not likely to regain consciousness.  Today's Discussion: The patient has 2 children, a son and a daughter.  The patient's son states that they have previously tried to talk to the patient about his health and making advance care planning decisions but he has declined and avoided the subject.  We discussed the patient's current situation including unresponsive, nonpurposeful movement, intermittent posturing to pain.  We discussed that his labs show essentially near normal kidney function on CRRT and nephrology feels that his altered mental status is not likely due from uremia.  Neurology is scheduled to see the patient again today to  attempt to further evaluate and make prognostication.  Overall, there is an impression that he likely has anoxic brain injury from his extensive CPR after PEA arrest.  We discussed possible decisions that will need to be made in the future including whether to continue aggressive care, whether he is a candidate for intermittent hemodialysis or trach placement (dependent on his neurological prognosis) versus shift to comfort focus.  The patient's son seems to favor a shift to comfort focus but states he wants to talk to his sister and his aunt.  He asked if there is a deadline for a decision which I explained there is not.  I expressed that we would like to get neurology's opinion before making any of these difficult decisions.  He verbalized understanding.  The patient states he will need to find a delicate way to explain to his sister what is going on.  I discussed that the only decision that should be made sooner rather than later is on CODE STATUS as he is currently listed for full code.  I explained that he is very small chance of surviving additional cardiac arrests and it would not improve his neurological function and the recommendation would be for a DNR status.  He states that he fully agrees.  He will speak to his sister this evening and call the floor with a decision.  Review of Systems  Unable to perform ROS: Intubated   Objective:   Primary Diagnoses: Present on Admission: . Hyperkalemia . Syncope . Hypertension, goal below 140/90 . Hyperlipidemia LDL goal <100 . Anemia due to chronic kidney disease   Physical Exam Constitutional:      Appearance: He is normal weight. He is ill-appearing and toxic-appearing.     Interventions: He is intubated.  HENT:     Head: Normocephalic and atraumatic.  Cardiovascular:     Rate and Rhythm: Normal rate.  Pulmonary:     Effort: No respiratory distress. He is intubated.  Abdominal:     General: Abdomen is flat.     Palpations: Abdomen is  soft.  Skin:    General: Skin is warm and dry.  Neurological:     Mental Status: He is unresponsive.    Vital Signs:  BP 133/65   Pulse (!) 112   Temp 98.8 F (37.1 C)   Resp 18   Ht $R'5\' 8"'lU$  (1.727 m)   Wt 73 kg   SpO2 100%   BMI 24.47 kg/m   Palliative Assessment/Data: 20%    Advanced Care Planning:   Primary Decision Maker: NEXT OF KIN  Code Status/Advance Care Planning: Full code awaiting family discussion/decision this evening  A discussion was had today regarding advanced directives. Concepts specific to code status, artifical feeding and hydration, continued IV antibiotics and rehospitalization was had.  The difference between a aggressive medical intervention path and a palliative comfort care path for  this patient at this time was had.  Decisions/Changes to ACP: None at this time  Assessment & Plan:   Impression: 69 year old male with multiple chronic illnesses who comes in after a fall and subsequent in-hospital PEA cardiac arrest resulting in intubation, neurological compromise.  He has end-stage kidney disease and is currently on CRRT.  All appearances at this time seem to indicate anoxic brain injury with unlikely improvement to be expected.  However, neurology has been asked to weigh in.  Extensive discussion had with the patient's son will discuss with his other family members about possible options.  We will touch base again when we have a neurology opinion and try to get further decisions made.  SUMMARY OF RECOMMENDATIONS   Remain DNR until family can make a decision this evening about CODE STATUS Continue to treat the treatable Ongoing discussions for decisions/goals of care based on neurology evaluation PMT will continue to follow  Symptom Management:  Per primary team/PCCM [facility] is available to assist as needed  Prognosis:  Unable to determine  Discharge Planning:  To Be Determined   Discussed with: Patient family, medical team, nursing  team    Thank you for allowing [place] to participate in the care of KHALIEL MOREY PMT will continue to support holistically.  Time Total: 70 min  Greater than 50%  of this time was spent counseling and coordinating care related to the above assessment and plan.  Signed by: Walden Field, NP Palliative Medicine Team  Team Phone # [phone] (Nights/Weekends)  06/29/2021, 4:03 PM

## 2021-06-29 NOTE — Progress Notes (Signed)
Patient ID: Scott Woods, male   DOB: 1950/12/30, 70 y.o.   MRN: [id number] [facility] Progress Note   Assessment/ Plan:   1. Acute kidney Injury on chronic kidney disease stage III (baseline creatinine 1.5 prior to severe AKI 5 months ago): Suspected hemodynamically mediated with ATN in the setting of PEA arrest.  Oliguric overnight with 237 cc urine output charted-we will check CVP today to decide on whether he needs to continue to be net negative from a fluid standpoint.  With his current labs, uremia/azotemia should not be a contributor to metabolic encephalopathy and would be an optimal point for neurology assessment to help decide on prognostication/extent of hypoxic/anoxic brain injury. 2.  Status post in-hospital PEA arrest: Occurred on 10/4 with 20 minutes of ACLS before ROSC and etiology remains unclear.  He remains on pressors/intubated. 3.  Acute encephalopathy: Status post PEA arrest with concern for hypoxic injury and seen by neurology.  Uremic/azotemic encephalopathy unlikely to be contributing at this point based on labs and CRRT-we will await neurology reevaluation to decide on trajectory of care including consideration of palliative care. 4.  Anemia: Decreasing hemoglobin and hematocrit noted on labs this morning-we will continue to follow trend and transfuse for hemoglobin <7.  Subjective:   Continues to tolerate CRRT without problems overnight on fixed dose heparin   Objective:   BP (!) 99/59   Pulse 86   Temp 99 F (37.2 C)   Resp 19   Ht 5' 8" (1.727 m)   Wt 73 kg   SpO2 99%   BMI 24.47 kg/m   Intake/Output Summary (Last 24 hours) at 06/29/2021 0725 Last data filed at 06/29/2021 0700 Gross per 24 hour  Intake 2280.81 ml  Output 2646 ml  Net -365.19 ml   Weight change:   Physical Exam: Gen: Intubated, sedated, unresponsive to loud noise but withdraws to pain. CVS: Pulse regular rhythm, normal rate, S1 and S2 normal Resp: Clear to auscultation,  no rales/rhonchi Abd: Soft, flat, nontender, bowel sounds normal Ext: No lower extremity edema  Imaging: No results found.  Labs: BMET Recent Labs  Lab 06/26/21 0038 06/26/21 0342 06/26/21 0904 06/26/21 1800 06/27/21 0502 06/27/21 2047 06/28/21 0539 06/28/21 1513 06/29/21 0438  NA 146*   < > 145 147* 141 139 137 135 135  K 5.3*   < > 4.9 5.1 4.8 5.2* 5.0 4.7 4.8  CL 114*  --  115* 115* 107 104 104 102 102  CO2 21*  --  20* 21* _0 GLUCOSE 168*  --  203* 121* 177* 151* 232* 190* 240*  BUN 61*  --  63* 68* 42* 46* 39* 34* 26*  CREATININE 5.56*  --  5.58* 5.63* 3.11* 2.69* 2.08* 1.70* 1.50*  CALCIUM 9.1  --  8.8* 8.9 8.7* 8.3* 8.1* 8.0* 8.1*  PHOS 5.9*  --   --  5.2* 3.0 3.1 2.8 1.9* 1.7*   < > = values in this interval not displayed.   CBC Recent Labs  Lab 06/23/21 0123 06/23/21 0837 06/26/21 0904 06/27/21 0524 06/28/21 0832 06/29/21 0438  WBC 6.8   < > 10.1 13.2* 16.7* 14.1*  NEUTROABS 5.4  --   --   --   --   --   HGB 8.3*   < > 9.2* 9.5* 8.2* 7.0*  HCT 23.9*   < > 27.9* 29.7* 25.3* 22.0*  MCV 92.3   < > 96.5 97.4 98.8 97.8  PLT 157   < >  218 225 231 220   < > = values in this interval not displayed.    Medications:     acetaminophen  650 mg Oral Q4H   Or   acetaminophen (TYLENOL) oral liquid 160 mg/5 mL  650 mg Per Tube Q4H   Or   acetaminophen  650 mg Rectal Q4H   alteplase  2 mg Intracatheter Once   atorvastatin  40 mg Per Tube QHS   bethanechol  10 mg Per Tube TID   chlorhexidine gluconate (MEDLINE KIT)  15 mL Mouth Rinse BID   Chlorhexidine Gluconate Cloth  6 each Topical Daily   docusate  100 mg Per Tube BID   feeding supplement (PROSource TF)  45 mL Per Tube Daily   heparin injection (subcutaneous)  5,000 Units Subcutaneous Q8H   insulin aspart  0-9 Units Subcutaneous Q4H   lidocaine-EPINEPHrine  30 mL Other Once   mouth rinse  15 mL Mouth Rinse 10 times per day   pantoprazole (PROTONIX) IV  40 mg Intravenous Daily   polyethylene  glycol  17 g Per Tube Daily   sodium chloride flush  10 mL Other Q8H   Elmarie Shiley, MD 06/29/2021, 7:25 AM

## 2021-06-29 NOTE — Progress Notes (Signed)
[facility] [place] Progress Note Patient Name: Scott Woods DOB: 1951/01/28 MRN: [id number]   Date of Service  06/29/2021  HPI/Events of Note  - pt quite tremulous - setting off vent alarms; is dyssynchronous  eICU Interventions  - added prn versed 2mg  q2hr iv     Intervention Category Intermediate Interventions: Respiratory distress - evaluation and management  Tilden Dome 06/29/2021, 10:47 PM

## 2021-06-29 NOTE — Progress Notes (Signed)
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 PMHx significant for HTN, HLD, T2DM, CKD stage IV, history of Hepatitis B, adrenal nodule, prostate CA.  On admission, patient denied any preceding symptoms prior to syncopal episode. CT Head was negative. Labs demonstrated Cr 6.2 (baseline 2.7) and K 6.3.  Hyperkalemia improved with Lokelma and insulin.  He was admitted to the hospitalist service.  Overnight 10/4, patient became agitated and hypertensive and was given Ativan and hydralazine.  A short time later he suffered a PEA arrest. Code Blue was called and ROSC obtained after 20 minutes CPR. Patient was intubated with post-intubation CXR showing L pneumothorax. Chest tube was placed by CCM.  Transferred to CVICU for further care.  Pertinent Medical History:   Past Medical History:  Diagnosis Date   Adrenal nodule ([place])    Blood in the urine 05/08/2015   Borderline diabetes    Chronic kidney disease    CKD (chronic kidney disease), stage II    Diabetes mellitus without complication (HCC)    Elevated PSA    Gross hematuria    H/O type B viral hepatitis 05/08/2015   When he was a child    History of hepatitis B    HTN (hypertension)    Hyperlipidemia with target LDL less than 100    Obesity    Pre-ulcerative calluses 03/28/2016   great toe    Prostate cancer ([place])    Significant Hospital Events: Including procedures, antibiotic start and stop dates in addition to other pertinent events   9/29 admitted to hospitalist service 10/4 PEA arrest, intubation, transfer to [place], left chest tube placed for post-code PTX. LTM EEG, c/f myoclonus. CT Head NAICA. Bedside TEE negative for dissection. 10/5 sedation turned  off 10/6 intermittently hypertensive, remains on LTM, sCr 5.4-> 5.8 but still making urine, remains off sedation, weaning on PSV 10/5 10/7 LTM d/c'd, stable sCr, improving UOP, remains off sedation, weaning  Interim History / Subjective:  Remains on CRRT.  Remains unresponsive.  Decorticate posturing with stimulation.  Objective:  Blood pressure (!) 128/48, pulse 83, temperature 99 F (37.2 C), resp. rate 16, height 5\' 8"  (1.727 m), weight 73 kg, SpO2 100 %.    Vent Mode: SIMV;PRVC;PSV FiO2 (%):  [30 %-40 %] 30 % Set Rate:  [6 bmp] 6 bmp Vt Set:  [500 mL] 500 mL PEEP:  [5 cmH20] 5 cmH20 Pressure Support:  [10 cmH20] 10 cmH20 Plateau Pressure:  [13 cmH20-16 cmH20] 15 cmH20   Intake/Output Summary (Last 24 hours) at 06/29/2021 0839 Last data filed at 06/29/2021 0800 Gross per 24 hour  Intake 2270.58 ml  Output 2639 ml  Net -368.42 ml    Filed Weights   06/26/21 0500 06/27/21 0500 06/29/21 0500  Weight: 74 kg 68.3 kg 73 kg   Physical Examination: General:  elderly male, intubated critically ill HEENT: [place]/AT, MMM Neuro: rigid at times, ? intermittent myoclonus and decorticate posturing with stimulation CV: RRR PULM: BL vented breaths  GI: BS soft nt nd  Extremities: no edema  Skin: no rash   Assessment & Plan:   Witnessed in-hospital PEA arrest 10/4, unclear etiology 20 minutes ACLS before ROSC, currently unclear etiology. K WNL prior to code. PE considered,  but unlikely as Echo 10/2 without e/o R heart strain. - S/p TEE 10/4 without evidence of dissection P: Supportive care  Continue levophed as needed for goal MAP > 65 Precedex for sedation   Acute hypoxic respiratory failure 2/2 PEA arrest Left Pneumothorax - secondary to CPR, s/p chest tube placement which has since been removed P:  Remains on full vent support  Wean as mental status allows, so far not a candidate for weaning trial If doesn't improve may need to consider trach though likely not a great  candidate Palliative care consult for goals of care discussions  Acute encephalopathy post-PEA arrest, concern for anoxic brain injury.  MRI and EEG neg.  LP 10/8 neg though neuro checking with lab for some add on labs (IgG index, OCB) P: Neuro following, appreciate the assistance Continue cvvhd to see if this helps, neuro hopeful for ths Neuro considering workup of pituitary adenoma as outpatient  Normocytic Anemia, last transfused 10/4, 1u PRBC P: follow closely, currently Hgb 7 but no signs of bleeding thus far so holding off on transfusion for now  Acute kidney injury on chronic CKD stage IV, non-oliguric BPH NAGMA Hypophosphatemia - s/p repletion P: Appreciate nephrology work up Continue cvvhd for now Levophed PRN to support CVVHD BMP and I&Os Follow electrolytes   Hypertension P: Norvasc,  PRN labetalol    Best Practice: (right click and "Reselect all SmartList Selections" daily)   Diet/type: NPO; TF  DVT prophylaxis: prophylactic heparin  GI prophylaxis: PPI Lines: N/A Foley:  N/A Code Status:  full code Last date of multidisciplinary goals of care discussion: pending 10/8  CC time: 30 min.   Montey Hora, [facility] For pager details, please see AMION or use Epic chat  After 1900, please call [facility] for cross coverage needs 06/29/2021, 8:39 AM

## 2021-06-29 NOTE — Progress Notes (Signed)
Nutrition Follow-up  DOCUMENTATION CODES:   Not applicable  INTERVENTION:   Tube Feeding via OG:  Vital 1.5 at 50 ml/hr Increase Pro-Source TF 45 mL to QID Provides 1960 kcals, 125 g of protein and 912 mL of free water  Add B-complex with C while on CRRT  Aggressive supplementation of phosphorus, especially while on CRRT  Consider changing bowel regimen to PRN given liquid stool via rectal tube  NUTRITION DIAGNOSIS:   Inadequate oral intake related to inability to eat as evidenced by NPO status.  Being addressed via TF   GOAL:   Patient will meet greater than or equal to 90% of their needs  Met via TF  MONITOR:   Vent status, Labs, Weight trends, TF tolerance, I & O's  REASON FOR ASSESSMENT:   Ventilator, Consult Enteral/tube feeding initiation and management  ASSESSMENT:   70 year old male who presented to the ED on 9/29 after falling 10-12 feet. PMH of CKD stage IV, T2DM, HTN, HLD, BPH, anemia, hepatitis B. Pt admitted with acute renal failure on CKD stage IV, syncope.  09/29 Admitted 10/04 PEA arrest, intubated, insertion of chest tube for pneumothorax, s/p TEE showing no aortic dissection 10/08 CRRT initiated  Pt remains on vent support, on CRRT, remains on levophed. Oliguric Remains unresponsive, decorticate posturing with stimulation  Tolerating Vital 1.5 at 50 ml/hr via OG  Pt with liquid stool via rectal tube, not large quantities but loose and on stool softeners. 340 mL in 24 hours. 200 mL charted today  Weight fluctuating; current wt 73 kg; noted weight of 68 kg on admission  Consider adjusting insulin regimen to CBG goal of 140-180; currently 165-236  Labs: phosphorus 1.7 (L), CBGs 165-236 (ICU goal 140-180) Meds: sodium phosphate, miralax, colace, ss novolog   Diet Order:   Diet Order             Diet NPO time specified  Diet effective now                   EDUCATION NEEDS:   No education needs have been identified at this  time  Skin:  Skin Assessment: Reviewed RN Assessment  Last BM:  10/11 via FMS  Height:   Ht Readings from Last 1 Encounters:  06/22/21 _0  (1.727 m)    Weight:   Wt Readings from Last 1 Encounters:  06/29/21 73 kg   BMI:  Body mass index is 24.47 kg/m.  Estimated Nutritional Needs:   Kcal:  1800-2000 kcals  Protein:  110-145  Fluid:  1.7 L    Kerman Passey MS, RDN, LDN, CNSC Registered Dietitian III Clinical Nutrition RD Pager and On-Call Pager Number Located in [place]

## 2021-06-29 NOTE — Plan of Care (Signed)

## 2021-06-29 NOTE — Progress Notes (Signed)
Palliative consulted for patient on 10/11. Per report, family spoke with palliative and once discussed with patient son and daughter, family has decided to make patient DNR. Chart reflects code status as FULL. This RN called elink for verification. New shift unable to clarify order from previous shift. This RN called patient son, Acelin Ferdig to verify code status. Per Zenia Resides, family did chose to make patient DNR. This RN informed Lysbeth Galas with elink so order can reflect family wishes.

## 2021-06-29 NOTE — Progress Notes (Signed)
eLink Physician-Brief Progress Note Patient Name: Scott Woods DOB: [birth year] MRN: [id number]   Date of Service  06/29/2021  HPI/Events of Note  Pt's family spoke to palliative care team.  The pt's decision makers wish to have DNR orders in place.  eICU Interventions  - spoke to pt's son, Zenia Resides ([phone]) - confirmed this is the decision makers' plan - placed DNR order in chart     Intervention Category Minor Interventions: Communication with other healthcare providers and/or family  Tilden Dome 06/29/2021, 8:45 PM

## 2021-06-30 ENCOUNTER — Inpatient Hospital Stay (HOSPITAL_COMMUNITY): Payer: Medicare (Managed Care)

## 2021-06-30 DIAGNOSIS — T17908D Unspecified foreign body in respiratory tract, part unspecified causing other injury, subsequent encounter: Secondary | ICD-10-CM

## 2021-06-30 DIAGNOSIS — J9690 Respiratory failure, unspecified, unspecified whether with hypoxia or hypercapnia: Secondary | ICD-10-CM

## 2021-06-30 DIAGNOSIS — I469 Cardiac arrest, cause unspecified: Secondary | ICD-10-CM | POA: Diagnosis not present

## 2021-06-30 LAB — RENAL FUNCTION PANEL
Albumin: 1.9 g/dL — ABNORMAL LOW (ref 3.5–5.0)
Albumin: 1.9 g/dL — ABNORMAL LOW (ref 3.5–5.0)
Anion gap: 6 (ref 5–15)
Anion gap: 10 (ref 5–15)
BUN: 26 mg/dL — ABNORMAL HIGH (ref 8–23)
BUN: 43 mg/dL — ABNORMAL HIGH (ref 8–23)
CO2: 27 mmol/L (ref 22–32)
CO2: 24 mmol/L (ref 22–32)
Calcium: 8.1 mg/dL — ABNORMAL LOW (ref 8.9–10.3)
Calcium: 8.2 mg/dL — ABNORMAL LOW (ref 8.9–10.3)
Chloride: 100 mmol/L (ref 98–111)
Chloride: 101 mmol/L (ref 98–111)
Creatinine, Ser: 1.36 mg/dL — ABNORMAL HIGH (ref 0.61–1.24)
Creatinine, Ser: 2.13 mg/dL — ABNORMAL HIGH (ref 0.61–1.24)
GFR, Estimated: 56 mL/min — ABNORMAL LOW (ref >60)
GFR, Estimated: 33 mL/min — ABNORMAL LOW (ref >60)
Glucose, Bld: 217 mg/dL — ABNORMAL HIGH (ref 70–99)
Glucose, Bld: 231 mg/dL — ABNORMAL HIGH (ref 70–99)
Phosphorus: 2.5 mg/dL (ref 2.5–4.6)
Phosphorus: 3.7 mg/dL (ref 2.5–4.6)
Potassium: 4.8 mmol/L (ref 3.5–5.1)
Potassium: 5.3 mmol/L — ABNORMAL HIGH (ref 3.5–5.1)
Sodium: 133 mmol/L — ABNORMAL LOW (ref 135–145)
Sodium: 135 mmol/L (ref 135–145)

## 2021-06-30 LAB — TSH: TSH: 2.997 u[IU]/mL (ref 0.350–4.500)

## 2021-06-30 LAB — CBC
HCT: 21.2 % — ABNORMAL LOW (ref 39.0–52.0)
Hemoglobin: 6.9 g/dL — CL (ref 13.0–17.0)
MCH: 32.2 pg (ref 26.0–34.0)
MCHC: 32.5 g/dL (ref 30.0–36.0)
MCV: 99.1 fL (ref 80.0–100.0)
Platelets: 223 10*3/uL (ref 150–400)
RBC: 2.14 MIL/uL — ABNORMAL LOW (ref 4.22–5.81)
RDW: 13.5 % (ref 11.5–15.5)
WBC: 13.7 10*3/uL — ABNORMAL HIGH (ref 4.0–10.5)
nRBC: 0.3 % — ABNORMAL HIGH (ref 0.0–0.2)

## 2021-06-30 LAB — HEMOGLOBIN AND HEMATOCRIT, BLOOD
HCT: 20.4 % — ABNORMAL LOW (ref 39.0–52.0)
HCT: 24.6 % — ABNORMAL LOW (ref 39.0–52.0)
Hemoglobin: 6.5 g/dL — CL (ref 13.0–17.0)
Hemoglobin: 7.9 g/dL — ABNORMAL LOW (ref 13.0–17.0)

## 2021-06-30 LAB — PREPARE RBC (CROSSMATCH)

## 2021-06-30 LAB — VITAMIN B12: Vitamin B-12: 118 pg/mL — ABNORMAL LOW (ref 180–914)

## 2021-06-30 LAB — MAGNESIUM: Magnesium: 2.6 mg/dL — ABNORMAL HIGH (ref 1.7–2.4)

## 2021-06-30 LAB — ACTH: C206 ACTH: 12 pg/mL (ref 7.2–63.3)

## 2021-06-30 LAB — APTT: aPTT: 54 seconds — ABNORMAL HIGH (ref 24–36)

## 2021-06-30 LAB — GLUCOSE, CAPILLARY
Glucose-Capillary: 198 mg/dL — ABNORMAL HIGH (ref 70–99)
Glucose-Capillary: 198 mg/dL — ABNORMAL HIGH (ref 70–99)
Glucose-Capillary: 205 mg/dL — ABNORMAL HIGH (ref 70–99)
Glucose-Capillary: 207 mg/dL — ABNORMAL HIGH (ref 70–99)
Glucose-Capillary: 225 mg/dL — ABNORMAL HIGH (ref 70–99)
Glucose-Capillary: 234 mg/dL — ABNORMAL HIGH (ref 70–99)

## 2021-06-30 LAB — AMMONIA: Ammonia: 12 umol/L (ref 9–35)

## 2021-06-30 MED ORDER — SODIUM CHLORIDE 0.9% IV SOLUTION: Freq: Once | INTRAVENOUS | Status: DC

## 2021-06-30 MED ORDER — SODIUM ZIRCONIUM CYCLOSILICATE 10 G PO PACK
10.0000 g | PACK | Freq: Three times a day (TID) | ORAL | Status: AC
  Administered 2021-06-30 – 2021-07-01 (×5): 10 g
  Filled 2021-06-30 (×6): qty 1

## 2021-06-30 NOTE — Progress Notes (Signed)
eLink Physician-Brief Progress Note Patient Name: Scott Woods DOB: 02-28-1951 MRN: [id number]   Date of Service  06/30/2021  HPI/Events of Note  Hgb was 7 yesterday; and 6.9 today  eICU Interventions  - no signs of bleeding - recheck H/H at 0800hrs     Intervention Category Minor Interventions: Other:  Tilden Dome 06/30/2021, 3:55 AM

## 2021-06-30 NOTE — Plan of Care (Addendum)
Spoke with patient's son, Scott Woods, discussed current clinical picture and options going forward. Neurology suspects anoxic brain injury despite unremarkable MRI and will check one more EEG today to ensure no subclinical seizure activity.  Pt's neuro exam has not significantly improved with CRRT and this was stopped today.  Scott Woods is unsure that Pt would have wanted trach/peg.  We discussed waiting to see if Pt's renal recovery continues without dialysis, if it does it may be reasonable to consider trach/peg if family opts for aggressive care.  Scott Woods will reach out to extended family and  the Palliative care team as well to further discuss and we will talk again tomorrow.    Otilio Carpen Aika Brzoska, PA-C

## 2021-06-30 NOTE — Procedures (Signed)
Patient Name: Scott Woods  MRN: [id number]  Epilepsy Attending: Lora Havens  Referring Physician/Provider: Dr Zeb Comfort Date: 06/30/2021 Duration: 42.19 mins   Patient history:  70 year old male status post cardiac arrest.  EEG to evaluate for seizures.   Level of alertness: Comatose   AEDs during EEG study: None   Technical aspects: This EEG study was done with scalp electrodes positioned according to the 10-20 International system of electrode placement. Electrical activity was acquired at a sampling rate of 500Hz  and reviewed with a high frequency filter of 70Hz  and a low frequency filter of 1Hz . EEG data were recorded continuously and digitally stored.    Description: EEG showed continuous generalized 3 to 5 Hz theta-delta slowing. Generalized periodic discharges with triphasic morphology at 1-1.5 Hz were also noted. Hyperventilation and photic stimulation were not performed.      ABNORMALITY -Periodic discharges with triphasic morphology, generalized -Continued slow, generalized   IMPRESSION: This study showed showed generalized periodic discharges with triphasic morphology at 1-1.5 Hz. This EEG pattern is on the ictal-interictal continuum.  However, the morphology of discharges, frequency as well as reactivity to stimulation can also be seen due to toxic-metabolic causes like renal failure, hyperammonemia, cefepime toxicity, anoxic-hypoxic brain injury.  Additionally there is severe diffuse encephalopathy. No definite seizures were seen during the study.  With h/o cardiac arrest, this eeg can be suggestive of anoxic-hypoxic brain injury.    Scott Woods Scott Woods

## 2021-06-30 NOTE — Progress Notes (Signed)
Daily Progress Note   Patient Name: Scott Woods       Date: 06/30/2021 DOB: 14-Feb-1951  Age: 70 y.o. MRN#: [id number] Attending Physician: Candee Furbish, MD Primary Care Physician: [name], PA-C Admit Date: 06/13/2021 Length of Stay: 13 days  Reason for Consultation/Follow-up: Establishing goals of care  HPI/Patient Profile:  70 y.o. 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  CT of the head was negative on admission and labs demonstrated creatinine of 6.2 (baseline 2.7) and potassium of 6.3.  He was admitted for further evaluation and treatment.   Unfortunately on 10/4 the patient became agitated, hypertensive and later suffered a PEA arrest for which a CODE BLUE was called and ROSC obtained after 20 minutes of CPR.  The patient was intubated and chest x-ray showed left pneumothorax post compressions.  Chest tube was placed by CCM and he was transferred to CVICU for further care.  Currently etiology for the arrest is unclear.  He was started on CRRT.   PMT was consulted for goals of care.  Subjective:   Subjective: Chart Reviewed. Updates received. Patient Assessed. Created space and opportunity for patient  and family to explore thoughts and feelings regarding current medical situation.  Today's Discussion: Today I saw the patient at the bedside.  He is essentially not very responsive.  I spoke with the pulmonary care APP and nursing staff.  Essentially the patient is stable.  Neurology has recommended a 24-hour EEG to check for seizure activity.  Kidney function seems to be improved and CRRT was discontinued.  They are closely watching his kidney function.  Is a question of possible some minimal responsiveness.  Overall neurology feels that he is unlikely to have any further recovery.  PCCM has been in contact with the patient's  son and, being that he is in the medical field, he is aware of what trach and PEG would commit him to.  I was informed that the son plans to discuss with family today and will contact palliative medicine tomorrow for further discussions/decisions.  Decision has been made to allow time for decision making and to follow-up with the patient tomorrow or Friday.  Review of Systems  Unable to perform ROS: Intubated   Objective:   Vital Signs:  BP (!) 158/59   Pulse (!) 125   Temp 99.9 F (37.7 C)   Resp (!) 21   Ht 5\' 8"  (1.727 m)   Wt 71.4 kg   SpO2 100%   BMI 23.93 kg/m   Physical Exam: Physical Exam Vitals and nursing note reviewed.  Constitutional:      General: He is not in acute distress.    Appearance: He is ill-appearing.  HENT:     Head: Normocephalic and atraumatic.  Cardiovascular:     Rate and Rhythm: Normal rate.     Heart sounds: No murmur heard. Pulmonary:     Effort: Pulmonary effort is normal. No respiratory distress.  Abdominal:     General: Abdomen is flat.     Palpations: Abdomen is soft.    Palliative Assessment/Data: 20% (intubated, feeding tube, o/w NPO)   Assessment & Plan:   Impression: Present on Admission: . Hyperkalemia . Syncope . Hypertension, goal below 140/90 . Hyperlipidemia LDL goal <100 . Anemia due to chronic kidney disease  70 year old male with multiple chronic illnesses who comes  in after a fall and subsequent in-hospital PEA cardiac arrest resulting in intubation, neurological compromise.  He has end-stage kidney disease and is currently on CRRT.  All appearances at this time seem to indicate anoxic brain injury with unlikely improvement to be expected.  However, neurology has been asked to weigh in.  Patient's family has made him a DNR. Neurology feels unlikely significant further improvement.  Family is having discussions for possible decisions.  SUMMARY OF RECOMMENDATIONS   Remain DNR Complete 24-hour EEG Allow time for  family discussions for decision making PMT will continue to follow Anticipate follow-up/decision making tomorrow versus Friday  Code Status: DNR  Prognosis: Unable to determine  Discharge Planning: To Be Determined  Discussed with: Medical team, nursing team  Thank you for allowing [place] to participate in the care of Scott Woods PMT will continue to support holistically.  Time Total: 35 min  Visit consisted of counseling and education dealing with the complex and emotionally intense issues of symptom management and palliative care in the setting of serious and potentially life-threatening illness. Greater than 50%  of this time was spent counseling and coordinating care related to the above assessment and plan.  Walden Field, NP Palliative Medicine Team  Team Phone # [phone] (Nights/Weekends)  05/18/2021, 8:17 AM

## 2021-06-30 NOTE — Progress Notes (Signed)
Inpatient Diabetes Program Recommendations  AACE/ADA: New Consensus Statement on Inpatient Glycemic Control  Target Ranges:  Prepandial:   less than 140 mg/dL      Peak postprandial:   less than 180 mg/dL (1-2 hours)      Critically ill patients:  140 - 180 mg/dL   Results for MARSEL, GAIL" (MRN [id number]) as of 06/30/2021 12:23  Ref. Range 06/29/2021 07:37 06/29/2021 11:37 06/29/2021 15:41 06/29/2021 19:24 06/29/2021 23:39 06/30/2021 03:27 06/30/2021 07:30 06/30/2021 12:06  Glucose-Capillary Latest Ref Range: 70 - 99 mg/dL 233 (H) 198 (H) 214 (H) 216 (H) 200 (H) 198 (H) 234 (H) 225 (H)   Review of Glycemic Control   Current orders for Inpatient glycemic control: Novolog 0-9 units Q4H, Vital @ 50 ml/hr   Inpatient Diabetes Program Recommendations:     Insulin-Tube Feeding: Please consider ordering Novolog 3 units Q4H for tube feeding coverage. If tube feeding is stopped or held then Novolog tube feeding coverage should also be stopped or held.   Thanks, Barnie Alderman, RN, MSN, CDE Diabetes Coordinator Inpatient Diabetes Program [phone] (Team Pager from 8am to 5pm)

## 2021-06-30 NOTE — Progress Notes (Signed)
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 PMHx significant for HTN, HLD, T2DM, CKD stage IV, history of Hepatitis B, adrenal nodule, prostate CA.  On admission, patient denied any preceding symptoms prior to syncopal episode. CT Head was negative. Labs demonstrated Cr 6.2 (baseline 2.7) and K 6.3.  Hyperkalemia improved with Lokelma and insulin.  He was admitted to the hospitalist service.  Overnight 10/4, patient became agitated and hypertensive and was given Ativan and hydralazine.  A short time later he suffered a PEA arrest. Code Blue was called and ROSC obtained after 20 minutes CPR. Patient was intubated with post-intubation CXR showing L pneumothorax. Chest tube was placed by CCM.  Transferred to CVICU for further care.  Pertinent Medical History:   Past Medical History:  Diagnosis Date   Adrenal nodule ([place])    Blood in the urine 05/08/2015   Borderline diabetes    Chronic kidney disease    CKD (chronic kidney disease), stage II    Diabetes mellitus without complication (HCC)    Elevated PSA    Gross hematuria    H/O type B viral hepatitis 05/08/2015   When he was a child    History of hepatitis B    HTN (hypertension)    Hyperlipidemia with target LDL less than 100    Obesity    Pre-ulcerative calluses 03/28/2016   great toe    Prostate cancer ([place])    Significant Hospital Events: Including procedures, antibiotic start and stop dates in addition to other pertinent events   9/29 admitted to hospitalist service 10/4 PEA arrest, intubation, transfer to [place], left chest tube placed for post-code PTX. LTM EEG, c/f myoclonus. CT Head NAICA. Bedside TEE negative for dissection. 10/5 sedation turned  off 10/6 intermittently hypertensive, remains on LTM, sCr 5.4-> 5.8 but still making urine, remains off sedation, weaning on PSV 10/5 10/7 LTM d/c'd, stable sCr, improving UOP, remains off sedation, weaning  Interim History / Subjective:  Off pressors today Plan to stop CRRT Family met with palliative care yesterday, son leaning towards comfort care, wanted to discuss further with family, DNR  Objective:  Blood pressure 115/64, pulse (!) 120, temperature 99.3 F (37.4 C), resp. rate 18, height $RemoveBe'5\' 8"'BGrSlddFJ$  (1.727 m), weight 71.4 kg, SpO2 100 %. CVP:  [4 mmHg] 4 mmHg  Vent Mode: SIMV;PRVC;PSV FiO2 (%):  [30 %] 30 % Set Rate:  [6 bmp] 6 bmp Vt Set:  [500 mL] 500 mL PEEP:  [5 cmH20] 5 cmH20 Pressure Support:  [10 cmH20] 10 cmH20 Plateau Pressure:  [15 cmH20] 15 cmH20   Intake/Output Summary (Last 24 hours) at 06/30/2021 0837 Last data filed at 06/30/2021 0800 Gross per 24 hour  Intake 1849.94 ml  Output 1911 ml  Net -61.06 ml    Filed Weights   06/27/21 0500 06/29/21 0500 06/30/21 0500  Weight: 68.3 kg 73 kg 71.4 kg    General: Critically ill-appearing male, debated, no severe distress HEENT: MM pink/moist, ETT in place, pupils equal Neuro: Examined off sedation, patient slightly opening his eyes to voice, initially slightly moving fingers and toes to command, then more somnolent CV: s1s2 RRR, no m/r/g PULM: On full vent support, triggering breaths, pressures within goal, no severe rhonchi  or wheezing GI: soft, bsx4 active  Extremities: warm/dry, no edema  Skin: no rashes or lesions      Assessment & Plan:   Witnessed in-hospital PEA arrest 10/4, unclear etiology 20 minutes ACLS before ROSC, currently unclear etiology. K WNL prior to code. PE considered, but unlikely as Echo 10/2 without e/o R heart strain. - S/p TEE 10/4 without evidence of dissection P: -Continue supportive care, etiology remains unclear -Off pressors and sedation this morning -Neuro recovery seems to  have stalled, family met with palliative yesterday, plan to stop CRRT today and see if patient is going to be dialysis dependent, plan for further goals of care discussion with family      Acute encephalopathy post-PEA arrest, concern for anoxic brain injury.  MRI and EEG neg.   LP 10/8 neg though neuro checking with lab for some add on labs (IgG index, OCB) P: -Continue supine CVVHD, mental status not significantly improved so doubt uremia driving his continued encephalopathy -Appreciate neuro following for continued neuro prognostication  -Neuro considering workup of pituitary adenoma as outpatient   Acute hypoxic respiratory failure 2/2 PEA arrest Left Pneumothorax - secondary to CPR, s/p chest tube placement which has since been removed P:  -Mental status has been a barrier to SBT -Further goals of care discussion with family before offering tracheostomy, appreciate palliative involvement -titrate Vent setting to maintain SpO2 greater than or equal to 90%. -HOB elevated 30 degrees. -Plateau pressures less than 30 cm H20.  -Follow chest x-ray, ABG prn.   -Bronchial hygiene and RT/bronchodilator protocol.      Normocytic Anemia, Hemoglobin 6.5 this morning, no overt bleeding or hemodynamic instability last  P: -Transfuse 1 unit PRBCs, continue protonic    Acute kidney injury on chronic CKD stage IV, non-oliguric BPH NAGMA Hypophosphatemia - s/p repletion P: -Appreciate nephrology recommendations, stopping CVVHD today -Monitor urine output and serial metabolic panels -Follow electrolytes    Hypertension P: -Continue Norvasc,  PRN labetalol    Best Practice: (right click and "Reselect all SmartList Selections" daily)   Diet/type: NPO; TF  DVT prophylaxis: SCD GI prophylaxis: PPI Lines: N/A Foley:  N/A Code Status:  DNR Last date of multidisciplinary goals of care discussion: Family met with palliative 10/11    CRITICAL CARE Performed by: Otilio Carpen  Phillippe Orlick   Total critical care time: 35 minutes  Critical care time was exclusive of separately billable procedures and treating other patients.  Critical care was necessary to treat or prevent imminent or life-threatening deterioration.  Critical care was time spent personally by me on the following activities: development of treatment plan with patient and/or surrogate as well as nursing, discussions with consultants, evaluation of patient's response to treatment, examination of patient, obtaining history from patient or surrogate, ordering and performing treatments and interventions, ordering and review of laboratory studies, ordering and review of radiographic studies, pulse oximetry and re-evaluation of patient's condition.    Otilio Carpen Fanchon Papania, PA-C [facility] See Amion for pager If no response to pager , please call [phone] until 7pm After 7:00 pm call Elink  [phone]?[phone]

## 2021-06-30 NOTE — Progress Notes (Signed)
EEG complete - results pending 

## 2021-06-30 NOTE — Progress Notes (Deleted)
Patient Name: Scott Woods  MRN: [id number]  Epilepsy Attending: Lora Havens  Referring Physician/Provider: Dr Zeb Comfort Date: 06/30/2021 Duration: 42.19 mins  Patient history:  70 year old male status post cardiac arrest.  EEG to evaluate for seizures.   Level of alertness: Comatose   AEDs during EEG study: None   Technical aspects: This EEG study was done with scalp electrodes positioned according to the 10-20 International system of electrode placement. Electrical activity was acquired at a sampling rate of 500Hz  and reviewed with a high frequency filter of 70Hz  and a low frequency filter of 1Hz . EEG data were recorded continuously and digitally stored.    Description: EEG showed continuous generalized 3 to 5 Hz theta-delta slowing. Generalized periodic discharges with triphasic morphology at 1-1.5 Hz were also noted. Hyperventilation and photic stimulation were not performed.      ABNORMALITY -Periodic discharges with triphasic morphology, generalized -Continued slow, generalized   IMPRESSION: This study showed showed generalized periodic discharges with triphasic morphology at 1-1.5 Hz. This EEG pattern is on the ictal-interictal continuum.  However, the morphology of discharges, frequency as well as reactivity to stimulation can also be seen due to toxic-metabolic causes like renal failure, hyperammonemia, cefepime toxicity, anoxic-hypoxic brain injury.  Additionally there is severe diffuse encephalopathy. No definite seizures were seen during the study.  With h/o cardiac arrest, this eeg can be suggestive of anoxic-hypoxic brain injury.    [name] [name]

## 2021-06-30 NOTE — Progress Notes (Signed)
Subjective: No acute events overnight.  Continues to be minimally responsive.  ROS: Unable to obtain due to poor mental status  Examination  Vital signs in last 24 hours: Temp:  [97.9 F (36.6 C)-100 F (37.8 C)] 99.9 F (37.7 C) (10/12 1200) Pulse Rate:  [54-126] 125 (10/12 1200) Resp:  [16-33] 21 (10/12 1200) BP: (101-158)/(39-90) 158/59 (10/12 1200) SpO2:  [95 %-100 %] 100 % (10/12 1200) FiO2 (%):  [30 %] 30 % (10/12 1157) Weight:  [71.4 kg] 71.4 kg (10/12 0500)  General: lying in bed, NAD CVS: pulse-normal rate and rhythm RS: Intubated, CTAB Extremities: normal, warm Neuro: grimaces to noxious stimuli, PERRLA, corneal reflex intact, gag reflex intact, withdrawal with antigravity strength in all 4 extremities   Basic Metabolic Panel: Recent Labs  Lab 06/25/21 0449 06/26/21 0038 06/27/21 0524 06/27/21 2047 06/28/21 0539 06/28/21 1513 06/29/21 0438 06/29/21 1539 06/30/21 0318  NA 144   < >  --    < > 137 135 135 136 133*  K 4.8   < >  --    < > 5.0 4.7 4.8 4.5 4.8  CL 114*   < >  --    < > 104 102 102 100 100  CO2 19*   < >  --    < > 25 27 26 27 27   GLUCOSE 197*   < >  --    < > 232* 190* 240* 199* 217*  BUN 56*   < >  --    < > 39* 34* 26* 24* 26*  CREATININE 5.95*   < >  --    < > 2.08* 1.70* 1.50* 1.43* 1.36*  CALCIUM 8.9   < >  --    < > 8.1* 8.0* 8.1* 8.0* 8.1*  MG 2.4  --  2.5*  --  2.4  --  2.5*  --  2.6*  PHOS 5.1*  5.3*   < >  --    < > 2.8 1.9* 1.7* 3.3 2.5   < > = values in this interval not displayed.    CBC: Recent Labs  Lab 06/26/21 0904 06/27/21 0524 06/28/21 0832 06/29/21 0438 06/30/21 0318 06/30/21 0727  WBC 10.1 13.2* 16.7* 14.1* 13.7*  --   HGB 9.2* 9.5* 8.2* 7.0* 6.9* 6.5*  HCT 27.9* 29.7* 25.3* 22.0* 21.2* 20.4*  MCV 96.5 97.4 98.8 97.8 99.1  --   PLT 218 225 231 220 223  --      Coagulation Studies: No results for input(s): LABPROT, INR in the last 72 hours.  Imaging No new brain imaging  ASSESSMENT AND PLAN:   70 year old male status post in-hospital cardiac arrest on 06/22/2021 at 3:35 AM, 20 minutes to ROSC.   Cardiac arrest Acute encephalopathy -Encephalopathy likely due to AKI, hypoxic brain injury -Right more than left upper extremity stimulation to his myoclonus could be secondary to renal failure.  Less likely post anoxic myoclonus because it is typically not this only after cardiac arrest   Recommendations: -Even though MRI brain didn't show significant anoxic injury, I suspected patient has had some degree of hypoxic brain injury - Renal function improving with CRRT. Anticipate myoclonus will improve as renal function and mental status improves -Patient had in-hospital cardiac arrest, was able to open eyes initially but was not following commands.  However, there has not been any significant improvement in mental status over the last few days.  Unfortunately, there is not enough data for definite neuro prognosis in patients with  such scenario.  -  I repeated a routine EEG today which did not show any subclinical seizures that could explain patient's mental status.   -I will also check for the reversible causes of encephalopathy including TSH, ammonia, B12  -Given patient's age, comorbidities, neuro exam, EEG and MRI findings, if patient does not have significant neurological improvement (spontaneously opening eyes, tracking examiner, attempting to follow commands) over the next couple of weeks, I would anticipate minimal chances of meaningful neurological recovery after that -Goals of care discussion are ongoing with family -Minimize sedation, continue seizure precautions -As needed IV Ativan 2 mg for clinical seizure-like activity -Management of rest of comorbidities per primary team  I have spent a total of 25  minutes with the patient reviewing hospital notes,  test results, labs and examining the patient as well as establishing an assessment and plan that was discussed personally with Mickel Baas  Gleason.  > 50% of time was spent in direct patient care.    Zeb Comfort Epilepsy [facility] For questions after 5pm please refer to AMION to reach the Neurologist on call

## 2021-06-30 NOTE — Progress Notes (Signed)
Patient ID: Scott Woods, male   DOB: [birth year], 70 y.o.   MRN: [id number] [facility] Progress Note   Assessment/ Plan:   1. Acute kidney Injury on chronic kidney disease stage III (baseline creatinine 1.5 prior to severe AKI 5 months ago).  Oliguric overnight 400 cc urine output remains on CRRT with net even fluid balance.  With his current volume status on physical exam and labs, will discontinue CRRT today after he gets blood transfusion and monitor labs/clinical status expectantly.  He is off pressors. 2.  Status post in-hospital PEA arrest: Occurred on 10/4 with 20 minutes of ACLS before ROSC and etiology remains unclear.  Weaned off of pressors but remains ventilator dependent ability to protect airway with persistent encephalopathy. 3.  Acute encephalopathy: Status post PEA arrest with concern for hypoxic injury and seen by neurology.  Uremic/azotemic encephalopathy treated with CRRT but without corresponding improvement of mental status indicating the extent of hypoxic brain injury. 4.  Anemia: Acute hemoglobin and hematocrit drop noted again on labs this morning, PRBC transfusion ordered.  No overt loss  Subjective:   Tachycardic this morning with acute hemoglobin drop noted on labs.  He was changed to DNR CODE STATUS following palliative care meeting with family yesterday.  Weaned off pressors yesterday.   Objective:   BP 129/61   Pulse (!) 123   Temp 99.9 F (37.7 C)   Resp (!) 23   Ht _0  (1.727 m)   Wt 71.4 kg   SpO2 100%   BMI 23.93 kg/m   Intake/Output Summary (Last 24 hours) at 06/30/2021 8527 Last data filed at 06/30/2021 0700 Gross per 24 hour  Intake 1869.71 ml  Output 1950 ml  Net -80.29 ml   Weight change: -1.6 kg  Physical Exam: Gen: Intubated, sedated, unresponsive with arms flexed at elbows over chest CVS: Pulse regular rhythm, normal rate, S1 and S2 normal Resp: Clear to auscultation, no rales/rhonchi Abd: Soft, flat, nontender, bowel  sounds normal Ext: No lower extremity edema  Imaging: DG Chest Port 1 View  Result Date: 06/29/2021 CLINICAL DATA:  Respiratory failure, endotracheal intubation EXAM: PORTABLE CHEST 1 VIEW COMPARISON:  06/26/2021 FINDINGS: Unchanged support apparatus including endotracheal tube positioned below the thoracic inlet. Esophagogastric tube with tip and side port below the diaphragm. Right neck multi lumen vascular catheter, tip over the SVC. Cardiomegaly. Probable small, layering left pleural effusion. No acute airspace opacity. IMPRESSION: 1. Unchanged support apparatus. 2. Cardiomegaly. 3. Probable small, layering left pleural effusion. 4. No acute airspace opacity. Electronically Signed   By: Delanna Ahmadi M.D.   On: 06/29/2021 08:52    Labs: BMET Recent Labs  Lab 06/27/21 0502 06/27/21 2047 06/28/21 0539 06/28/21 1513 06/29/21 0438 06/29/21 1539 06/30/21 0318  NA 141 139 137 135 135 136 133*  K 4.8 5.2* 5.0 4.7 4.8 4.5 4.8  CL 107 104 104 102 102 100 100  CO2 _1 GLUCOSE 177* 151* 232* 190* 240* 199* 217*  BUN 42* 46* 39* 34* 26* 24* 26*  CREATININE 3.11* 2.69* 2.08* 1.70* 1.50* 1.43* 1.36*  CALCIUM 8.7* 8.3* 8.1* 8.0* 8.1* 8.0* 8.1*  PHOS 3.0 3.1 2.8 1.9* 1.7* 3.3 2.5   CBC Recent Labs  Lab 06/27/21 0524 06/28/21 0832 06/29/21 0438 06/30/21 0318  WBC 13.2* 16.7* 14.1* 13.7*  HGB 9.5* 8.2* 7.0* 6.9*  HCT 29.7* 25.3* 22.0* 21.2*  MCV 97.4 98.8 97.8 99.1  PLT 225 231 220 223  Medications:     acetaminophen  650 mg Oral Q4H   Or   acetaminophen (TYLENOL) oral liquid 160 mg/5 mL  650 mg Per Tube Q4H   Or   acetaminophen  650 mg Rectal Q4H   alteplase  2 mg Intracatheter Once   atorvastatin  40 mg Per Tube QHS   B-complex with vitamin C  1 tablet Per Tube Daily   bethanechol  10 mg Per Tube TID   chlorhexidine gluconate (MEDLINE KIT)  15 mL Mouth Rinse BID   Chlorhexidine Gluconate Cloth  6 each Topical Daily   docusate  100 mg Per Tube BID    feeding supplement (PROSource TF)  45 mL Per Tube QID   insulin aspart  0-9 Units Subcutaneous Q4H   lidocaine-EPINEPHrine  30 mL Other Once   mouth rinse  15 mL Mouth Rinse 10 times per day   pantoprazole (PROTONIX) IV  40 mg Intravenous Daily   polyethylene glycol  17 g Per Tube Daily   sodium chloride flush  10 mL Other Q8H   Elmarie Shiley, MD 06/30/2021, 7:38 AM

## 2021-06-30 NOTE — Progress Notes (Signed)
LTM hook up. [facility] notified. Test button tested.

## 2021-07-01 DIAGNOSIS — I469 Cardiac arrest, cause unspecified: Secondary | ICD-10-CM | POA: Diagnosis not present

## 2021-07-01 LAB — RENAL FUNCTION PANEL
Albumin: 1.7 g/dL — ABNORMAL LOW (ref 3.5–5.0)
Anion gap: 14 (ref 5–15)
BUN: 79 mg/dL — ABNORMAL HIGH (ref 8–23)
CO2: 22 mmol/L (ref 22–32)
Calcium: 8.3 mg/dL — ABNORMAL LOW (ref 8.9–10.3)
Chloride: 101 mmol/L (ref 98–111)
Creatinine, Ser: 3.33 mg/dL — ABNORMAL HIGH (ref 0.61–1.24)
GFR, Estimated: 19 mL/min — ABNORMAL LOW (ref >60)
Glucose, Bld: 224 mg/dL — ABNORMAL HIGH (ref 70–99)
Phosphorus: 7 mg/dL — ABNORMAL HIGH (ref 2.5–4.6)
Potassium: 4.7 mmol/L (ref 3.5–5.1)
Sodium: 137 mmol/L (ref 135–145)

## 2021-07-01 LAB — CBC
HCT: 22.4 % — ABNORMAL LOW (ref 39.0–52.0)
Hemoglobin: 7.3 g/dL — ABNORMAL LOW (ref 13.0–17.0)
MCH: 31.7 pg (ref 26.0–34.0)
MCHC: 32.6 g/dL (ref 30.0–36.0)
MCV: 97.4 fL (ref 80.0–100.0)
Platelets: 236 10*3/uL (ref 150–400)
RBC: 2.3 MIL/uL — ABNORMAL LOW (ref 4.22–5.81)
RDW: 14.9 % (ref 11.5–15.5)
WBC: 14.9 10*3/uL — ABNORMAL HIGH (ref 4.0–10.5)
nRBC: 0 % (ref 0.0–0.2)

## 2021-07-01 LAB — MAGNESIUM: Magnesium: 2.8 mg/dL — ABNORMAL HIGH (ref 1.7–2.4)

## 2021-07-01 LAB — COMPREHENSIVE METABOLIC PANEL
ALT: 40 U/L (ref 0–44)
AST: 43 U/L — ABNORMAL HIGH (ref 15–41)
Albumin: 1.8 g/dL — ABNORMAL LOW (ref 3.5–5.0)
Alkaline Phosphatase: 117 U/L (ref 38–126)
Anion gap: 12 (ref 5–15)
BUN: 65 mg/dL — ABNORMAL HIGH (ref 8–23)
CO2: 23 mmol/L (ref 22–32)
Calcium: 8.1 mg/dL — ABNORMAL LOW (ref 8.9–10.3)
Chloride: 100 mmol/L (ref 98–111)
Creatinine, Ser: 2.79 mg/dL — ABNORMAL HIGH (ref 0.61–1.24)
GFR, Estimated: 24 mL/min — ABNORMAL LOW (ref >60)
Glucose, Bld: 256 mg/dL — ABNORMAL HIGH (ref 70–99)
Potassium: 4.8 mmol/L (ref 3.5–5.1)
Sodium: 135 mmol/L (ref 135–145)
Total Bilirubin: 0.5 mg/dL (ref 0.3–1.2)
Total Protein: 5.6 g/dL — ABNORMAL LOW (ref 6.5–8.1)

## 2021-07-01 LAB — TYPE AND SCREEN
ABO/RH(D): O POS
Antibody Screen: NEGATIVE
Unit division: 0

## 2021-07-01 LAB — PHOSPHORUS: Phosphorus: 6 mg/dL — ABNORMAL HIGH (ref 2.5–4.6)

## 2021-07-01 LAB — GLUCOSE, CAPILLARY
Glucose-Capillary: 217 mg/dL — ABNORMAL HIGH (ref 70–99)
Glucose-Capillary: 231 mg/dL — ABNORMAL HIGH (ref 70–99)
Glucose-Capillary: 239 mg/dL — ABNORMAL HIGH (ref 70–99)
Glucose-Capillary: 227 mg/dL — ABNORMAL HIGH (ref 70–99)
Glucose-Capillary: 200 mg/dL — ABNORMAL HIGH (ref 70–99)

## 2021-07-01 LAB — BPAM RBC
Blood Product Expiration Date: 202211032359
ISSUE DATE / TIME: 202210121100
Unit Type and Rh: 5100

## 2021-07-01 MED ORDER — GLYCOPYRROLATE 1 MG PO TABS
1.0000 mg | ORAL_TABLET | Freq: Three times a day (TID) | ORAL | Status: DC
  Administered 2021-07-01 – 2021-07-02 (×4): 1 mg
  Filled 2021-07-01 (×6): qty 1

## 2021-07-01 MED ORDER — CHLORHEXIDINE GLUCONATE CLOTH 2 % EX PADS
6.0000 | MEDICATED_PAD | Freq: Every day | CUTANEOUS | Status: DC
  Administered 2021-07-02: 6 via TOPICAL

## 2021-07-01 NOTE — Progress Notes (Addendum)
Inpatient Diabetes Program Recommendations  AACE/ADA: New Consensus Statement on Inpatient Glycemic Control (2015)  Target Ranges:  Prepandial:   less than 140 mg/dL      Peak postprandial:   less than 180 mg/dL (1-2 hours)      Critically ill patients:  140 - 180 mg/dL   Lab Results  Component Value Date   GLUCAP 239 (H) 07/01/2021   HGBA1C 5.5 06/03/2021    Review of Glycemic Control Results for LEARY, MCNULTY" (MRN [id number]) as of 07/01/2021 11:55  Ref. Range 06/30/2021 23:39 07/01/2021 03:44 07/01/2021 07:42 07/01/2021 11:36  Glucose-Capillary Latest Ref Range: 70 - 99 mg/dL 207 (H) 217 (H) 231 (H) 239 (H)   Current orders for Inpatient glycemic control: Novolog 0-9 units Q4H, Vital @ 50 ml/hr   Inpatient Diabetes Program Recommendations:     Pending comfort care goal and if appropriate: Consider adding Novolog 3 units Q4H for tube feed coverage.   Thanks, Bronson Curb, MSN, RNC-OB Diabetes Coordinator [phone] (8a-5p)

## 2021-07-01 NOTE — Progress Notes (Signed)
EEG maintenance performed.  Noskine breakdown observed at electrode sites Fp1, Fp2.

## 2021-07-01 NOTE — Plan of Care (Signed)
Plan of Care:  I spoke with Pt's son Zenia Resides who has discussed next steps with several of patient's other family members and they are all in agreement that patient would not want long-term life support and would like to transition to comfort care.   Zenia Resides lives out of town and will let us known when he can be here to be with patient at the time of transition.   Otilio Carpen Dejon Jungman, PA-C

## 2021-07-01 NOTE — Plan of Care (Signed)
  Problem: Nutrition: Goal: Adequate nutrition will be maintained Outcome: Progressing   Problem: Coping: Goal: Level of anxiety will decrease Outcome: Progressing   Problem: Education: Goal: Knowledge of General Education information will improve Description: Including pain rating scale, medication(s)/side effects and non-pharmacologic comfort measures Outcome: Not Progressing   Problem: Health Behavior/Discharge Planning: Goal: Ability to manage health-related needs will improve Outcome: Not Progressing

## 2021-07-01 NOTE — Progress Notes (Signed)
LTM EEG discontinued - no skin breakdown at unhook.   

## 2021-07-01 NOTE — Progress Notes (Signed)
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 PMHx significant for HTN, HLD, T2DM, CKD stage IV, history of Hepatitis B, adrenal nodule, prostate CA.  On admission, patient denied any preceding symptoms prior to syncopal episode. CT Head was negative. Labs demonstrated Cr 6.2 (baseline 2.7) and K 6.3.  Hyperkalemia improved with Lokelma and insulin.  He was admitted to the hospitalist service.  Overnight 10/4, patient became agitated and hypertensive and was given Ativan and hydralazine.  A short time later he suffered a PEA arrest. Code Blue was called and ROSC obtained after 20 minutes CPR. Patient was intubated with post-intubation CXR showing L pneumothorax. Chest tube was placed by CCM.  Transferred to CVICU for further care.  Pertinent Medical History:   Past Medical History:  Diagnosis Date   Adrenal nodule ([place])    Blood in the urine 05/08/2015   Borderline diabetes    Chronic kidney disease    CKD (chronic kidney disease), stage II    Diabetes mellitus without complication (HCC)    Elevated PSA    Gross hematuria    H/O type B viral hepatitis 05/08/2015   When he was a child    History of hepatitis B    HTN (hypertension)    Hyperlipidemia with target LDL less than 100    Obesity    Pre-ulcerative calluses 03/28/2016   great toe    Prostate cancer ([place])    Significant Hospital Events: Including procedures, antibiotic start and stop dates in addition to other pertinent events   9/29 admitted to hospitalist service 10/4 PEA arrest, intubation, transfer to [place], left chest tube placed for post-code PTX. LTM EEG, c/f myoclonus. CT Head NAICA. Bedside TEE negative for dissection. 10/5 sedation turned  off 10/6 intermittently hypertensive, remains on LTM, sCr 5.4-> 5.8 but still making urine, remains off sedation, weaning on PSV 10/5 10/7 LTM d/c'd, stable sCr, improving UOP, remains off sedation, weaning 10/13 neuro exam without significant improvement despite stopping sedation, completing CRRT and now hemodynamically stable.  Ongoing goals of care discussions with family  Interim History / Subjective:  CRRT stopped yesterday, creatinine rising and 500 cc urine output all day yesterday.  No indication to dialyze today, but per nephrology labs do not indicate any renal recovery at this time  Objective:  Blood pressure (!) 153/71, pulse (!) 112, temperature 97.9 F (36.6 C), resp. rate 18, height $RemoveBe'5\' 8"'yAuUVJEJD$  (1.727 m), weight 69.2 kg, SpO2 100 %.    Vent Mode: SIMV;PRVC;PSV FiO2 (%):  [30 %] 30 % Set Rate:  [6 bmp] 6 bmp Vt Set:  [500 mL] 500 mL PEEP:  [5 cmH20] 5 cmH20 Pressure Support:  [10 cmH20] 10 cmH20 Plateau Pressure:  [13 cmH20-16 cmH20] 13 cmH20   Intake/Output Summary (Last 24 hours) at 07/01/2021 0844 Last data filed at 07/01/2021 0842 Gross per 24 hour  Intake 1199 ml  Output 620 ml  Net 579 ml    Filed Weights   06/29/21 0500 06/30/21 0500 07/01/21 0309  Weight: 73 kg 71.4 kg 69.2 kg    General: Critically ill appearing male, intubated, no severe distress HEENT: MM pink/moist, pupils equal and responsive, ETT in place Neuro: Off sedation, opens eyes to sternal rub, not  otherwise following commands today, withdraws to pain CV: s1s2 RRR, no m/r/g PULM: On full vent support, no significant rhonchi or wheezing, mildly decreased air entry bilateral bases GI: soft, bsx4 active  Extremities: warm/dry, no edema  Skin: no rashes or lesions    Assessment & Plan:   Witnessed in-hospital PEA arrest 10/4, unclear etiology 20 minutes ACLS before ROSC, currently unclear etiology. K WNL prior to code. PE considered, but unlikely as Echo 10/2 without e/o R heart strain.  S/p  TEE 10/4 without evidence of dissection P: -Continue supportive care, etiology remains unclear -Off pressors and sedation  -Concern for secondary anoxic brain injury  Acute encephalopathy post-PEA arrest, concern for anoxic brain injury.  MRI and EEG neg.   LP 10/8 neg though neuro checking with lab for some add on labs (IgG index, OCB) P: Neuro recovery seems to have stalled, repeat EEG without seizure activity, appreciate neurology following.  Checking TSH, ammonia, B12, if patient does not have significant neurological improvement over the next couple weeks neuro would anticipate minimal chances of meaningful neurologic recovery -Ongoing goals of care talked with family, appreciate palliative following    Acute hypoxic respiratory failure 2/2 PEA arrest Left Pneumothorax - secondary to CPR, s/p chest tube placement which has since been removed P:  -Mental status has been a barrier to SBT -Further goals of care discussion with family regarding tracheostomy, appreciate palliative involvement -titrate Vent setting to maintain SpO2 greater than or equal to 90%. -HOB elevated 30 degrees. -Plateau pressures less than 30 cm H20.  -Follow chest x-ray, ABG prn.   -Bronchial hygiene and RT/bronchodilator protocol.      Normocytic Anemia, Hemoglobin 6.5 this morning, no overt bleeding or hemodynamic instability last  P: -S/p 1 unit PRBCs yesterday, hemoglobin> 7 today, blood pressure stable, no evidence of bleed -Continue to follow CBC   Acute kidney injury on chronic CKD stage IV, non-oliguric BPH NAGMA Hypophosphatemia - s/p repletion P: -Appreciate nephrology recommendations, CRRT stopped yesterday, no evidence of significant renal recovery today -Continue to follow electrolytes, renal indices and urine output   Hypertension P: -Continue Norvasc,  PRN labetalol    Best Practice: (right click and "Reselect all SmartList Selections" daily)   Diet/type: NPO; TF  DVT  prophylaxis: SCD GI prophylaxis: PPI Lines: N/A Foley:  N/A Code Status:  DNR Last date of multidisciplinary goals of care discussion: Family met with palliative 10/11 Further goals of care discussion pending with son on 10/13    CRITICAL CARE Performed by: Otilio Carpen Gola Bribiesca   Total critical care time: 33 minutes  Critical care time was exclusive of separately billable procedures and treating other patients.  Critical care was necessary to treat or prevent imminent or life-threatening deterioration.  Critical care was time spent personally by me on the following activities: development of treatment plan with patient and/or surrogate as well as nursing, discussions with consultants, evaluation of patient's response to treatment, examination of patient, obtaining history from patient or surrogate, ordering and performing treatments and interventions, ordering and review of laboratory studies, ordering and review of radiographic studies, pulse oximetry and re-evaluation of patient's condition.    Otilio Carpen Sheron Robin, PA-C [facility] See Amion for pager If no response to pager , please call [phone] until 7pm After 7:00 pm call Elink  [phone]?[place]

## 2021-07-01 NOTE — Procedures (Addendum)
Patient Name: Scott Woods  MRN: [id number]  Epilepsy Attending: Lora Havens  Referring Physician/Provider: Dr Lynnae Sandhoff Duration: 06/30/2021 1058 to 07/01/2021 6116   Patient history:  70 year old male status post cardiac arrest.  EEG to evaluate for seizures.   Level of alertness: Comatose   AEDs during EEG study: None   Technical aspects: This EEG study was done with scalp electrodes positioned according to the 10-20 International system of electrode placement. Electrical activity was acquired at a sampling rate of 500Hz  and reviewed with a high frequency filter of 70Hz  and a low frequency filter of 1Hz . EEG data were recorded continuously and digitally stored.    Description: EEG showed continuous generalized 3 to 5 Hz theta-delta slowing. Generalized periodic discharges with triphasic morphology at 1-1.5 Hz were also noted, more when activated/stimulated. Hyperventilation and photic stimulation were not performed.      ABNORMALITY -Periodic discharges with triphasic morphology, generalized -Continued slow, generalized   IMPRESSION: This study showed showed generalized periodic discharges with triphasic morphology at 1-1.5 Hz. This EEG pattern is on the ictal-interictal continuum.  However, the morphology of discharges, frequency as well as reactivity to stimulation can also be seen due to toxic-metabolic causes like renal failure, hyperammonemia, cefepime toxicity, anoxic-hypoxic brain injury.  Additionally there is severe diffuse encephalopathy. No definite seizures were seen during the study.  With h/o cardiac arrest, this eeg can be suggestive of anoxic-hypoxic brain injury.    [name] [name]

## 2021-07-01 NOTE — Progress Notes (Signed)
Patient ID: Scott Woods, male   DOB: 06/17/1951, 70 y.o.   MRN: [id number] [facility] Progress Note   Assessment/ Plan:   1. Acute kidney Injury on chronic kidney disease stage III (baseline creatinine 1.5 prior to severe AKI 5 months ago).  Barely non-oliguric overnight with 520 cc urine output off CRRT and started on scheduled Lokelma for potassium control with labs yesterday evening showing borderline/rising potassium.  Labs do not indicate any renal recovery at this time but he does not have any indication to resume RRT.  Overall prognosis appears poor and family working towards making decisions on comfort care versus aggressive care measures while favoring the former. 2.  Status post in-hospital PEA arrest: Occurred on 10/4 with 20 minutes of ACLS before ROSC and etiology remains unclear.  Remains ventilator dependent with persistent encephalopathy. 3.  Acute encephalopathy: Status post PEA arrest with concern for hypoxic injury and seen by neurology.  Uremic/azotemic encephalopathy treated with CRRT but without corresponding improvement of mental status indicating the extent of hypoxic brain injury. 4.  Anemia: Acute hemoglobin and hematocrit drop noted again on labs this morning, PRBC transfusion ordered.  No overt loss  Subjective:   Without acute events overnight-CRRT discontinued yesterday midmorning and p.m. labs showed mild hyperkalemia 5.3.  Remains off pressors and with stable oxygen requirements   Objective:   BP (!) 153/71   Pulse (!) 112   Temp 97.9 F (36.6 C)   Resp 18   Ht _0  (1.727 m)   Wt 69.2 kg Comment: bed weight  SpO2 100%   BMI 23.20 kg/m   Intake/Output Summary (Last 24 hours) at 07/01/2021 4332 Last data filed at 07/01/2021 0600 Gross per 24 hour  Intake 1249 ml  Output 540 ml  Net 709 ml   Weight change: -2.2 kg  Physical Exam: Gen: Intubated, unresponsive CVS: Pulse regular rhythm, normal rate, S1 and S2 normal Resp: Clear to  auscultation, no rales/rhonchi Abd: Soft, flat, nontender, bowel sounds normal Ext: No lower extremity edema  Imaging: EEG adult  Result Date: 06/30/2021 Lora Havens, MD     06/30/2021  3:25 PM Patient Name: Scott Woods MRN: [id number] Epilepsy Attending: Lora Havens Referring Physician/Provider: Dr Zeb Comfort Date: 06/30/2021 Duration: 42.19 mins  Patient history:  70 year old male status post cardiac arrest.  EEG to evaluate for seizures.  Level of alertness: Comatose  AEDs during EEG study: None  Technical aspects: This EEG study was done with scalp electrodes positioned according to the 10-20 International system of electrode placement. Electrical activity was acquired at a sampling rate of _1  and reviewed with a high frequency filter of _2  and a low frequency filter of _3 . EEG data were recorded continuously and digitally stored.  Description: EEG showed continuous generalized 3 to 5 Hz theta-delta slowing. Generalized periodic discharges with triphasic morphology at 1-1.5 Hz were also noted. Hyperventilation and photic stimulation were not performed.    ABNORMALITY -Periodic discharges with triphasic morphology, generalized -Continued slow, generalized  IMPRESSION: This study showed showed generalized periodic discharges with triphasic morphology at 1-1.5 Hz. This EEG pattern is on the ictal-interictal continuum.  However, the morphology of discharges, frequency as well as reactivity to stimulation can also be seen due to toxic-metabolic causes like renal failure, hyperammonemia, cefepime toxicity, anoxic-hypoxic brain injury.  Additionally there is severe diffuse encephalopathy. No definite seizures were seen during the study.  With h/o cardiac arrest, this eeg can be suggestive of anoxic-hypoxic brain injury.  [place]: BMET Recent Labs  Lab 06/28/21 0539 06/28/21 1513 06/29/21 0438 06/29/21 1539 06/30/21 0318 06/30/21 1555 07/01/21 0402  NA 137  135 135 136 133* 135 135  K 5.0 4.7 4.8 4.5 4.8 5.3* 4.8  CL 104 102 102 100 100 101 100  CO2 _0 GLUCOSE 232* 190* 240* 199* 217* 231* 256*  BUN 39* 34* 26* 24* 26* 43* 65*  CREATININE 2.08* 1.70* 1.50* 1.43* 1.36* 2.13* 2.79*  CALCIUM 8.1* 8.0* 8.1* 8.0* 8.1* 8.2* 8.1*  PHOS 2.8 1.9* 1.7* 3.3 2.5 3.7 6.0*   CBC Recent Labs  Lab 06/28/21 0832 06/29/21 0438 06/30/21 0318 06/30/21 0727 06/30/21 1555 07/01/21 0402  WBC 16.7* 14.1* 13.7*  --   --  14.9*  HGB 8.2* 7.0* 6.9* 6.5* 7.9* 7.3*  HCT 25.3* 22.0* 21.2* 20.4* 24.6* 22.4*  MCV 98.8 97.8 99.1  --   --  97.4  PLT 231 220 223  --   --  236    Medications:     sodium chloride   Intravenous Once   acetaminophen  650 mg Oral Q4H   Or   acetaminophen (TYLENOL) oral liquid 160 mg/5 mL  650 mg Per Tube Q4H   Or   acetaminophen  650 mg Rectal Q4H   alteplase  2 mg Intracatheter Once   atorvastatin  40 mg Per Tube QHS   B-complex with vitamin C  1 tablet Per Tube Daily   bethanechol  10 mg Per Tube TID   chlorhexidine gluconate (MEDLINE KIT)  15 mL Mouth Rinse BID   [START ON Jul 10, 2021] Chlorhexidine Gluconate Cloth  6 each Topical Q0600   docusate  100 mg Per Tube BID   feeding supplement (PROSource TF)  45 mL Per Tube QID   insulin aspart  0-9 Units Subcutaneous Q4H   lidocaine-EPINEPHrine  30 mL Other Once   mouth rinse  15 mL Mouth Rinse 10 times per day   pantoprazole (PROTONIX) IV  40 mg Intravenous Daily   polyethylene glycol  17 g Per Tube Daily   sodium chloride flush  10 mL Other Q8H   sodium zirconium cyclosilicate  10 g Per Tube TID   Elmarie Shiley, MD 07/01/2021, 7:27 AM

## 2021-07-02 DIAGNOSIS — Z9911 Dependence on respirator [ventilator] status: Secondary | ICD-10-CM | POA: Diagnosis not present

## 2021-07-02 DIAGNOSIS — G931 Anoxic brain damage, not elsewhere classified: Secondary | ICD-10-CM

## 2021-07-02 DIAGNOSIS — G253 Myoclonus: Secondary | ICD-10-CM

## 2021-07-02 DIAGNOSIS — N183 Chronic kidney disease, stage 3 unspecified: Secondary | ICD-10-CM | POA: Diagnosis not present

## 2021-07-02 DIAGNOSIS — J9601 Acute respiratory failure with hypoxia: Secondary | ICD-10-CM | POA: Diagnosis not present

## 2021-07-02 DIAGNOSIS — N179 Acute kidney failure, unspecified: Secondary | ICD-10-CM | POA: Diagnosis not present

## 2021-07-02 LAB — CBC
HCT: 22.3 % — ABNORMAL LOW (ref 39.0–52.0)
Hemoglobin: 7.2 g/dL — ABNORMAL LOW (ref 13.0–17.0)
MCH: 31.6 pg (ref 26.0–34.0)
MCHC: 32.3 g/dL (ref 30.0–36.0)
MCV: 97.8 fL (ref 80.0–100.0)
Platelets: 266 10*3/uL (ref 150–400)
RBC: 2.28 MIL/uL — ABNORMAL LOW (ref 4.22–5.81)
RDW: 14.9 % (ref 11.5–15.5)
WBC: 14.9 10*3/uL — ABNORMAL HIGH (ref 4.0–10.5)
nRBC: 0 % (ref 0.0–0.2)

## 2021-07-02 LAB — RENAL FUNCTION PANEL
Albumin: 1.7 g/dL — ABNORMAL LOW (ref 3.5–5.0)
Anion gap: 14 (ref 5–15)
BUN: 96 mg/dL — ABNORMAL HIGH (ref 8–23)
CO2: 22 mmol/L (ref 22–32)
Calcium: 8.3 mg/dL — ABNORMAL LOW (ref 8.9–10.3)
Chloride: 102 mmol/L (ref 98–111)
Creatinine, Ser: 3.8 mg/dL — ABNORMAL HIGH (ref 0.61–1.24)
GFR, Estimated: 16 mL/min — ABNORMAL LOW (ref >60)
Glucose, Bld: 188 mg/dL — ABNORMAL HIGH (ref 70–99)
Phosphorus: 7.6 mg/dL — ABNORMAL HIGH (ref 2.5–4.6)
Potassium: 4.9 mmol/L (ref 3.5–5.1)
Sodium: 138 mmol/L (ref 135–145)

## 2021-07-02 LAB — GLUCOSE, CAPILLARY
Glucose-Capillary: 157 mg/dL — ABNORMAL HIGH (ref 70–99)
Glucose-Capillary: 167 mg/dL — ABNORMAL HIGH (ref 70–99)
Glucose-Capillary: 169 mg/dL — ABNORMAL HIGH (ref 70–99)
Glucose-Capillary: 191 mg/dL — ABNORMAL HIGH (ref 70–99)

## 2021-07-02 LAB — MAGNESIUM: Magnesium: 2.8 mg/dL — ABNORMAL HIGH (ref 1.7–2.4)

## 2021-07-02 MED ORDER — MIDAZOLAM-SODIUM CHLORIDE 100-0.9 MG/100ML-% IV SOLN
0.0000 mg/h | INTRAVENOUS | Status: DC
  Administered 2021-07-02: 0.5 mg/h via INTRAVENOUS
  Filled 2021-07-02: qty 100

## 2021-07-02 MED ORDER — GLYCOPYRROLATE 1 MG PO TABS
1.0000 mg | ORAL_TABLET | ORAL | Status: DC | PRN
  Filled 2021-07-02: qty 1

## 2021-07-02 MED ORDER — GLYCOPYRROLATE 0.2 MG/ML IJ SOLN: 0.2000 mg | INTRAMUSCULAR | Status: DC | PRN

## 2021-07-02 MED ORDER — SODIUM ZIRCONIUM CYCLOSILICATE 10 G PO PACK
10.0000 g | PACK | Freq: Every day | ORAL | Status: DC
  Administered 2021-07-02: 10 g
  Filled 2021-07-02: qty 1

## 2021-07-02 MED ORDER — INSULIN ASPART 100 UNIT/ML IJ SOLN
3.0000 [IU] | INTRAMUSCULAR | Status: DC
  Administered 2021-07-02 (×2): 3 [IU] via SUBCUTANEOUS

## 2021-07-02 MED ORDER — GLYCOPYRROLATE 0.2 MG/ML IJ SOLN
0.3000 mg | INTRAMUSCULAR | Status: DC
  Administered 2021-07-02: 0.3 mg via INTRAVENOUS
  Filled 2021-07-02 (×2): qty 2

## 2021-07-02 MED ORDER — HALOPERIDOL LACTATE 5 MG/ML IJ SOLN: 0.5000 mg | INTRAMUSCULAR | Status: DC | PRN

## 2021-07-02 MED ORDER — HEPARIN SODIUM (PORCINE) 5000 UNIT/ML IJ SOLN
5000.0000 [IU] | Freq: Three times a day (TID) | INTRAMUSCULAR | Status: DC
  Administered 2021-07-02 (×2): 5000 [IU] via SUBCUTANEOUS
  Filled 2021-07-02 (×2): qty 1

## 2021-07-02 MED ORDER — SODIUM CHLORIDE 0.9 % IV SOLN
1.0000 mg/h | INTRAVENOUS | Status: DC
  Administered 2021-07-02: 1 mg/h via INTRAVENOUS
  Filled 2021-07-02: qty 5

## 2021-07-02 MED ORDER — HALOPERIDOL LACTATE 2 MG/ML PO CONC
0.5000 mg | ORAL | Status: DC | PRN
  Filled 2021-07-02: qty 0.3

## 2021-07-02 MED ORDER — POLYVINYL ALCOHOL 1.4 % OP SOLN
1.0000 [drp] | Freq: Four times a day (QID) | OPHTHALMIC | Status: DC | PRN
  Filled 2021-07-02: qty 15

## 2021-07-02 MED ORDER — BIOTENE DRY MOUTH MT LIQD: 15.0000 mL | OROMUCOSAL | Status: DC | PRN

## 2021-07-02 MED ORDER — MIDAZOLAM HCL 2 MG/2ML IJ SOLN
2.0000 mg | INTRAMUSCULAR | Status: DC | PRN
  Administered 2021-07-02 (×3): 2 mg via INTRAVENOUS
  Filled 2021-07-02 (×3): qty 2

## 2021-07-02 MED ORDER — HALOPERIDOL 0.5 MG PO TABS
0.5000 mg | ORAL_TABLET | ORAL | Status: DC | PRN
  Filled 2021-07-02: qty 1

## 2021-07-02 MED ORDER — HYDROMORPHONE BOLUS VIA INFUSION
0.2500 mg | INTRAVENOUS | Status: DC | PRN
  Filled 2021-07-02: qty 1

## 2021-07-20 NOTE — Progress Notes (Signed)
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 PMHx significant for HTN, HLD, T2DM, CKD stage IV, history of Hepatitis B, adrenal nodule, prostate CA.  On admission, patient denied any preceding symptoms prior to syncopal episode. CT Head was negative. Labs demonstrated Cr 6.2 (baseline 2.7) and K 6.3.  Hyperkalemia improved with Lokelma and insulin.  He was admitted to the hospitalist service.  Overnight 10/4, patient became agitated and hypertensive and was given Ativan and hydralazine.  A short time later he suffered a PEA arrest. Code Blue was called and ROSC obtained after 20 minutes CPR. Patient was intubated with post-intubation CXR showing L pneumothorax. Chest tube was placed by CCM.  Transferred to CVICU for further care.  Pertinent Medical History:   Past Medical History:  Diagnosis Date   Adrenal nodule ([place])    Blood in the urine 05/08/2015   Borderline diabetes    Chronic kidney disease    CKD (chronic kidney disease), stage II    Diabetes mellitus without complication (HCC)    Elevated PSA    Gross hematuria    H/O type B viral hepatitis 05/08/2015   When he was a child    History of hepatitis B    HTN (hypertension)    Hyperlipidemia with target LDL less than 100    Obesity    Pre-ulcerative calluses 03/28/2016   great toe    Prostate cancer ([place])    Significant Hospital Events: Including procedures, antibiotic start and stop dates in addition to other pertinent events   9/29 admitted to hospitalist service 10/4 PEA arrest, intubation, transfer to [place], left chest tube placed for post-code PTX. LTM EEG, c/f myoclonus. CT Head NAICA. Bedside TEE negative for dissection. 10/5 sedation turned  off 10/6 intermittently hypertensive, remains on LTM, sCr 5.4-> 5.8 but still making urine, remains off sedation, weaning on PSV 10/5 10/7 LTM d/c'd, stable sCr, improving UOP, remains off sedation, weaning 10/13 neuro exam without significant improvement despite stopping sedation, completing CRRT and now hemodynamically stable.  Ongoing goals of care discussions with family  Interim History / Subjective:  Requiring frequent doses of midazolam to suppress twitching-- mostly arms, worse with stimulation.   Objective:  Blood pressure (!) 144/63, pulse (!) 110, temperature 98.9 F (37.2 C), temperature source Oral, resp. rate 18, height _0  (1.727 m), weight 65.7 kg, SpO2 99 %.    Vent Mode: SIMV;PSV;PRVC FiO2 (%):  [30 %] 30 % Set Rate:  [6 bmp] 6 bmp Vt Set:  [500 mL] 500 mL PEEP:  [5 cmH20] 5 cmH20 Pressure Support:  [10 cmH20] 10 cmH20 Plateau Pressure:  [13 cmH20-18 cmH20] 18 cmH20   Intake/Output Summary (Last 24 hours) at July 22, 2021 0715 Last data filed at 2021/07/22 0600 Gross per 24 hour  Intake 1550 ml  Output 1140 ml  Net 410 ml    Filed Weights   06/30/21 0500 07/01/21 0309 2021/07/22 0306  Weight: 71.4 kg 69.2 kg 65.7 kg    General: critically ill appearing man lying in bed in NAD, intubated, not sedated HEENT: [place]/AT, eyes anicteric, endotracheal tube in place Neuro: Examined off sedation drips.  No response to verbal stimulation.  Myoclonic twitching especially in left upper extremity with stimulation.  Intact pupillary and corneal reflexes, tracheal gag, pharyngeal cough.   CV: S1-S2, tachycardic, regular rhythm PULM: Synchronous with the vent, CTAB.  PEEP flat 15 GI: Soft, nontender, nondistended Extremities: No peripheral edema, no clubbing or cyanosis Skin: Warm and dry, no rashes   WBC 14.9 H/H 7.2/22.3  Assessment & Plan:   Witnessed in-hospital PEA arrest 10/4, unclear etiology 20 minutes ACLS before ROSC, currently unclear etiology. K+ WNL prior to  code. PE considered, but unlikely as Echo 10/2 without evidence of R heart strain. S/p TEE 10/4 without evidence of dissection.  There is significant concern for anoxic brain injury postarrest. P: -Continue supportive care, focusing on increasing his comfort. - Awaiting family coming to town. -[place] off pressors and sedation   Acute encephalopathy post-PEA arrest, concern for anoxic brain injury.  MRI WNL and EEG with diffuse abnormalities consistent with anoxic injury. TSH &ammonia WNL. LP 10/8 neg though neuro checking with lab for some add on labs (IgG index, OCB) Myoclonic jerking -If patient does not have significant neurological improvement over the next couple weeks neuro would anticipate minimal chances of meaningful neurologic recovery. -Ongoing discussions with family regarding his goals of care.  Family planning on tentatively withdrawing when son arrives from out of town. -Starting low-dose midazolam infusion given frequent PRN dose requirements.   Acute hypoxic respiratory failure 2/2 PEA arrest Left Pneumothorax - secondary to CPR, s/p chest tube placement which has since been removed -Low tidal volume ventilation, 4 to 8 cc/kg ideal body weight with goal plateaus and 30 driving pressure less than 15.  Meeting goals. - Mental status currently precludes extubation. - VAP prevention protocol - No need for sedation   Normocytic anemia due to kidney injury and critical illness -Continue to follow CBC periodically.  Limit phlebotomy station that is likely contributing - Transfuse for hemoglobin less than 7 or hemodynamically significant bleeding   Acute kidney injury on chronic CKD stage IV, non-oliguric BPH NAGMA Hypophosphatemia - s/p repletion P: -Appreciate nephrology's recommendations.  Would not recommend restarting CVVHD. - Continue Foley - Strict I's/O -Continue to follow electrolytes daily  Hypertension -Continue to monitor -PRN labetalol  Uncontrolled  hyperglycemia, diabetes - Adding tube feed coverage - Continue sliding scale - Goal BG 140-180   Best Practice: (right click and "Reselect all SmartList Selections" daily)   Diet/type: NPO; TF  DVT prophylaxis: prophylactic heparin  GI prophylaxis: PPI Lines: Dialysis Catheter Foley:  Yes, and it is still needed Code Status:  DNR Last date of multidisciplinary goals of care discussion: Family met with palliative 10/11, goals of care discussion pending with son on 10/13  This patient is critically ill with multiple organ system failure which requires frequent high complexity decision making, assessment, support, evaluation, and titration of therapies. This was completed through the application of advanced monitoring technologies and extensive interpretation of multiple databases. During this encounter critical care time was devoted to patient care services described in this note for 35 minutes.  Julian Hy, DO 2021-08-01 8:28 AM [facility]

## 2021-07-20 NOTE — Progress Notes (Signed)
Nephrology brief note: Chart reviewed and I have discussed with ICU physician including Dr. Tamala Julian and Dr. Carlis Abbott.  Patient is DNR and transitioning to comfort care when son arrives.  Nephrology team will be available if there is any question or concern. Please call us back if needed.  Katheran James, MD [facility].

## 2021-07-20 NOTE — Death Summary Note (Signed)
DEATH SUMMARY   Patient Details  Name: Scott Woods MRN: [id number] DOB: [birth year]  Admission/Discharge Information   Admit Date:  June 25, 2021  Date of Death: Date of Death: 07/10/2021  Time of Death: Time of Death: December 18, 1550  Length of Stay: Dec 10, 2022  Referring Physician: [name], PA-C   Reason(s) for Hospitalization  AKI on CKD 4   Diagnoses  Preliminary cause of death:  Anoxic brain injury  Secondary Diagnoses (including complications and co-morbidities):  Principal Problem:   Acute renal failure superimposed on stage 4 chronic kidney disease ([place]) Active Problems:   Hyperlipidemia LDL goal <100   Hypertension, goal below 140/90   Hyperkalemia   Syncope   Anemia due to chronic kidney disease   Fall   Cardiac arrest ([place])   Encephalopathy acute   Respiratory distress   Aspiration into airway   Respiratory failure ([place]) Anoxic brain injury Myoclonic twitching Left pneumothorax BPH Hypophosphatemia NAGMA HTN Diabetes with hyperglycemia  Brief Hospital Course (including significant findings, care, treatment, and services provided and events leading to death)  Scott Woods is a 70 y.o.  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 PMHx significant for HTN, HLD, T2DM, CKD stage IV, history of Hepatitis B, adrenal nodule, prostate CA.   On admission, patient denied any preceding symptoms prior to syncopal episode. CT Head was negative. Labs demonstrated Cr 6.2 (baseline 2.7) and K 6.3.  Hyperkalemia improved with Lokelma and insulin.  He was admitted to the hospitalist service.   Overnight 10/4, patient became agitated and hypertensive and was given Ativan and hydralazine.  A short time later he suffered a PEA arrest. Code Blue was called and ROSC obtained after 20 minutes CPR. Patient was intubated with post-intubation CXR showing L pneumothorax. Chest tube was placed by CCM.   Transferred to CVICU for further  care.  While in the ICU he failed to significantly improve neurologically despite sedation being held for several days. He remained in a coma without response to external stimulation. He was treated with CRRT for azotemia that could have been complicating his neurological exam. He received blood transfusions for acute blood loss anemia while in the ICU. Palliative care was involved with his care and ultimately his family decided to withdraw aggressive care measures to focus on comfort. He passed away with family at bedside on 10-Jul-2021 at 15:52.    Pertinent Labs and Studies  Significant Diagnostic Studies CT Abdomen Pelvis Wo Contrast  Result Date: 2021-06-25 CLINICAL DATA:  Abdominal trauma EXAM: CT ABDOMEN AND PELVIS WITHOUT CONTRAST TECHNIQUE: Multidetector CT imaging of the abdomen and pelvis was performed following the standard protocol without IV contrast. COMPARISON:  None. FINDINGS: LOWER CHEST: Normal. HEPATOBILIARY: Normal hepatic contours. No intra- or extrahepatic biliary dilatation. There is cholelithiasis without acute inflammation. PANCREAS: Normal pancreas. No ductal dilatation or peripancreatic fluid collection. SPLEEN: Normal. ADRENALS/URINARY TRACT: Unchanged appearance of small left adrenal adenoma. No hydronephrosis, nephroureterolithiasis or solid renal mass. The urinary bladder is normal for degree of distention STOMACH/BOWEL: There is no hiatal hernia. Normal duodenal course and caliber. No small bowel dilatation or inflammation. No focal colonic abnormality. Normal appendix. VASCULAR/LYMPHATIC: There is calcific atherosclerosis of the abdominal aorta. No lymphadenopathy. REPRODUCTIVE: Enlarged prostate measures 5.0 cm in transverse dimension. MUSCULOSKELETAL. No bony spinal canal stenosis or focal osseous abnormality. OTHER: None. IMPRESSION: 1. No acute abnormality of the abdomen or pelvis. 2. Unchanged appearance of small left adrenal adenoma. 3. Cholelithiasis without acute  inflammation. Aortic Atherosclerosis (ICD10-I70.0). Electronically Signed   By: Ulyses Jarred M.D.   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 No acute bone abnormality. Lungs are clear. No pneumothorax or pleural effusion. Cardiac size within normal limits. No superior mediastinal widening. Pulmonary vascularity is normal. IMPRESSION: No acute abnormality. Electronically Signed   By: Fidela Salisbury M.D.   On: 05/24/2021 22:17   CT HEAD WO CONTRAST (5MM)  Result Date: 06/22/2021 CLINICAL DATA:  Mental status change EXAM: CT HEAD WITHOUT CONTRAST TECHNIQUE: Contiguous axial images were obtained from the base of the skull through the vertex without intravenous contrast. COMPARISON:  CT head dated June 17, 2021 FINDINGS: Brain: No evidence of acute infarction, hemorrhage, hydrocephalus, extra-axial collection or mass lesion/mass effect. Vascular: No hyperdense vessel or unexpected calcification. Skull: Normal. Negative for fracture or focal lesion. Sinuses/Orbits: Opacification of the maxillary sinus and mucosal thickening of the ethmoid sinuses, similar to prior. Other: None. IMPRESSION: No acute intracranial abnormalities. Electronically Signed   By: Yetta Glassman M.D.   On: 06/22/2021 15:34   CT Head Wo Contrast  Result Date: 06/05/2021 CLINICAL DATA:  Head and neck trauma, fell EXAM: CT HEAD WITHOUT CONTRAST TECHNIQUE: Contiguous axial images were obtained from the base of the skull through the vertex without intravenous contrast. COMPARISON:  10/10/2019 FINDINGS: Brain: No acute infarct or hemorrhage. Lateral ventricles and midline structures are unremarkable. No acute extra-axial fluid collections. No mass effect. Vascular: No hyperdense vessel or  unexpected calcification. Skull: Normal. Negative for fracture or focal lesion. Sinuses/Orbits: Opacification of the right maxillary sinus. Remaining paranasal sinuses are clear. Other: None. IMPRESSION: 1. No acute intracranial process. 2. Right maxillary sinus disease. Electronically Signed   By: Randa Ngo M.D.   On: 06/12/2021 22:29   CT Cervical Spine Wo Contrast  Result Date: 05/21/2021 CLINICAL DATA:  Head and neck trauma, fell EXAM: CT CERVICAL SPINE WITHOUT CONTRAST TECHNIQUE: Multidetector CT imaging of the cervical spine was performed without intravenous contrast. Multiplanar CT image reconstructions were also generated. COMPARISON:  10/10/2019 FINDINGS: Alignment: Alignment is anatomic. Skull base and vertebrae: No acute fracture. No primary bone lesion or focal pathologic process. Soft tissues and spinal canal: No prevertebral fluid or swelling. No visible canal hematoma. Disc levels: Mild diffuse cervical spondylosis most pronounced at C4-5. Upper chest: Airway is patent.  Lung apices are clear. Other: Reconstructed images demonstrate no additional findings. IMPRESSION: 1. No acute cervical spine fracture. 2. Stable cervical spondylosis. Electronically Signed   By: Randa Ngo M.D.   On: 05/30/2021 22:34   MR BRAIN WO CONTRAST  Result Date: 06/25/2021 CLINICAL DATA:  Mental status change, persistent or worsening following cardiac arrest. EXAM: MRI HEAD WITHOUT CONTRAST TECHNIQUE: Multiplanar, multiecho pulse sequences of the brain and surrounding structures were obtained without intravenous contrast. COMPARISON:  Head CT June 22, 2021 FINDINGS: The study is partially degraded by motion. Brain: No acute infarction, hemorrhage, hydrocephalus, extra-axial collection or mass lesion. Vascular: Normal flow voids. Skull and upper cervical spine: Normal marrow signal. Sinuses/Orbits: The right maxillary sinus is opacified with T1 hyperintense content, may represent inspissated secretion. The  orbits are maintained. Other: None. IMPRESSION: No acute intracranial abnormality. Electronically Signed   By: Pedro Earls M.D.   On: 06/25/2021 13:57  MR CERVICAL SPINE WO CONTRAST  Result Date: 06/26/2021 CLINICAL DATA:  Myelopathy, acute or progressive. EXAM: MRI CERVICAL SPINE WITHOUT CONTRAST TECHNIQUE: Multiplanar, multisequence MR imaging of the cervical spine was performed. No intravenous contrast was administered. COMPARISON:  CT of the cervical spine 05/22/2021. FINDINGS: Mild intermittent motion degradation. Alignment: Mild cervical dextrocurvature, possibly positional. Straightening of the expected cervical lordosis. No significant spondylolisthesis. Vertebrae: Body height is maintained. No significant marrow edema or focal suspicious osseous lesion. C3 vertebral body hemangioma. Cord: No spinal cord signal abnormality is identified. Posterior Fossa, vertebral arteries, paraspinal tissues: Incompletely assessed T2 hyperintense focus within the posterior aspect of the sella turcica (series 3, image 7). Secretions within the nasopharynx and oropharynx. Partially visualized support tubes. Flow voids preserved within the imaged cervical vertebral arteries. Paraspinal soft tissues unremarkable. Disc levels: Moderate disc degeneration at C4-C5. No more than mild disc degeneration at the remaining levels. C2-C3: Small central disc protrusion. The disc protrusion results in mild focal effacement of the ventral thecal sac (without spinal cord mass effect). No significant foraminal stenosis. C3-C4: Bilateral uncovertebral hypertrophy. No significant spinal canal stenosis. Mild relative bilateral neural foraminal narrowing (greater on the left). C4-C5: Trace grade 1 retrolisthesis. Disc bulge with bilateral disc osteophyte ridge/uncinate hypertrophy. Superimposed small central disc protrusion. Mild facet arthrosis. Mild relative narrowing of the spinal canal (without spinal cord mass effect).  Moderate bilateral neural foraminal narrowing. C5-C6: Small central disc protrusion. Mild bilateral uncovertebral hypertrophy. Mild facet arthrosis. The disc protrusion results in mild focal effacement of the ventral thecal sac (without spinal cord mass effect). Bilateral neural foraminal narrowing (mild right, moderate left). C6-C7: Uncovertebral hypertrophy on the left. Minimal facet arthrosis. No significant disc herniation or spinal canal stenosis. Mild relative left neural foraminal narrowing. C7-T1: No significant disc herniation or stenosis. IMPRESSION: Mildly motion degraded examination. No appreciable signal abnormality within the cervical spinal cord. Cervical spondylosis, as outlined. No more than mild spinal canal narrowing (without spinal cord mass effect). Multilevel foraminal stenosis, greatest bilaterally at C4-C5 (moderate) and on the left at C5-C6 (moderate). Disc degeneration is greatest at C4-C5 (moderate at this level). Mild cervical dextrocurvature, possibly positional. Straightening of the expected cervical lordosis. Trace C4-C5 grade 1 retrolisthesis. Incompletely assessed T2 hyperintense focus within the posterior aspect of the sella turcica. This may reflect a developmental cyst. However, a pituitary adenoma cannot be excluded. Correlate with relevant endocrinologic laboratory values. Additionally, a contrast-enhanced brain MRI with pituitary protocol may be helpful for further evaluation. Electronically Signed   By: Kellie Simmering D.O.   On: 06/26/2021 18:04   MR THORACIC SPINE WO CONTRAST  Result Date: 06/26/2021 CLINICAL DATA:  Myelopathy, acute or progressive. EXAM: MRI THORACIC SPINE WITHOUT CONTRAST TECHNIQUE: Multiplanar, multisequence MR imaging of the thoracic spine was performed. No intravenous contrast was administered. COMPARISON:  Same-day MRI of the cervical spine. FINDINGS: Intermittently motion degraded examination, limiting evaluation. Most notably, there is moderate  motion degradation of the axial T2 GRE sequence. Alignment: Thoracic levocurvature. No significant spondylolisthesis. Vertebrae: No vertebral compression fracture. Mild multilevel degenerative endplate irregularity. No significant marrow edema or focal suspicious osseous lesion. Multilevel vertebral body hemangiomas. Cord: Within the limitations of motion degradation, no signal abnormality is identified within the spinal cord at the thoracic levels Paraspinal and other soft tissues: Bilateral pleural effusions. Signal abnormality in the dependent aspect of the thorax bilaterally, likely reflecting small effusions and mild atelectasis. Small T2 hyperintense focus within the liver, incompletely characterized but possibly reflecting a cyst. Paraspinal soft tissues unremarkable.  Disc levels: No more than mild disc degeneration within the thoracic spine. There are shallow multilevel disc bulges. At T7-T8, there is a tiny central disc protrusion without significant spinal canal stenosis (series 14, image 23). At T9-T10, there is a small central disc protrusion which focally effaces the ventral thecal sac resulting in mild relative spinal canal narrowing (without spinal cord mass effect) (series 14, image 28). No significant foraminal stenosis within the thoracic spine. IMPRESSION: Motion degraded examination, limiting evaluation. Within the limitations of motion degradation, no signal abnormality is identified within the spinal cord at the thoracic levels. Thoracic spondylosis, as outlined. Most notably, small disc protrusions at T7-T8 and T9-T10 result in no more than mild spinal canal narrowing (without spinal cord mass effect). No significant foraminal stenosis within the thoracic spine. Thoracic levocurvature. Signal abnormality within the dependent aspect of the thorax bilaterally, likely reflecting small pleural effusions and/or mild dependent atelectasis. Electronically Signed   By: Kellie Simmering D.O.   On:  06/26/2021 18:21   DG Chest Port 1 View  Result Date: 06/29/2021 CLINICAL DATA:  Respiratory failure, endotracheal intubation EXAM: PORTABLE CHEST 1 VIEW COMPARISON:  06/26/2021 FINDINGS: Unchanged support apparatus including endotracheal tube positioned below the thoracic inlet. Esophagogastric tube with tip and side port below the diaphragm. Right neck multi lumen vascular catheter, tip over the SVC. Cardiomegaly. Probable small, layering left pleural effusion. No acute airspace opacity. IMPRESSION: 1. Unchanged support apparatus. 2. Cardiomegaly. 3. Probable small, layering left pleural effusion. 4. No acute airspace opacity. Electronically Signed   By: Delanna Ahmadi M.D.   On: 06/29/2021 08:52   DG CHEST PORT 1 VIEW  Result Date: 06/26/2021 CLINICAL DATA:  Central line placement, encephalopathy EXAM: PORTABLE CHEST 1 VIEW COMPARISON:  06/26/2021 FINDINGS: Single frontal view of the chest demonstrates endotracheal tube overlying tracheal air column tip just below thoracic inlet. Enteric catheter tip and side port project over the gastric body. Right internal jugular catheter tip overlies superior vena cava. Cardiac silhouette is stable. No airspace disease, effusion, or pneumothorax. No acute bony abnormalities. Prior healed left rib fractures. IMPRESSION: 1. No complication after right internal jugular catheter placement. Support devices as above. 2. No acute airspace disease. Electronically Signed   By: Randa Ngo M.D.   On: 06/26/2021 16:28   DG CHEST PORT 1 VIEW  Result Date: 06/26/2021 CLINICAL DATA:  Respiratory distress EXAM: PORTABLE CHEST 1 VIEW COMPARISON:  06/22/2021 FINDINGS: Endotracheal tube is seen roughly 6 cm above the carina. Nasogastric tube extends into the upper abdomen beyond the margin of the examination. Left basilar pigtail chest tube is unchanged. Left basilar consolidation has improved with mild residual infiltrate noted. Right lung is clear. No pneumothorax or pleural  effusion. Mild cardiomegaly is stable. Pulmonary vascularity is normal. IMPRESSION: Stable support tubes. Left basilar chest tube in place.  No pneumothorax. Improving left basilar consolidation. Stable cardiomegaly. Electronically Signed   By: Fidela Salisbury M.D.   On: 06/26/2021 03:03   DG CHEST PORT 1 VIEW  Result Date: 06/22/2021 CLINICAL DATA:  70 year old male status post endotracheal tube placement. EXAM: PORTABLE CHEST 1 VIEW COMPARISON:  Chest x-ray 06/22/2021. FINDINGS: An endotracheal tube is in place with tip 6.9 cm above the carina. A nasogastric tube is seen extending into the stomach, however, the tip of the nasogastric tube extends below the lower margin of the image. New small bore left-sided chest tube projecting over the lower left hemithorax. Previously noted left-sided pneumothorax is no longer readily apparent. Lung volumes are  low. Patchy interstitial opacities and ill-defined airspace disease noted in the lungs bilaterally (left greater than right). No evidence of pulmonary edema. Heart size is normal. Mediastinal contours are grossly distorted by patient's rotation to the right. IMPRESSION: 1. Support apparatus, as above. 2. Resolution of previously noted left-sided pneumothorax following left-sided chest tube placement. 3. The appearance of the lungs is suggestive of multilobar bilateral bronchopneumonia (left greater than right), as above. Electronically Signed   By: Vinnie Langton M.D.   On: 06/22/2021 06:58   DG CHEST PORT 1 VIEW  Addendum Date: 06/22/2021   ADDENDUM REPORT: 06/22/2021 05:34 ADDENDUM: Critical Value/emergent results were called by telephone at the time of interpretation on 06/22/2021 at 0521 hours to [place], who verbally acknowledged these results. Electronically Signed   By: Genevie Ann M.D.   On: 06/22/2021 05:34   Result Date: 06/22/2021 CLINICAL DATA:  70 year old male intubated.  Aspiration. EXAM: PORTABLE CHEST 1 VIEW COMPARISON:  Chest radiographs  06/10/2021 and earlier. FINDINGS: Portable AP supine view at 0419 hours. Endotracheal tube tip in good position between the level the clavicles and carina. Enteric tube courses to the abdomen, tip not included. Stable lung volumes and mediastinal contours. Patchy and confluent new asymmetric bilateral pulmonary opacity, more pronounced on the left and with a perihilar predominance. Additionally there is asymmetric left costophrenic angle lucency. And a pleural edge is visible laterally on the left at the 3/4 rib interspace. No pneumothorax was identified last month. No superimposed pleural effusion.  No consolidation at this time. Stable visualized osseous structures. IMPRESSION: 1. Positive for Left-side Pneumothorax, at least Moderate on this supine view. Right side down lateral decubitus x-ray or Chest CT would best evaluate further. 2. Intubated. Endotracheal tube tip in good position. Enteric tube courses to the abdomen. 3. Patchy left greater than right bilateral perihilar opacity could be aspiration, atelectasis, or infection in this setting. Electronically Signed: By: Genevie Ann M.D. On: 06/22/2021 05:01   DG Abd Portable 1V  Result Date: 06/22/2021 CLINICAL DATA:  Encounter for orogastric tube placement EXAM: PORTABLE ABDOMEN - 1 VIEW COMPARISON:  None. FINDINGS: Gastric tube within the fundus of the stomach. Side port below the GE junction. Perihilar airspace disease suggests edema. IMPRESSION: Gastric tube in stomach. Electronically Signed   By: Suzy Bouchard M.D.   On: 06/22/2021 07:50   EEG adult  Result Date: 06/30/2021 Lora Havens, MD     06/30/2021  3:25 PM Patient Name: HARLON KUTNER MRN: [id number] Epilepsy Attending: Lora Havens Referring Physician/Provider: Dr Zeb Comfort Date: 06/30/2021 Duration: 42.19 mins  Patient history:  70 year old male status post cardiac arrest.  EEG to evaluate for seizures.  Level of alertness: Comatose  AEDs during EEG study: None  Technical  aspects: This EEG study was done with scalp electrodes positioned according to the 10-20 International system of electrode placement. Electrical activity was acquired at a sampling rate of 500Hz  and reviewed with a high frequency filter of 70Hz  and a low frequency filter of 1Hz . EEG data were recorded continuously and digitally stored.  Description: EEG showed continuous generalized 3 to 5 Hz theta-delta slowing. Generalized periodic discharges with triphasic morphology at 1-1.5 Hz were also noted. Hyperventilation and photic stimulation were not performed.    ABNORMALITY -Periodic discharges with triphasic morphology, generalized -Continued slow, generalized  IMPRESSION: This study showed showed generalized periodic discharges with triphasic morphology at 1-1.5 Hz. This EEG pattern is on the ictal-interictal continuum.  However, the morphology of discharges,  frequency as well as reactivity to stimulation can also be seen due to toxic-metabolic causes like renal failure, hyperammonemia, cefepime toxicity, anoxic-hypoxic brain injury.  Additionally there is severe diffuse encephalopathy. No definite seizures were seen during the study.  With h/o cardiac arrest, this eeg can be suggestive of anoxic-hypoxic brain injury.   Lora Havens    EEG adult  Result Date: 06/22/2021 Lora Havens, MD     06/23/2021  1:12 PM          ADDENDED REPORT Patient Name: Scott Woods MRN: [id number] Epilepsy Attending: Lora Havens Referring Physician/Provider: Elease Etienne, PA Date: 06/22/2021 Duration: 23.03 mins Patient history: 69 year old male status post cardiac arrest.  EEG to evaluate for seizures. Level of alertness: Comatose AEDs during EEG study: None Technical aspects: This EEG study was done with scalp electrodes positioned according to the 10-20 International system of electrode placement. Electrical activity was acquired at a sampling rate of 500Hz  and reviewed with a high frequency filter of 70Hz  and a low  frequency filter of 1Hz . EEG data were recorded continuously and digitally stored. Description: EEG showed continuous generalized background attenuation which was reactive to tactile stimulation.  Patient was also noted to have intermittent bilateral (right more than left) upper extremity jerking, more prominent when stimulated.  Concomitant EEG before, during and after the event did not show any EEG changes due to seizure.  Hyperventilation and photic stimulation were not performed.   ABNORMALITY -Background attenuation, generalized IMPRESSION: This study is suggestive of profound diffuse encephalopathy.  Patient was noted to have right more than left upper extremity jerking without concomitant EEG change.  This was most likely not an epileptic event.  Stimulation induced myoclonus is in the differential. [name], PA was initially notified.  Dr. Etta Quill was notified about the addended report. Priyanka [name]   Overnight EEG with video  Result Date: 07/01/2021 Lora Havens, MD     07/01/2021 12:39 PM Patient Name: Scott Woods MRN: [id number] Epilepsy Attending: Lora Havens Referring Physician/Provider: Dr Lynnae Sandhoff Duration: 06/30/2021 1058 to 07/01/2021 1601  Patient history:  70 year old male status post cardiac arrest.  EEG to evaluate for seizures.  Level of alertness: Comatose  AEDs during EEG study: None  Technical aspects: This EEG study was done with scalp electrodes positioned according to the 10-20 International system of electrode placement. Electrical activity was acquired at a sampling rate of 500Hz  and reviewed with a high frequency filter of 70Hz  and a low frequency filter of 1Hz . EEG data were recorded continuously and digitally stored.  Description: EEG showed continuous generalized 3 to 5 Hz theta-delta slowing. Generalized periodic discharges with triphasic morphology at 1-1.5 Hz were also noted, more when activated/stimulated. Hyperventilation and photic  stimulation were not performed.    ABNORMALITY -Periodic discharges with triphasic morphology, generalized -Continued slow, generalized  IMPRESSION: This study showed showed generalized periodic discharges with triphasic morphology at 1-1.5 Hz. This EEG pattern is on the ictal-interictal continuum.  However, the morphology of discharges, frequency as well as reactivity to stimulation can also be seen due to toxic-metabolic causes like renal failure, hyperammonemia, cefepime toxicity, anoxic-hypoxic brain injury.  Additionally there is severe diffuse encephalopathy. No definite seizures were seen during the study.  With h/o cardiac arrest, this eeg can be suggestive of anoxic-hypoxic brain injury.   Priyanka [name]    Overnight EEG with video  Result Date: 06/23/2021 Lora Havens, MD     06/23/2021 10:40 AM  Patient Name: Scott Woods MRN: [id number] Epilepsy Attending: Lora Havens Referring Physician/Provider: Elease Etienne, PA Duration: 06/22/2021 0949 to 06/23/2021 0949  Patient history: 70 year old male status post cardiac arrest.  EEG to evaluate for seizures.  Level of alertness: Comatose  AEDs during EEG study: None  Technical aspects: This EEG study was done with scalp electrodes positioned according to the 10-20 International system of electrode placement. Electrical activity was acquired at a sampling rate of 500Hz  and reviewed with a high frequency filter of 70Hz  and a low frequency filter of 1Hz . EEG data were recorded continuously and digitally stored.  Description: EEG initially showed near continuous generalized background attenuation which was reactive to tactile stimulation. Gradually  EEG evolved into intermittent generalized low amplitude 2 to 3 Hz delta slowing lasting 2 to 4 seconds admixed with generalized background attenuation lasting 2 to 4 seconds.  After around 1300 on 06/22/2021, EEG showed continuous generalized 3 to 5 Hz theta-delta slowing. Intermittent generalized periodic  discharges with triphasic morphology at 1.5 Hz were also noted, predominantly when awake/stimulated. Hyperventilation and photic stimulation were not performed.    ABNORMALITY -Periodic discharges with triphasic morphology, generalized -Continued slow, generalized  IMPRESSION: This study showed showed generalized aortic discharges with triphasic morphology at 1.5 Hz.  This EEG pattern is on the ictal-interictal continuum.  However, the morphology of discharges, frequency as well as reactivity to stimulation can also be seen due to toxic-metabolic causes.  No definite seizures were seen during the study.    Lora Havens   ECHOCARDIOGRAM COMPLETE  Result Date: 06/20/2021    ECHOCARDIOGRAM REPORT   Patient Name:   Scott Woods Date of Exam: 06/20/2021 Medical Rec #:  [id number]      Height:       70.0 in Accession #:    [id number]     Weight:       152.1 lb Date of Birth:  [birth year]      BSA:          1.858 m Patient Age:    55 years       BP:           145/91 mmHg Patient Gender: M              HR:           77 bpm. Exam Location:  Inpatient Procedure: 2D Echo, Cardiac Doppler and Color Doppler Indications:    Syncope  History:        Patient has no prior history of Echocardiogram examinations.                 Risk Factors:Hypertension and Dyslipidemia. CKD.  Sonographer:    Clayton Lefort RDCS (AE) Referring Phys: [id number] [place]  1. Left ventricular ejection fraction, by estimation, is 60 to 65%. The left ventricle has normal function. The left ventricle has no regional wall motion abnormalities. Left ventricular diastolic parameters were normal.  2. Right ventricular systolic function is normal. The right ventricular size is normal. There is normal pulmonary artery systolic pressure. The estimated right ventricular systolic pressure is 78.2 mmHg.  3. The mitral valve is normal in structure. Trivial mitral valve regurgitation. No evidence of mitral stenosis.  4. The aortic valve is  tricuspid. Aortic valve regurgitation is mild. Mild aortic valve sclerosis is present, with no evidence of aortic valve stenosis.  5. Aortic dilatation noted. There is moderate dilatation of the aortic root, measuring 45 mm.  6.  The inferior vena cava is dilated in size with >50% respiratory variability, suggesting right atrial pressure of 8 mmHg. FINDINGS  Left Ventricle: Left ventricular ejection fraction, by estimation, is 60 to 65%. The left ventricle has normal function. The left ventricle has no regional wall motion abnormalities. The left ventricular internal cavity size was normal in size. There is  no left ventricular hypertrophy. Left ventricular diastolic parameters were normal. Normal left ventricular filling pressure. Right Ventricle: The right ventricular size is normal. No increase in right ventricular wall thickness. Right ventricular systolic function is normal. There is normal pulmonary artery systolic pressure. The tricuspid regurgitant velocity is 2.09 m/s, and  with an assumed right atrial pressure of 8 mmHg, the estimated right ventricular systolic pressure is 83.4 mmHg. Left Atrium: Left atrial size was normal in size. Right Atrium: Right atrial size was normal in size. Pericardium: There is no evidence of pericardial effusion. Mitral Valve: The mitral valve is normal in structure. Trivial mitral valve regurgitation. No evidence of mitral valve stenosis. Tricuspid Valve: The tricuspid valve is normal in structure. Tricuspid valve regurgitation is trivial. No evidence of tricuspid stenosis. Aortic Valve: The aortic valve is tricuspid. Aortic valve regurgitation is mild. Aortic regurgitation PHT measures 312 msec. Mild aortic valve sclerosis is present, with no evidence of aortic valve stenosis. Aortic valve mean gradient measures 2.0 mmHg. Aortic valve peak gradient measures 4.2 mmHg. Aortic valve area, by VTI measures 2.82 cm. Pulmonic Valve: The pulmonic valve was normal in structure.  Pulmonic valve regurgitation is not visualized. No evidence of pulmonic stenosis. Aorta: Aortic dilatation noted. There is moderate dilatation of the aortic root, measuring 45 mm. Venous: The inferior vena cava is dilated in size with greater than 50% respiratory variability, suggesting right atrial pressure of 8 mmHg. IAS/Shunts: No atrial level shunt detected by color flow Doppler.  LEFT VENTRICLE PLAX 2D LVIDd:         4.50 cm  Diastology LVIDs:         3.10 cm  LV e' medial:    7.94 cm/s LV PW:         1.20 cm  LV E/e' medial:  12.7 LV IVS:        1.10 cm  LV e' lateral:   11.40 cm/s LVOT diam:     2.20 cm  LV E/e' lateral: 8.9 LV SV:         60 LV SV Index:   33 LVOT Area:     3.80 cm  RIGHT VENTRICLE            IVC RV Basal diam:  3.20 cm    IVC diam: 2.20 cm RV S prime:     9.68 cm/s TAPSE (M-mode): 1.8 cm LEFT ATRIUM             Index       RIGHT ATRIUM           Index LA diam:        3.20 cm 1.72 cm/m  RA Area:     12.60 cm LA Vol (A2C):   53.0 ml 28.52 ml/m RA Volume:   32.40 ml  17.44 ml/m LA Vol (A4C):   32.6 ml 17.54 ml/m LA Biplane Vol: 45.9 ml 24.70 ml/m  AORTIC VALVE AV Area (Vmax):    2.74 cm AV Area (Vmean):   2.82 cm AV Area (VTI):     2.82 cm AV Vmax:           102.00  cm/s AV Vmean:          65.000 cm/s AV VTI:            0.214 m AV Peak Grad:      4.2 mmHg AV Mean Grad:      2.0 mmHg LVOT Vmax:         73.60 cm/s LVOT Vmean:        48.300 cm/s LVOT VTI:          0.159 m LVOT/AV VTI ratio: 0.74 AI PHT:            312 msec  AORTA Ao Root diam: 4.50 cm MITRAL VALVE                TRICUSPID VALVE MV Area (PHT): 4.01 cm     TR Peak grad:   17.5 mmHg MV Decel Time: 189 msec     TR Vmax:        209.00 cm/s MV E velocity: 101.00 cm/s MV A velocity: 88.50 cm/s   SHUNTS MV E/A ratio:  1.14         Systemic VTI:  0.16 m                             Systemic Diam: 2.20 cm Fransico Him MD Electronically signed by Fransico Him MD Signature Date/Time: 06/20/2021/3:14:09 PM    Final    ECHO  TEE  Result Date: 06/22/2021    TRANSESOPHOGEAL ECHO REPORT   Patient Name:   Scott Woods Date of Exam: 06/22/2021 Medical Rec #:  [id number]      Height:       68.0 in Accession #:    [id number]     Weight:       156.1 lb Date of Birth:  Nov 11, 1950      BSA:          1.839 m Patient Age:    35 years       BP:           138/108 mmHg Patient Gender: M              HR:           110 bpm. Exam Location:  Inpatient Procedure: Transesophageal Echo, Cardiac Doppler and Color Doppler Indications:     Cardiac Arrest/ Rule out aortic dissection  History:         Patient has prior history of Echocardiogram examinations, most                  recent 06/22/2021.  Sonographer:     Merrie Roof RDCS Referring Phys:  [id number] Candee Furbish Diagnosing Phys: Oswaldo Milian MD PROCEDURE: After discussion of the risks and benefits of a TEE, an informed consent was obtained from the patient. The transesophogeal probe was passed without difficulty through the esophogus of the patient. Sedation performed by performing physician. The patient's vital signs; including heart rate, blood pressure, and oxygen saturation; remained stable throughout the procedure. The patient developed no complications during the procedure. IMPRESSIONS  1. Left ventricular ejection fraction, by estimation, is 60 to 65%. The left ventricle has normal function. The left ventricle has no regional wall motion abnormalities.  2. Right ventricular systolic function is normal. The right ventricular size is mildly enlarged.  3. No left atrial/left atrial appendage thrombus was detected.  4. The mitral valve is normal in structure. Trivial mitral valve regurgitation.  5. The aortic valve is tricuspid. Aortic valve regurgitation is mild. No aortic stenosis is present.  6. Aortic dilatation noted. Aneurysm of the aortic root, measuring 47 mm. There is dilatation of the ascending aorta, measuring 43 mm. No aortic dissection seen. FINDINGS  Left Ventricle: Left  ventricular ejection fraction, by estimation, is 60 to 65%. The left ventricle has normal function. The left ventricle has no regional wall motion abnormalities. The left ventricular internal cavity size was normal in size. Right Ventricle: The right ventricular size is mildly enlarged. No increase in right ventricular wall thickness. Right ventricular systolic function is normal. Left Atrium: Left atrial size was normal in size. No left atrial/left atrial appendage thrombus was detected. Right Atrium: Right atrial size was normal in size. Pericardium: There is no evidence of pericardial effusion. Mitral Valve: The mitral valve is normal in structure. Trivial mitral valve regurgitation. Tricuspid Valve: The tricuspid valve is normal in structure. Tricuspid valve regurgitation is mild. Aortic Valve: The aortic valve is tricuspid. Aortic valve regurgitation is mild. No aortic stenosis is present. Pulmonic Valve: The pulmonic valve was normal in structure. Pulmonic valve regurgitation is not visualized. Aorta: Aortic dilatation noted. There is dilatation of the ascending aorta, measuring 43 mm. There is an aneurysm involving the aortic root measuring 47 mm. IAS/Shunts: No atrial level shunt detected by color flow Doppler. Oswaldo Milian MD Electronically signed by Oswaldo Milian MD Signature Date/Time: 06/22/2021/8:39:27 PM    Final    VAS US CAROTID  Result Date: 06/20/2021 Carotid Arterial Duplex Study Patient Name:  Scott Woods  Date of Exam:   06/18/2021 Medical Rec #: [id number]       Accession #:    [id number] Date of Birth: [birth year]       Patient Gender: M Patient Age:   60 years Exam Location:  [facility] Procedure:      VAS US CAROTID Referring Phys: Babs Bertin --------------------------------------------------------------------------------  Indications:       Syncope. Risk Factors:      Hypertension, hyperlipidemia, Diabetes. Comparison Study:  no prior Performing Technologist:  Archie Patten RVS  Examination Guidelines: A complete evaluation includes B-mode imaging, spectral Doppler, color Doppler, and power Doppler as needed of all accessible portions of each vessel. Bilateral testing is considered an integral part of a complete examination. Limited examinations for reoccurring indications may be performed as noted.  Right Carotid Findings: +----------+--------+--------+--------+------------------+------------------+           PSV cm/sEDV cm/sStenosisPlaque DescriptionComments           +----------+--------+--------+--------+------------------+------------------+ CCA Prox  89      9                                 intimal thickening +----------+--------+--------+--------+------------------+------------------+ CCA Distal63      12                                intimal thickening +----------+--------+--------+--------+------------------+------------------+ ICA Prox  52      16      1-39%   heterogenous                         +----------+--------+--------+--------+------------------+------------------+ ICA Distal52      26                                                   +----------+--------+--------+--------+------------------+------------------+  ECA       67                      heterogenous                         +----------+--------+--------+--------+------------------+------------------+ +----------+--------+-------+----------------+-------------------+           PSV cm/sEDV cmsDescribe        Arm Pressure (mmHG) +----------+--------+-------+----------------+-------------------+ [id number]             Multiphasic, WNL                    +----------+--------+-------+----------------+-------------------+ +---------+--------+--+--------+--+---------+ VertebralPSV cm/s40EDV cm/s11Antegrade +---------+--------+--+--------+--+---------+  Left Carotid Findings:  +----------+--------+--------+--------+------------------+------------------+           PSV cm/sEDV cm/sStenosisPlaque DescriptionComments           +----------+--------+--------+--------+------------------+------------------+ CCA Prox  72      14                                intimal thickening +----------+--------+--------+--------+------------------+------------------+ CCA Distal70      14                                intimal thickening +----------+--------+--------+--------+------------------+------------------+ ICA Prox  91      19      1-39%   heterogenous                         +----------+--------+--------+--------+------------------+------------------+ ICA Distal43      16                                                   +----------+--------+--------+--------+------------------+------------------+ ECA       98      13              heterogenous                         +----------+--------+--------+--------+------------------+------------------+ +----------+--------+--------+----------------+-------------------+           PSV cm/sEDV cm/sDescribe        Arm Pressure (mmHG) +----------+--------+--------+----------------+-------------------+ [id number]              Multiphasic, WNL                    +----------+--------+--------+----------------+-------------------+ +---------+--------+--------+--------------+ VertebralPSV cm/sEDV cm/sNot identified +---------+--------+--------+--------------+   Summary: Right Carotid: Velocities in the right ICA are consistent with a 1-39% stenosis. Left Carotid: Velocities in the left ICA are consistent with a 1-39% stenosis. Vertebrals:  Right vertebral artery demonstrates antegrade flow. Left vertebral              artery was not visualized. Subclavians: Normal flow hemodynamics were seen in bilateral subclavian              arteries. *See table(s) above for measurements and observations.  Electronically  signed by Harold Barban MD on 06/20/2021 at 9:28:59 AM.    Final    ECHOCARDIOGRAM LIMITED  Result Date: 06/22/2021    ECHOCARDIOGRAM LIMITED REPORT   Patient Name:   Scott Woods Date of Exam: 06/22/2021 Medical Rec #:  [id number]  Height:       68.0 in Accession #:    [id number]     Weight:       156.1 lb Date of Birth:  Jan 30, 1951      BSA:          1.839 m Patient Age:    47 years       BP:           110/70 mmHg Patient Gender: M              HR:           105 bpm. Exam Location:  Inpatient Procedure: 2D Echo, Cardiac Doppler and Color Doppler Indications:    Cardiac arrest  History:        Patient has prior history of Echocardiogram examinations, most                 recent 06/20/2021. Signs/Symptoms:Syncope and Shortness of                 Breath; Risk Factors:Hypertension, Dyslipidemia and CKD.  Sonographer:    Merrie Roof RDCS Referring Phys: [id number] [place]  1. Left ventricular ejection fraction, by estimation, is 60 to 65%. The left ventricle has normal function. The left ventricle has no regional wall motion abnormalities. Left ventricular diastolic function could not be evaluated.  2. Right ventricular systolic function is normal. The right ventricular size is normal.  3. The pericardial effusion is anterior to the right ventricle.  4. No color flow or doppler. The mitral valve was not assessed. No evidence of mitral valve regurgitation. No evidence of mitral stenosis.  5. No color flow or doppler. The aortic valve was not assessed. Aortic valve regurgitation is not visualized. No aortic stenosis is present.  6. In setting of cardiac arrest concern for aortic dissection . Aortic dilatation noted. There is severe dilatation of the aortic root, measuring 47 mm.  7. The inferior vena cava is normal in size with greater than 50% respiratory variability, suggesting right atrial pressure of 3 mmHg. FINDINGS  Left Ventricle: Left ventricular ejection fraction, by estimation, is 60 to  65%. The left ventricle has normal function. The left ventricle has no regional wall motion abnormalities. The left ventricular internal cavity size was normal in size. There is  no left ventricular hypertrophy. Left ventricular diastolic function could not be evaluated. Right Ventricle: The right ventricular size is normal. No increase in right ventricular wall thickness. Right ventricular systolic function is normal. Left Atrium: Left atrial size was normal in size. Right Atrium: Right atrial size was normal in size. Pericardium: Trivial pericardial effusion is present. The pericardial effusion is anterior to the right ventricle. Mitral Valve: No color flow or doppler. The mitral valve was not assessed. No evidence of mitral valve stenosis. Tricuspid Valve: The tricuspid valve is not assessed. Tricuspid valve regurgitation is not demonstrated. No evidence of tricuspid stenosis. Aortic Valve: No color flow or doppler. The aortic valve was not assessed. Aortic valve regurgitation is not visualized. No aortic stenosis is present. Pulmonic Valve: The pulmonic valve was normal in structure. Pulmonic valve regurgitation is not visualized. No evidence of pulmonic stenosis. Aorta: In setting of cardiac arrest concern for aortic dissection. Aortic dilatation noted. There is severe dilatation of the aortic root, measuring 47 mm. Venous: The inferior vena cava is normal in size with greater than 50% respiratory variability, suggesting right atrial pressure of 3 mmHg. IAS/Shunts: No atrial level shunt detected by  color flow Doppler. Jenkins Rouge MD Electronically signed by Jenkins Rouge MD Signature Date/Time: 06/22/2021/11:16:05 AM    Final     Microbiology Recent Results (from the past 240 hour(s))  CSF culture w Gram Stain     Status: None   Collection Time: 06/26/21  2:50 PM   Specimen: CSF; Cerebrospinal Fluid  Result Value Ref Range Status   Specimen Description CSF  Final   Special Requests LP  Final   Gram  Stain   Final    WBC PRESENT, PREDOMINANTLY MONONUCLEAR NO ORGANISMS SEEN CYTOSPIN SMEAR    Culture   Final    NO GROWTH 3 DAYS Performed at [facility], [address] [place], [place] [address]    Report Status 06/29/2021 FINAL  Final    Lab Basic Metabolic Panel: Recent Labs  Lab 06/28/21 0539 06/28/21 1513 06/29/21 0438 06/29/21 1539 06/30/21 0318 06/30/21 1555 07/01/21 0402 07/01/21 1623 07/05/21 0400  NA 137   < > 135   < > 133* 135 135 137 138  K 5.0   < > 4.8   < > 4.8 5.3* 4.8 4.7 4.9  CL 104   < > 102   < > 100 101 100 101 102  CO2 25   < > 26   < > 27 24 23 22 22   GLUCOSE 232*   < > 240*   < > 217* 231* 256* 224* 188*  BUN 39*   < > 26*   < > 26* 43* 65* 79* 96*  CREATININE 2.08*   < > 1.50*   < > 1.36* 2.13* 2.79* 3.33* 3.80*  CALCIUM 8.1*   < > 8.1*   < > 8.1* 8.2* 8.1* 8.3* 8.3*  MG 2.4  --  2.5*  --  2.6*  --  2.8*  --  2.8*  PHOS 2.8   < > 1.7*   < > 2.5 3.7 6.0* 7.0* 7.6*   < > = values in this interval not displayed.   Liver Function Tests: Recent Labs  Lab 06/30/21 0318 06/30/21 1555 07/01/21 0402 07/01/21 1623 July 05, 2021 0400  AST  --   --  43*  --   --   ALT  --   --  40  --   --   ALKPHOS  --   --  117  --   --   BILITOT  --   --  0.5  --   --   PROT  --   --  5.6*  --   --   ALBUMIN 1.9* 1.9* 1.8* 1.7* 1.7*   No results for input(s): LIPASE, AMYLASE in the last 168 hours. Recent Labs  Lab 06/30/21 1342  AMMONIA 12   CBC: Recent Labs  Lab 06/28/21 0832 06/29/21 0438 06/30/21 0318 06/30/21 0727 06/30/21 1555 07/01/21 0402 07-05-2021 0400  WBC 16.7* 14.1* 13.7*  --   --  14.9* 14.9*  HGB 8.2* 7.0* 6.9* 6.5* 7.9* 7.3* 7.2*  HCT 25.3* 22.0* 21.2* 20.4* 24.6* 22.4* 22.3*  MCV 98.8 97.8 99.1  --   --  97.4 97.8  PLT 231 220 223  --   --  236 266   Cardiac Enzymes: No results for input(s): CKTOTAL, CKMB, CKMBINDEX, TROPONINI in the last 168 hours. Sepsis Labs: Recent Labs  Lab 06/29/21 0438 06/30/21 0318 07/01/21 0402  2021-07-05 0400  WBC 14.1* 13.7* 14.9* 14.9*    Procedures/Operations   Endotracheal intubation Temporary dialysis catheter  Lumbar puncture TTE  Chest tube placement Multiple EEGs  Julian Hy 2021/07/24, 4:21 PM

## 2021-07-20 NOTE — Progress Notes (Signed)
Daily Progress Note   Patient Name: Scott Woods       Date: 25-Jul-2021 DOB: Nov 03, 1950  Age: 70 y.o. MRN#: [id number] Attending Physician: Julian Hy, DO Primary Care Physician: [name], PA-C Admit Date: 06/16/2021 Length of Stay: 15 days  Reason for Consultation/Follow-up: Establishing goals of care  HPI/Patient Profile:  70 y.o. 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  CT of the head was negative on admission and labs demonstrated creatinine of 6.2 (baseline 2.7) and potassium of 6.3.  He was admitted for further evaluation and treatment.   Unfortunately on 10/4 the patient became agitated, hypertensive and later suffered a PEA arrest for which a CODE BLUE was called and ROSC obtained after 20 minutes of CPR.  The patient was intubated and chest x-ray showed left pneumothorax post compressions.  Chest tube was placed by CCM and he was transferred to CVICU for further care.  Currently etiology for the arrest is unclear.  He was started on CRRT.   PMT was consulted for goals of care.  Subjective:   Subjective: Chart Reviewed. Updates received. Patient Assessed. Created space and opportunity for patient  and family to explore thoughts and feelings regarding current medical situation.  Today's Discussion: I met with the patient's son Zenia Resides, his wife, and the patient's sister. Discussed current clinical situation of apparent anoxic brain injury, no expectation of meaningful neurologic outcome, and if ongoing aggressive care is desired this would likely involve a trach/PEG. Family was clear that the patient would not want this. I provided emotional and general support with therapeutic listening. I explained the options for aggressive care versus comfort care and what each of these would look like. Family decided on compassionate  extubation and shift to comfort focus. I explain the procedure and the goal for comfort and peace at passing. I explained that they patient may not pass away immediately and they verbalized understanding.  Offered spiritual care consult for support.  Review of Systems  Unable to perform ROS: Intubated   Objective:   Vital Signs:  BP (!) 142/69   Pulse (!) 113   Temp 98.6 F (37 C) (Oral)   Resp (!) 23   Ht $R'5\' 8"'bV$  (1.727 m)   Wt 65.7 kg   SpO2 100%   BMI 22.02 kg/m   Physical Exam: Physical Exam Vitals and nursing note reviewed.  Constitutional:      General: He is not in acute distress.    Appearance: He is ill-appearing and toxic-appearing.  HENT:     Head: Normocephalic and atraumatic.  Cardiovascular:     Rate and Rhythm: Tachycardia present.  Pulmonary:     Effort: No respiratory distress.     Breath sounds: No wheezing or rhonchi.  Abdominal:     General: Abdomen is flat.     Palpations: Abdomen is soft.  Neurological:     Mental Status: He is unresponsive.     Comments: Noted tremors    Palliative Assessment/Data: 10%   Assessment & Plan:   Impression: Present on Admission:  Hyperkalemia  Syncope  Hypertension, goal below 140/90  Hyperlipidemia LDL goal <100  Anemia due to chronic kidney disease  70 year old male with multiple chronic illnesses who comes in after a fall and subsequent in-hospital PEA cardiac arrest resulting in intubation, neurological compromise.  He has end-stage kidney disease and  is currently on CRRT.  All appearances at this time seem to indicate anoxic brain injury with unlikely improvement to be expected.  However, neurology has been asked to weigh in.  Patient's family has made him a DNR. Neurology feels unlikely significant further improvement. Decision was made for compassionate extubation and shift to comfort care. I assisted staff with shift to comfort and provided family support  SUMMARY OF RECOMMENDATIONS   Remain  DNR Compassionate extubation Shift to comfort care Spiritual care offered PMT will continue to support  Comfort medications: - Continue versed - Dilaudid gtt and boluses available q15 min - Haldol prn - Robinul q4 hours  Code Status: DNR  Prognosis: Hours - Days  Discharge Planning: Anticipated Hospital Death  Discussed with: Medical team, nursing team, respiratory therapist, patient's family, spiritual care  Thank you for allowing [place] to participate in the care of CRISTOVAL TEALL PMT will continue to support holistically.  Time Total: 150 min  Visit consisted of counseling and education dealing with the complex and emotionally intense issues of symptom management and palliative care in the setting of serious and potentially life-threatening illness. Greater than 50%  of this time was spent counseling and coordinating care related to the above assessment and plan.  Walden Field, NP Palliative Medicine Team  Team Phone # [phone] (Nights/Weekends)  05/18/2021, 8:17 AM

## 2021-07-20 NOTE — Procedures (Signed)
Extubation Procedure Note  Patient Details:   Name: Scott Woods DOB: [birth year] MRN: [id number]   Airway Documentation:    Vent end date: 08/01/2021 Vent end time: 1522   Evaluation  O2 sats: currently acceptable Complications: No apparent complications Patient did tolerate procedure well. Bilateral Breath Sounds: Rhonchi, Diminished   No  Pt extubated to comfort care measures per physician order and family request. Pt suctioned via ETT and orally prior, positive cuff leak. Pt extubated to room air. Family remains at bedside. RT will continue to be available as needed.   Sharla Kidney 2021/08/01, 3:25 PM

## 2021-07-20 NOTE — Progress Notes (Signed)
This chaplain responded to PMT consult for EOL spiritual care.  The chaplain introduced herself to the family and offered a spiritual care presence.  The Pt. family declined a spiritual care presence at this time.   Chaplain Sallyanne Kuster [phone]

## 2021-07-20 DEATH — deceased

## 2021-08-04 ENCOUNTER — Telehealth: Payer: Medicare (Managed Care)

## 2021-10-18 ENCOUNTER — Other Ambulatory Visit: Payer: Self-pay

## 2021-10-22 ENCOUNTER — Ambulatory Visit: Payer: Self-pay | Admitting: Urology

## 2022-05-05 ENCOUNTER — Ambulatory Visit: Payer: Medicare (Managed Care)
